# Patient Record
Sex: Male | Born: 1945 | Race: White | Hispanic: No | Marital: Married | Smoking: Former smoker
Health system: Southern US, Community
[De-identification: ages and names within clinical notes are randomized; demographics above are authoritative.]

## PROBLEM LIST (undated history)

## (undated) DIAGNOSIS — M5136 Other intervertebral disc degeneration, lumbar region: Secondary | ICD-10-CM

## (undated) DIAGNOSIS — G8929 Other chronic pain: Secondary | ICD-10-CM

## (undated) DIAGNOSIS — M47812 Spondylosis without myelopathy or radiculopathy, cervical region: Secondary | ICD-10-CM

## (undated) DIAGNOSIS — J984 Other disorders of lung: Secondary | ICD-10-CM

## (undated) DIAGNOSIS — M549 Dorsalgia, unspecified: Principal | ICD-10-CM

## (undated) DIAGNOSIS — I6529 Occlusion and stenosis of unspecified carotid artery: Secondary | ICD-10-CM

## (undated) DIAGNOSIS — F17201 Nicotine dependence, unspecified, in remission: Secondary | ICD-10-CM

## (undated) DIAGNOSIS — I4891 Unspecified atrial fibrillation: Secondary | ICD-10-CM

## (undated) DIAGNOSIS — M51369 Other intervertebral disc degeneration, lumbar region without mention of lumbar back pain or lower extremity pain: Secondary | ICD-10-CM

## (undated) DIAGNOSIS — M069 Rheumatoid arthritis, unspecified: Secondary | ICD-10-CM

## (undated) DIAGNOSIS — K449 Diaphragmatic hernia without obstruction or gangrene: Secondary | ICD-10-CM

## (undated) DIAGNOSIS — I2510 Atherosclerotic heart disease of native coronary artery without angina pectoris: Secondary | ICD-10-CM

## (undated) DIAGNOSIS — K220 Achalasia of cardia: Secondary | ICD-10-CM

## (undated) DIAGNOSIS — E039 Hypothyroidism, unspecified: Secondary | ICD-10-CM

## (undated) DIAGNOSIS — I679 Cerebrovascular disease, unspecified: Secondary | ICD-10-CM

## (undated) DIAGNOSIS — N200 Calculus of kidney: Secondary | ICD-10-CM

## (undated) DIAGNOSIS — Z8619 Personal history of other infectious and parasitic diseases: Secondary | ICD-10-CM

## (undated) DIAGNOSIS — J189 Pneumonia, unspecified organism: Secondary | ICD-10-CM

## (undated) DIAGNOSIS — I739 Peripheral vascular disease, unspecified: Secondary | ICD-10-CM

## (undated) DIAGNOSIS — I10 Essential (primary) hypertension: Secondary | ICD-10-CM

## (undated) DIAGNOSIS — E785 Hyperlipidemia, unspecified: Secondary | ICD-10-CM

## (undated) DIAGNOSIS — K219 Gastro-esophageal reflux disease without esophagitis: Secondary | ICD-10-CM

## (undated) DIAGNOSIS — K222 Esophageal obstruction: Secondary | ICD-10-CM

## (undated) HISTORY — DX: Nicotine dependence, unspecified, in remission: F17.201

## (undated) HISTORY — DX: Calculus of kidney: N20.0

## (undated) HISTORY — DX: Peripheral vascular disease, unspecified: I73.9

## (undated) HISTORY — PX: WRIST FUSION: SHX839

## (undated) HISTORY — DX: Personal history of other infectious and parasitic diseases: Z86.19

## (undated) HISTORY — PX: ANKLE FUSION: SHX881

## (undated) HISTORY — PX: SHOULDER SURGERY: SHX246

## (undated) HISTORY — DX: Unspecified atrial fibrillation: I48.91

## (undated) HISTORY — DX: Diaphragmatic hernia without obstruction or gangrene: K44.9

## (undated) HISTORY — DX: Gastro-esophageal reflux disease without esophagitis: K21.9

## (undated) HISTORY — DX: Achalasia of cardia: K22.0

## (undated) HISTORY — DX: Spondylosis without myelopathy or radiculopathy, cervical region: M47.812

## (undated) HISTORY — PX: CARDIAC SURGERY: SHX584

## (undated) HISTORY — DX: Occlusion and stenosis of unspecified carotid artery: I65.29

## (undated) HISTORY — DX: Hypothyroidism, unspecified: E03.9

## (undated) HISTORY — DX: Cerebrovascular disease, unspecified: I67.9

## (undated) HISTORY — PX: CORONARY ARTERY BYPASS GRAFT: SHX141

## (undated) HISTORY — DX: Hyperlipidemia, unspecified: E78.5

## (undated) HISTORY — DX: Other disorders of lung: J98.4

## (undated) HISTORY — DX: Essential (primary) hypertension: I10

## (undated) HISTORY — DX: Atherosclerotic heart disease of native coronary artery without angina pectoris: I25.10

## (undated) HISTORY — DX: Rheumatoid arthritis, unspecified: M06.9

## (undated) HISTORY — PX: TOTAL KNEE ARTHROPLASTY: SHX125

## (undated) HISTORY — DX: Esophageal obstruction: K22.2

## (undated) SURGERY — EGD (ESOPHAGOGASTRODUODENOSCOPY)
Anesthesia: Moderate Sedation

---

## 1997-09-23 HISTORY — PX: JOINT REPLACEMENT: SHX530

## 1998-02-09 ENCOUNTER — Inpatient Hospital Stay (HOSPITAL_COMMUNITY): Admission: RE | Admit: 1998-02-09 | Discharge: 1998-02-14 | Payer: Self-pay | Admitting: Orthopedic Surgery

## 1998-02-17 ENCOUNTER — Other Ambulatory Visit: Admission: RE | Admit: 1998-02-17 | Discharge: 1998-02-17 | Payer: Self-pay | Admitting: Orthopedic Surgery

## 1998-02-23 ENCOUNTER — Other Ambulatory Visit: Admission: RE | Admit: 1998-02-23 | Discharge: 1998-02-23 | Payer: Self-pay | Admitting: Orthopedic Surgery

## 1998-03-02 ENCOUNTER — Other Ambulatory Visit: Admission: RE | Admit: 1998-03-02 | Discharge: 1998-03-02 | Payer: Self-pay | Admitting: Orthopedic Surgery

## 1998-04-17 ENCOUNTER — Ambulatory Visit (HOSPITAL_COMMUNITY): Admission: RE | Admit: 1998-04-17 | Discharge: 1998-04-17 | Payer: Self-pay | Admitting: Interventional Cardiology

## 2001-12-22 ENCOUNTER — Ambulatory Visit (HOSPITAL_COMMUNITY): Admission: RE | Admit: 2001-12-22 | Discharge: 2001-12-22 | Payer: Self-pay | Admitting: Internal Medicine

## 2003-06-30 ENCOUNTER — Encounter: Payer: Self-pay | Admitting: Family Medicine

## 2003-06-30 ENCOUNTER — Ambulatory Visit (HOSPITAL_COMMUNITY): Admission: RE | Admit: 2003-06-30 | Discharge: 2003-06-30 | Payer: Self-pay | Admitting: Family Medicine

## 2005-10-07 ENCOUNTER — Encounter: Admission: RE | Admit: 2005-10-07 | Discharge: 2005-10-07 | Payer: Self-pay | Admitting: Rheumatology

## 2005-12-23 ENCOUNTER — Ambulatory Visit: Payer: Self-pay | Admitting: Cardiology

## 2005-12-23 ENCOUNTER — Inpatient Hospital Stay (HOSPITAL_COMMUNITY): Admission: EM | Admit: 2005-12-23 | Discharge: 2005-12-25 | Payer: Self-pay | Admitting: Emergency Medicine

## 2007-01-20 ENCOUNTER — Ambulatory Visit (HOSPITAL_COMMUNITY): Admission: RE | Admit: 2007-01-20 | Discharge: 2007-01-20 | Payer: Self-pay | Admitting: Family Medicine

## 2007-08-31 ENCOUNTER — Ambulatory Visit: Payer: Self-pay | Admitting: Cardiology

## 2007-09-10 ENCOUNTER — Emergency Department (HOSPITAL_COMMUNITY): Admission: EM | Admit: 2007-09-10 | Discharge: 2007-09-10 | Payer: Self-pay | Admitting: Emergency Medicine

## 2007-10-08 ENCOUNTER — Ambulatory Visit (HOSPITAL_COMMUNITY): Admission: RE | Admit: 2007-10-08 | Discharge: 2007-10-08 | Payer: Self-pay | Admitting: Family Medicine

## 2007-10-29 ENCOUNTER — Ambulatory Visit (HOSPITAL_COMMUNITY): Admission: RE | Admit: 2007-10-29 | Discharge: 2007-10-29 | Payer: Self-pay | Admitting: Cardiology

## 2007-10-29 ENCOUNTER — Ambulatory Visit: Payer: Self-pay | Admitting: Cardiology

## 2007-11-02 ENCOUNTER — Inpatient Hospital Stay (HOSPITAL_BASED_OUTPATIENT_CLINIC_OR_DEPARTMENT_OTHER): Admission: RE | Admit: 2007-11-02 | Discharge: 2007-11-02 | Payer: Self-pay | Admitting: Cardiology

## 2007-11-02 ENCOUNTER — Ambulatory Visit: Payer: Self-pay | Admitting: Cardiology

## 2007-11-09 ENCOUNTER — Ambulatory Visit: Payer: Self-pay | Admitting: Surgery

## 2007-11-17 ENCOUNTER — Ambulatory Visit: Payer: Self-pay | Admitting: Surgery

## 2007-11-17 ENCOUNTER — Ambulatory Visit (HOSPITAL_COMMUNITY): Admission: RE | Admit: 2007-11-17 | Discharge: 2007-11-17 | Payer: Self-pay | Admitting: Surgery

## 2007-11-26 ENCOUNTER — Ambulatory Visit: Payer: Self-pay | Admitting: Cardiology

## 2007-11-30 ENCOUNTER — Ambulatory Visit: Payer: Self-pay | Admitting: Surgery

## 2007-12-14 ENCOUNTER — Ambulatory Visit: Payer: Self-pay | Admitting: Surgery

## 2008-01-26 ENCOUNTER — Ambulatory Visit: Payer: Self-pay | Admitting: Cardiology

## 2008-02-03 ENCOUNTER — Ambulatory Visit (HOSPITAL_COMMUNITY): Admission: RE | Admit: 2008-02-03 | Discharge: 2008-02-03 | Payer: Self-pay | Admitting: Cardiology

## 2008-02-03 ENCOUNTER — Encounter: Payer: Self-pay | Admitting: Cardiology

## 2008-02-23 ENCOUNTER — Ambulatory Visit: Payer: Self-pay | Admitting: Cardiology

## 2008-03-04 ENCOUNTER — Ambulatory Visit: Payer: Self-pay | Admitting: Cardiology

## 2008-03-05 ENCOUNTER — Emergency Department (HOSPITAL_COMMUNITY): Admission: EM | Admit: 2008-03-05 | Discharge: 2008-03-05 | Payer: Self-pay | Admitting: Emergency Medicine

## 2008-03-05 ENCOUNTER — Encounter: Payer: Self-pay | Admitting: Critical Care Medicine

## 2008-03-07 ENCOUNTER — Ambulatory Visit: Payer: Self-pay | Admitting: Critical Care Medicine

## 2008-03-07 DIAGNOSIS — E785 Hyperlipidemia, unspecified: Secondary | ICD-10-CM

## 2008-03-07 DIAGNOSIS — J309 Allergic rhinitis, unspecified: Secondary | ICD-10-CM | POA: Insufficient documentation

## 2008-03-14 ENCOUNTER — Telehealth: Payer: Self-pay | Admitting: Critical Care Medicine

## 2008-03-15 ENCOUNTER — Telehealth: Payer: Self-pay | Admitting: Critical Care Medicine

## 2008-03-23 ENCOUNTER — Encounter: Payer: Self-pay | Admitting: Critical Care Medicine

## 2008-03-23 ENCOUNTER — Ambulatory Visit: Payer: Self-pay | Admitting: Critical Care Medicine

## 2008-04-04 ENCOUNTER — Encounter: Payer: Self-pay | Admitting: Critical Care Medicine

## 2008-04-11 ENCOUNTER — Ambulatory Visit: Payer: Self-pay | Admitting: Critical Care Medicine

## 2008-05-05 ENCOUNTER — Encounter: Payer: Self-pay | Admitting: Critical Care Medicine

## 2008-06-27 ENCOUNTER — Ambulatory Visit: Payer: Self-pay | Admitting: Surgery

## 2008-07-20 ENCOUNTER — Ambulatory Visit: Payer: Self-pay | Admitting: Critical Care Medicine

## 2008-07-27 ENCOUNTER — Encounter: Payer: Self-pay | Admitting: Critical Care Medicine

## 2008-09-23 DIAGNOSIS — N200 Calculus of kidney: Secondary | ICD-10-CM

## 2008-09-23 HISTORY — DX: Calculus of kidney: N20.0

## 2008-09-26 ENCOUNTER — Encounter: Payer: Self-pay | Admitting: Internal Medicine

## 2008-12-15 ENCOUNTER — Ambulatory Visit: Payer: Self-pay | Admitting: Cardiology

## 2009-05-11 ENCOUNTER — Ambulatory Visit: Payer: Self-pay | Admitting: Urology

## 2009-07-10 ENCOUNTER — Ambulatory Visit: Payer: Self-pay | Admitting: Surgery

## 2009-12-01 ENCOUNTER — Encounter (INDEPENDENT_AMBULATORY_CARE_PROVIDER_SITE_OTHER): Payer: Self-pay | Admitting: *Deleted

## 2009-12-01 ENCOUNTER — Encounter: Payer: Self-pay | Admitting: Cardiology

## 2009-12-01 LAB — CONVERTED CEMR LAB
ALT: 39 units/L
AST: 38 units/L
Alkaline Phosphatase: 53 units/L
BUN: 16 mg/dL
CO2: 23 meq/L
Cholesterol: 185 mg/dL
Glucose, Bld: 96 mg/dL
HDL: 52 mg/dL
Potassium: 3.4 meq/L
Sodium: 142 meq/L

## 2009-12-04 ENCOUNTER — Encounter (INDEPENDENT_AMBULATORY_CARE_PROVIDER_SITE_OTHER): Payer: Self-pay | Admitting: *Deleted

## 2009-12-04 ENCOUNTER — Encounter: Payer: Self-pay | Admitting: Cardiology

## 2009-12-04 LAB — CONVERTED CEMR LAB
ALT: 39 units/L (ref 0–53)
AST: 38 units/L — ABNORMAL HIGH (ref 0–37)
Albumin: 4.3 g/dL (ref 3.5–5.2)
Alkaline Phosphatase: 53 units/L (ref 39–117)
BUN: 16 mg/dL (ref 6–23)
CO2: 23 meq/L (ref 19–32)
Calcium: 9.5 mg/dL (ref 8.4–10.5)
Chloride: 107 meq/L (ref 96–112)
Glucose, Bld: 96 mg/dL (ref 70–99)
Potassium: 3.4 meq/L — ABNORMAL LOW (ref 3.5–5.3)
VLDL: 66 mg/dL — ABNORMAL HIGH (ref 0–40)

## 2010-01-24 ENCOUNTER — Encounter (INDEPENDENT_AMBULATORY_CARE_PROVIDER_SITE_OTHER): Payer: Self-pay | Admitting: *Deleted

## 2010-01-24 LAB — CONVERTED CEMR LAB
GFR calc non Af Amer: 9.5 mL/min
Glucose, Bld: 176 mg/dL — ABNORMAL HIGH (ref 70–99)
Potassium: 4.9 meq/L
Potassium: 4.9 meq/L (ref 3.5–5.3)
Sodium: 142 meq/L
Sodium: 142 meq/L (ref 135–145)

## 2010-01-26 ENCOUNTER — Inpatient Hospital Stay (HOSPITAL_COMMUNITY): Admission: EM | Admit: 2010-01-26 | Discharge: 2010-01-27 | Payer: Self-pay | Admitting: Emergency Medicine

## 2010-01-29 ENCOUNTER — Encounter (INDEPENDENT_AMBULATORY_CARE_PROVIDER_SITE_OTHER): Payer: Self-pay | Admitting: *Deleted

## 2010-02-06 ENCOUNTER — Encounter (INDEPENDENT_AMBULATORY_CARE_PROVIDER_SITE_OTHER): Payer: Self-pay | Admitting: *Deleted

## 2010-03-12 ENCOUNTER — Encounter (INDEPENDENT_AMBULATORY_CARE_PROVIDER_SITE_OTHER): Payer: Self-pay | Admitting: *Deleted

## 2010-03-18 ENCOUNTER — Encounter: Payer: Self-pay | Admitting: Cardiology

## 2010-03-18 DIAGNOSIS — I739 Peripheral vascular disease, unspecified: Secondary | ICD-10-CM

## 2010-03-18 DIAGNOSIS — K219 Gastro-esophageal reflux disease without esophagitis: Secondary | ICD-10-CM

## 2010-03-18 DIAGNOSIS — E039 Hypothyroidism, unspecified: Secondary | ICD-10-CM

## 2010-03-18 DIAGNOSIS — I2510 Atherosclerotic heart disease of native coronary artery without angina pectoris: Secondary | ICD-10-CM

## 2010-03-19 ENCOUNTER — Ambulatory Visit: Payer: Self-pay | Admitting: Cardiology

## 2010-04-19 ENCOUNTER — Ambulatory Visit: Payer: Self-pay | Admitting: Cardiology

## 2010-04-23 ENCOUNTER — Encounter: Payer: Self-pay | Admitting: Cardiology

## 2010-04-30 ENCOUNTER — Encounter (INDEPENDENT_AMBULATORY_CARE_PROVIDER_SITE_OTHER): Payer: Self-pay | Admitting: *Deleted

## 2010-05-03 ENCOUNTER — Encounter (HOSPITAL_COMMUNITY): Admission: RE | Admit: 2010-05-03 | Discharge: 2010-06-02 | Payer: Self-pay | Admitting: Family Medicine

## 2010-05-03 ENCOUNTER — Ambulatory Visit (HOSPITAL_COMMUNITY): Payer: Self-pay | Admitting: Family Medicine

## 2010-06-14 ENCOUNTER — Encounter (HOSPITAL_COMMUNITY)
Admission: RE | Admit: 2010-06-14 | Discharge: 2010-06-22 | Payer: Self-pay | Source: Home / Self Care | Admitting: Family Medicine

## 2010-06-14 ENCOUNTER — Ambulatory Visit (HOSPITAL_COMMUNITY): Payer: Self-pay | Admitting: Family Medicine

## 2010-08-01 LAB — CONVERTED CEMR LAB
BUN: 21 mg/dL (ref 6–23)
Calcium: 9.9 mg/dL (ref 8.4–10.5)
Creatinine, Ser: 0.84 mg/dL (ref 0.40–1.50)
Glucose, Bld: 155 mg/dL — ABNORMAL HIGH (ref 70–99)
Potassium: 4.4 meq/L (ref 3.5–5.3)

## 2010-08-09 ENCOUNTER — Ambulatory Visit (HOSPITAL_COMMUNITY): Payer: Self-pay | Admitting: Family Medicine

## 2010-08-09 ENCOUNTER — Encounter (HOSPITAL_COMMUNITY)
Admission: RE | Admit: 2010-08-09 | Discharge: 2010-09-08 | Payer: Self-pay | Source: Home / Self Care | Attending: Family Medicine | Admitting: Family Medicine

## 2010-08-23 ENCOUNTER — Ambulatory Visit: Payer: Self-pay | Admitting: Surgery

## 2010-09-19 ENCOUNTER — Ambulatory Visit (HOSPITAL_COMMUNITY)
Admission: RE | Admit: 2010-09-19 | Discharge: 2010-09-19 | Payer: Self-pay | Source: Home / Self Care | Attending: Family Medicine | Admitting: Family Medicine

## 2010-09-20 ENCOUNTER — Ambulatory Visit (HOSPITAL_COMMUNITY)
Admission: RE | Admit: 2010-09-20 | Discharge: 2010-09-20 | Payer: Self-pay | Source: Home / Self Care | Attending: Family Medicine | Admitting: Family Medicine

## 2010-09-25 ENCOUNTER — Ambulatory Visit (HOSPITAL_COMMUNITY)
Admission: RE | Admit: 2010-09-25 | Discharge: 2010-09-25 | Payer: Self-pay | Source: Home / Self Care | Attending: Family Medicine | Admitting: Family Medicine

## 2010-09-26 ENCOUNTER — Observation Stay (HOSPITAL_COMMUNITY)
Admission: EM | Admit: 2010-09-26 | Discharge: 2010-09-27 | Payer: Self-pay | Source: Home / Self Care | Attending: Internal Medicine | Admitting: Internal Medicine

## 2010-09-26 LAB — COMPREHENSIVE METABOLIC PANEL
ALT: 35 U/L (ref 0–53)
AST: 38 U/L — ABNORMAL HIGH (ref 0–37)
Albumin: 3.9 g/dL (ref 3.5–5.2)
Alkaline Phosphatase: 57 U/L (ref 39–117)
BUN: 15 mg/dL (ref 6–23)
CO2: 22 mEq/L (ref 19–32)
Calcium: 8.9 mg/dL (ref 8.4–10.5)
Chloride: 109 mEq/L (ref 96–112)
Creatinine, Ser: 0.89 mg/dL (ref 0.4–1.5)
GFR calc Af Amer: >60 mL/min (ref >60)
GFR calc non Af Amer: >60 mL/min (ref >60)
Glucose, Bld: 93 mg/dL (ref 70–99)
Potassium: 3.9 mEq/L (ref 3.5–5.1)
Sodium: 141 mEq/L (ref 135–145)
Total Bilirubin: 0.6 mg/dL (ref 0.3–1.2)
Total Protein: 6.7 g/dL (ref 6.0–8.3)

## 2010-09-26 LAB — DIFFERENTIAL
Basophils Absolute: 0 10*3/uL (ref 0.0–0.1)
Basophils Relative: 0 % (ref 0–1)
Eosinophils Absolute: 0.1 10*3/uL (ref 0.0–0.7)
Eosinophils Relative: 1 % (ref 0–5)
Lymphocytes Relative: 28 % (ref 12–46)
Lymphs Abs: 3.1 10*3/uL (ref 0.7–4.0)
Monocytes Absolute: 1.6 10*3/uL — ABNORMAL HIGH (ref 0.1–1.0)
Monocytes Relative: 14 % — ABNORMAL HIGH (ref 3–12)
Neutro Abs: 6.3 10*3/uL (ref 1.7–7.7)
Neutrophils Relative %: 56 % (ref 43–77)

## 2010-09-26 LAB — CBC
HCT: 44.7 % (ref 39.0–52.0)
Hemoglobin: 15.2 g/dL (ref 13.0–17.0)
MCH: 31.9 pg (ref 26.0–34.0)
MCHC: 34 g/dL (ref 30.0–36.0)
MCV: 93.9 fL (ref 78.0–100.0)
Platelets: 185 10*3/uL (ref 150–400)
RBC: 4.76 MIL/uL (ref 4.22–5.81)
RDW: 13.3 % (ref 11.5–15.5)
WBC: 11.2 10*3/uL — ABNORMAL HIGH (ref 4.0–10.5)

## 2010-09-26 LAB — POCT CARDIAC MARKERS
CKMB, poc: 1.6 ng/mL (ref 1.0–8.0)
Myoglobin, poc: 64.5 ng/mL (ref 12–200)
Troponin i, poc: <0.05 ng/mL (ref 0.00–0.09)

## 2010-09-26 LAB — BRAIN NATRIURETIC PEPTIDE: Pro B Natriuretic peptide (BNP): 46 pg/mL (ref 0.0–100.0)

## 2010-09-27 ENCOUNTER — Other Ambulatory Visit: Payer: Self-pay | Admitting: Internal Medicine

## 2010-09-27 LAB — COMPREHENSIVE METABOLIC PANEL
ALT: 33 U/L (ref 0–53)
AST: 37 U/L (ref 0–37)
Albumin: 3.4 g/dL — ABNORMAL LOW (ref 3.5–5.2)
Alkaline Phosphatase: 52 U/L (ref 39–117)
BUN: 13 mg/dL (ref 6–23)
CO2: 27 mEq/L (ref 19–32)
Calcium: 9 mg/dL (ref 8.4–10.5)
Chloride: 107 mEq/L (ref 96–112)
Creatinine, Ser: 0.91 mg/dL (ref 0.4–1.5)
GFR calc Af Amer: >60 mL/min (ref >60)
GFR calc non Af Amer: >60 mL/min (ref >60)
Glucose, Bld: 114 mg/dL — ABNORMAL HIGH (ref 70–99)
Potassium: 3.8 mEq/L (ref 3.5–5.1)
Sodium: 141 mEq/L (ref 135–145)
Total Bilirubin: 0.5 mg/dL (ref 0.3–1.2)
Total Protein: 6.3 g/dL (ref 6.0–8.3)

## 2010-09-27 LAB — URINALYSIS, ROUTINE W REFLEX MICROSCOPIC
Bilirubin Urine: NEGATIVE
Hemoglobin, Urine: NEGATIVE
Ketones, ur: NEGATIVE mg/dL
Nitrite: NEGATIVE
Protein, ur: NEGATIVE mg/dL
Specific Gravity, Urine: >1.046 — ABNORMAL HIGH (ref 1.005–1.030)
Urine Glucose, Fasting: NEGATIVE mg/dL
Urobilinogen, UA: 0.2 mg/dL (ref 0.0–1.0)
pH: 6.5 (ref 5.0–8.0)

## 2010-09-27 LAB — CARDIAC PANEL(CRET KIN+CKTOT+MB+TROPI)
CK, MB: 2 ng/mL (ref 0.3–4.0)
CK, MB: 2.2 ng/mL (ref 0.3–4.0)
Relative Index: INVALID (ref 0.0–2.5)
Relative Index: INVALID (ref 0.0–2.5)
Total CK: 75 U/L (ref 7–232)
Total CK: 77 U/L (ref 7–232)
Troponin I: 0.02 ng/mL (ref 0.00–0.06)
Troponin I: 0.02 ng/mL (ref 0.00–0.06)

## 2010-09-27 LAB — HEMOGLOBIN A1C
Hgb A1c MFr Bld: 6.5 % — ABNORMAL HIGH (ref <5.7)
Mean Plasma Glucose: 140 mg/dL — ABNORMAL HIGH (ref <117)

## 2010-09-27 LAB — TSH: TSH: 0.413 u[IU]/mL (ref 0.350–4.500)

## 2010-09-27 LAB — LIPID PANEL
Cholesterol: 189 mg/dL (ref 0–200)
HDL: 47 mg/dL (ref >39)
LDL Cholesterol: 80 mg/dL (ref 0–99)
Total CHOL/HDL Ratio: 4 RATIO
Triglycerides: 309 mg/dL — ABNORMAL HIGH (ref <150)
VLDL: 62 mg/dL — ABNORMAL HIGH (ref 0–40)

## 2010-09-27 LAB — CK TOTAL AND CKMB (NOT AT ARMC)
CK, MB: 2.4 ng/mL (ref 0.3–4.0)
Relative Index: INVALID (ref 0.0–2.5)
Total CK: 71 U/L (ref 7–232)

## 2010-09-27 LAB — MAGNESIUM: Magnesium: 2.3 mg/dL (ref 1.5–2.5)

## 2010-09-27 LAB — PHOSPHORUS: Phosphorus: 4.4 mg/dL (ref 2.3–4.6)

## 2010-09-27 LAB — TROPONIN I: Troponin I: 0.03 ng/mL (ref 0.00–0.06)

## 2010-10-04 ENCOUNTER — Ambulatory Visit (HOSPITAL_COMMUNITY)
Admission: RE | Admit: 2010-10-04 | Discharge: 2010-10-23 | Payer: Self-pay | Source: Home / Self Care | Attending: Rheumatology | Admitting: Rheumatology

## 2010-10-04 ENCOUNTER — Encounter (HOSPITAL_COMMUNITY)
Admission: RE | Admit: 2010-10-04 | Discharge: 2010-10-23 | Payer: Self-pay | Source: Home / Self Care | Attending: Family Medicine | Admitting: Family Medicine

## 2010-10-20 NOTE — H&P (Signed)
[name], [name]                  ACCOUNT NO.:  [id number]  MEDICAL RECORD NO.:  [id number]          PATIENT TYPE:  OBV  LOCATION:  1827                         FACILITY:  [facility]  PHYSICIAN:  Michiel Cowboy, MDDATE OF BIRTH:  Nov 05, 1945  DATE OF ADMISSION:  09/26/2010 DATE OF DISCHARGE:                             HISTORY & PHYSICAL   PRIMARY CARE PROVIDER:  None locally.  He does go to Dr. Simone Curia at [place].  The patient belongs to [facility].  CHIEF COMPLAINT:  Chest pain.  The patient is a 65 year old gentleman with past medical history of coronary artery disease, status post CABG with a history of rheumatoid arthritis and interstitial lung disease.  The patient was doing today okay up until about 9 p.m. when he developed sudden onset of chest pressure after he had burped and since then have had a lot of heartburn and burning sensation in the matter of his chest. The chest pressure has completely resolved.  Currently, it was nonexertional.  He has been having some cough for the past day or so and ever since his episode.  His wife also reports that he had often fall asleep in his recliner with cans in his mouth and then wakes up choking. He has had a lot of acid reflux.  Otherwise review of systems are negative.  He is currently chest pain free.  He had some shortness of breath associated with this episode, but currently this resolved.  He also had mild nausea associated with this, but also completely resolved.  Review of systems is otherwise negative.  PAST MEDICAL HISTORY:  Significant for: 1. Coronary artery disease. 2. Rheumatoid arthritis. 3. Hypercholesteremia. 4. Hypertension. 5. Kidney stones. 6. History of knee replacement and joint surgery secondary to     rheumatoid arthritis. 7. The patient has been told in the past that he has some lung     disease.  SOCIAL HISTORY:  The patient is a former smoker, but quit many years ago.   Does not drink or abuse drugs.  He is married.  Lives with his wife.  FAMILY HISTORY:  Noncontributory.  ALLERGIES: 1. DOXYCYCLINE causes him to go crazy. 2. LEVAQUIN  causes a lot of GI cramps.  MEDICATIONS: 1. Prednisone 10 mg daily. 2. Ibuprofen 800 mg p.o. b.i.d. 3. Synthroid 112 mcg daily. 4. Baby aspirin daily. 5. Fish oil 1200 mg daily. 6. Niacin 1500 mg daily. 7. Atenolol 25 mg daily. 8. Simvastatin 80 mg daily. 9. Lisinopril 20 mg daily. 10.Folic acid 1 mg daily. 11.Prilosec daily. 12.Vitamin D 400 mg daily. 13.Remicade shots one per month.  PHYSICAL EXAMINATION:  VITAL SIGNS:  Temperature 98.6, blood pressure 132/72, pulse 69, respiration 18, satting 100% on room air. GENERAL:  The patient appears to be in no acute distress. HEENT:  Head nontraumatic.  Moist mucous membranes. LUNGS:  With occasional fine crackles at the bases, left somewhat worse than right.  No wheezes appreciated. HEART:  Regular rate and rhythm.  No murmurs appreciated. ABDOMEN:  Soft, nontender and nondistended. EXTREMITIES:  Without clubbing, cyanosis or edema.  His wife is concerned that  the edema into his left leg, but I cannot appreciated. There is deformity of his right wrist as well as left ankle. SKIN:  Significant for nodule which may be rheumatoid nodule on his left upper back.  LABORATORY DATA:  White blood cell count 11.2, hemoglobin 15.2.  Sodium 141, potassium 3.9, creatinine 0.89, AST 38, albumin 3.9.  Cardiac enzymes negative.  BNP 46.  CT angiogram of the chest showing no PE with mild interstitial lung disease with honeycombing, no evidence of pneumonia.  Chest x-ray done previously was concerning for pneumonia versus atelectasis which was not confirmed on a chest CT.  EKG; sinus rhythm, heart rate 72, no ischemic changes noted.  Overall unchanged from EKG done in May 2011.  ASSESSMENT AND PLAN:  This is a 65 year old gentleman with history of coronary artery disease,  chest pain that was somewhat typical and maybe more of a gastrointestinal related, but though it is may be given his history, no cycle cardiac enzymes.  We will check an echo to evaluate for any wall motion abnormalities.  He does not have a pulmonary embolism.  We will put him on Protonix, continue his aspirin, beta- blocker and lisinopril.  We will further risk stratify with fasting lipid panel and hemoglobin A1c.  Significant gastroesophageal reflux disease with possible reflux and possibility of resultant esophagitis with may have explained his symptoms.  We will start him on Protonix.  He probably would need to have evaluation of this and follow up with Gastroenterology.  We will also give gastrointestinal cocktail and see if it helps his current symptoms.  Interstitial lung disease.  This is fairly mild.  He was told at some point it was the result of the methotrexate, also chronic gastroesophageal reflux disease and acid reflux with possibility of chronic mild aspiration could resultant similar findings.  We will make sure he has albuterol p.r.n. and Atrovent.  He is already on steroids for his rheumatoid arthritis.  Consider adding Advair.  Treat with Protonix and probably need to have PFTs done at some point once he recovers.  Rheumatoid nephritis, stable.  Continue prednisone.  Prophylaxis.  Protonix and SCDs.     Michiel Cowboy, MD     AVD/MEDQ  D:  09/27/2010  T:  09/27/2010  Job:  [id number]  cc:   Donna Bernard, M.D.  Electronically Signed by Therisa Doyne MD on 10/20/2010 06:15:38 AM

## 2010-10-23 NOTE — Miscellaneous (Signed)
Summary: LABS BMP,01/24/2010,CMP,LIPIDS,12/01/2009  Clinical Lists Changes  Observations: Added new observation of GFR: 9.5 mL/min (01/24/2010 16:21) Added new observation of CREATININE: 0.93 mg/dL (96/29/5284 13:24) Added new observation of BUN: 23 mg/dL (40/06/2724 36:64) Added new observation of BG RANDOM: 176 mg/dL (40/34/7425 95:63) Added new observation of CO2 PLSM/SER: 20 meq/L (01/24/2010 16:21) Added new observation of CL SERUM: 107 meq/L (01/24/2010 16:21) Added new observation of K SERUM: 4.9 meq/L (01/24/2010 16:21) Added new observation of NA: 142 meq/L (01/24/2010 16:21) Added new observation of CALCIUM: 9.5 mg/dL (87/56/4332 95:18) Added new observation of ALBUMIN: 4.3 g/dL (84/16/6063 01:60) Added new observation of PROTEIN, TOT: 6.7 g/dL (10/93/2355 73:22) Added new observation of SGPT (ALT): 39 units/L (12/01/2009 16:21) Added new observation of SGOT (AST): 38 units/L (12/01/2009 16:21) Added new observation of ALK PHOS: 53 units/L (12/01/2009 16:21) Added new observation of CREATININE: 0.87 mg/dL (02/54/2706 23:76) Added new observation of BUN: 16 mg/dL (28/31/5176 16:07) Added new observation of BG RANDOM: 96 mg/dL (37/06/6268 48:54) Added new observation of CO2 PLSM/SER: 23 meq/L (12/01/2009 16:21) Added new observation of CL SERUM: 107 meq/L (12/01/2009 16:21) Added new observation of K SERUM: 3.4 meq/L (12/01/2009 16:21) Added new observation of NA: 142 meq/L (12/01/2009 16:21) Added new observation of LDL: 67 mg/dL (62/70/3500 93:81) Added new observation of HDL: 52 mg/dL (82/99/3716 96:78) Added new observation of TRIGLYC TOT: 331 mg/dL (93/81/0175 10:25) Added new observation of CHOLESTEROL: 185 mg/dL (85/27/7824 23:53)

## 2010-10-23 NOTE — Letter (Signed)
Summary: Appointment - Reminder 2  [facility] [place]. [address] [place] [address]   Phone: [phone]  Fax: [phone]     Feb 06, 2010 MRN: [id number]   Jeydan Berrie [address] [place], [place]  [address]   Dear Mr. JOPLIN,  Our records indicate that it is time to schedule a follow-up appointment.  Dr.     [name]     recommended that you follow up with [place] in        11/2009    . It is very important that we reach you to schedule this appointment. We look forward to participating in your health care needs. Please contact us at the number listed above at your earliest convenience to schedule your appointment.  If you are unable to make an appointment at this time, give [place] a call so we can update our records.     Sincerely,   Glass blower/designer

## 2010-10-23 NOTE — Letter (Signed)
Summary: [place] Results Engineer, agricultural at [facility]  [address] [place] [address]   Phone: [phone]  Fax: [phone]      Jan 29, 2010 MRN: [id number]   [name] [address] [place], [place]  [address]   Dear Mr. KRACH,  Your test ordered by Selena Batten has been reviewed by your physician (or physician assistant) and was found to be normal or stable. Your physician (or physician assistant) felt no changes were needed at this time.  ____ Echocardiogram  ____ Cardiac Stress Test  __x__ Lab Work  ____ Peripheral vascular study of arms, legs or neck  ____ CT scan or X-ray  ____ Lung or Breathing test  ____ Other: No change in medical treatment at this time, per Dr. [name].  Enclosed is a copy of your labwork for your records.   Thank you, Byrant Valent Allyne Gee RN    Ogilvie Bing, MD, Lenise Arena.C.Gaylord Shih, MD, F.A.C.C Lewayne Bunting, MD, F.A.C.C Nona Dell, MD, F.A.C.C Charlton Haws, MD, Lenise Arena.C.C

## 2010-10-23 NOTE — Letter (Signed)
Summary: [name] Lab Work Engineer, agricultural at [facility]  [address] [place] [address]   Phone: [phone]  Fax: [phone]     April 30, 2010 MRN: [id number]   [name] [address] [place], [place]  [address]      YOUR LAB WORK IS DUE   July 31, 2010  Please go to [facility], located across the street from [facility] on the second floor.  Hours are Monday - Friday 7am until 7:30pm         Saturday 8am until 12noon    __  DO NOT EAT OR DRINK AFTER MIDNIGHT EVENING PRIOR TO LABWORK  _X_ YOUR LABWORK IS NOT FASTING --YOU MAY EAT PRIOR TO LABWORK

## 2010-10-23 NOTE — Assessment & Plan Note (Signed)
Summary: past due for f/u per reminder list/tg  Medications Added PREDNISONE 10 MG TABS (PREDNISONE) take 1 tablet poqd SIMVASTATIN 80 MG TABS (SIMVASTATIN) take 1 tablet by mouth at bedtime CO-GESIC 5-500 MG TABS (HYDROCODONE-ACETAMINOPHEN) 1 tab q 4 to 6 hours as needed FOLIC ACID 1 MG TABS (FOLIC ACID) 1 tab daily NIACIN CR 1000 MG CR-TABS (NIACIN) 3 tabs by mouth once daily FISH OIL 1000 MG CAPS (OMEGA-3 FATTY ACIDS) 1 tab daily PLAVIX 75 MG TABS (CLOPIDOGREL BISULFATE) 1 tablet by mouth daily TYLENOL 325 MG TABS (ACETAMINOPHEN) 2 tablets by mouth three times a day LISINOPRIL 20 MG TABS (LISINOPRIL) Take one tablet by mouth daily        Referring Provider:  . Primary Provider:  Dr. Lubertha South   History of Present Illness: [name] returns to the office beyond his scheduled one year visit for continuing assessment and treatment of coronary disease and cardiovascular risk factors.  Since he was last seen, he has done generally well.  He was hospitalized briefly on one occasion for an acute illness diagnosed as cellulitis.  A question of neurologic problems was also raised during that admission, and he is scheduled for evaluation by a neurologist.  Clopidogrel has been added to his medical regime.  He remains active with essentially no cardiopulmonary symptoms.  He experiences a sensation in his chest, which could represent mild dyspnea, at peak exertion.  He has had no chest pain.  He notes some dependent edema.  Rheumatoid arthritis is fairly well controlled, but it has been impossible for him to taper and discontinue steroids.  Treatment with a disease modifying agent is now being considered.      Current Medications (verified): 1)  Synthroid 125 Mcg  Tabs (Levothyroxine Sodium) .... Take One Tablet Daily. 2)  Prednisone 10 Mg Tabs (Prednisone) .... Take 1 Tablet Poqd 3)  Ibuprofen 800 Mg  Tabs (Ibuprofen) .Marland Kitchen.. 1 By Mouth Two Times A Day 4)  Atenolol 50 Mg  Tabs (Atenolol) ....  Take 1/2 Tablet Daily. 5)  Simvastatin 80 Mg Tabs (Simvastatin) .... Take 1 Tablet By Mouth At Bedtime 6)  Prilosec Otc 20 Mg Tbec (Omeprazole Magnesium) .... Once Daily 7)  D 400   Caps (Vitamins A & D) .... Take One Tablet Daily. 8)  Apap 500 Mg  Tabs (Acetaminophen) .... One By Mouth Four Times Daily 9)  Co-Gesic 5-500 Mg Tabs (Hydrocodone-Acetaminophen) .Marland Kitchen.. 1 Tab Q 4 To 6 Hours As Needed 10)  Folic Acid 1 Mg Tabs (Folic Acid) .Marland Kitchen.. 1 Tab Daily 11)  Niacin Cr 1000 Mg Cr-Tabs (Niacin) .... 3 Tabs By Mouth Once Daily 12)  Fish Oil 1000 Mg Caps (Omega-3 Fatty Acids) .Marland Kitchen.. 1 Tab Daily 13)  Plavix 75 Mg Tabs (Clopidogrel Bisulfate) .Marland Kitchen.. 1 Tablet By Mouth Daily 14)  Tylenol 325 Mg Tabs (Acetaminophen) .... 2 Tablets By Mouth Three Times A Day 15)  Lisinopril 20 Mg Tabs (Lisinopril) .... Take One Tablet By Mouth Daily  Allergies: 1)  ! Levaquin 2)  ! * Doxycycline Hyc  Past History:  PMH, FH, and Social History reviewed and updated.  Review of Systems       Please see history of present illness.  Vital Signs:  Patient profile:   65 year old male Height:      68 inches Weight:      185 pounds BMI:     28.23 O2 Sat:      96 % Pulse rate:   70 / minute  BP sitting:   131 / 72  (left arm)  Vitals Entered By: Larita Fife Via LPN (March 19, 2010 11:28 AM)  Physical Exam  General:  Somewhat overweight; well developed; no acute distress:   Neck-No JVD; bilateral carotid bruits: Lungs-No tachypnea, no rales; no rhonchi; no wheezes: Cardiovascular-normal PMI; normal S1 and S2: prominent fourth heart sound Abdomen-BS normal; soft and non-tender without masses or organomegaly:  Musculoskeletal-multiple joint deformities including left ankle and right wrist, no cyanosis or clubbing: Neurologic-Normal cranial nerves; symmetric strength and tone:  Skin-Warm, no significant lesions: Extremities: 1-2+ distal pulses; trace edema:     Impression & Recommendations:  Problem # 1:  ATHEROSCLEROTIC  CARDIOVASCULAR DISEASE (ICD-429.2) No symptoms at present to suggest myocardial ischemia.  Optimization of risk factors will be pursued.  Patient has carried  nitroglycerin for more than 10 years without ever using it and will cease to do so.  Problem # 2:  HYPERLIPIDEMIA (ICD-272.4)  Recent lipid profile is quite good except for elevated triglycerides.  A modification in medical regime is required due to concomitant use of high-dose simvastatin and amlodipine.  We will attempt to dispense with the latter agent.  Problem # 3:  HYPERTENSION (ICD-401.1) Blood pressure control is good, but amlodipine must be substituted.  Lisinopril 20 mg q.d. will be started.  He may well need low dose diuretic as well.  He will monitor blood pressure at home or in his local pharmacy and return to see the cardiology nurses in one month.  I will see him again in one year.  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  Aggrenox is the most aggressive medication I would consider for him.  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  Patient Instructions: 1)  Your physician recommends that you schedule a follow-up appointment in:  1 year 2)  Your physician has recommended you make the following change in your medication: stop amlodipine and start lisinopril 20mg  daily 3)  You have been referred to nurse visit in 1 month 4)  Your physician has requested that you regularly monitor and record your blood pressure readings at home.  Please use the same machine at the same time of day to check your readings and record them to bring to your follow-up visit. 5)  check bp daily and record bring bp diary to nurse visit call for bp >  140/90 Prescriptions: LISINOPRIL 20 MG TABS (LISINOPRIL) Take one tablet by mouth daily  #30 x 6   Entered by:   Teressa Lower RN   Authorized by:   Kathlen Brunswick, MD, [facility]   Signed by:   Teressa Lower RN on 03/19/2010   Method used:   Electronically to        [facility] [phone]* (retail)       [address]       [place], [place]  [address]       Ph: [phone] or [phone]       Fax: [phone]   RxID:   [id number]   Prevention & Chronic Care Immunizations   Influenza vaccine: Not documented    Tetanus booster: Not documented    Pneumococcal vaccine: Not documented    H. zoster vaccine:  Not documented  Colorectal Screening   Hemoccult: Not documented    Colonoscopy: Not documented  Other Screening   PSA: Not documented   Smoking status: quit  (03/07/2008)  Lipids   Total Cholesterol: 185  (12/01/2009)   LDL: 67  (12/01/2009)   LDL Direct: Not documented   HDL: 52  (12/01/2009)   Triglycerides: 331  (12/01/2009)    SGOT (AST): 38  (12/01/2009)   SGPT (ALT): 39  (12/01/2009)   Alkaline phosphatase: 53  (12/01/2009)   Total bilirubin: 0.2  (12/01/2009)  Hypertension   Last Blood Pressure: 131 / 72  (03/19/2010)   Serum creatinine: 0.93  (01/24/2010)   Serum potassium 4.9  (01/24/2010)  Self-Management Support :    Hypertension self-management support: Not documented    Lipid self-management support: Not documented

## 2010-10-23 NOTE — Miscellaneous (Signed)
Summary:  bmp orders  Clinical Lists Changes  Orders: Added new Test order of T-Basic Metabolic Panel [phone]) - Signed

## 2010-10-23 NOTE — Letter (Signed)
Summary: BP READINGS  BP READINGS   Imported By: Faythe Ghee 04/23/2010 10:30:38  _____________________________________________________________________  External Attachment:    Type:   Image     Comment:   External Document

## 2010-10-23 NOTE — Assessment & Plan Note (Signed)
Summary: nurse  Nurse Visit   Vital Signs:  Patient profile:   65 year old male Height:      68 inches Weight:      187 pounds O2 Sat:      97 % on Room air Pulse rate:   60 / minute BP sitting:   118 / 61  (left arm)  Vitals Entered By: Teressa Lower RN (April 19, 2010 8:48 AM)  O2 Flow:  Room air  Visit Type:  1 month nurse visit Referring Provider:  . Primary Provider:  Dr. Lubertha South   History of Present Illness: S: 1 month nurse visit B: ov 03/19/2010, stopped amlodipine and started lisinopril 20mg  daily, bp diary A: pt denies c/o, feeling good      57 bp readings, diary   scanned  average 76    average sbp   144         average dbp   76 R:I had placed them in your records files, but will be scanned in today 04/23/10   04/29/2010  BP improved over the course of the month and were excellent for the final week.  Continue current RX. BMET now and in 4 months.  Cayuse Bing, M.D.  I discussed plan with pt, [facility].  Pt will have labwork drawn at this clinic on 05/03/10    Teressa Lower RN  April 30, 2010 2:02 PM    Preventive Screening-Counseling & Management  Alcohol-Tobacco     Smoking Status: quit > 6 months  Current Medications (verified): 1)  Synthroid 125 Mcg  Tabs (Levothyroxine Sodium) .... Take One Tablet Daily. 2)  Prednisone 10 Mg Tabs (Prednisone) .... Take 1 Tablet Poqd 3)  Ibuprofen 800 Mg  Tabs (Ibuprofen) .Marland Kitchen.. 1 By Mouth Two Times A Day 4)  Atenolol 50 Mg  Tabs (Atenolol) .... Take 1/2 Tablet Daily. 5)  Simvastatin 80 Mg Tabs (Simvastatin) .... Take 1 Tablet By Mouth At Bedtime 6)  Prilosec Otc 20 Mg Tbec (Omeprazole Magnesium) .... Once Daily 7)  D 400   Caps (Vitamins A & D) .... Take One Tablet Daily. 8)  Apap 500 Mg  Tabs (Acetaminophen) .... One By Mouth Four Times Daily 9)  Co-Gesic 5-500 Mg Tabs (Hydrocodone-Acetaminophen) .Marland Kitchen.. 1 Tab Q 4 To 6 Hours As Needed 10)  Folic Acid 1 Mg Tabs (Folic  Acid) .Marland Kitchen.. 1 Tab Daily 11)  Niacin Cr 1000 Mg Cr-Tabs (Niacin) .... 3 Tabs By Mouth Once Daily 12)  Fish Oil 1000 Mg Caps (Omega-3 Fatty Acids) .Marland Kitchen.. 1 Tab Daily 13)  Plavix 75 Mg Tabs (Clopidogrel Bisulfate) .Marland Kitchen.. 1 Tablet By Mouth Daily 14)  Tylenol 325 Mg Tabs (Acetaminophen) .... 2 Tablets By Mouth Three Times A Day 15)  Lisinopril 20 Mg Tabs (Lisinopril) .... Take One Tablet By Mouth Daily  Allergies (verified): 1)  ! Levaquin 2)  ! * Doxycycline Hyc  Orders Added: 1)  T-Basic Metabolic Panel [[id number]] 2)  T-Basic Metabolic Panel [phone]

## 2010-10-23 NOTE — Letter (Signed)
Summary: [place] [facility] Work Engineer, agricultural at [facility]  [address] [place] [address]   Phone: [phone]  Fax: [phone]     December 04, 2009 MRN: [id number]   [name] [address] [place], [place]  [address]      YOUR LAB WORK IS DUE   _______________MAY 16, 2011__________________________  Please go to [facility], located across the street from [facility] on the second floor.  Hours are Monday - Friday 7am until 7:30pm         Saturday 8am until 12noon    __  DO NOT EAT OR DRINK AFTER MIDNIGHT EVENING PRIOR TO LABWORK  _X_ YOUR LABWORK IS NOT FASTING --YOU MAY EAT PRIOR TO LABWORK

## 2010-10-23 NOTE — Letter (Signed)
Summary: [place] Results Engineer, agricultural at [facility]  [address] [place] [address]   Phone: [phone]  Fax: [phone]      December 04, 2009 MRN: [id number]   Cobain Rotondo [address] [place], [place]  [address]   Dear Mr. BUSKEY,  Your test ordered by Selena Batten has been reviewed by your physician (or physician assistant) and was found to be normal or stable. Your physician (or physician assistant) felt no changes were needed at this time.  ____ Echocardiogram  ____ Cardiac Stress Test  __X__ Lab Work  ____ Peripheral vascular study of arms, legs or neck  ____ CT scan or X-ray  ____ Lung or Breathing test  ____ Other:  Please increase the amount of potassium in your diet, per Dr. [name].  Enclosed is a copy of your labwork for your records. We will recheck your electrolytes in 2 months, we'll send the lab slip in the mail in May.  Thank you, Imari Reen Allyne Gee RN    [place] Bing, MD, Lenise Arena.C.Gaylord Shih, MD, F.A.C.C Lewayne Bunting, MD, F.A.C.C Nona Dell, MD, F.A.C.C Charlton Haws, MD, Lenise Arena.C.C

## 2010-10-27 LAB — URINE CULTURE
Colony Count: NO GROWTH
Culture  Setup Time: 201201050211
Culture: NO GROWTH

## 2010-11-29 ENCOUNTER — Ambulatory Visit (HOSPITAL_COMMUNITY): Payer: Self-pay

## 2010-12-07 LAB — BASIC METABOLIC PANEL
CO2: 23 mEq/L (ref 19–32)
Potassium: 3.6 mEq/L (ref 3.5–5.1)
Sodium: 138 mEq/L (ref 135–145)

## 2010-12-11 LAB — COMPREHENSIVE METABOLIC PANEL
ALT: 29 U/L (ref 0–53)
AST: 25 U/L (ref 0–37)
Albumin: 3.4 g/dL — ABNORMAL LOW (ref 3.5–5.2)
Calcium: 8.5 mg/dL (ref 8.4–10.5)
GFR calc Af Amer: >60 mL/min (ref >60)
Sodium: 140 mEq/L (ref 135–145)
Total Protein: 6.1 g/dL (ref 6.0–8.3)

## 2010-12-11 LAB — CARDIAC PANEL(CRET KIN+CKTOT+MB+TROPI)
CK, MB: 3 ng/mL (ref 0.3–4.0)
Relative Index: 3.4 — ABNORMAL HIGH (ref 0.0–2.5)
Relative Index: INVALID (ref 0.0–2.5)
Relative Index: INVALID (ref 0.0–2.5)
Total CK: 78 U/L (ref 7–232)
Troponin I: 0.03 ng/mL (ref 0.00–0.06)

## 2010-12-11 LAB — URINALYSIS, ROUTINE W REFLEX MICROSCOPIC
Glucose, UA: NEGATIVE mg/dL
Urobilinogen, UA: 0.2 mg/dL (ref 0.0–1.0)

## 2010-12-11 LAB — DIFFERENTIAL
Eosinophils Absolute: 0.3 10*3/uL (ref 0.0–0.7)
Eosinophils Relative: 3 % (ref 0–5)
Lymphs Abs: 2.5 10*3/uL (ref 0.7–4.0)
Monocytes Absolute: 0.9 10*3/uL (ref 0.1–1.0)
Monocytes Relative: 10 % (ref 3–12)
Neutro Abs: 9.3 10*3/uL — ABNORMAL HIGH (ref 1.7–7.7)
Neutrophils Relative %: 77 % (ref 43–77)

## 2010-12-11 LAB — BRAIN NATRIURETIC PEPTIDE: Pro B Natriuretic peptide (BNP): 48.3 pg/mL (ref 0.0–100.0)

## 2010-12-11 LAB — URINE CULTURE
Colony Count: NO GROWTH
Culture: NO GROWTH

## 2010-12-11 LAB — CBC
Hemoglobin: 14.4 g/dL (ref 13.0–17.0)
MCHC: 35.8 g/dL (ref 30.0–36.0)
MCHC: 35.8 g/dL (ref 30.0–36.0)
Platelets: 207 10*3/uL (ref 150–400)
RDW: 13.5 % (ref 11.5–15.5)
RDW: 13.6 % (ref 11.5–15.5)

## 2010-12-11 LAB — BASIC METABOLIC PANEL
CO2: 21 mEq/L (ref 19–32)
Calcium: 9.2 mg/dL (ref 8.4–10.5)
Chloride: 109 mEq/L (ref 96–112)
Creatinine, Ser: 0.97 mg/dL (ref 0.4–1.5)
GFR calc non Af Amer: >60 mL/min (ref >60)
Sodium: 139 mEq/L (ref 135–145)

## 2010-12-11 LAB — CK TOTAL AND CKMB (NOT AT ARMC)
CK, MB: 5.3 ng/mL — ABNORMAL HIGH (ref 0.3–4.0)
Relative Index: 4.9 — ABNORMAL HIGH (ref 0.0–2.5)
Total CK: 109 U/L (ref 7–232)

## 2010-12-11 LAB — POCT CARDIAC MARKERS
CKMB, poc: 3.1 ng/mL (ref 1.0–8.0)
Troponin i, poc: <0.05 ng/mL (ref 0.00–0.09)

## 2010-12-12 LAB — LIPID PANEL
ALT: 39 U/L (ref 10–40)
Alkaline Phosphatase: 54 U/L
Bilirubin, Direct: 0.1 mg/dL (ref 0.01–0.4)
Bilirubin, Indirect: 0.5
Total Protein ELP: 7.1

## 2011-02-05 NOTE — Letter (Signed)
August 31, 2007    Scott A. Gerda Diss, MD  [address], [address]  [place], [place] [address]   RE:  ANEES, VANECEK  MRN:  [id number]  /  DOB:  June 15, 1946   PRIMARY CARDIOLOGIST:  Dr. [name], previously Dr. Garnette Scheuermann.   RHEUMATOLIST:  Dr. Coral Spikes.   Dear [name]:   It is my pleasure reassessing Mr. Klingler in the office today at your  request.  As you know, I first saw this nice gentleman when he was  hospitalized at [facility] in April of 2007.  At that time, his symptoms  were clearly of gastrointestinal origin.  A stress test was planned  following the hospital discharge, but was never obtained.  He did well  since, until recent weeks when he has noted exertional dyspnea, chest  tightness.  This is classic angina, occurred when he walks up the hill  or exerts himself excessively.  It is promptly relieved with rest.  He  has old nitroglycerin, but has not used it.   His cardiac history dates to the 1980's, when CABG surgery was carried  out at [facility] by Dr. Andrey Campanile.  The patient and his wife  thinks the year was probably 65.  We sought those records at the time  of his first evaluation and will once again request this information  from [facility].  Cardiovascular risk factors have included hypertension  and dyslipidemia.  He also has rheumatoid arthritis, which is associated  with an increased risk for coronary disease.  His only other medical  problem is GERD.  He smoked cigarettes in the remote past, but not for  approximately 40 years.  Vaccinations are up to date.   Past surgeries have included a right total knee arthroplasty, and left  ankle fusion and a right wrist fusion.   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  He did hunting.  Married with one adult child.   FAMILY HISTORY:  Father died at age 27 from myocardial infarction.  Mother died due to neoplastic disease.  He has 7 siblings, one who died  with leukemia,  two who suffered traumatic death and one living with  Alzheimer's.   REVIEW OF SYSTEMS:  Notable for the need for corrective lenses for near  vision, developing cataracts, previously being told of a heart murmur,  diffuse arthritic discomfort and intermittent pedal edema.  All other  systems reviewed and are negative.   EXAMINATION:  GENERAL:  Pleasant, well-appearing gentleman.  The weight  is 181, blood pressure 130/75, heart rate 60 and regular, respirations  18.  HEENT:  Benign funduscopic exam; EOMs full; normal oral mucosa.  NECK:  No jugular venous distention; normal carotid upstrokes without  bruits.  LUNGS:  Clear.  CARDIAC:  Split first heart sounds; normal second heart sound.  ABDOMEN:  Soft and nontender; no organomegaly.  EXTREMITIES:  No edema; normal distal pulses.  NEUROMUSCULAR:  Symmetric strength and tone; normal cranial nerves.   IMPRESSION:  EKG:  Sinus bradycardia; left atrial enlargement; right  ventricular conduction delay; LVH with repolarization abnormality; Prior  inferior myocardial infarction; probable prior anteroseptal myocardial  infarction.  Would compare to previous study of April 2007.  No  significant change.   No recent laboratory available.   IMPRESSION:  Mr. Pineo has classic angina with known coronary disease.  It is likely that he has progression of disease and will require cardiac  catheterization.  He reports  two coronary angiograms since his surgery.  We will seek those records.  I will add amlodipine 5 mg q.d. and  sublingual nitroglycerin to his medical regime.  He will call or contact  EMS, if he has a prolonged or severe episode of chest discomfort or an  episode occurring at rest.  Otherwise, I will plan to reassess this nice  gentleman in three weeks after a lipid profile has been obtained.  Thanks so much for sending him back to see me.    Sincerely,      Gerrit Friends. [name], MD, [facility]  Electronically Signed    RMR/MedQ   DD: 08/31/2007  DT: 09/01/2007  Job #: [id number]

## 2011-02-05 NOTE — Assessment & Plan Note (Signed)
OFFICE VISIT   MANSEAU, Pink D  DOB:  Feb 10, 1946                                       06/27/2008  YNWGN#:[id number]   REASON FOR VISIT:  Followup.   HISTORY:  A 65 year old gentleman whom I began seeing for bilateral  claudication right leg greater than left and occurring at approximately  1/4 of a mile.  He is status post arteriogram in February 2009.  At that  time he was found to have a mobile flap in his left common iliac artery  which was treated with a self-expanding stent.  His right leg disease is  limited to his infrapopliteal artery and therefore no intervention was  performed.  Comes back in for followup.  Denies having symptoms.  Says  his legs feel great and has not had any more pain.   PHYSICAL EXAMINATION:  Blood pressure 158/86, pulse 62.  General:  Well  appearing, no acute distress.  Legs are warm and well perfused.  There  is mild edema bilaterally, left greater than right.  No ulceration.Marland Kitchen   DIAGNOSTIC STUDIES:  Ultrasound today reveals widely patent iliac  stents.  Ankle brachial index on the right is 0.87, on the left 0.94.   ASSESSMENT/PLAN:  Bilateral claudication.   PLAN:  The patient is currently asymptomatic.  Therefore, I do not  recommend any intervention.  He is going to follow up with me in 1 year.  He will continue to have regular ultrasound surveillance of his stents.   [name], MD  Electronically Signed   VWB/MEDQ  D:  06/27/2008  T:  06/28/2008  Job:  [id number]   cc:   Donna Bernard, M.D.

## 2011-02-05 NOTE — Procedures (Signed)
AORTA-ILIAC DUPLEX EVALUATION   INDICATION:  Follow up left common iliac and external iliac artery  stents.   HISTORY:  Diabetes:  No.  Cardiac:  CAD, CABG.  Hypertension:  No.  Smoking:  No.  Previous Surgery:  Left common iliac and external iliac artery stents on  11/17/07.               SINGLE LEVEL ARTERIAL EXAM                              RIGHT                  LEFT  Brachial:                  136                    132  Anterior tibial:           108                    128  Posterior tibial:          118                    126  Peroneal:  Ankle/brachial index:      0.87                   0.94  Previous ABI/date:         12/14/07, 0.66         12/14/07, 0.86   AORTA-ILIAC DUPLEX EXAM  Aorta - Proximal     Not visualized  Aorta - Mid          79 cm/s  Aorta - Distal       86 cm/s   RIGHT                                   LEFT  186 cm/s          CIA-PROXIMAL          89 cm/s  150 cm/s          CIA-DISTAL            115 cm/s  Not visualized    HYPOGASTRIC           119 cm/s  155 cm/s          EIA-PROXIMAL          102 cm/s  106 cm/s          EIA-MID               162 cm/s  103 cm/s          EIA-DISTAL            115 cm/s   IMPRESSION:  1. Patent bilateral common iliac, external iliac, and left hypogastric      arteries with a mildly elevated velocity noted in the right      proximal common iliac artery.  2. The proximal aorta and right hypogastric artery was not adequately      visualized.  3. Stable bilateral ankle brachial indices noted.   ___________________________________________  V. Charlena Cross, MD   CH/MEDQ  D:  06/27/2008  T:  06/27/2008  Job:  [id number]

## 2011-02-05 NOTE — Procedures (Signed)
CAROTID DUPLEX EXAM   INDICATION:  Per MD.  Patient complains of episodic dizziness upon  standing for the past 3 weeks.   HISTORY:  Diabetes:  No.  Cardiac:  CAD.  CABG x6 in 1998.  Hypertension:  Yes.  Smoking:  No.  Previous Surgery:  Left CIA, EIA stents on 11/17/2007 by Dr. Myra Gianotti.  CV History:  Amaurosis Fugax No, Paresthesias No, Hemiparesis No                                       RIGHT             LEFT  Brachial systolic pressure:         122               128  Brachial Doppler waveforms:         WNL               WNL  Vertebral direction of flow:        Antegrade         Antegrade  DUPLEX VELOCITIES (cm/sec)  CCA peak systolic                   74                85  ECA peak systolic                   114               102  ICA peak systolic                   100               81  ICA end diastolic                   35                30  PLAQUE MORPHOLOGY:                  Mixed             Mixed  PLAQUE AMOUNT:                      Mild              Mild  PLAQUE LOCATION:                    Bifurcation, ICA  Bifurcation, ICA   IMPRESSION:  1. Bilateral 1 to 39% ICA stenosis.  2. Patent external carotid arteries bilaterally.  3. Bilateral vertebral arteries with antegrade flow.   ___________________________________________  V. Charlena Cross, MD   PB/MEDQ  D:  11/30/2007  T:  11/30/2007  Job:  [id number]

## 2011-02-05 NOTE — Procedures (Signed)
AORTA-ILIAC DUPLEX EVALUATION   INDICATION:  Followup left iliac stents   HISTORY:  Diabetes:  no  Cardiac:  CAD, CABG  Hypertension:  no  Smoking:  Previous  Previous Surgery:  Left common iliac artery and external iliac artery  stents 11/17/2007               SINGLE LEVEL ARTERIAL EXAM                              RIGHT                  LEFT  Brachial:                  132                    139  Anterior tibial:           115                    133  Posterior tibial:          117                    131  Peroneal:  Ankle/brachial index:      0.84                   0.96  Previous ABI/date:         On 06/27/2008 was 0.87 0.94   AORTA-ILIAC DUPLEX EXAM  Aorta - Proximal     111 Cm/s  Aorta - Mid          80 cm/s  Aorta - Distal       102 cm/s   RIGHT                                   LEFT  118 cm/s          CIA-PROXIMAL          115 cm/s  197 cm/s          CIA-DISTAL            126 cm/s  119 cm/s          HYPOGASTRIC           138 cm/s  138 cm/s          EIA-PROXIMAL          117 cm/s  135 cm/s          EIA-MID               118 cm/s  133 cm/s          EIA-DISTAL            123 cm/s   IMPRESSION:  1. Patent bilateral common iliac artery, external iliac artery and      hypogastric arteries with stable mildly elevated velocities in      right common iliac artery  2. Stable ankle brachial indices bilaterally.  3. No significant changes from previous study.   ___________________________________________  V. Charlena Cross, MD   AS/MEDQ  D:  07/10/2009  T:  07/10/2009  Job:  [id number]

## 2011-02-05 NOTE — Letter (Signed)
February 23, 2008    Fernando A. Gerda Diss, MD  [address], [address]  [place], [place] [address]   RE:  [name], [name]  MRN:  [id number]  /  DOB:  May 24, 1946   Dear [name]:   [name] returns to the office for continued assessment and treatment  of exertional dyspnea.  His workup to date has not been particularly  revealing.  Arterial blood gas at rest on room air was normal.  His PFTs  show good mechanics with a mild to moderate impairment in the diffusing  capacity of carbon monoxide.  His chest x-ray showed some density of a  cardiac base.  An informal stress test in the office did not suggest a  cardiac etiology.  He has had a superficial clot in his legs, but never  DVT or any entity requiring treatment with warfarin.  His most recent  lipid profile was fairly good, but triglycerides were elevated, and HDL  was low.   Medications are unchanged from his last visit.   EXAM:  Pleasant gentleman in no acute distress.  The weight is 170, 7 pounds less than 1 month ago.  Blood pressure  100/60, heart rate 70 and regular, respirations 18.  NECK:  No jugular venous distention; no carotid bruits.  LUNGS:  Few bibasilar rales.  CARDIAC:  Normal first and second heart sounds; modest basilar systolic  ejection murmur.  ABDOMEN:  Soft and nontender; no organomegaly; aortic pulsation not  palpable.  EXTREMITIES:  No edema.   IMPRESSION:  [name] does not have any obstructive lung disease.  There remains the possibility that he has some rheumatic involvement of  his lungs or other primary pulmonary  condition.  I doubt that he is experiencing chronic pulmonary emboli.  Nonetheless, we will proceed with a CT scan of his chest.  I have also  referred him to Dr. Danise Mina for further evaluation of his symptoms.  His dose of niacin will be further increased to 1000 mg t.i.d. with a  repeat lipid profile to be obtained in 1 month.  I will see this nice  gentleman again in 6 months.     Sincerely,      Fernando Friends. [name], MD, [facility]  Electronically Signed    RMR/MedQ  DD: 02/23/2008  DT: 02/23/2008  Job #: [id number]

## 2011-02-05 NOTE — Assessment & Plan Note (Signed)
OFFICE VISIT   [name], [name]  DOB:  Jul 23, 1946                                       11/09/2007  ZOXWR#:[id number]   REFERRING PHYSICIAN:  Dr. Lubertha [name].   REASON FOR VISIT:  Claudication, right greater than left.   HISTORY:  This is a 65 year old gentleman whom I am seeing at the  request of Dr. Gerda [name] for evaluation of claudication.  The patient  states that he can walk approximately 1/4 of a mild, at which time he  experiences cramping in his right leg.  He denies having any rest pain.  He denies having any non-healing wounds.  He states that this is very  lifestyle debilitating for him.   The patient denies any cardiac history.  He most recently had a cardiac  catheterization approximately 1 week ago, at which time, no intervention  was performed.  He does have cardiac bypass surgery performed many years  ago.   REVIEW OF SYSTEMS:  GENERAL:  Positive for a small weight gain.  He  weighs 174 pounds.  CARDIAC:  Occasional chest pain, shortness of breath with exertion,  heart murmur.  PULMONARY:  Negative.  GASTROINTESTINAL:  Negative.  GENITOURINARY:  Negative.  VASCULAR:  Pain in the legs with walking.  NEURO:  Negative.  ORTHO:  Positive for arthritis in the right knee.  PSYCH:  Negative.  ENT:  Negative.  HEMATOLOGICAL:  Negative.   PAST MEDICAL HISTORY:  Hypertension, coronary artery disease,  hypercholesterolemia, hypothyroidism.   PAST SURGICAL HISTORY:  Right knee replacement.  Coronary artery bypass  grafting.   FAMILY HISTORY:  Positive for cardiovascular disease in his father who  died with a heart attack.   SOCIAL HISTORY:  Married with 1 child.  He is retired.  Does not smoke.  Has a history of smoking, but quit in 1967.  Does not drink alcohol.   MEDICATIONS:  Synthroid 125 mcg once daily.  Darvocet 100 mg 3 per day.  Prednisone 5 mg once a day.  TriCor 145 mg once a day.  Ibuprofen 800 mg  3 per day.  Atenolol 50 mg 1/2  tablet per day.  Fosamax 70 mg once per  week.  Methotrexate 2.5 mg 4 tablets per week.  Folic acid 1 mg per day.  Aspirin 81 mg once daily.  Tums 4 tablets per day.  Tylenol 6 per day.  Vitamin [name] 400 units per day.  Crestor 40 mg daily.  Niacin 1 daily.   ALLERGIES:  None.   PHYSICAL EXAMINATION:  In general, well-appearing, in no acute distress.  HEENT, normocephalic, atraumatic.  Pupils are equal.  Sclerae anicteric.  Neck, there is no JVD.  There are no carotid bruits.  Cardiovascular is  regular rate and rhythm.  No murmurs, rubs, or gallops.  Pulmonary,  lungs clear to auscultation bilaterally.  Abdomen is soft and nontender.  He has bilateral palpable femoral pulses.  Extremities are warm and well-  perfused.  Pedal pulses are not palpable.  There are no ulcerations.  Psyche, he is alert and oriented x3.  Neuro, cranial nerves 2-12 grossly  intact.  Skin is without rash.   DIAGNOSTIC STUDIES:  The patient had an outside duplex study, which  reveals an ankle brachial index of 0.78 on the right and 0.84 on the  left.  Segmental pressure suggests primarily  popliteal and  infrapopliteal occlusive disease.   ASSESSMENT AND PLAN:  A 65 year old with claudication right greater than  left.   PLAN:  I have an extensive discussion with the patient regarding our  treatment options.  At this time, his symptoms are lifestyle limiting to  him and he would like to proceed with further intervention/imaging.  I  have scheduled him for arteriogram to be performed Tuesday February 24.  An aortogram with bilateral lower extremity runoff access from the left  groin.  If intervention is possible in the right leg, we will do so at  this time.  Risks and benefits were discussed.  Informed consent will be  signed.   [name], MD  Electronically Signed   VWB/MEDQ  [name]:  11/09/2007  T:  11/10/2007  Job:  [id number]   cc:   Lacretia Nicks. Simone Curia, M.[name].

## 2011-02-05 NOTE — Procedures (Signed)
AORTA-ILIAC DUPLEX EVALUATION   INDICATION:  Follow up left iliac stents.   HISTORY:  Diabetes:  No  Cardiac:  CABG x6  Hypertension:  Yes  Smoking:  Previous  Previous Surgery:  Stenting               SINGLE LEVEL ARTERIAL EXAM                              RIGHT                  LEFT  Brachial:                  132                    146  Anterior tibial:           124                    121  Posterior tibial:          132                    118  Peroneal:  Ankle/brachial index:      0.90                   0.83  Previous ABI/date:         07/10/2009, 0.84       07/10/2009, 0.94   AORTA-ILIAC DUPLEX EXAM  Aorta - Proximal     69 cm/s  Aorta - Mid          80 cm/s  Aorta - Distal       93 cm/s   RIGHT                                   LEFT  Not evaluated     CIA-PROXIMAL          134 cm/s  Not evaluated     CIA-DISTAL            129 cm/s  Not evaluated     HYPOGASTRIC           126 cm/s  Not evaluated     EIA-PROXIMAL          111 cm/s  Not evaluated     EIA-MID               146 cm/s  Not evaluated     EIA-DISTAL            130 cm/s   IMPRESSION:  1. Patent left iliac stents without evidence of stenosis.  2. Biphasic waveforms at the bilateral ankles.  3. Stable findings from previous study of 07/10/2009.   ___________________________________________  V. Charlena Cross, MD   LT/MEDQ  D:  08/23/2010  T:  08/23/2010  Job:  [id number]

## 2011-02-05 NOTE — Op Note (Signed)
[name], [name]                  ACCOUNT NO.:  [id number]   MEDICAL RECORD NO.:  [id number]          PATIENT TYPE:  AMB   LOCATION:  SDS                          FACILITY:  [facility]   PHYSICIAN:  VDurene Cal IV, MDDATE OF BIRTH:  07-Jan-1946   DATE OF PROCEDURE:  11/17/2007  DATE OF DISCHARGE:  11/17/2007                               OPERATIVE REPORT   PREOPERATIVE DIAGNOSIS:  Right leg claudication.   POSTOPERATIVE DIAGNOSIS:  Right leg claudication.   PROCEDURE PERFORMED:  1. Ultrasound-guided left common femoral artery access.  2. Abdominal aortogram.  3. Right lower extremity runoff.  4. Second-order cannulation.  5. Stent in left common iliac artery.  6. Stent in left external iliac artery.  7. Retrograde left femoral angiogram.  8. Closure device (StarClose).  9. Conscious sedation from 8:23 to 9:33.   INDICATIONS:  This is a 65 year old gentleman with right greater than  left lower extremity claudication.  He had mild disease on his duplex  but it is lifestyle limiting to him and therefore he came in for an  arteriogram with the possibility of intervention.  Risks and benefits  were discussed and informed consent was signed.   PROCEDURE:  The patient was identified in the holding area and taken to  room 8, hewas placed supine on table.  Bilateral groins were prepped and  draped in a standard sterile fashion.  A timeout was called.  The left  common femoral artery was evaluated with ultrasound and found to be  widely patent.  Lidocaine 1% was used for local anesthesia.  Using  ultrasound, the left common femoral artery was accessed with an 18-gauge  needle.  An 0.035 Bentson wire was advanced in retrograde fashion into  the abdominal aorta under fluoroscopic visualization.  Next, 5-French  sheath was placed.  Over the wire, an Omniflush catheter was advanced to  the level of L1 and an abdominal aortogram was obtained.   Catheter was then brought down to the aortic  bifurcation and pelvic  angiogram was obtained.   FINDINGS:  Aortogram:  The visualized portions of the suprarenal  abdominal aorta showed minimal disease.  There are  single renal  arteries bilaterally which are widely patent.  The infrarenal abdominal  aorta has mild disease.  There was what appears to be an ulcerated  plaque at the distal aorta at the level of the bifurcation.  The right  common iliac, right external iliac, and right internal iliac artery are  widely patent.  The left external iliac and internal iliac artery are  widely patent.  Within the left common iliac artery, there is a mobile  intimal flap.  This either represents a mobile intimal flap versus  thrombus.   INTERVENTION:  With the findings in the left common iliac artery, the  decision was made to treat this prior to proceeding with any other  portion of the procedure.  A Rosen wire was placed and a 7-French Terumo  sheath was then advanced into the proximal left external iliac artery.  The artery appeared to need a 12-mm stent,  the smallest length stent  that we had was a 12 x 40.  I was concerned of placing a 10-mm balloon  expandable stent for fear that it would not adhere to the wall, for that  reason I selected a 12 x 40 Cordis self-expanding stent.  The stent was  successfully deployed  beginning in the proximal common iliac landing in  the proximal left external iliac covering the hypogastric.  Flow,  however, was preserved in the hypogastric artery.  On followup imaging,  the mobile flap no longer existed.   After  taking care of the mobile flap, I turned my attention towards the  right lower extremity runoff.  Using the Omniflush catheter and a Rosen  wire, the aortic bifurcation was crossed.  The sheath was then advanced  over the bifurcation and placed into the right external iliac artery  followed by the Omniflush catheter.  Images were then obtained of the  right leg through the Omniflush  catheter, which was in the external  iliac artery.   FINDINGS:  Right lower extremity:  The right profunda femoral and  superficial femoral artery were widely patent without areas of stenosis  or occlusion.  The popliteal artery below the knee is diseased.  Anterior tibial artery is occluded.  Abdominal runoff vessel to the foot  is via the  posterior tibial artery, which does cross the ankle.  The  peroneal artery appears patent down to the level of the ankle.   After the above images were obtained, decision was made not to intervene  at this time given the patient's symptoms.  Then catheters and sheaths  were pulled back into the left external iliac artery and then a  retrograde left femoral angiogram was obtained to evaluate for closure  device.  It was found to be adequate and a StarClose was then  successfully deployed.  The patient tolerated the procedure well and was  taken to recovery room in stable condition.  There are no complications.   IMPRESSION:  1. Intimal flap within the left common iliac artery, which was treated      using a 12 x 40 self-expanding Cordis Smart  stent.  2. 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  3. Successful closure.           ______________________________  V. Charlena Cross, MD  Electronically Signed     VWB/MEDQ  D:  11/17/2007  T:  11/17/2007  Job:  [id number]

## 2011-02-05 NOTE — Letter (Signed)
October 29, 2007    Fernando A. Gerda Diss, MD  [address], [address]  [place], [place] [address]   RE:  Fernando, [name]  MRN:  [id number]  /  DOB:  05/04/1946   Dear [name]:   [name] return to the office for further assessment of angina.  He  has limited activity and essentially prevented any recurrent symptoms,  which he finds quite oppressive.  He also reported claudication of the  right leg to you and underwent ABI studies.  These are moderately  depressed bilaterally, more so on the right.  He is scheduled to see a  vascular surgeon in the near future.   His medications are unchanged from his last visit, except that we have  increased niacin to 750 mg t.i.d. based upon a slightly suboptimal lipid  profile.  Total cholesterol was acceptable at 198, and LDL was fairly  good at 86.  HDL was 46, but triglycerides were 329.   PHYSICAL EXAMINATION:  A pleasant gentleman in no acute distress.  Weight is 177, 4 pounds less than at his last visit.  Blood pressure  130/75, heart rate 60 and regular, respirations 18.  NECK:  No jugular venous distention; left carotid bruit present.  LUNGS:  Clear.  CARDIAC:  Normal first and second heart sounds; fourth heart sound  present.  ABDOMEN:  Soft and nontender; no organomegaly.  EXTREMITIES:  No edema; 1+ distal pulses.   IMPRESSION:  [name] is stable with respect to coronary disease.  Coronary angiography is warranted to define his prognosis and to  determine if intervention is possible and warranted.  The risks and  benefits were described to him.  He agrees to proceed.  We will schedule  this test when feasible and provide you with the results as soon as they  are available.  His ABIs are not severely depressed.  A trial of  exercise and medical therapy may be warranted.  I will certainly leave  that to vascular surgery's discretion as well as suggest they obtain  carotid ultrasound when he is seen in their office.   Thanks so much for  allowing me to continue to follow this nice gentleman  with you.    Sincerely,      Fernando Friends. [name], MD, [facility]  Electronically Signed    RMR/MedQ  DD: 10/29/2007  DT: 10/29/2007  Job #: [id number]   CC:   Demetria Pore. Coral Spikes, M.D.  [facility]

## 2011-02-05 NOTE — Assessment & Plan Note (Signed)
OFFICE VISIT   [name], Fernando D  DOB:  12/03/1945                                       07/10/2009  ZOXWR#:[id number]   CHIEF COMPLAINT:  Is leg pain.   REASON FOR VISIT:  Follow up stent.   HISTORY:  This is a 65 year old gentleman that I met in February 2009  where he had right greater than left claudication.  He underwent  stenting of his left common and external iliac arteries in February  2009, he is back today for follow-up.  He has had no complaints since  the last time I saw him.  He has had to have lithotripsy of kidney  stones, but otherwise he has been without complaints.  He says his  cholesterol has been an issue as he continues taking a statin, but it is  not well-controlled.  Blood pressure has been stable on medical therapy.   The patient continues to be a nonsmoker and nondrinker.   REVIEW OF SYSTEMS:  Is positive for weight gain, history of recent  kidney stones, pain in his legs with walking and arthritis, joint pain,  muscle pain.  All other review of systems is negative.   PHYSICAL EXAMINATION:  Vital signs:  Blood pressure 136/76, pulse 78,  respirations are 16.  He is well-appearing in no distress.  Normocephalic, atraumatic.  Neck:  He has bilateral carotid bruits.  Cardiovascular:  Regular rate and rhythm, respirations nonlabored.  Extremities are warm and well perfused.  Skin:  Without rash.  Psych:  He is alert and oriented x3.   DIAGNOSTICS:  I have independently reviewed his ultrasound and this  reveals ankle brachial index of 0.84 on the right which is slightly down  from 0.87 previously, on the left it is 0.96 which is slightly improved  from 0.94 from previous.  He is mildly elevated velocities within his  iliac stent.   ASSESSMENT/PLAN:  Status post left common external iliac stent.   PLAN:  The patient will continue with his ultrasound surveillance.  He  is very stable at this time, he does have carotid bruits but  ultrasound  of his carotids reveal minimal disease.  Plan on seeing him back in 1  year.   [name], MD  Electronically Signed   VWB/MEDQ  D:  07/10/2009  T:  07/11/2009  Job:  [id number]   cc:   Donna Bernard, M.D.

## 2011-02-05 NOTE — Cardiovascular Report (Signed)
NAMECRISTON, [name]                  ACCOUNT NO.:  [id number]   MEDICAL RECORD NO.:  [id number]          PATIENT TYPE:  OIB   LOCATION:  1962                         FACILITY:  [facility]   PHYSICIAN:  Jonelle Sidle, MD DATE OF BIRTH:  [birth year]   DATE OF PROCEDURE:  11/02/2007  DATE OF DISCHARGE:  11/02/2007                            CARDIAC CATHETERIZATION   REQUESTING CARDIOLOGIST:  Dr.  Bing.   INDICATIONS:  [name] is a pleasant 65 year old gentleman with a  history of multivessel coronary artery disease status post coronary  artery bypass grafting in 1998 including a LIMA to left anterior  descending and diagonal, saphenous vein graft to the first and second  obtuse marginal, and saphenous vein graft to the right coronary artery  and posterior descending system.  He had a coronary angiogram  approximately 10 years ago following his surgery that demonstrated  occlusion of the saphenous vein graft to the right coronary system and  otherwise graft patency.  He is referred now for repeat coronary  angiography given progressive symptoms of exertional angina.  The  potential risks and benefits were explained to him in advance, and  informed consent was obtained.   PROCEDURE PERFORMED:  1. Left heart catheterization.  2. Selective coronary angiography.  3. Bypass graft angiography.  4. Left ventriculography.   ACCESS AND EQUIPMENT:  The area about the right femoral artery was  anesthetized with 1% percent lidocaine.  A 4-French sheath was placed in  the right femoral artery via the modified Seldinger technique.  Standard  preformed 4-French JL-4 and JR-4 catheters were used for selective  coronary angiography and selective bypass graft angiography.  An angled  pigtail catheter was used for left heart catheterization and left  ventriculography.  All exchanges were made over a wire.  Total of 95 mL  Omnipaque were used.  The patient tolerated the procedure well  without  immediate complications.   HEMODYNAMIC RESULTS:  Left ventricle 139/16 mmHg.  Aorta 136/67 mmHg.   ANGIOGRAPHIC FINDINGS:  1. Left main coronary artery is severely diseased to approximately 95%      in diffuse fashion.  This has progressed compared to the previous      angiogram.  This vessel gives rise to the left anterior descending,      a very small ramus intermedius, and circumflex vessels.  2. The left anterior descending is occluded proximally.  3. The circumflex coronary artery is occluded proximally.  4. The right coronary artery is occluded proximally just after the      takeoff of a right ventricular marginal branch.  There are some      right to right bridging collaterals noted filling the right      coronary artery nearly down to the distal portion.  5. The saphenous vein graft to the first and second obtuse marginals      is widely patent.  There is an area of 30-40% stenosis within the      proximal vessel although not clearly flow-limiting.  The      anastomotic sites look to be intact.  6. There is known occlusion of the saphenous vein graft of the right      coronary system.  7. The LIMA to the left anterior descending and diagonal is widely      patent.  Anastomotic sites are intact.  Of note, the distal left      anterior descending is diffusely diseased, and there is an apical      90% stenosis noted.  The diagonal branch is also diffusely diseased      and relatively small.  8. There is some left-to-right collateralization of the distal right      coronary artery system, although not well-developed.   Ventriculography is performed in the RAO projection and reveals an  ejection fraction of approximately 60% with no significant wall motion  abnormality and no mitral regurgitation.   DIAGNOSES:  1. Severe native coronary artery disease as outlined.  There has been      progression in left main disease, although continued occlusion      proximally of  the left anterior descending and circumflex vessels.      The distal left anterior descending is diffusely diseased, and      there is some left-to-right collateralization of the occluded right      coronary artery although not to a large degree.  2. Patent left internal mammary artery to left anterior descending and      diagonal, patent saphenous vein graft to the first and second      obtuse marginal with 30-40% proximal vein graft stenosis (not      clearly flow-limiting), and known occlusion of the saphenous vein      graft to posterior descending and right coronary artery.  3. Left ventricular ejection fraction of approximately 60% with no      large focal wall motion abnormality and a left ventricular end-      diastolic pressure of 16 mmHg.  No significant mitral regurgitation      is noted.   DISCUSSION:  No obvious revascularization options noted at this time.  Suspect that angina may well be related to progression in the patient's  native disease rather than new hemodynamically significant occlusions  within his remaining 2 bypass grafts which are widely patent.  There is  30-40% stenosis within the saphenous vein graft of the obtuse marginal  system, although this does not appear to be flow-limiting.  At this  point, medical therapy seems to be the best option.  I discussed this  with the patient, his family, and with Dr. [name].      Jonelle Sidle, MD  Electronically Signed     SGM/MEDQ  D:  11/02/2007  T:  11/03/2007  Job:  [id number]   cc:   Gerrit Friends. [name], MD, [facility]  Scott A. Gerda Diss, MD

## 2011-02-05 NOTE — Assessment & Plan Note (Signed)
OFFICE VISIT   FEDORA, Hoyt D  DOB:  [birth year]                                       12/14/2007  MWUXL#:[id number]   REASON FOR VISIT:  Followup.   REFERRING PHYSICIAN:  Dr. Lubertha South.   HISTORY:  This is a 65 year old gentleman whom I have been seeing for  right leg claudication.  This occurs at approximately 1/4 mile.  He  underwent arteriogram on 11/17/2007.  At that time, I found a mobile  flap in his left common iliac artery, which was treated with a self-  expanding stent.  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  For that reason, no intervention was performed.   PHYSICAL EXAMINATION:  Vital signs:  Blood pressure is 120/65, pulse 66,  respirations 19.  Generally he is well-appearing, in no acute distress.  His legs are warm and well-perfused.  There is no ulceration.   DIAGNOSTIC STUDIES:  The patient had a duplex today, which reveals ankle  brachial index essentially unchanged since previous studies.   PLAN:  Right leg claudication.  I had an extensive conversation with the  patient.  At this time, since he has infrapopliteal disease, I feel that  medical management is our best option.  Should his disease progress to  non-healing wounds or rest pain, we would contemplate tibial artery  bypass.  However, at this point in time, I do not feel it is necessary.  I will have him follow up and see me in 6 months.  At that time, he will  have repeat ultrasound.   [name], MD  Electronically Signed   VWB/MEDQ  D:  12/14/2007  T:  12/15/2007  Job:  [id number]   cc:   Donna Bernard, M.D.

## 2011-02-05 NOTE — Letter (Signed)
December 15, 2008    Scott A. Gerda Diss, MD  [address], [address]  [place], [place] [address]   RE:  GIANNI, MIHALIK  MRN:  [id number]  /  DOB:  1946-03-17   Dear [name]:   Mr. Sada returns to the office for continued management of  cardiovascular risk factors.  Since I last saw him, he was evaluated by  Dr. Danise Mina, who feels that the etiology of his chronic lung disease  is either directly related to his rheumatoid arthritis or secondary to  his treatment with methotrexate.  In any case, Mr. Moodie condition is  stabilized.  He is able to do most activities that he would like to  accomplish without dyspnea, currently reporting class II symptoms.  He  cares for his yard and works around the house without difficulty.  He  has had no chest discomfort.  He has had no claudication.   CURRENT MEDICATIONS:  1. Levothyroxine 0.125 mg daily.  2. Darvocet b.i.d.  3. Prednisone 5 mg daily.  4. Atenolol 25 mg daily.  5. Folate 1 mg daily.  6. Aspirin 81 mg daily.  7. Simvastatin 80 mg daily.  8. Niacin 1000 mg t.i.d.  9. Ibuprofen 800 mg b.i.d.  10.Tylenol Extra Strength 2 t.i.d.  11.Amlodipine 5 mg daily.  12.Fish oil 1200 mg daily.  13.NTG p.r.n.  14.Prilosec 20 mg daily.   PHYSICAL EXAMINATION:  GENERAL:  A very pleasant gentleman in no acute  distress.  VITAL SIGNS:  The weight is 180 pounds, 10 pounds more than in June  2009, of last year; blood pressure 120/60; heart rate 66 and regular;  and respirations 12 and unlabored.  NECK:  No jugular venous distention; normal carotid upstrokes without  bruits.  LUNGS:  Clear.  CARDIAC:  Normal first and second heart sounds; fourth heart sound  present; modest early systolic ejection murmur.  ABDOMEN:  Soft and nontender; no organomegaly.  SKIN:  No significant lesions.  EXTREMITIES:  No edema; rheumatic deformity of the hands.  PSYCHIATRIC:  Alert and oriented; normal affect.   Most recent lipid profile was obtained in July 2009.   Total cholesterol  was 200 with triglycerides of 294, HDL of 57, and LDL of 84.  Chemistry  profile was entirely normal at that time.   Dr. Lynelle Doctor previous evaluation was obtained and reviewed.   IMPRESSION:  Mr. Rosenow is doing well from a cardiovascular standpoint.  Hypertension is under excellent control.  Hyperlipidemia is under  generally good control.  Due to financial considerations, he cannot  afford a different and more potent statin.  He has no symptoms to  suggest recurrent myocardial ischemia.  His pulmonary condition appears  stable and tolerable.  He will continue his current medical regime.  I  have renewed an old prescription that he had for nitroglycerin.  I will  plan to see this nice gentleman again in 1 year; at which time, lipid  profile and chemistry profile will be repeated.    Sincerely,      Gerrit Friends. [name], MD, [facility]  Electronically Signed    RMR/MedQ  DD: 12/15/2008  DT: 12/16/2008  Job #: [id number]

## 2011-02-05 NOTE — Letter (Signed)
Jan 26, 2008    Scott A. Gerda Diss, MD  [address], [address]  [place], [place] [address]   RE:  [name]  MRN:  [id number]  /  DOB:  [birth year]   Dr. [name],   [name] returns to the office for continued assessment and treatment  of coronary disease and angina.  Since his last visit, chest tightness  has resolved.  He attributes this to treatment with amlodipine.  Unfortunately, he is now is reporting severe dyspnea with mild to  moderate exertion.  He is able to walk down a short incline, but unable  to walk up it without multiple periods of rest.  He describes both  difficulty in taking a deep breath and perhaps some air hunger.  He also  has pleuritic chest discomfort.  He has had no cough, sputum production,  fever nor rigors.  A chest x-ray performed in February prior to his  catheterization showed no acute abnormalities.  There were chronic  interstitial changes and some opacity at the left costophrenic angle, as  well as old right rib fractures.   Medications are unchanged from his last visit, except for the addition  of amlodipine and fish oil.  He has nitroglycerin, but generally does  not use it.   PHYSICAL EXAMINATION:  GENERAL:  A pleasant gentleman in no acute  distress.  VITAL SIGNS:  The weight is 177, unchanged.  Blood pressure 125/80,  heart rate 60 and regular, respirations 14.  NECK:  No jugulovenous distention; no carotid bruits appreciated.  LUNGS:  Clear.  CARDIAC:  Normal first and second heart sounds; grade 2/6 basilar  systolic ejection murmur.  ABDOMEN:  Soft and nontender; no  organomegaly.  EXTREMITIES:  No edema.   EKG:  Sinus bradycardia at a rate of 57; left atrial abnormality;  delayed R-wave progression, consistent with prior anteroseptal  infarction; prior inferior infarction; LVH; right ventricular conduction  delay.  Compared with October 29, 2007, inferior Q-waves more  prominent; R-wave progression more delayed.   A 6-minute walk test  was performed.  The patient covered 750 feet, which  is not bad.  O2 saturation remained 90-95% throughout.  EKG after  walking showed a heart rate increase only to 64.  There was no  significant increase in ST-segment abnormalities.   IMPRESSION:  [name] symptoms have changed somewhat, but are still  consistent with myocardial ischemia.  We do not document that on serial  EKGs before and after exercise.  He cannot perform a standard stress  test because of joint problems preventing walking on a treadmill.  He  has normal left ventricular systolic function.  I doubt that he has  congestive heart failure, but a BNP level will be obtained.  He may have  significant lung problems related to his rheumatoid arthritis.  PFTs and  an arterial blood gas will be obtained.  I will suggest that [name]  continue to try to exercise within his capability and will reassess this  nice gentleman again in 1 month.    Sincerely,      Gerrit Friends. [name], MD, [facility]  Electronically Signed    RMR/MedQ  DD: 01/26/2008  DT: 01/26/2008  Job #: [id number]

## 2011-02-05 NOTE — Letter (Signed)
November 26, 2007    Scott A. Gerda Diss, MD  [address]  [place]  [place] [address]   RE:  ARI, BERNABEI  MRN:  [id number]  /  DOB:  November 24, 1945   Dear [name],   Mr. Winthrop returns to the office for continued assessment and treatment  of coronary and vascular disease.  Since his last visit, he has  undergone both cardiac catheterization, and peripheral angiography.  The  former test found stable coronary disease with patent grafts except for  a total occlusion of a vein graft to the right coronary artery.  The  right vascular territory fills partially by collaterals.  Left  ventricular systolic function remains normal.  There are no valvular  abnormalities.  The angiograms of the iliacs and femorals show no  important focal disease.  There was a dissection on the left treated  with a stent; however, the patient's claudication is on the right.   Medication is unchanged from his last visit except for an increase in  niacin to a total of 3 g per day.   On exam, pleasant trim gentleman in no acute distress.  The weight is  177, unchanged.  Blood pressure 125/85, heart rate 64 and regular,  respirations 18.  Neck:  No jugular venous distention; faint left  carotid bruit.  Lungs:  Clear.  Cardiac:  Normal first and second heart  sounds; fourth heart sound present.  Abdomen:  Soft and nontender; no  bruits.  Extremities:  No edema.   Most recent lipid profile was improved with triglycerides falling from  499 to 327.  Total cholesterol remains acceptable at 198, HDL at 46 and  LDL at 83.   IMPRESSION:  Mr. Barrell is doing generally well.  He will start  amlodipine 5 mg daily to determine if this helps with his angina.  Fish  oil, a total of 4 g per day, will be added to his regime.  A lipid  profile will be obtained in 1 month.  I will see him, again, in 2  months.  In the meantime, we will determine whether carotid ultrasound  has been performed.  Ultimately, he will require a trial of  Pletal for  his claudication symptoms.    Sincerely,      Gerrit Friends. [name], MD, [facility]  Electronically Signed    RMR/MedQ  DD: 11/26/2007  DT: 11/26/2007  Job #: [id number]

## 2011-02-08 NOTE — Consult Note (Signed)
[name], [name]                  ACCOUNT NO.:  [id number]   MEDICAL RECORD NO.:  [id number]          PATIENT TYPE:  INP   LOCATION:  A222                          FACILITY:  [facility]   PHYSICIAN:  [place] Bing, M.D. [facility] OF BIRTH:  July 16, 1946   DATE OF CONSULTATION:  12/23/2005  DATE OF DISCHARGE:                                   CONSULTATION   PRIMARY CARDIOLOGIST:  Lyn Records, M.D.   HISTORY OF PRESENT ILLNESS:  A 65 year old gentleman with known coronary  disease presenting with nausea, emesis, diarrhea and chest tightness.  Mr.  Weightman cardiac history dates to the 1990s when he underwent CABG surgery.  He has not had any significant cardiovascular issues since that time.  He  was last evaluated by Dr. Katrinka Blazing some years ago.  We are seeking records to  document that information.  The patient has hyperlipidemia, but no known  hypertension.  He is receiving medical therapy for hyperlipidemia as well.  There is a remote history of cigarette smoking, but none for nearly 40  years.   PAST MEDICAL HISTORY:  Notable for rheumatoid arthritis from which he is  disabled.   ALLERGIES:  The patient has no known allergies.   MEDICATIONS PRIOR TO ADMISSION:  1.  Aspirin.  2.  Ibuprofen 800 mg t.i.d.  3.  Atorvastatin 80 mg daily.  4.  Fenofibrate 145 mg daily.  5.  Fluoxetine 20 mg daily.  6.  Xanax 0.5 mg q.h.s.  7.  Methotrexate 11.5 mg every week.  8.  Atenolol 25 mg daily.  9.  Levothyroxine 0.125 mg daily.  10. Nexium 40 mg daily.  11. Darvocet p.r.n.   SOCIAL HISTORY:  Married and lives in [place]; disabled; no excessive use  of alcohol.   FAMILY HISTORY:  Mother died at a young age due to neoplastic disease.  Father suffered a fatal myocardial infarction at age 56.  Of six siblings,  one has coronary disease, and one carcinoma of the bladder.   REVIEW OF SYSTEMS:  Notable for fever and chills, intermittent arthralgias  of the right hip.  All other systems  reviewed and are negative.   PHYSICAL EXAMINATION:  GENERAL:  On exam, a pleasant gentleman lying in bed  in no acute distress.  VITAL SIGNS:  The temperature is 97.1, heart rate 95 and regular,  respirations 18, blood pressure 120/65, weight 170, O2 saturation 99% on 2  liters.  HEENT:  Bilateral ptosis.  NECK:  No jugular venous distension; normal carotid upstrokes without to be  bruits.  LUNGS:  Clear.  CARDIAC:  Normal first and second heart sounds; fourth heart sound present.  SKIN:  No significant lesions.  HEMATOPOIETIC:  No adenopathy.  ABDOMEN:  Minor diffuse tenderness; soft and nontender; no organomegaly;  normal bowel sounds.  NEUROMUSCULAR:  Symmetric strength and tone; normal cranial nerves.  EXTREMITIES:  Rheumatic deformities of the joints.   EKG:  Normal sinus rhythm; left atrial abnormality; abnormal R wave  progression consistent with prior anterior myocardial infarction; prior  inferior myocardial infarction.   OTHER LABORATORY:  Notable for a creatinine of 1.6, normal serum glucose,  normal CBC, and normal cardiac markers.   IMPRESSION:  Mr. Boardley presents with symptoms that are highly suggestive of  a gastrointestinal origin.  The nausea and emesis could be due to myocardial  ischemia, but not likely the diarrhea.  He did develop lower left chest  tightness in association with the other symptoms.  This could reflect  ischemia mediated by his noncardiac primary illness.  Initial cardiac  markers and EKGs are negative for an acute process.  We will complete 24  hours of monitoring of serum markers.  Due to the presence of multiple  myocardial infarctions on his EKG, albeit not to acute, an echocardiogram  will be obtained.  We will check a fasting lipid profile as well.  Assuming  nothing remarkable comes from these basic tests, he can probably be  discharged once his primary gastrointestinal issues are addressed.  We will  likely perform an outpatient stress  test due to his known significant  ischemic heart disease, and long duration since last evaluation.  We will be  happy to follow this nice gentleman with you in hospital as well as  subsequent to discharge.      [place] Bing, M.D. [facility]  Electronically Signed     RR/MEDQ  D:  12/23/2005  T:  12/23/2005  Job:  [id number]

## 2011-02-08 NOTE — Procedures (Signed)
[name], [name]                  ACCOUNT NO.:  [id number]   MEDICAL RECORD NO.:  [id number]          PATIENT TYPE:  INP   LOCATION:  A222                          FACILITY:  [facility]   PHYSICIAN:  Edward L. Juanetta Gosling, M.D.DATE OF BIRTH:  [birth year]   DATE OF PROCEDURE:  DATE OF DISCHARGE:  12/25/2005                                EKG INTERPRETATION   Rhythm is sinus rhythm with a rate of 70.  There is probable left atrial  enlargement.  QT interval appears to be slightly long. This could be due to  electrolyte imbalance, drug effect or primary myocardial disease.  Abnormal  electrocardiogram.      Oneal Deputy. Juanetta Gosling, M.D.  Electronically Signed     ELH/MEDQ  D:  12/28/2005  T:  12/28/2005  Job:  [id number]

## 2011-02-08 NOTE — H&P (Signed)
[name], [name]                  ACCOUNT NO.:  [id number]   MEDICAL RECORD NO.:  [id number]          PATIENT TYPE:  INP   LOCATION:  A222                          FACILITY:  [facility]   PHYSICIAN:  Donna Bernard, M.D.DATE OF BIRTH:  1945-11-24   DATE OF ADMISSION:  12/23/2005  DATE OF DISCHARGE:  LH                                HISTORY & PHYSICAL   CHIEF COMPLAINT:  Vomiting and diarrhea and chest pain.   SUBJECTIVE:  This patient is a 65 year old white male with a history of  coronary artery disease, rheumatoid arthritis, hyperlipidemia, hypertension,  hypothyroidism, chronic reflux, anxiety, who presented to the hospital on  the night of admission with acute complaints.  The patient had been doing  relatively well until the afternoon prior to admission when he started to  develop nausea and vomiting.  This was followed by multiple episodes of  vomiting.  The patient also had multiple episodes of diarrhea.  He noted a  bit of abdominal cramping at times.  He also felt that he was experiencing a  bit of fever at times, noticing some mild chills.  No urinary symptoms.  The  patient has also noted chest pain.  He describes it as substernal at times,  aching at time, burning.  It seemed to worsen during periods of active  vomiting.   MEDICATIONS:  The patient claims compliance with his current medications,  which include:  1.  Lipitor 80 mg daily.  2.  Tricor 145 mg daily.  3.  Fluoxetine 20 mg daily.  4.  Xanax 0.5 mg q.h.s.  5.  Methotrexate 11.5 mg once a week.  6.  Atenolol 25 mg daily.  7.  Synthroid 0.125 mg daily.  8.  Nexium 40 mg daily.  9.  Darvocet one q.4-6h. p.r.n. for pain.   ALLERGIES:  None known.   PRIOR SURGERIES:  1.  Coronary artery bypass surgery x6 remotely.  2.  Colonoscopy in 2003.  3.  Multiple orthopedic operations over the last 20 years.   FAMILY HISTORY:  Significant for coronary artery disease, hyperlipidemia,  colon cancer.   SOCIAL  HISTORY:  The patient is married.  One child.  No tobacco use.  No  alcohol use.   REVIEW OF SYSTEMS:  Otherwise negative.   PHYSICAL EXAMINATION:  VITAL SIGNS:  Temperature 100.1, blood pressure  110/56, respiratory rate 26, pulse rate 80.  GENERAL:  The patient is somewhat drowsy post Phenergan injection with  moderate malaise.  HEENT:  Mucous membranes are somewhat dry.  NECK:  Supple.  LUNGS:  Clear.  HEART:  Regular rate and rhythm.  CHEST:  Nontender.  ABDOMEN:  Soft, diffuse tenderness to palpation in all quadrants.  Bowel  sounds present.  EXTREMITIES:  Thin.  No edema.  Positive changes of rheumatoid arthritis  noted.   SIGNIFICANT LABORATORIES:  CBC revealed a white blood count of 15.7,  hemoglobin 14.5, platelets 266.  INR normal at 1.1.  MET-7 of 4.4 potassium,  glucose 103, creatinine 1.6.  Cardiac enzymes x2 two hours apart.  Negative.  EKG -  some slight ST elevation noted in II, III, aVF.  Chronicity is  uncertain.  No old EKG's available for comparison.   IMPRESSION:  1.  Acute vomiting, diarrhea, abdominal pain, likely gastroenteritis versus      food poisoning, leaning towards gastroenteritis.  With diffuse      tenderness in the abdomen and elevated white blood count, will press on      and so a scan to further elucidate etiology and make sure nothing else      is going on.  2.  Chest pain, likely gastrointestinal in nature; however, feel that      cardiology consultation warranted due to patient's very significant      coronary history.  3.  Coronary artery disease.  4.  Hypothyroidism.  5.  Hypertension.   PLAN:  Hydration, nausea control.  Further tests as noted.  Further orders  as noted on the chart.      Donna Bernard, M.D.  Electronically Signed     WSL/MEDQ  D:  12/23/2005  T:  12/24/2005  Job:  [id number]

## 2011-02-08 NOTE — Procedures (Signed)
[name], [name]                  ACCOUNT NO.:  [id number]   MEDICAL RECORD NO.:  [id number]          PATIENT TYPE:  OUT   LOCATION:  RESP                          FACILITY:  [facility]   PHYSICIAN:  Edward L. Juanetta Gosling, M.D.DATE OF BIRTH:  1946-03-01   DATE OF PROCEDURE:  DATE OF DISCHARGE:  02/03/2008                            PULMONARY FUNCTION TEST   1. Spirometry shows no definite ventilatory defect or airflow      obstruction.  2. Lung volumes show minimal reduction in total lung capacity with 80%      being normal, and [name] has been 78%.  3. DLCO is moderately reduced.  4. Arterial blood gases are normal.      Edward L. Juanetta Gosling, M.D.  Electronically Signed     ELH/MEDQ  D:  02/06/2008  T:  02/06/2008  Job:  [id number]   cc:   Gerrit Friends. [name], MD, [facility]  [address]  [place], [place] [address]

## 2011-02-08 NOTE — Discharge Summary (Signed)
NAMELASHAN, [name]                  ACCOUNT NO.:  [id number]   MEDICAL RECORD NO.:  [id number]          PATIENT TYPE:  INP   LOCATION:  A222                          FACILITY:  [facility]   PHYSICIAN:  Donna Bernard, M.D.DATE OF BIRTH:  10/02/1945   DATE OF ADMISSION:  12/23/2005  DATE OF DISCHARGE:  04/04/2007LH                                 DISCHARGE SUMMARY   DIAGNOSES:  1.  Chest pain, myocardial infarction ruled out.  2.  Acute gastroenteritis.  3.  Coronary artery disease.  4.  Hyperlipidemia.  5.  Rheumatoid arthritis.  6.  Chronic reflux.  7.  Hypertension.   FINAL DISPOSITION:  1.  The patient discharged to home.  2.  DISCHARGE MEDICATIONS:  Maintain all usual medicines.  3.  Follow up in the office in 1 week.   INITIAL HISTORY AND PHYSICAL:  Please see H&P as dictated.   HOSPITAL COURSE:  This patient is a 65 year old white male with history of  known coronary artery disease, along with hyperlipidemia, hypertension and  rheumatoid arthritis who presented to the hospital the day of admission with  acute chest pain.  Over the prior 24 hours, the patient developed some  significant nausea and vomiting.  He had multiple episodes of vomiting,  along with multiple episodes of diarrhea.  Soon after this, he started  getting chest discomfort, which he described as a deep ache and burning, and  pressure.  No significant shortness of breath or diaphoresis.  The patient  did notice a low-grade fever.  He was admitted to the hospital and given IV  fluids.  We did serial cardiac enzymes and these returned negative.  With  his cardiac history, we consulted the cardiologist who felt the discomfort  was likely gastroesophageal in origin.  The patient's white blood count was  initially elevated.  On repeat, 24 hours after admission, it was coming down  to within normal limits.  The patient gradually improved.  On the day of  discharge, he was discharged stable and sent home with  diagnosis and  disposition as noted above.      Donna Bernard, M.D.  Electronically Signed     WSL/MEDQ  D:  01/14/2006  T:  01/15/2006  Job:  [id number]

## 2011-02-08 NOTE — Procedures (Signed)
[name], [name]                  ACCOUNT NO.:  [id number]   MEDICAL RECORD NO.:  [id number]          PATIENT TYPE:  INP   LOCATION:  A222                          [facility]:  [facility]   PHYSICIAN:  Edward L. Juanetta Gosling, M.D.DATE OF BIRTH:  01-02-1946   DATE OF PROCEDURE:  12/23/2005  DATE OF DISCHARGE:                                EKG INTERPRETATION   TIME:  5:56 on December 23, 2005.   The rhythm is sinus rhythm with a rate of 90.  There is probable left atrial  enlargement.  There is an incomplete right bundle branch block.  There is  left ventricular hypertrophy with strain pattern.  Q-waves seen inferiorly  which could indicate a previous inferior infarction, and clinical  correlation is suggested.   Abnormal electrocardiogram      Edward L. Juanetta Gosling, M.D.  Electronically Signed     ELH/MEDQ  D:  12/23/2005  T:  12/23/2005  Job:  [id number]

## 2011-02-08 NOTE — Procedures (Signed)
NAMEMARCELLIUS, MONTAGNA                  ACCOUNT NO.:  [id number]   MEDICAL RECORD NO.:  [id number]          PATIENT TYPE:  INP   LOCATION:  A222                          [facility]:  [facility]   PHYSICIAN:  [place] Bing, M.D. [facility] OF BIRTH:  09/12/1946   DATE OF PROCEDURE:  12/23/2005  DATE OF DISCHARGE:                                  ECHOCARDIOGRAM   REFERRING PHYSICIAN:  Donna Bernard, M.D.   CLINICAL DATA:  A 65 year old gentleman with chest pain and coronary  disease; prior CABG surgery.   M-MODE:  Aorta 3.2, left atrium 4.4, septum 1.4, posterior wall 1.3, LV  diastole 3.8, LV systole 2.4.   1.  Technically adequate echocardiographic study.  2.  Right atrium not well seen, probably normal in size.  Mild left atrial      enlargement.  3.  Normal right ventricular size and function; normal RV wall thickness.  4.  Minimal sclerosis of a trileaflet aortic valve with very mild      calcification of the proximal ascending aorta.  5.  Normal tricuspid valve; trivial regurgitation.  6.  Very mild mitral valve thickening with mild annular calcification and      minimal regurgitation.  7.  Normal pulmonic valve and proximal pulmonary artery.  8.  Normal IVC.  9.  Normal left ventricular size; mild to moderate hypertrophy; mildly      dyskinetic motion of a small segment at the base of the inferior wall.      Overall LV systolic function remains normal.      Butts Bing, M.D. [facility]  Electronically Signed     RR/MEDQ  D:  12/23/2005  T:  12/23/2005  Job:  [id number]

## 2011-05-02 ENCOUNTER — Other Ambulatory Visit: Payer: Self-pay | Admitting: Rheumatology

## 2011-05-02 DIAGNOSIS — M25512 Pain in left shoulder: Secondary | ICD-10-CM

## 2011-05-08 ENCOUNTER — Ambulatory Visit
Admission: RE | Admit: 2011-05-08 | Discharge: 2011-05-08 | Disposition: A | Discharge status: Home / Self Care | Payer: Medicare Other | Source: Ambulatory Visit | Attending: Rheumatology | Admitting: Rheumatology

## 2011-05-08 ENCOUNTER — Other Ambulatory Visit: Payer: Self-pay

## 2011-05-08 DIAGNOSIS — M25512 Pain in left shoulder: Secondary | ICD-10-CM

## 2011-06-05 ENCOUNTER — Ambulatory Visit (INDEPENDENT_AMBULATORY_CARE_PROVIDER_SITE_OTHER): Payer: Medicare Other | Admitting: Physician Assistant

## 2011-06-05 ENCOUNTER — Encounter: Payer: Self-pay | Admitting: Physician Assistant

## 2011-06-05 ENCOUNTER — Other Ambulatory Visit: Payer: Self-pay | Admitting: Cardiology

## 2011-06-05 DIAGNOSIS — Z01818 Encounter for other preprocedural examination: Secondary | ICD-10-CM

## 2011-06-05 DIAGNOSIS — R0989 Other specified symptoms and signs involving the circulatory and respiratory systems: Secondary | ICD-10-CM

## 2011-06-05 DIAGNOSIS — I2510 Atherosclerotic heart disease of native coronary artery without angina pectoris: Secondary | ICD-10-CM

## 2011-06-05 DIAGNOSIS — I10 Essential (primary) hypertension: Secondary | ICD-10-CM | POA: Insufficient documentation

## 2011-06-05 DIAGNOSIS — I679 Cerebrovascular disease, unspecified: Secondary | ICD-10-CM | POA: Insufficient documentation

## 2011-06-05 DIAGNOSIS — I779 Disorder of arteries and arterioles, unspecified: Secondary | ICD-10-CM

## 2011-06-05 MED ORDER — LISINOPRIL 40 MG PO TABS: 40.0000 mg | ORAL_TABLET | Freq: Every day | ORAL | Status: DC

## 2011-06-05 NOTE — Patient Instructions (Addendum)
Your physician has requested that you have a carotid duplex. This test is an ultrasound of the carotid arteries in your neck. It looks at blood flow through these arteries that supply the brain with blood. Allow one hour for this exam. There are no restrictions or special instructions.  Your physician has requested that you have a lexiscan myoview. For further information please visit [email or web address] Please follow instruction sheet, as given.  Your physician has recommended you make the following change in your medication: increase Lisinopril to 40 mg daily  Your physician recommends that you schedule a follow-up appointment in: 2 to 3 months

## 2011-06-05 NOTE — Assessment & Plan Note (Signed)
Will repeat carotid Dopplers

## 2011-06-05 NOTE — Progress Notes (Signed)
HPI: This is a 64 year old white male patient with history of coronary artery disease status post CABG in 1998 with LIMA to the LAD and diagonal, SVG to the OM1 and OM-2, and SVG to the RCA and PDA. His last catheter was in 2009 showed progression of his native disease as well as a known occlusion of his SVG to PDA and RCA. Ejection fraction was 60% with no large focal wall motion abnormality and he was treated medically.  The patient had an admission in January of this year with chest pain that was felt to be GI related. He had a 2-D echo at that time that showed normal LV function ejection fraction 60-65% with mild LVH and grade 2 diastolic dysfunction.  He is here today for pre-op surgical clearance before undergoing left rotator cuff surgery.  The patient denies any chest pain. He does admit to dyspnea on exertion when walking fast or going up a hill, or if he walks after he eats.  Allergies  Allergen Reactions  . Doxycycline   . Levofloxacin     Current Outpatient Prescriptions on File Prior to Visit  Medication Sig Dispense Refill  . lisinopril (PRINIVIL,ZESTRIL) 40 MG tablet Take 1 tablet (40 mg total) by mouth daily. Increase in dose  90 tablet  1    Past Medical History  Diagnosis Date  . Coronary artery disease   . Rheumatoid arthritis   . Hyperlipidemia     Past Surgical History  Procedure Date  . Coronary artery bypass graft   . Cardiac catheterization     No family history on file.  History   Social History  . Marital Status: Married    Spouse Name: N/A    Number of Children: N/A  . Years of Education: N/A   Occupational History  . Not on file.   Social History Main Topics  . Smoking status: Former Smoker    Quit date: 09/23/1965  . Smokeless tobacco: Not on file  . Alcohol Use: Not on file  . Drug Use: Not on file  . Sexually Active: Not on file   Other Topics Concern  . Not on file   Social History Narrative  . No narrative on file     ROS: See HPI Eyes: Negative Ears:Negative for hearing loss, tinnitus Cardiovascular: Negative for chest pain, palpitations,irregular heartbeat, dyspnea, dyspnea on exertion, near-syncope, orthopnea, paroxysmal nocturnal dyspnia and syncope,edema, claudication, cyanosis,.  Respiratory:   Negative for cough, hemoptysis, shortness of breath, sleep disturbances due to breathing, sputum production and wheezing.   Endocrine: Negative for cold intolerance and heat intolerance.  Hematologic/Lymphatic: Negative for adenopathy and bleeding problem. Does not bruise/bleed easily.  Musculoskeletal: Negative.   Gastrointestinal: Negative for nausea, vomiting, reflux, abdominal pain, diarrhea, constipation.   Genitourinary: Negative for bladder incontinence, dysuria, flank pain, frequency, hematuria, hesitancy, nocturia and urgency.  Neurological: Negative.  Allergic/Immunologic: Negative for environmental allergies.   PHYSICAL EXAM: Well-nournished, in no acute distress. Neck: No JVD, HJR, Bruit, or thyroid enlargement Lungs: No tachypnea, clear without wheezing, rales, or rhonchi Cardiovascular: RRR, PMI not displaced, heart sounds normal, no murmurs, gallops, bruit, thrill, or heave. Abdomen: BS normal. Soft without organomegaly, masses, lesions or tenderness. Extremities: without cyanosis, clubbing or edema. Good distal pulses bilateral SKin: Warm, no lesions or rashes  Musculoskeletal: No deformities Neuro: no focal signs  BP 167/83  Pulse 78  Ht 5\' 8"  (1.727 m)  Wt 177 lb (80.287 kg)  BMI 26.91 kg/m2  SpO2 96%  EKG: ECHO:09/27/10  Study Conclusions   Left ventricle: The cavity size was normal. Wall thickness was   increased in a pattern of mild LVH. Systolic function was normal.   The estimated ejection fraction was in the range of 60% to 65%.   Features are consistent with a pseudonormal left ventricular filling   pattern, with concomitant abnormal relaxation and increased filling    pressure (grade 2 diastolic dysfunction).

## 2011-06-05 NOTE — Assessment & Plan Note (Signed)
Patient is scheduled for left rotator cuff surgery. He does have long history of coronary artery disease as stated above. He does have symptoms of dyspnea on exertion. We will order Lexiscan prior to clearing him for surgery.

## 2011-06-05 NOTE — Assessment & Plan Note (Signed)
Patient's blood pressure is elevated today. We will increase his lisinopril to 40 mg daily. He will follow-up with Dr. Guy Sandifer on Wednesday.

## 2011-06-05 NOTE — Assessment & Plan Note (Addendum)
Patient has the above stated coronary artery disease. He does not have any chest pain.He does have dyspnea on exertion. He is here for pre-op surgical clearance before undergoing left rotator cuff surgery. We will order a LEXi scan before clearing him for surgery.

## 2011-06-06 ENCOUNTER — Ambulatory Visit: Payer: Medicare Other | Admitting: Cardiology

## 2011-06-12 ENCOUNTER — Other Ambulatory Visit: Payer: Self-pay | Admitting: Cardiology

## 2011-06-12 DIAGNOSIS — I2510 Atherosclerotic heart disease of native coronary artery without angina pectoris: Secondary | ICD-10-CM

## 2011-06-14 ENCOUNTER — Ambulatory Visit (INDEPENDENT_AMBULATORY_CARE_PROVIDER_SITE_OTHER): Payer: Medicare Other | Admitting: *Deleted

## 2011-06-14 ENCOUNTER — Encounter (HOSPITAL_COMMUNITY)
Admission: RE | Admit: 2011-06-14 | Discharge: 2011-06-14 | Disposition: A | Discharge status: Home / Self Care | Payer: Medicare Other | Source: Ambulatory Visit | Attending: Cardiology | Admitting: Cardiology

## 2011-06-14 ENCOUNTER — Ambulatory Visit (HOSPITAL_COMMUNITY)
Admission: RE | Admit: 2011-06-14 | Discharge: 2011-06-14 | Disposition: A | Discharge status: Home / Self Care | Payer: Medicare Other | Source: Ambulatory Visit | Attending: Cardiology | Admitting: Cardiology

## 2011-06-14 ENCOUNTER — Encounter (HOSPITAL_COMMUNITY): Payer: Self-pay | Admitting: Cardiology

## 2011-06-14 DIAGNOSIS — E119 Type 2 diabetes mellitus without complications: Secondary | ICD-10-CM | POA: Insufficient documentation

## 2011-06-14 DIAGNOSIS — I2510 Atherosclerotic heart disease of native coronary artery without angina pectoris: Secondary | ICD-10-CM

## 2011-06-14 DIAGNOSIS — R0989 Other specified symptoms and signs involving the circulatory and respiratory systems: Secondary | ICD-10-CM

## 2011-06-14 DIAGNOSIS — Z01818 Encounter for other preprocedural examination: Secondary | ICD-10-CM

## 2011-06-14 DIAGNOSIS — R0789 Other chest pain: Secondary | ICD-10-CM | POA: Insufficient documentation

## 2011-06-14 DIAGNOSIS — Z01810 Encounter for preprocedural cardiovascular examination: Secondary | ICD-10-CM | POA: Insufficient documentation

## 2011-06-14 DIAGNOSIS — I6529 Occlusion and stenosis of unspecified carotid artery: Secondary | ICD-10-CM | POA: Insufficient documentation

## 2011-06-14 DIAGNOSIS — I10 Essential (primary) hypertension: Secondary | ICD-10-CM | POA: Insufficient documentation

## 2011-06-14 DIAGNOSIS — Z951 Presence of aortocoronary bypass graft: Secondary | ICD-10-CM | POA: Insufficient documentation

## 2011-06-14 LAB — COMPREHENSIVE METABOLIC PANEL
ALT: 24
AST: 28
CO2: 25
Chloride: 107
Creatinine, Ser: 1.11
GFR calc Af Amer: >60
GFR calc non Af Amer: >60
Total Bilirubin: 0.6

## 2011-06-14 LAB — CBC
HCT: 38.6 — ABNORMAL LOW
Hemoglobin: 13.4
MCV: 100.2 — ABNORMAL HIGH
RBC: 3.85 — ABNORMAL LOW
WBC: 8.6

## 2011-06-14 LAB — APTT: aPTT: 24

## 2011-06-14 MED ORDER — TECHNETIUM TC 99M TETROFOSMIN IV KIT
30.0000 | PACK | Freq: Once | INTRAVENOUS | Status: AC | PRN
  Administered 2011-06-14: 31 via INTRAVENOUS

## 2011-06-14 MED ORDER — TECHNETIUM TC 99M TETROFOSMIN IV KIT
10.0000 | PACK | Freq: Once | INTRAVENOUS | Status: AC | PRN
  Administered 2011-06-14: 10.4 via INTRAVENOUS

## 2011-06-14 NOTE — Progress Notes (Addendum)
Stress Lab Nurses Notes - [name]  [name] 06/14/2011  Reason for doing test: CAD and Surgical Clearance  Type of test: Steffanie Dunn  Nurse performing test: Parke Poisson, RN  Nuclear Medicine Tech: Dillard Essex  Echo Tech: Not Applicable  MD performing test: R. [name]  Family MD: Lubertha South  Test explained and consent signed: yes  IV started: 22g jelco, Saline lock flushed, No redness or edema and Saline lock started in radiology  Symptoms: Flushed in face & headache   Treatment/Intervention: None  Reason test stopped: protocol completed  After recovery IV was: Discontinued via X-ray tech and No redness or edema  Patient to return to Nuc. Med at :11:45 am  Patient discharged: Home  Patient's Condition upon discharge was: stable  Comments: During BP 168/88 & HR 111.  Recovery BP 160/82 & HR 89. Symptoms resolved in recovery.  Erskine Speed T  Please see dictated report for Stress Nuclear Study.

## 2011-06-19 LAB — BLOOD GAS, ARTERIAL
Acid-base deficit: 5.5 — ABNORMAL HIGH
TCO2: 16.3
pCO2 arterial: 30.4 — ABNORMAL LOW
pO2, Arterial: 87.9

## 2011-06-20 LAB — COMPREHENSIVE METABOLIC PANEL
ALT: 19
AST: 22
CO2: 24
Calcium: 9.2
Chloride: 106
GFR calc Af Amer: >60
GFR calc non Af Amer: >60
Potassium: 4.4
Sodium: 137
Total Bilirubin: 1

## 2011-06-20 LAB — URINALYSIS, ROUTINE W REFLEX MICROSCOPIC
Glucose, UA: NEGATIVE
Ketones, ur: NEGATIVE
Leukocytes, UA: NEGATIVE
Protein, ur: NEGATIVE

## 2011-06-20 LAB — INFLUENZA A+B VIRUS AG-DIRECT(RAPID)
Inflenza A Ag: POSITIVE — AB
Influenza B Ag: NEGATIVE

## 2011-06-20 LAB — DIFFERENTIAL
Eosinophils Absolute: 0.1
Eosinophils Relative: 1
Lymphs Abs: 1.2

## 2011-06-20 LAB — CBC
RBC: 4 — ABNORMAL LOW
WBC: 17.1 — ABNORMAL HIGH

## 2011-06-25 ENCOUNTER — Telehealth: Payer: Self-pay | Admitting: Cardiology

## 2011-06-25 NOTE — Telephone Encounter (Signed)
**Note De-Identified  Obfuscation** S: Pt. was seen by Leda Gauze, PAC on 9-12 for surgical clearance B: On last OV with Jacolyn Reedy, PAC pt. was advised to have a Lexiscan and carotid duplex done before surgical clearance could be given.  A: Pt. had both the Lexiscan and Carotid duplex performed and both tests were normal. Pt. is to have a left rotator cuff surgery with Dr. Chaney Malling of [facility] in [place], [place] under general anesthesia and will be out patient. R: Pt. advised that we will contact him when clearance is given. Please advise.

## 2011-06-25 NOTE — Telephone Encounter (Signed)
PT IS STILL WAITING ON CLEARANCE LETTER TO HAVE SHOULDER SURGERY/TMJ

## 2011-06-25 NOTE — Telephone Encounter (Signed)
S: Pt. saw Leda Gauze, PAC on 9-12 for surgical clearance

## 2011-06-27 NOTE — Telephone Encounter (Signed)
Stress nuclear study shows a single area of scarring in the basilar in for her wall, known to be supplied by totally occluded RCA and graft.  There is minimal superimposed ischemia.  As noted in the report, this represents a low risk study with respect to the possibility of a perioperative coronary event.  Carotid ultrasound showed no significant atherosclerotic disease.  Proposed orthopaedic procedure is relatively low risk and may proceed with somewhat increased but acceptable likelihood for perioperative cardiac complications.

## 2011-06-28 LAB — DIFFERENTIAL
Eosinophils Relative: 2
Lymphocytes Relative: 2 — ABNORMAL LOW
Lymphs Abs: 0.3 — ABNORMAL LOW
Monocytes Absolute: 1
Monocytes Relative: 6

## 2011-06-28 LAB — CBC
HCT: 44.2
Hemoglobin: 15
RBC: 4.45
WBC: 17 — ABNORMAL HIGH

## 2011-06-28 LAB — BASIC METABOLIC PANEL
GFR calc Af Amer: 51 — ABNORMAL LOW
GFR calc non Af Amer: 42 — ABNORMAL LOW
Glucose, Bld: 121 — ABNORMAL HIGH
Potassium: 4
Sodium: 142

## 2011-07-17 ENCOUNTER — Other Ambulatory Visit (HOSPITAL_COMMUNITY): Payer: Self-pay | Admitting: Orthopaedic Surgery

## 2011-07-17 ENCOUNTER — Other Ambulatory Visit: Payer: Self-pay | Admitting: Orthopaedic Surgery

## 2011-07-17 DIAGNOSIS — M25511 Pain in right shoulder: Secondary | ICD-10-CM

## 2011-07-22 ENCOUNTER — Other Ambulatory Visit (HOSPITAL_COMMUNITY): Payer: Medicare Other

## 2011-07-23 ENCOUNTER — Ambulatory Visit
Admission: RE | Admit: 2011-07-23 | Discharge: 2011-07-23 | Disposition: A | Discharge status: Home / Self Care | Payer: Medicare Other | Source: Ambulatory Visit | Attending: Orthopaedic Surgery | Admitting: Orthopaedic Surgery

## 2011-07-23 DIAGNOSIS — M25511 Pain in right shoulder: Secondary | ICD-10-CM

## 2011-07-29 ENCOUNTER — Telehealth: Payer: Self-pay | Admitting: Cardiology

## 2011-07-29 NOTE — Telephone Encounter (Signed)
All Cardiac faxed to [facility] @ [phone]  07/29/11

## 2011-08-06 ENCOUNTER — Encounter: Payer: Self-pay | Admitting: Cardiology

## 2011-08-12 ENCOUNTER — Ambulatory Visit: Payer: Medicare Other | Admitting: Cardiology

## 2011-08-20 ENCOUNTER — Ambulatory Visit (INDEPENDENT_AMBULATORY_CARE_PROVIDER_SITE_OTHER): Payer: Medicare Other | Admitting: Urgent Care

## 2011-08-20 ENCOUNTER — Encounter: Payer: Self-pay | Admitting: Urgent Care

## 2011-08-20 DIAGNOSIS — R197 Diarrhea, unspecified: Secondary | ICD-10-CM

## 2011-08-20 DIAGNOSIS — K219 Gastro-esophageal reflux disease without esophagitis: Secondary | ICD-10-CM

## 2011-08-20 DIAGNOSIS — K529 Noninfective gastroenteritis and colitis, unspecified: Secondary | ICD-10-CM

## 2011-08-20 DIAGNOSIS — R1310 Dysphagia, unspecified: Secondary | ICD-10-CM | POA: Insufficient documentation

## 2011-08-20 NOTE — Patient Instructions (Signed)
Colonoscopy and EGD with possible dilation of your esophagus in the near future Stop Prilosec and begin Nexium 40 mg before breakfast and dinner. I will review lab work from Dr. Fletcher Anon office and call if any further blood work is needed. Drink plenty of fluids. Limit your use of ibuprofen.  Dysphagia Swallowing problems (dysphagia) occur when solids and liquids seem to stick in your throat on the way down to your stomach, or the food takes longer to get to the stomach. Other symptoms (problems) include regurgitating (burping) up food, noises coming from the throat, chest discomfort with swallowing, and a feeling of fullness in the throat when swallowing. When blockage in the throat is complete it may be associated with drooling. CAUSES There are many causes of swallowing difficulties and the following is generalized information regarding a number of reasons for this problem. Problems with swallowing may occur because of problems with the muscles. The food cannot be propelled in the usual manner into the stomach. There may be ulcers, scar tissueor inflammation (soreness) in the esophagus (the food tube from the mouth to the stomach) which blocks food from passing normally into the stomach. Causes of inflammation include acid reflux from the stomach into the esophagus. Inflammation can also be caused by the herpes simplex virus, Candida (yeast), radiation (as with treatment of cancer), or inflammation from medications not taken with adequate fluids to wash them down into the stomach. There may be nerve problems so signals cannot be sent adequately telling the muscles of the esophagus to contract and move the food along. Achalasia is a rare disorder of the esophagus in which muscular contractions of the esophagus are uncoordinated. Globus hystericus is a relatively common problem in young females in which there is a sense of an obstruction or difficulty in swallowing, but in which no abnormalities can be  found. This problem usually improves over time with reassurance and testing to rule out other causes. DIAGNOSIS A number of tests will help your caregiver know what is the cause of your swallowing problems. These tests may include a barium swallow in which x-rays are taken while you are drinking a liquid that outlines the lining of the esophagus on x-ray. If the stomach and small bowel are also studied in this manner it is called an upper gastrointestinal exam (UGI). Endoscopy may be done in which your caregiver examines your throat, esophagus, stomach and small bowel with an instrument like a small flexible telescope. Motility studies which measure the effectiveness and coordination of the muscular contractions of the esophagus may also be done. TREATMENT The treatment of swallowing problems are many, varying from medications to surgical treatment. The treatment varies with the type of problem found. Your caregiver will discuss your results and treatment with you. If swallowing problems are severe the long term problems which may occur include: malnutrition, pneumonia (from food going into the breathing tubes called trachea and bronchi), and an increase in tumors (lumps) of the esophagus. SEEK IMMEDIATE MEDICAL CARE IF:  Food or other object becomes lodged in your throat or esophagus and won't move.  Document Released: 09/06/2000 Document Revised: 05/22/2011 Document Reviewed: 04/27/2008 [facility] Patient Information 2012 [place], [place].Chronic Diarrhea Diarrhea is loose, watery stools. Having diarrhea means passing loose stools 3 or more times a day. Diarrhea that lasts longer than 4 weeks is considered long-lasting (chronic). Symptoms of chronic diarrhea may be continual or may come and go. People of all ages can get diarrhea. Body fluid loss (dehydration) may occur as a  result of diarrhea. This means the body does not have as many fluids and salts (electrolytes) as it needs. CAUSES  There are many  causes of chronic diarrhea. Causes may be different for children and adults. The various causes can be grouped into 2 categories: diarrhea caused by an infection and diarrhea not caused by an infection. Sometimes, the cause is unknown. Diarrhea caused by an infection may result from:  Parasites.   Bacteria.   Viral infections.  Diarrhea not caused by an infection may result from:  Irritable bowel syndrome.   Reaction to medicines, such as antibiotics, cancer drugs, blood pressure medicines, and antacids.   Intestinal disease (Crohn's disease, ulcerative colitis, celiac disease).   Food allergies or sensitivity to additives (fructose, lactose, sugar substitutes).   Tumors.   Diabetes, thyroid disease, and other endocrine diseases.   Reduced blood flow to the intestine.   Previous surgery or radiation of the abdomen or gastrointestinal tract.  Risk factors for chronic diarrhea include:  Having a severely weakened immune system, such as from HIV/AIDS.   Taking certain types of cancer-fighting drugs (chemotherapy) or other medicines.   A recent organ transplant.   Having a portion of the stomach removed.   Traveling to countries where food and water supplies are often contaminated.  SYMPTOMS  In addition to frequent, loose stools, diarrhea may cause:  Cramping.   Abdominal pain.   Nausea.   Urgent need to use the bathroom, or loss of bowel control.  If dehydration occurs, problems include:  Thirst.   Less frequent urination.   Dark urine.   Dry skin.   Fatigue.   Dizziness.  Infections that cause diarrhea may also cause a fever, chills, or bloody stools. DIAGNOSIS  Diagnosis may be difficult. Your caregiver must take a careful history and perform a physical exam. Tests given are based on your symptoms and history. Tests may include:  Blood or stool tests, in which 3 or more stool samples may be examined. Stool cultures may be used to test for bacteria or  parasites.   X-rays.   A procedure in which a thin tube is inserted into the mouth or rectum (endoscopy). This allows the caregiver to look inside the intestine.  TREATMENT   Diarrhea caused by an infection can often be treated with antibiotics.   Diarrhea not caused by an infection is more difficult to diagnose and treat. Long-term medicine use or surgery may be required. Specific treatment should be discussed with your caregiver.   If the cause cannot determined, treatment to relieve symptoms includes:   Preventing dehydration. Serious health problems can occur if you do not maintain proper fluid levels. Many oral rehydration solutions (ORS) are available at drug stores. Ask your caregiver what product is best for you.   Not drinking beverages that contain caffeine (tea, coffee, soft drinks).   Not drinking alcohol. It causes dehydration.   Not relying on sports drinks and broths alone to maintain proper fluid levels. They should not be used to prevent severe dehydration.   Maintaining well-balanced nutrition. This may help you recover faster.  PREVENTION   Drink clean or purified water.   Use proper food handling techniques.   Maintain proper hand-washing habits.  HOME CARE INSTRUCTIONS   Avoid:   Caffeine.   Greasy foods.   High fiber.   If you have problems digesting lactose during or after an episode of diarrhea, you might want to try yogurt. Yogurt is often better tolerated, because it has  less lactose than milk. Yogurt with active, live bacterial cultures may even help you recover faster.  SEEK MEDICAL CARE IF:  The person with diarrhea is an otherwise healthy adult and has:  Signs of dehydration.   Diarrhea for more than 2 days.   Severe pain in the abdomen or rectum.   An oral temperature above 102 F (38.9 C).   Stools containing blood or pus.   Stools that are black and tarry.  SEEK IMMEDIATE MEDICAL CARE IF:  The person with diarrhea is a child,  elderly person, or has a weakened immune system and has:  Signs of dehydration.   Diarrhea for more than 1 day.   Severe pain in the abdomen or rectum.   An oral temperature above 102 F (38.9 C), not controlled by medicine.   Stools containing blood or pus.   Stools that are black and tarry.  Document Released: 11/30/2003 Document Revised: 05/22/2011 Document Reviewed: 01/26/2010 [facility] Patient Information 2012 [place], [place].

## 2011-08-20 NOTE — Assessment & Plan Note (Addendum)
[name] is a 65 y.o. male with history of chronic GERD and six-month history of dysphagia to both liquids and solids. He is going to need further evaluation with EGD with possible esophageal dilation to look for complicated gastroesophageal reflux disease including esophageal web, ring, stricture, or malignancy.  I have discussed risks & benefits which include, but are not limited to, bleeding, infection, perforation & drug reaction.  The patient agrees with this plan & written consent will be obtained.

## 2011-08-20 NOTE — Assessment & Plan Note (Signed)
Refractory symptoms despite twice a day omeprazole.  Resume Nexium 40 mg twice a day.

## 2011-08-20 NOTE — Assessment & Plan Note (Addendum)
He's had a six-month history of chronic diarrhea. This coincides with beginning Leflunomide.  Last colonoscopy believed to be over 65 yrs old.  Differentials include colorectal ca, microscopic colitis, drug side effect.  I doubt infectious etiology given chronicity of his symptoms.  Colonoscopy with possible random biopsies for further evaluation with Dr. Jena Gauss.  I have discussed risks & benefits which include, but are not limited to, bleeding, infection, perforation & drug reaction.  The patient agrees with this plan & written consent will be obtained.    I will review lab work from Dr. Fletcher Anon office and call if any further blood work is needed. Drink plenty of fluids. Limit your use of ibuprofen.

## 2011-08-20 NOTE — Progress Notes (Signed)
Referring Provider: Dr. Lilyan Punt Primary Care Physician:  [name], MD, MD Primary Gastroenterologist:  Dr. Jena Gauss  Chief Complaint  Patient presents with  . Dysphagia  . Diarrhea   HPI:  [name] is a 65 y.o. male here as a referral from Dr. Gerda Diss for dysphagia and diarrhea.  He gives a history of chronic GERD.  He states "I can't swalow food" for the past 6 mo. "It just won't go down."  He points to his mid esophagus. He is having problems w/ both liquids & solids getting stuck in upper-mid esophagus.  He c/o daily heartburn & indigestion.  He has been taking taking prilosec 20mg  BID for 3 yrs, but he had been on nexium but couldn't afford it.  He felt like the Nexium worked better. His symptoms are worse nocturnally.  He has also been having diarrhea & incontinence of stools x 6 months.  He believes his last colonoscopy was over 10 years ago. He has been having watery stools 3-4 per day.  No new meds except leflunomide for 6 months for his rheumatoid arthritis.  No foreign travel.  [place] water.  Wife has diarrhea but blames this on IBS.  Denies rectal bleeding or melena.  Unintentional Wt loss approximately 6# in past 6 mo.  His appetite has been decreased.  He has been taking IBU 800mg  BID for his RA.  Past Medical History  Diagnosis Date  . Coronary artery disease   . Rheumatoid arthritis   . Hyperlipidemia   . ASCVD (arteriosclerotic cardiovascular disease)   . Hypertension   . PVD (peripheral vascular disease)   . Cerebrovascular disease   . Tobacco abuse   . GERD (gastroesophageal reflux disease)   . Hypothyroid   . Allergic rhinitis   . Diabetes mellitus    Past Surgical History  Procedure Date  . Coronary artery bypass graft     x6  . Cardiac catheterization   . Revision total knee arthroplasty     Right  . Ankle fusion     Left  . Wrist fusion     Right  . Shoulder surgery    Current Outpatient Prescriptions  Medication Sig Dispense Refill  .  acetaminophen (TYLENOL) 500 MG tablet Take 1,000 mg by mouth daily.        Marland Kitchen amoxicillin (AMOXIL) 400 MG/5ML suspension Take 10 mLs by mouth Twice daily.      Marland Kitchen aspirin 81 MG tablet Take 81 mg by mouth daily.        Marland Kitchen atenolol (TENORMIN) 25 MG tablet Take 25 mg by mouth daily.        . cholecalciferol (VITAMIN D) 400 UNITS TABS Take 400 Units by mouth daily.        . folic acid (FOLVITE) 1 MG tablet Take 1 mg by mouth daily.        Marland Kitchen HYDROcodone-acetaminophen (NORCO) 5-325 MG per tablet Take 1 tablet by mouth every 4 (four) hours as needed.       Marland Kitchen ibuprofen (ADVIL,MOTRIN) 800 MG tablet Take 800 mg by mouth every 8 (eight) hours as needed.        . leflunomide (ARAVA) 20 MG tablet Take 20 mg by mouth daily.        Marland Kitchen levothyroxine (SYNTHROID) 112 MCG tablet Take 112 mcg by mouth daily.        Marland Kitchen lisinopril (PRINIVIL,ZESTRIL) 40 MG tablet Take 1 tablet (40 mg total) by mouth daily. Increase in dose  90 tablet  1  . Niacin 1000 MG TBCR Take 2,000 mg by mouth daily.        . Omega-3 Fatty Acids (FISH OIL) 1200 MG CAPS Take 1,200 mg by mouth daily.        . predniSONE (DELTASONE) 10 MG tablet Take 10 mg by mouth daily.        . simvastatin (ZOCOR) 80 MG tablet Take 80 mg by mouth at bedtime.         Allergies as of 08/20/2011 - Review Complete 08/20/2011  Allergen Reaction Noted  . Doxycycline Nausea And Vomiting 06/05/2011  . Levofloxacin Nausea And Vomiting 03/07/2008   Family History  Problem Relation Age of Onset  . Stomach cancer Mother 77  . Heart attack Father   . Heart disease Brother   . Leukemia Brother   . Lung disease Son     infant  . Lung disease Daughter     infant   History   Social History  . Marital Status: Married    Spouse Name: N/A    Number of Children: 1  . Years of Education: N/A   Occupational History  . retired [employer]    Social History Main Topics  . Smoking status: Former Smoker -- 0.5 packs/day for 5 years    Types: Cigarettes    Quit  date: 09/23/1965  . Smokeless tobacco: Not on file  . Alcohol Use: No  . Drug Use: No  . Sexually Active: Not on file  Review of Systems: Gen: Denies any fever, chills, sweats, anorexia, fatigue, weakness, malaise, weight loss, and sleep disorder CV: Denies chest pain, angina, palpitations, syncope, orthopnea, PND, peripheral edema, and claudication. Resp: Denies dyspnea at rest, dyspnea with exercise, cough, sputum, wheezing, coughing up blood, and pleurisy. GI: Denies vomiting blood & jaundice. GU : Denies urinary burning, blood in urine, urinary frequency, urinary hesitancy, nocturnal urination, and urinary incontinence. MS: Chronic joint pain, limitation of movement, and swelling, stiffness, low back pain, extremity pain. Denies muscle weakness, cramps, atrophy.  Derm: Denies rash, itching, dry skin, hives, moles, warts, or unhealing ulcers.  Psych: Denies depression, anxiety, memory loss, suicidal ideation, hallucinations, paranoia, and confusion. Heme: Denies bruising, bleeding, and enlarged lymph nodes.  Physical Exam: BP 138/81  Pulse 77  Temp(Src) 97.4 F (36.3 C) (Temporal)  Ht 5\' 8"  (1.727 m)  Wt 168 lb 12.8 oz (76.567 kg)  BMI 25.67 kg/m2 General:   Alert,  Well-developed, well-nourished, pleasant and cooperative in NAD.  Accompanied by his wife today. Head:  Normocephalic and atraumatic. Eyes:  Sclera clear, no icterus.   Conjunctiva pink. Ears:  Normal auditory acuity. Nose:  No deformity, discharge,  or lesions. Mouth:  No deformity or lesions, oropharynx pink & moist. Neck:  Supple; no masses or thyromegaly. Lungs:  Clear throughout to auscultation.   No wheezes, crackles, or rhonchi. No acute distress. Heart:  Regular rate and rhythm; no murmurs, clicks, rubs,  or gallops. Abdomen:  Soft, nontender and nondistended. No masses, hepatosplenomegaly or hernias noted. Normal bowel sounds, without guarding, and without rebound.   Rectal:  Deferred until time of  colonoscopy.   Msk:  Symmetrical without chronic arthritic changes. Pulses:  Normal pulses noted. Extremities:  Without clubbing or edema. Neurologic:  Alert and  oriented x4;  grossly normal neurologically. Skin:  Intact without significant lesions or rashes. Cervical Nodes:  No significant cervical adenopathy. Psych:  Alert and cooperative. Normal mood and affect.

## 2011-08-21 ENCOUNTER — Ambulatory Visit (HOSPITAL_COMMUNITY)
Admission: RE | Admit: 2011-08-21 | Discharge: 2011-08-21 | Disposition: A | Discharge status: Home / Self Care | Payer: Medicare Other | Source: Ambulatory Visit | Attending: Orthopaedic Surgery | Admitting: Orthopaedic Surgery

## 2011-08-21 DIAGNOSIS — M25619 Stiffness of unspecified shoulder, not elsewhere classified: Secondary | ICD-10-CM | POA: Insufficient documentation

## 2011-08-21 DIAGNOSIS — IMO0001 Reserved for inherently not codable concepts without codable children: Secondary | ICD-10-CM | POA: Insufficient documentation

## 2011-08-21 DIAGNOSIS — I10 Essential (primary) hypertension: Secondary | ICD-10-CM | POA: Insufficient documentation

## 2011-08-21 DIAGNOSIS — M6281 Muscle weakness (generalized): Secondary | ICD-10-CM | POA: Insufficient documentation

## 2011-08-21 DIAGNOSIS — M25519 Pain in unspecified shoulder: Secondary | ICD-10-CM | POA: Insufficient documentation

## 2011-08-21 DIAGNOSIS — Z9889 Other specified postprocedural states: Secondary | ICD-10-CM | POA: Insufficient documentation

## 2011-08-21 DIAGNOSIS — E119 Type 2 diabetes mellitus without complications: Secondary | ICD-10-CM | POA: Insufficient documentation

## 2011-08-21 NOTE — Progress Notes (Signed)
Occupational Therapy Evaluation  Patient Details  Name: [name] MRN: [id number] Date of Birth: April 06, 1946  Today's Date: 08/21/2011 Time: 1520-1550 Time Calculation (min): 30 min OT Eval 10' Manual Therapy 14' Visit#: 1  of 16   Re-eval: 09/18/11  Assessment Diagnosis: S/P Right Shoulder Scope Surgical Date: 08/01/11 Next MD Visit: 09/05/11 Prior Therapy: no  Past Medical History:  Past Medical History  Diagnosis Date  . Coronary artery disease   . Rheumatoid arthritis   . Hyperlipidemia   . ASCVD (arteriosclerotic cardiovascular disease)   . Hypertension   . PVD (peripheral vascular disease)   . Cerebrovascular disease   . Tobacco abuse   . GERD (gastroesophageal reflux disease)   . Hypothyroid   . Allergic rhinitis   . Diabetes mellitus    Past Surgical History:  Past Surgical History  Procedure Date  . Coronary artery bypass graft     x6  . Cardiac catheterization   . Revision total knee arthroplasty     Right  . Ankle fusion     Left  . Wrist fusion     Right  . Shoulder surgery     Subjective Symptoms/Limitations Symptoms: S:  "I want it to be better than it was." Limitations: [name] has had increased pain and decreased mobility in his right shoulder for more than 6 months.  He had a shoulder scope completed on 08/01/11, Dr.Whitfield attempted to repair his rotator coff, but was not able to.  He is referred to occupational therapy for evaluation and treatment with PROM only x 4 weeks postop. Pain Assessment Currently in Pain?: Yes Pain Score:   2 Pain Location: Shoulder Pain Orientation: Right Pain Type: Acute pain  Precautions/Restrictions  Precautions Precautions: Shoulder (PROM only for 4 weeks post-op)  Prior Functioning     Assessment RUE Assessment RUE Assessment:  (assessed in supine.  ER and IR in 0 abd) RUE AROM (degrees) Right Shoulder Flexion  0-170: 25 Degrees Right Shoulder ABduction 0-140: 50 Degrees Right Shoulder  Internal Rotation  0-70: 75 Degrees Right Shoulder External Rotation  0-90: 80 Degrees RUE PROM (degrees) Right Shoulder Flexion  0-170: 155 Degrees Right Shoulder ABduction 0-140: 90 Degrees Right Shoulder Internal Rotation  0-70: 75 Degrees Right Shoulder External Rotation  0-90: 80 Degrees  Exercise/Treatments Supine Protraction: PROM;10 reps Horizontal ABduction: PROM;10 reps External Rotation: PROM;10 reps Internal Rotation: PROM;10 reps Flexion: PROM;10 reps ABduction: PROM;10 reps Manual Therapy Manual Therapy: Myofascial release Myofascial Release: MFR and manual stretching to right upper arm, scapular, and shoulder region to decrease pain and restrictions and increase PROM in pain free range. [id number]   Occupational Therapy Assessment and Plan OT Assessment and Plan Clinical Impression Statement: A:  Patient presents with decreased AROM, strength and increased pain and restrictions S/P right shoulder scope.   Rehab Potential: Excellent OT Frequency: Min 2X/week OT Duration: 8 weeks OT Treatment/Interventions: Self-care/ADL training;Therapeutic exercise;Manual therapy;Therapeutic activities;Patient/family education;Other (comment) (Modalities, HEP:  towel slides) OT Plan: P:  Skilled OT intervention to decrease pain and restrictions and increase AROM and strength needed to return to full participation in ADLs.  Treatment plan:  MFR as outlined above.  Supine PROM, isometrics, bridges.  seated elev, ext, ret.  ball stretches.  progress as tolerated.   Goals   End of Session Patient Active Problem List  Diagnoses  . HYPOTHYROIDISM  . HYPERLIPIDEMIA  . ATHEROSCLEROTIC CARDIOVASCULAR DISEASE  . PERIPHERAL VASCULAR DISEASE  . ALLERGIC RHINITIS  . GASTROESOPHAGEAL REFLUX DISEASE  . RHEUMATOID  ARTHRITIS  . Coronary artery disease  . Carotid artery disease  . Preoperative examination, unspecified  . Hypertension  . Dysphagia  . Chronic diarrhea  . S/P arthroscopy of  shoulder  . Pain in joint, shoulder region  . Muscle weakness (generalized)   End of Session Activity Tolerance: Patient tolerated treatment well General Behavior During Session: [facility] for tasks performed Cognition: [facility] for tasks performed  Time Calculation Start Time: 1520 Stop Time: 1550 Time Calculation (min): 30 min  Shirlean Mylar, OTR/L  08/21/2011, 5:03 PM  Physician Documentation Your signature is required to indicate approval of the treatment plan as stated above.  Please sign and either send electronically or make a copy of this report for your files and return this physician signed original.  Please mark one 1.__approve of plan  2. ___approve of plan with the following conditions.   ______________________________                                                          _____________________ Physician Signature                                                                                                             Date

## 2011-08-21 NOTE — Progress Notes (Signed)
Cc to PCP 

## 2011-08-24 ENCOUNTER — Emergency Department (HOSPITAL_COMMUNITY)
Admission: EM | Admit: 2011-08-24 | Discharge: 2011-08-24 | Disposition: A | Discharge status: Home / Self Care | Payer: Medicare Other | Attending: Emergency Medicine | Admitting: Emergency Medicine

## 2011-08-24 ENCOUNTER — Encounter (HOSPITAL_COMMUNITY): Payer: Self-pay

## 2011-08-24 DIAGNOSIS — T7840XA Allergy, unspecified, initial encounter: Secondary | ICD-10-CM

## 2011-08-24 DIAGNOSIS — M069 Rheumatoid arthritis, unspecified: Secondary | ICD-10-CM | POA: Insufficient documentation

## 2011-08-24 DIAGNOSIS — R0602 Shortness of breath: Secondary | ICD-10-CM | POA: Insufficient documentation

## 2011-08-24 DIAGNOSIS — E119 Type 2 diabetes mellitus without complications: Secondary | ICD-10-CM | POA: Insufficient documentation

## 2011-08-24 DIAGNOSIS — Z8673 Personal history of transient ischemic attack (TIA), and cerebral infarction without residual deficits: Secondary | ICD-10-CM | POA: Insufficient documentation

## 2011-08-24 DIAGNOSIS — E785 Hyperlipidemia, unspecified: Secondary | ICD-10-CM | POA: Insufficient documentation

## 2011-08-24 DIAGNOSIS — I739 Peripheral vascular disease, unspecified: Secondary | ICD-10-CM | POA: Insufficient documentation

## 2011-08-24 DIAGNOSIS — R21 Rash and other nonspecific skin eruption: Secondary | ICD-10-CM | POA: Insufficient documentation

## 2011-08-24 DIAGNOSIS — Z951 Presence of aortocoronary bypass graft: Secondary | ICD-10-CM | POA: Insufficient documentation

## 2011-08-24 DIAGNOSIS — K219 Gastro-esophageal reflux disease without esophagitis: Secondary | ICD-10-CM | POA: Insufficient documentation

## 2011-08-24 DIAGNOSIS — T360X5A Adverse effect of penicillins, initial encounter: Secondary | ICD-10-CM | POA: Insufficient documentation

## 2011-08-24 DIAGNOSIS — E039 Hypothyroidism, unspecified: Secondary | ICD-10-CM | POA: Insufficient documentation

## 2011-08-24 DIAGNOSIS — I2510 Atherosclerotic heart disease of native coronary artery without angina pectoris: Secondary | ICD-10-CM | POA: Insufficient documentation

## 2011-08-24 DIAGNOSIS — Z87891 Personal history of nicotine dependence: Secondary | ICD-10-CM | POA: Insufficient documentation

## 2011-08-24 MED ORDER — PREDNISONE 20 MG PO TABS
60.0000 mg | ORAL_TABLET | Freq: Once | ORAL | Status: AC
  Administered 2011-08-24: 60 mg via ORAL
  Filled 2011-08-24: qty 3

## 2011-08-24 MED ORDER — PREDNISONE 10 MG PO TABS: 20.0000 mg | ORAL_TABLET | Freq: Every day | ORAL | Status: DC

## 2011-08-24 MED ORDER — DIPHENHYDRAMINE HCL 25 MG PO TABS: 25.0000 mg | ORAL_TABLET | Freq: Four times a day (QID) | ORAL | Status: DC

## 2011-08-24 MED ORDER — FAMOTIDINE 20 MG PO TABS: 20.0000 mg | ORAL_TABLET | Freq: Two times a day (BID) | ORAL | Status: DC

## 2011-08-24 MED ORDER — FAMOTIDINE 20 MG PO TABS
20.0000 mg | ORAL_TABLET | Freq: Once | ORAL | Status: AC
  Administered 2011-08-24: 20 mg via ORAL
  Filled 2011-08-24: qty 1

## 2011-08-24 NOTE — ED Notes (Signed)
Pt presents with generalized rash to arms, head, chest, and back. Pt has been taking amoxicillin.

## 2011-08-24 NOTE — ED Provider Notes (Signed)
History  This chart was scribed for Shelda Jakes, MD by [name] Scrape. This patient was seen in room APAH1/APAH1 and the patient's care was started at 8:38PM.  CSN: [id number] Arrival date & time: 08/24/2011  7:14 PM   First MD Initiated Contact with Patient 08/24/11 2008      Chief Complaint  Patient presents with  . Rash     Patient is a 65 y.o. male presenting with rash. The history is provided by the patient. No language interpreter was used.  Rash  This is a new problem. The current episode started yesterday. The problem has been gradually worsening. Associated with: Medication reaction. There has been no fever. The patient is experiencing no pain. Associated symptoms include itching. Pertinent negatives include no blisters and no pain. He has tried antihistamines for the symptoms. The treatment provided no relief.    [name] is a 65 y.o. male who presents to the Emergency Department complaining of one day of a sudden onset, gradually worsening non-raised red rash all over his body. Pt reports that he has been on amoxicillin for bronchitis for a week but stopped taking it under the advisement of his PCP yesterday due to the development of the rash. Pt denies having a pre-existing allergy to amoxicillin. Pt denies having any lip or tongue swelling. Pt reports having taken two benadryl today with no improvement. Pt reports taking prednisone daily for allergic rhinitis.  Pt's PCP is Dr. Ivan Anchors  Past Medical History  Diagnosis Date  . Coronary artery disease   . Rheumatoid arthritis   . Hyperlipidemia   . ASCVD (arteriosclerotic cardiovascular disease)   . Hypertension   . PVD (peripheral vascular disease)   . Cerebrovascular disease   . Tobacco abuse   . GERD (gastroesophageal reflux disease)   . Hypothyroid   . Allergic rhinitis   . Diabetes mellitus     Past Surgical History  Procedure Date  . Coronary artery bypass graft     x6  . Cardiac  catheterization   . Revision total knee arthroplasty     Right  . Ankle fusion     Left  . Wrist fusion     Right  . Shoulder surgery     Family History  Problem Relation Age of Onset  . Stomach cancer Mother 41  . Heart attack Father   . Heart disease Brother   . Leukemia Brother   . Lung disease Son     infant  . Lung disease Daughter     infant    History  Substance Use Topics  . Smoking status: Former Smoker -- 0.5 packs/day for 5 years    Types: Cigarettes    Quit date: 09/23/1965  . Smokeless tobacco: Not on file  . Alcohol Use: No      Review of Systems  Constitutional: Negative for fever and chills.  HENT: Negative for sore throat and rhinorrhea.   Eyes: Negative for redness and visual disturbance.  Respiratory: Positive for shortness of breath (Pt has chronic mild SOB). Negative for cough.   Cardiovascular: Negative for leg swelling.  Gastrointestinal: Negative for nausea, vomiting and diarrhea.  Genitourinary: Negative for dysuria and hematuria.  Musculoskeletal: Negative for back pain and joint swelling.  Skin: Positive for itching and rash (red areas all over body).  Neurological: Negative for light-headedness and headaches.  Hematological: Does not bruise/bleed easily.    Allergies  Amoxicillin; Doxycycline; and Levofloxacin  Home Medications   Current Outpatient  Rx  Name Route Sig Dispense Refill  . ACETAMINOPHEN 500 MG PO TABS Oral Take 1,000 mg by mouth daily.      . AMOXICILLIN 400 MG/5ML PO SUSR Oral Take 10 mLs by mouth Twice daily.    . ASPIRIN EC 81 MG PO TBEC Oral Take 81 mg by mouth daily.      . ATENOLOL 25 MG PO TABS Oral Take 25 mg by mouth daily.      . CHOLECALCIFEROL 400 UNITS PO TABS Oral Take 400 Units by mouth daily.      Marland Kitchen FOLIC ACID 1 MG PO TABS Oral Take 1 mg by mouth daily.      Marland Kitchen HYDROCODONE-ACETAMINOPHEN 5-325 MG PO TABS Oral Take 1 tablet by mouth 3 (three) times daily.     . IBUPROFEN 800 MG PO TABS Oral Take 800 mg  by mouth 2 (two) times daily.     Marland Kitchen LEFLUNOMIDE 20 MG PO TABS Oral Take 20 mg by mouth daily.      Marland Kitchen LEVOTHYROXINE SODIUM 100 MCG PO TABS Oral Take 100 mcg by mouth daily.      Marland Kitchen LISINOPRIL 20 MG PO TABS Oral Take 20 mg by mouth daily.      Marland Kitchen NIACIN 1000 MG PO TBCR Oral Take 2,000 mg by mouth daily.      Marland Kitchen FISH OIL 1200 MG PO CAPS Oral Take 1,200 mg by mouth daily.      Marland Kitchen OMEPRAZOLE MAGNESIUM 20 MG PO TBEC Oral Take 20 mg by mouth 2 (two) times daily.      Marland Kitchen PREDNISONE 10 MG PO TABS Oral Take 10 mg by mouth daily.      Marland Kitchen SIMVASTATIN 80 MG PO TABS Oral Take 80 mg by mouth at bedtime.        Triage Vitals: BP 152/78  Pulse 85  Temp(Src) 97.6 F (36.4 C) (Oral)  Resp 20  Ht 5\' 8"  (1.727 m)  Wt 165 lb (74.844 kg)  BMI 25.09 kg/m2  SpO2 98%  Physical Exam  Nursing note and vitals reviewed. Constitutional: He is oriented to person, place, and time. He appears well-developed and well-nourished.  HENT:  Head: Normocephalic and atraumatic.  Mouth/Throat: Oropharynx is clear and moist.       No tongue or lip swelling  Eyes: Conjunctivae and EOM are normal.  Neck: Neck supple.  Cardiovascular: Normal rate, regular rhythm and normal heart sounds.   Pulmonary/Chest: Effort normal and breath sounds normal.  Abdominal: Soft. Bowel sounds are normal. There is no tenderness.  Musculoskeletal: Normal range of motion. He exhibits no edema.  Lymphadenopathy:    He has no cervical adenopathy.  Neurological: He is alert and oriented to person, place, and time. No cranial nerve deficit.  Skin: Skin is warm and dry. Rash noted.       macropapular erythredemas rash everywhere     ED Course  Procedures (including critical care time)  DIAGNOSTIC STUDIES: Oxygen Saturation is 98% on room air, normal by my interpretation.    COORDINATION OF CARE: 8:40PM-Advised pt to continue on benadryl and to return to the ED if pt develops tongue or lip swelling.   Labs Reviewed - No data to display No  results found.   No diagnosis found.    MDM   Patient with drug skin reaction from amoxicillin patient has stopped the amoxicillin already taking 10 mg of prednisone a day. Tried 2 doses of Benadryl earlier. No significant allergic reaction no high is no  lip swelling no tongue swelling no wheezing. Will treat patient with prednisone 60 mg oral bolus in the emergency department continued at 40 mg a day for the next 5 days, will have patient take Benadryl for the next 3 days, MI patient take Pepcid for the next week      I personally performed the services described in this documentation, which was scribed in my presence. The recorded information has been reviewed and considered.      Shelda Jakes, MD 08/24/11 2114

## 2011-08-27 ENCOUNTER — Ambulatory Visit (HOSPITAL_COMMUNITY)
Admission: RE | Admit: 2011-08-27 | Discharge: 2011-08-27 | Disposition: A | Discharge status: Home / Self Care | Payer: Medicare Other | Source: Ambulatory Visit | Attending: Family Medicine | Admitting: Family Medicine

## 2011-08-27 DIAGNOSIS — M25519 Pain in unspecified shoulder: Secondary | ICD-10-CM | POA: Insufficient documentation

## 2011-08-27 DIAGNOSIS — I10 Essential (primary) hypertension: Secondary | ICD-10-CM | POA: Insufficient documentation

## 2011-08-27 DIAGNOSIS — M6281 Muscle weakness (generalized): Secondary | ICD-10-CM | POA: Insufficient documentation

## 2011-08-27 DIAGNOSIS — Z9889 Other specified postprocedural states: Secondary | ICD-10-CM

## 2011-08-27 DIAGNOSIS — M25619 Stiffness of unspecified shoulder, not elsewhere classified: Secondary | ICD-10-CM | POA: Insufficient documentation

## 2011-08-27 DIAGNOSIS — E119 Type 2 diabetes mellitus without complications: Secondary | ICD-10-CM | POA: Insufficient documentation

## 2011-08-27 DIAGNOSIS — IMO0001 Reserved for inherently not codable concepts without codable children: Secondary | ICD-10-CM | POA: Insufficient documentation

## 2011-08-27 NOTE — Progress Notes (Signed)
Occupational Therapy Treatment  Patient Details  Name: [name] MRN: [id number] Date of Birth: 24-Aug-1946  Today's Date: 08/27/2011 Time: 4098-1191 Time Calculation (min): 28 min Manual Therapy 4782-9562 14' Therapeutic Exercises [phone] 13' Visit#: 2  of 16   Re-eval: 09/18/11    Subjective Symptoms/Limitations Symptoms: S:  I had a rash over the weekend, it was a reaction to the medication. Pain Assessment Currently in Pain?: No/denies Pain Score: 0-No pain   Exercise/Treatments  08/27/11 0700  Shoulder Exercises: Supine  Protraction PROM;10 reps  Horizontal ABduction PROM;10 reps  External Rotation PROM;10 reps  Internal Rotation PROM;10 reps  Flexion PROM;10 reps  ABduction PROM;10 reps  Other Supine Exercises bridges x 25  Shoulder Exercises: Seated  Elevation AROM;10 reps  Extension AROM;10 reps  Retraction AROM;10 reps  Row AROM;10 reps  Shoulder Exercises: Therapy Ball  Flexion 20 reps  ABduction 20 reps  Shoulder Exercises: Isometric Strengthening  Flexion Supine;3X3"  Extension Supine;3X3"  External Rotation Supine;3X3"  Internal Rotation Supine;3X3"  ABduction Supine;3X3"  ADduction Supine;3X3"    Manual Therapy Manual Therapy: Myofascial release Myofascial Release: MFR and manual stretching to right upper arm, scapular, and shoulder region to decrease pain and restrictions and increase PROM in pain free range.  [phone]   Occupational Therapy Assessment and Plan OT Assessment and Plan Clinical Impression Statement: A:  Full PROM this date. OT Plan: P:  Add pulleys and dowel in supine as tolerated.   Goals Short Term Goals Time to Complete Short Term Goals: 4 weeks Short Term Goal 1: Patient will be I with HEP. Short Term Goal 1 Progress: Progressing toward goal Short Term Goal 2: Patient will increase AROM to 50% in right shoulder for increased ability to reach into overhead cabinets. Short Term Goal 2 Progress: Progressing toward  goal Short Term Goal 3: Patient will increase right shoulder strength to 3+/5 for increased independence with yardwork. Short Term Goal 3 Progress: Progressing toward goal Short Term Goal 4: Patient will decrease fascial restrictions from min-mod to min. Short Term Goal 4 Progress: Progressing toward goal Short Term Goal 5: Patient will decrease pain in right shoulder to 2/10. Long Term Goals Time to Complete Long Term Goals: 8 weeks Long Term Goal 1: Patient will return to prior level of I with all B/IADLs, work, and leisure activiites. Long Term Goal 1 Progress: Progressing toward goal Long Term Goal 2: Patient will increase right shoulder AROM to [facility] for increased ability to reach into overhead cabinets. Long Term Goal 2 Progress: Progressing toward goal Long Term Goal 3: Patient will increase right shoulder strength to 4+/5 for increased ability to complete yard work. Long Term Goal 3 Progress: Progressing toward goal Long Term Goal 4: Patient will decrease pain in right shoulder to 1/10 in his right shoulder. Long Term Goal 4 Progress: Progressing toward goal Long Term Goal 5: Patient will decrease fascial restrictions to trace in his right shoulder. Long Term Goal 5 Progress: Progressing toward goal End of Session Patient Active Problem List  Diagnoses  . HYPOTHYROIDISM  . HYPERLIPIDEMIA  . ATHEROSCLEROTIC CARDIOVASCULAR DISEASE  . PERIPHERAL VASCULAR DISEASE  . ALLERGIC RHINITIS  . GASTROESOPHAGEAL REFLUX DISEASE  . RHEUMATOID ARTHRITIS  . Coronary artery disease  . Carotid artery disease  . Preoperative examination, unspecified  . Hypertension  . Dysphagia  . Chronic diarrhea  . S/P arthroscopy of shoulder  . Pain in joint, shoulder region  . Muscle weakness (generalized)   End of Session Activity Tolerance: Patient tolerated  treatment well General Behavior During Session: [facility] for tasks performed Cognition: [facility] for tasks performed   Shirlean Mylar,  OTR/L  08/27/2011, 4:46 PM

## 2011-08-29 ENCOUNTER — Ambulatory Visit (HOSPITAL_COMMUNITY)
Admission: RE | Admit: 2011-08-29 | Discharge: 2011-08-29 | Disposition: A | Discharge status: Home / Self Care | Payer: Medicare Other | Source: Ambulatory Visit | Attending: Orthopaedic Surgery | Admitting: Orthopaedic Surgery

## 2011-08-29 DIAGNOSIS — M6281 Muscle weakness (generalized): Secondary | ICD-10-CM

## 2011-08-29 DIAGNOSIS — M25519 Pain in unspecified shoulder: Secondary | ICD-10-CM

## 2011-08-29 DIAGNOSIS — Z9889 Other specified postprocedural states: Secondary | ICD-10-CM

## 2011-08-29 NOTE — Progress Notes (Signed)
Occupational Therapy Treatment  Patient Details  Name: TEMILOLUWA LAREDO MRN: [id number] Date of Birth: 1946-09-16  Today's Date: 08/29/2011 Time: 7829-5621 Time Calculation (min): 47 min Manual Therapy [phone] 10' Therapeutic Exercises [id number] no charge secondary to supervised by rehab tech Therapeutic Exercises [phone] 11' Visit#: 3  of 16   Re-eval: 09/18/11    Subjective Symptoms/Limitations Symptoms: S:  ITs been feeling ok. Pain Assessment Currently in Pain?: No/denies  O:  Exercise/Treatments  08/29/11 0700 Shoulder Exercises: Supine Protraction PROM;10 reps;AAROM;5 reps Horizontal ABduction PROM;10 reps;AAROM;5 reps External Rotation PROM;10 reps;AAROM;5 reps Internal Rotation PROM;10 reps;AAROM;5 reps Flexion PROM;10 reps;AAROM;5 reps ABduction PROM;10 reps;AAROM;5 reps Other Supine Exercises bridges x 25 Shoulder Exercises: Seated Elevation AROM;10 reps Extension AROM;10 reps Retraction AROM;10 reps Row AROM;10 reps Shoulder Exercises: Therapy Ball Flexion 20 reps ABduction 20 reps Shoulder Exercises: Isometric Strengthening Flexion Supine;5X5" Extension Supine;5X5" External Rotation Supine;5X5" Internal Rotation Supine;5X5" ABduction Supine;5X5" ADduction Supine;5X5"  Manual Therapy Manual Therapy: Myofascial release Myofascial Release: MFR and manual stretching to right upper arm, scapular, and shoulder region to decrease pain and restrictions and increase PROM in pain free range.  [phone]  Occupational Therapy Assessment and Plan OT Assessment and Plan Clinical Impression Statement: A:  Added dowel and pulleys today.  Had some difficulty with dowel. OT Plan: P:  Increase to 10 reps with dowel and issue as HEP as tolerated.   Goals Short Term Goals Time to Complete Short Term Goals: 4 weeks Short Term Goal 1: Patient will be I with HEP. Short Term Goal 2: Patient will increase AROM to 50% in right shoulder for increased ability to reach into  overhead cabinets. Short Term Goal 3: Patient will increase right shoulder strength to 3+/5 for increased independence with yardwork. Short Term Goal 4: Patient will decrease fascial restrictions from min-mod to min. Short Term Goal 5: Patient will decrease pain in right shoulder to 2/10. Long Term Goals Time to Complete Long Term Goals: 8 weeks Long Term Goal 1: Patient will return to prior level of I with all B/IADLs, work, and leisure activiites. Long Term Goal 2: Patient will increase right shoulder AROM to [facility] for increased ability to reach into overhead cabinets. Long Term Goal 3: Patient will increase right shoulder strength to 4+/5 for increased ability to complete yard work. Long Term Goal 4: Patient will decrease pain in right shoulder to 1/10 in his right shoulder. Long Term Goal 5: Patient will decrease fascial restrictions to trace in his right shoulder. End of Session Patient Active Problem List  Diagnoses  . HYPOTHYROIDISM  . HYPERLIPIDEMIA  . ATHEROSCLEROTIC CARDIOVASCULAR DISEASE  . PERIPHERAL VASCULAR DISEASE  . ALLERGIC RHINITIS  . GASTROESOPHAGEAL REFLUX DISEASE  . RHEUMATOID ARTHRITIS  . Coronary artery disease  . Carotid artery disease  . Preoperative examination, unspecified  . Hypertension  . Dysphagia  . Chronic diarrhea  . S/P arthroscopy of shoulder  . Pain in joint, shoulder region  . Muscle weakness (generalized)   End of Session Activity Tolerance: Patient tolerated treatment well General Behavior During Session: [facility] for tasks performed Cognition: [facility] for tasks performed   Shirlean Mylar, OTR/L  08/29/2011, 4:55 PM

## 2011-09-02 ENCOUNTER — Encounter (HOSPITAL_COMMUNITY): Payer: Self-pay | Admitting: Pharmacy Technician

## 2011-09-03 ENCOUNTER — Ambulatory Visit (HOSPITAL_COMMUNITY)
Admission: RE | Admit: 2011-09-03 | Discharge: 2011-09-03 | Disposition: A | Discharge status: Home / Self Care | Payer: Medicare Other | Source: Ambulatory Visit | Attending: Orthopaedic Surgery | Admitting: Orthopaedic Surgery

## 2011-09-03 DIAGNOSIS — M6281 Muscle weakness (generalized): Secondary | ICD-10-CM

## 2011-09-03 DIAGNOSIS — Z9889 Other specified postprocedural states: Secondary | ICD-10-CM

## 2011-09-03 DIAGNOSIS — M25519 Pain in unspecified shoulder: Secondary | ICD-10-CM

## 2011-09-03 NOTE — Progress Notes (Signed)
Occupational Therapy Treatment  Patient Details  Name: [name] MRN: [id number] Date of Birth: November 01, 1945  Today's Date: 09/03/2011 Time: 2536-6440 Time Calculation (min): 37 min Visit#: 4  of 16   Re-eval: 09/18/11 Manual Therapy  [phone] 19' Therapeutic Exercise  [phone] 17'    Subjective Symptoms/Limitations Symptoms: S:  I am doing very good. Pain Assessment Pain Score:   2 Pain Location: Shoulder Pain Orientation: Right Pain Type: Acute pain   Exercise/Treatments Supine Protraction: PROM;AAROM;10 reps Horizontal ABduction: PROM;AAROM;10 reps External Rotation: PROM;AAROM;10 reps Internal Rotation: PROM;AAROM;10 reps Flexion: PROM;AAROM;10 reps ABduction: PROM;AAROM;10 reps Other Supine Exercises: bridges x 25 Seated Elevation: AROM;10 reps Extension: AROM;10 reps Retraction: AROM;10 reps Row: AROM;10 reps Therapy Ball Flexion: 20 reps ABduction: 20 reps  Manual Therapy Manual Therapy: Myofascial release Myofascial Release: MFR and manual stretching to right upper arm, scapular, and shoulder region to decrease pain and restrictions and increase PROM in pain free range  Occupational Therapy Assessment and Plan OT Assessment and Plan Clinical Impression Statement: A:  Increased ease with dowel. Rehab Potential: Excellent OT Plan: P:  Issue dowel for HEP   Goals Short Term Goals Time to Complete Short Term Goals: 4 weeks Short Term Goal 1: Patient will be I with HEP. Short Term Goal 2: Patient will increase AROM to 50% in right shoulder for increased ability to reach into overhead cabinets. Short Term Goal 3: Patient will increase right shoulder strength to 3+/5 for increased independence with yardwork. Short Term Goal 4: Patient will decrease fascial restrictions from min-mod to min. Short Term Goal 5: Patient will decrease pain in right shoulder to 2/10. Long Term Goals Time to Complete Long Term Goals: 8 weeks Long Term Goal 1: Patient will return  to prior level of I with all B/IADLs, work, and leisure activiites. Long Term Goal 2: Patient will increase right shoulder AROM to [facility] for increased ability to reach into overhead cabinets. Long Term Goal 3: Patient will increase right shoulder strength to 4+/5 for increased ability to complete yard work. Long Term Goal 4: Patient will decrease pain in right shoulder to 1/10 in his right shoulder. Long Term Goal 5: Patient will decrease fascial restrictions to trace in his right shoulder. End of Session Patient Active Problem List  Diagnoses  . HYPOTHYROIDISM  . HYPERLIPIDEMIA  . ATHEROSCLEROTIC CARDIOVASCULAR DISEASE  . PERIPHERAL VASCULAR DISEASE  . ALLERGIC RHINITIS  . GASTROESOPHAGEAL REFLUX DISEASE  . RHEUMATOID ARTHRITIS  . Coronary artery disease  . Carotid artery disease  . Preoperative examination, unspecified  . Hypertension  . Dysphagia  . Chronic diarrhea  . S/P arthroscopy of shoulder  . Pain in joint, shoulder region  . Muscle weakness (generalized)   End of Session Activity Tolerance: Patient tolerated treatment well General Behavior During Session: [facility] for tasks performed Cognition: [facility] for tasks performed   Attikus Bartoszek L. Dvontae Ruan, COTA/L  09/03/2011, 2:35 PM

## 2011-09-04 NOTE — Progress Notes (Signed)
No recent labs from Dr. Fletcher Anon office. LFTs normal 12/12/2010.

## 2011-09-05 ENCOUNTER — Ambulatory Visit (HOSPITAL_COMMUNITY)
Admission: RE | Admit: 2011-09-05 | Discharge: 2011-09-05 | Disposition: A | Discharge status: Home / Self Care | Payer: Medicare Other | Source: Ambulatory Visit | Attending: Orthopaedic Surgery | Admitting: Orthopaedic Surgery

## 2011-09-05 DIAGNOSIS — Z9889 Other specified postprocedural states: Secondary | ICD-10-CM

## 2011-09-05 DIAGNOSIS — M25519 Pain in unspecified shoulder: Secondary | ICD-10-CM

## 2011-09-05 DIAGNOSIS — M6281 Muscle weakness (generalized): Secondary | ICD-10-CM

## 2011-09-05 NOTE — Progress Notes (Signed)
Occupational Therapy Treatment  Patient Details  Name: [name] MRN: [id number] Date of Birth: 1946-06-12  Today's Date: 09/05/2011 Time: 4098-1191 Time Calculation (min): 38 min Manual Therapy 1521-1540 19' Therapeutic Exercises [phone] 75'  Visit#: 5  of 16   Re-eval: 09/18/11    Subjective Symptoms/Limitations Symptoms: S:  Its hard for me to do some exercises because of my wrist. Pain Assessment Currently in Pain?: No/denies Pain Score: 0-No pain   Exercise/Treatments Supine Protraction: PROM;10 reps;AAROM;15 reps Horizontal ABduction: PROM;10 reps;AAROM;15 reps External Rotation: PROM;10 reps;AAROM;15 reps Internal Rotation: PROM;10 reps;AAROM;15 reps Flexion: PROM;10 reps;AAROM;15 reps ABduction: PROM;10 reps;AAROM;15 reps Seated Elevation: AROM;15 reps Extension: AROM;15 reps Retraction: AROM;15 reps Row: AROM;15 reps Pulleys Flexion: 2 minutes ABduction: 2 minutes Therapy Ball Flexion: 20 reps ABduction: 20 reps ROM / Strengthening / Isometric Strengthening Wall Wash: 1' Thumb Tacks: begin next visit Prot/Ret//Elev/Dep: begin next visit   Manual Therapy Manual Therapy: Myofascial release Myofascial Release: MFR and manual stretching to right upper arm, scapular, and shoulder region to decrease pain and restrictions and increase PROM in pain free range.  [id number]  Occupational Therapy Assessment and Plan OT Assessment and Plan Clinical Impression Statement: A:  Increased reps with dowel. OT Plan: P:  give dowel as HEP, add AROM in supine.   Goals Short Term Goals Time to Complete Short Term Goals: 4 weeks Short Term Goal 1: Patient will be I with HEP. Short Term Goal 2: Patient will increase AROM to 50% in right shoulder for increased ability to reach into overhead cabinets. Short Term Goal 3: Patient will increase right shoulder strength to 3+/5 for increased independence with yardwork. Short Term Goal 4: Patient will decrease fascial  restrictions from min-mod to min. Short Term Goal 5: Patient will decrease pain in right shoulder to 2/10. Long Term Goals Time to Complete Long Term Goals: 8 weeks Long Term Goal 1: Patient will return to prior level of I with all B/IADLs, work, and leisure activiites. Long Term Goal 2: Patient will increase right shoulder AROM to [facility] for increased ability to reach into overhead cabinets. Long Term Goal 3: Patient will increase right shoulder strength to 4+/5 for increased ability to complete yard work. Long Term Goal 4: Patient will decrease pain in right shoulder to 1/10 in his right shoulder. Long Term Goal 5: Patient will decrease fascial restrictions to trace in his right shoulder. End of Session Patient Active Problem List  Diagnoses  . HYPOTHYROIDISM  . HYPERLIPIDEMIA  . ATHEROSCLEROTIC CARDIOVASCULAR DISEASE  . PERIPHERAL VASCULAR DISEASE  . ALLERGIC RHINITIS  . GASTROESOPHAGEAL REFLUX DISEASE  . RHEUMATOID ARTHRITIS  . Coronary artery disease  . Carotid artery disease  . Preoperative examination, unspecified  . Hypertension  . Dysphagia  . Chronic diarrhea  . S/P arthroscopy of shoulder  . Pain in joint, shoulder region  . Muscle weakness (generalized)   End of Session Activity Tolerance: Patient tolerated treatment well General Behavior During Session: [facility] for tasks performed Cognition: [facility] for tasks performed   Shirlean Mylar, OTR/L  09/05/2011, 4:02 PM

## 2011-09-09 ENCOUNTER — Ambulatory Visit (HOSPITAL_COMMUNITY): Payer: Medicare Other | Admitting: Occupational Therapy

## 2011-09-10 ENCOUNTER — Ambulatory Visit (HOSPITAL_COMMUNITY): Payer: Medicare Other | Admitting: Occupational Therapy

## 2011-09-11 MED ORDER — SODIUM CHLORIDE 0.45 % IV SOLN
Freq: Once | INTRAVENOUS | Status: AC
  Administered 2011-09-12: 11:00:00 via INTRAVENOUS

## 2011-09-12 ENCOUNTER — Other Ambulatory Visit: Payer: Self-pay | Admitting: Internal Medicine

## 2011-09-12 ENCOUNTER — Encounter (HOSPITAL_COMMUNITY): Payer: Self-pay | Admitting: *Deleted

## 2011-09-12 ENCOUNTER — Encounter (HOSPITAL_COMMUNITY)
Admission: RE | Disposition: A | Discharge status: Home / Self Care | Payer: Self-pay | Source: Ambulatory Visit | Attending: Internal Medicine

## 2011-09-12 ENCOUNTER — Ambulatory Visit (HOSPITAL_COMMUNITY)
Admission: RE | Admit: 2011-09-12 | Discharge: 2011-09-12 | Disposition: A | Discharge status: Home / Self Care | Payer: Medicare Other | Source: Ambulatory Visit | Attending: Internal Medicine | Admitting: Internal Medicine

## 2011-09-12 DIAGNOSIS — Z01812 Encounter for preprocedural laboratory examination: Secondary | ICD-10-CM | POA: Insufficient documentation

## 2011-09-12 DIAGNOSIS — E785 Hyperlipidemia, unspecified: Secondary | ICD-10-CM | POA: Insufficient documentation

## 2011-09-12 DIAGNOSIS — K222 Esophageal obstruction: Secondary | ICD-10-CM | POA: Insufficient documentation

## 2011-09-12 DIAGNOSIS — Z79899 Other long term (current) drug therapy: Secondary | ICD-10-CM | POA: Insufficient documentation

## 2011-09-12 DIAGNOSIS — K219 Gastro-esophageal reflux disease without esophagitis: Secondary | ICD-10-CM

## 2011-09-12 DIAGNOSIS — K2960 Other gastritis without bleeding: Secondary | ICD-10-CM

## 2011-09-12 DIAGNOSIS — K648 Other hemorrhoids: Secondary | ICD-10-CM | POA: Insufficient documentation

## 2011-09-12 DIAGNOSIS — Z951 Presence of aortocoronary bypass graft: Secondary | ICD-10-CM | POA: Insufficient documentation

## 2011-09-12 DIAGNOSIS — Z7982 Long term (current) use of aspirin: Secondary | ICD-10-CM | POA: Insufficient documentation

## 2011-09-12 DIAGNOSIS — R1310 Dysphagia, unspecified: Secondary | ICD-10-CM

## 2011-09-12 DIAGNOSIS — E119 Type 2 diabetes mellitus without complications: Secondary | ICD-10-CM | POA: Insufficient documentation

## 2011-09-12 DIAGNOSIS — K449 Diaphragmatic hernia without obstruction or gangrene: Secondary | ICD-10-CM

## 2011-09-12 DIAGNOSIS — I10 Essential (primary) hypertension: Secondary | ICD-10-CM | POA: Insufficient documentation

## 2011-09-12 DIAGNOSIS — R197 Diarrhea, unspecified: Secondary | ICD-10-CM

## 2011-09-12 DIAGNOSIS — K529 Noninfective gastroenteritis and colitis, unspecified: Secondary | ICD-10-CM

## 2011-09-12 HISTORY — PX: ESOPHAGOGASTRODUODENOSCOPY: SHX1529

## 2011-09-12 HISTORY — PX: COLONOSCOPY: SHX5424

## 2011-09-12 LAB — CLOSTRIDIUM DIFFICILE BY PCR: Toxigenic C. Difficile by PCR: POSITIVE — AB

## 2011-09-12 SURGERY — COLONOSCOPY
Anesthesia: Moderate Sedation

## 2011-09-12 MED ORDER — MIDAZOLAM HCL 5 MG/5ML IJ SOLN
INTRAMUSCULAR | Status: DC | PRN
  Administered 2011-09-12: 1 mg via INTRAVENOUS
  Administered 2011-09-12: 2 mg via INTRAVENOUS
  Administered 2011-09-12: 1 mg via INTRAVENOUS

## 2011-09-12 MED ORDER — MIDAZOLAM HCL 5 MG/5ML IJ SOLN
INTRAMUSCULAR | Status: AC
  Filled 2011-09-12: qty 10

## 2011-09-12 MED ORDER — STERILE WATER FOR IRRIGATION IR SOLN
Status: DC | PRN
  Administered 2011-09-12: 12:00:00

## 2011-09-12 MED ORDER — MEPERIDINE HCL 100 MG/ML IJ SOLN
INTRAMUSCULAR | Status: DC | PRN
  Administered 2011-09-12: 50 mg via INTRAVENOUS
  Administered 2011-09-12 (×2): 25 mg via INTRAVENOUS

## 2011-09-12 MED ORDER — MEPERIDINE HCL 100 MG/ML IJ SOLN
INTRAMUSCULAR | Status: AC
  Filled 2011-09-12: qty 2

## 2011-09-12 MED ORDER — BUTAMBEN-TETRACAINE-BENZOCAINE 2-2-14 % EX AERO
INHALATION_SPRAY | CUTANEOUS | Status: DC | PRN
  Administered 2011-09-12: 2 via TOPICAL

## 2011-09-12 NOTE — H&P (Signed)
  I have seen & examined the patient prior to the procedure(s) today and reviewed the history and physical/consultation.  There have been no changes.  After consideration of the risks, benefits, alternatives and imponderables, the patient has consented to the procedure(s).   

## 2011-09-12 NOTE — Progress Notes (Unsigned)
Called pt per RMR instructions. Pt aware of +cdiff results . rx called to [facility]. Left samples of align #4 boxes at front desk.

## 2011-09-12 NOTE — Op Note (Signed)
[facility] [address] [place], [place]  [address]  ENDOSCOPY PROCEDURE REPORT  PATIENT:  Fernando, [name]  MR#:  [id number] BIRTHDATE:  1945/10/06, 65 yrs. old  GENDER:  male  ENDOSCOPIST:  R. Roetta Sessions, MD [facility] Referred by:          Dr. Minette Headland  PROCEDURE DATE:  09/12/2011 PROCEDURE:  EGD with Elease Hashimoto dilation  INDICATIONS:  esophageal dysphagia  INFORMED CONSENT:   The risks, benefits, limitations, alternatives and imponderables have been discussed.  The potential for biopsy, esophogeal dilation, etc. have also been reviewed.  Questions have been answered.  All parties agreeable.  Please see the history and physical in the medical record for more information.  MEDICATIONS:      Versed 3 mg IV and Demerol 75 mg IV in divided doses. Cetacaine spray.  DESCRIPTION OF PROCEDURE:   The EG-[id number] ([id number]) endoscope was introduced through the mouth and advanced to the second portion of the duodenum without difficulty or limitations.  The mucosal surfaces were surveyed very carefully during advancement of the scope and upon withdrawal.  Retroflexion view of the proximal stomach and esophagogastric junction was performed.  <<PROCEDUREIMAGES>>  FINDINGS:   Schatzki's ring; otherwise normal-appearing esophagus. Small hiatal hernia. Couple of antral erosions; otherwise normal stomach patent pylorus; normal first and second portion of the duodenum  THERAPEUTIC / DIAGNOSTIC MANEUVERS PERFORMED:   29 French Maloney dilator passed to full insertion easily. A look back revealed the ring had been ruptured nicely without complication.  COMPLICATIONS:   None  IMPRESSION:    Schatzki's ring-status post dilation as described above. Small hiatal hernia. Couple tiny antral and clinical significance  RECOMMENDATIONS:      GERD information.  PPI. See colonoscopy report  ______________________________ R. Roetta Sessions, MD Caleen Essex  CC:  Simone Curia,  Md  n. eSIGNED:   R. Roetta Sessions at 09/12/2011 12:26 PM  Arnold Long, [phone]

## 2011-09-12 NOTE — Op Note (Signed)
[facility] [address] [place], [place]  [address]  COLONOSCOPY PROCEDURE REPORT  PATIENT:  [name], [name]  MR#:  [id number] BIRTHDATE:  [birth year], 65 yrs. old  GENDER:  male ENDOSCOPIST:  R. Roetta Sessions, MD FACP [facility] REF. BY:          Dr. Minette Headland PROCEDURE DATE:  09/12/2011 PROCEDURE:  ileocolonoscopy  with biopsy and stool sample  INDICATIONS:  Chronic diarrhea/colorectal cancer screening  INFORMED CONSENT:  The risks, benefits, alternatives and imponderables including but not limited to bleeding, perforation as well as the possibility of a missed lesion have been reviewed. The potential for biopsy, lesion removal, etc. have also been discussed.  Questions have been answered.  All parties agreeable. Please see the history and physical in the medical record for more information.  MEDICATIONS:  Versed 4 mg IV and Demerol 100 mg IV in divided doses.  DESCRIPTION OF PROCEDURE:  After a digital rectal exam was performed, the EC-3890Li ([id number]) colonoscope was advanced from the anus through the rectum and colon to the area of the cecum, ileocecal valve and appendiceal orifice.  The cecum was deeply intubated.  These structures were well-seen and photographed for the record.  From the level of the cecum and ileocecal valve, the scope was slowly and cautiously withdrawn.  The mucosal surfaces were carefully surveyed utilizing scope tip deflection to facilitate fold flattening as needed.  The scope was pulled down into the rectum where a thorough examination including retroflexion was performed. <<PROCEDUREIMAGES>>  FINDINGS:adequate preparation. Normal-appearing colonic and rectal mucosa aside from internal hemorrhoids. Normal distal 10 cm of terminal         ileum  THERAPEUTIC / DIAGNOSTIC MANEUVERS PERFORMED:  stool sample obtained. Single biopsies of the asending and descending segments taken to rule out microscopic colitis  COMPLICATIONS:  none  CECAL  WITHDRAWAL TIME:  8 minutes  IMPRESSION:  Internal hemorrhoids; otherwise normal rectum, colon and terminal ileum. Status post biopsy and stool sampling  RECOMMENDATIONS:   See EGD report  ______________________________ R. Roetta Sessions, MD Caleen Essex  CC:  Simone Curia, Md  n. eSIGNED:   R. Roetta Sessions at 09/12/2011 12:44 PM  Arnold Long, [phone]

## 2011-09-12 NOTE — Progress Notes (Signed)
Patient's Clostridium difficile PCR assay came back positive. We need to call in a prescription for Flagyl 250 mg orally 4 times a day x10 days. Also needs a probiotic [employer]) daily x3 months. He needs an office visit with [place] in 3 months as well. Please let him know and call the prescription in

## 2011-09-13 ENCOUNTER — Encounter (HOSPITAL_COMMUNITY)
Admission: RE | Disposition: A | Discharge status: Home / Self Care | Payer: Self-pay | Source: Ambulatory Visit | Attending: Gastroenterology

## 2011-09-13 ENCOUNTER — Other Ambulatory Visit: Payer: Self-pay | Admitting: Gastroenterology

## 2011-09-13 ENCOUNTER — Encounter (HOSPITAL_COMMUNITY): Payer: Self-pay | Admitting: *Deleted

## 2011-09-13 ENCOUNTER — Ambulatory Visit (HOSPITAL_COMMUNITY)
Admission: RE | Admit: 2011-09-13 | Discharge: 2011-09-13 | Disposition: A | Discharge status: Home / Self Care | Payer: Medicare Other | Source: Ambulatory Visit | Attending: Gastroenterology | Admitting: Gastroenterology

## 2011-09-13 ENCOUNTER — Ambulatory Visit (HOSPITAL_COMMUNITY)
Admission: RE | Admit: 2011-09-13 | Discharge: 2011-09-13 | Disposition: A | Discharge status: Home / Self Care | Payer: Medicare Other | Source: Ambulatory Visit | Attending: Internal Medicine | Admitting: Internal Medicine

## 2011-09-13 ENCOUNTER — Telehealth: Payer: Self-pay | Admitting: Internal Medicine

## 2011-09-13 DIAGNOSIS — K222 Esophageal obstruction: Secondary | ICD-10-CM

## 2011-09-13 DIAGNOSIS — K2210 Ulcer of esophagus without bleeding: Secondary | ICD-10-CM

## 2011-09-13 DIAGNOSIS — K2970 Gastritis, unspecified, without bleeding: Secondary | ICD-10-CM | POA: Insufficient documentation

## 2011-09-13 DIAGNOSIS — E785 Hyperlipidemia, unspecified: Secondary | ICD-10-CM | POA: Insufficient documentation

## 2011-09-13 DIAGNOSIS — Z7982 Long term (current) use of aspirin: Secondary | ICD-10-CM | POA: Insufficient documentation

## 2011-09-13 DIAGNOSIS — T18108A Unspecified foreign body in esophagus causing other injury, initial encounter: Secondary | ICD-10-CM

## 2011-09-13 DIAGNOSIS — K5710 Diverticulosis of small intestine without perforation or abscess without bleeding: Secondary | ICD-10-CM | POA: Insufficient documentation

## 2011-09-13 DIAGNOSIS — I10 Essential (primary) hypertension: Secondary | ICD-10-CM | POA: Insufficient documentation

## 2011-09-13 DIAGNOSIS — E119 Type 2 diabetes mellitus without complications: Secondary | ICD-10-CM | POA: Insufficient documentation

## 2011-09-13 DIAGNOSIS — Z79899 Other long term (current) drug therapy: Secondary | ICD-10-CM | POA: Insufficient documentation

## 2011-09-13 DIAGNOSIS — R1310 Dysphagia, unspecified: Secondary | ICD-10-CM

## 2011-09-13 HISTORY — PX: SAVORY DILATION: SHX5439

## 2011-09-13 HISTORY — PX: ESOPHAGOGASTRODUODENOSCOPY: SHX5428

## 2011-09-13 LAB — KOH PREP: KOH Prep: NONE SEEN

## 2011-09-13 LAB — FECAL LACTOFERRIN, QUANT: Fecal Lactoferrin: POSITIVE

## 2011-09-13 SURGERY — EGD (ESOPHAGOGASTRODUODENOSCOPY)
Anesthesia: Moderate Sedation | Site: Esophagus

## 2011-09-13 MED ORDER — BUTAMBEN-TETRACAINE-BENZOCAINE 2-2-14 % EX AERO
INHALATION_SPRAY | CUTANEOUS | Status: DC | PRN
  Administered 2011-09-13: 2 via TOPICAL

## 2011-09-13 MED ORDER — SODIUM CHLORIDE 0.45 % IV SOLN
Freq: Once | INTRAVENOUS | Status: AC
  Administered 2011-09-13: 1000 mL via INTRAVENOUS

## 2011-09-13 MED ORDER — MINERAL OIL PO OIL
TOPICAL_OIL | ORAL | Status: AC
  Filled 2011-09-13: qty 30

## 2011-09-13 MED ORDER — MEPERIDINE HCL 100 MG/ML IJ SOLN
INTRAMUSCULAR | Status: DC | PRN
  Administered 2011-09-13 (×2): 25 mg via INTRAVENOUS

## 2011-09-13 MED ORDER — MIDAZOLAM HCL 5 MG/5ML IJ SOLN
INTRAMUSCULAR | Status: DC | PRN
  Administered 2011-09-13: 2 mg via INTRAVENOUS
  Administered 2011-09-13: 1 mg via INTRAVENOUS

## 2011-09-13 MED ORDER — MEPERIDINE HCL 100 MG/ML IJ SOLN
INTRAMUSCULAR | Status: AC
  Filled 2011-09-13: qty 1

## 2011-09-13 MED ORDER — MIDAZOLAM HCL 5 MG/5ML IJ SOLN
INTRAMUSCULAR | Status: AC
  Filled 2011-09-13: qty 10

## 2011-09-13 NOTE — Op Note (Addendum)
[facility] [address] [place], [place]  [address]  ENDOSCOPY PROCEDURE REPORT  PATIENT:  [name], [name]  MR#:  [id number] BIRTHDATE:  10-Nov-1945, 65 yrs. old  GENDER:  male  ENDOSCOPIST:  Jonette Eva, MD ASSISTANT: Referred by:  Lilyan Punt, M.D. Leda Roys GIJena Gauss, MD  PROCEDURE DATE:  09/13/2011 PROCEDURE:  EGD with dilatation over guidewire, EGD with BRUSH biopsy OF THE ESOPHAGUS ASA CLASS: INDICATIONS:  Dysphagia-BaSw 12/21 revealed obstruction of distal esophagus. PT STATES SHE'S HAD PROBLEMS SWALLOWING OR 1-2 YEARS BUT IT GOT WORSE LAST NIGHT AROUND 7 OR 8 pm. HE AD POTATO SOUP LAST NIGHT & WAS UNABLE TO KEEP DOWN WATER, PILLS, OR FOOD THIS AM  S/P Egd/dil 56 Fr Maloney 12/20  MEDICATIONS:   Demerol 50 mg IV, Versed 3 mg IV TOPICAL ANESTHETIC:  Cetacaine Spray  DESCRIPTION OF PROCEDURE:   After the risks benefits and alternatives of the procedure were thoroughly explained, informed consent was obtained.  The EG-[id number] ([id number]) and EC-3890Li ([id number]) endoscope was introduced through the mouth and advanced to the second portion of the duodenum.  The instrument was slowly withdrawn as the mucosa was carefully examined.  Prior to withdrawal of the scope, the guidwire was placed.  The esophagus was dilated successfully.  The patient was recovered in endoscopy and discharged home in satisfactory condition. <<PROCEDUREIMAGES>>  WHITE Plaques were found in the mid esophagus & BRUSH BIOPSIES OBTAINED.  An esophageal ring was found in the distal esophagus. An ulcer was found in the distal esophagus.  Mild gastritis was found.  A Sdiverticulum was found in the second portion of the duodenum.    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 REVISED:  09/13/2011 12:43 PM eSIGNED:   Sandi Fields at 09/13/2011 12:43 PM  Arnold Long, [phone]

## 2011-09-13 NOTE — Telephone Encounter (Signed)
Spoke to pt. He said he did start Flagyl last night. Per Dr. Jena Gauss, informed pt he is being scheduled for a BPE and Crystal will call him shortly with time and instructions.

## 2011-09-13 NOTE — Telephone Encounter (Signed)
Pt scheduled for BPE at 10:00- he is aware

## 2011-09-13 NOTE — Telephone Encounter (Signed)
Called and spoke with patient. He said he cannot swallow, no better than before his procedures yesterday. Said he had a little potato soup last night. This morning tried to take his pills with a cookie and it would not go down. Felt like it is something blocking it from going down. Feels like he needs to burp and can't. Please advise!

## 2011-09-13 NOTE — H&P (View-Only) (Signed)
Referring Provider: Dr. Scott Luking Primary Care Physician:  LUKING,W S, MD, MD Primary Gastroenterologist:  Dr. Rourk  Chief Complaint  Patient presents with  . Dysphagia  . Diarrhea   HPI:  [name] is a 65 y.o. male here as a referral from Dr. Luking for dysphagia and diarrhea.  He gives a history of chronic GERD.  He states "I can't swalow food" for the past 6 mo. "It just won't go down."  He points to his mid esophagus. He is having problems w/ both liquids & solids getting stuck in upper-mid esophagus.  He c/o daily heartburn & indigestion.  He has been taking taking prilosec 20mg BID for 3 yrs, but he had been on nexium but couldn't afford it.  He felt like the Nexium worked better. His symptoms are worse nocturnally.  He has also been having diarrhea & incontinence of stools x 6 months.  He believes his last colonoscopy was over 10 years ago. He has been having watery stools 3-4 per day.  No new meds except leflunomide for 6 months for his rheumatoid arthritis.  No foreign travel.  +City water.  Wife has diarrhea but blames this on IBS.  Denies rectal bleeding or melena.  Unintentional Wt loss approximately 6# in past 6 mo.  His appetite has been decreased.  He has been taking IBU 800mg BID for his RA.  Past Medical History  Diagnosis Date  . Coronary artery disease   . Rheumatoid arthritis   . Hyperlipidemia   . ASCVD (arteriosclerotic cardiovascular disease)   . Hypertension   . PVD (peripheral vascular disease)   . Cerebrovascular disease   . Tobacco abuse   . GERD (gastroesophageal reflux disease)   . Hypothyroid   . Allergic rhinitis   . Diabetes mellitus    Past Surgical History  Procedure Date  . Coronary artery bypass graft     x6  . Cardiac catheterization   . Revision total knee arthroplasty     Right  . Ankle fusion     Left  . Wrist fusion     Right  . Shoulder surgery    Current Outpatient Prescriptions  Medication Sig Dispense Refill  .  acetaminophen (TYLENOL) 500 MG tablet Take 1,000 mg by mouth daily.        . amoxicillin (AMOXIL) 400 MG/5ML suspension Take 10 mLs by mouth Twice daily.      . aspirin 81 MG tablet Take 81 mg by mouth daily.        . atenolol (TENORMIN) 25 MG tablet Take 25 mg by mouth daily.        . cholecalciferol (VITAMIN D) 400 UNITS TABS Take 400 Units by mouth daily.        . folic acid (FOLVITE) 1 MG tablet Take 1 mg by mouth daily.        . HYDROcodone-acetaminophen (NORCO) 5-325 MG per tablet Take 1 tablet by mouth every 4 (four) hours as needed.       . ibuprofen (ADVIL,MOTRIN) 800 MG tablet Take 800 mg by mouth every 8 (eight) hours as needed.        . leflunomide (ARAVA) 20 MG tablet Take 20 mg by mouth daily.        . levothyroxine (SYNTHROID) 112 MCG tablet Take 112 mcg by mouth daily.        . lisinopril (PRINIVIL,ZESTRIL) 40 MG tablet Take 1 tablet (40 mg total) by mouth daily. Increase in dose  90 tablet    1  . Niacin 1000 MG TBCR Take 2,000 mg by mouth daily.        . Omega-3 Fatty Acids (FISH OIL) 1200 MG CAPS Take 1,200 mg by mouth daily.        . predniSONE (DELTASONE) 10 MG tablet Take 10 mg by mouth daily.        . simvastatin (ZOCOR) 80 MG tablet Take 80 mg by mouth at bedtime.         Allergies as of 08/20/2011 - Review Complete 08/20/2011  Allergen Reaction Noted  . Doxycycline Nausea And Vomiting 06/05/2011  . Levofloxacin Nausea And Vomiting 03/07/2008   Family History  Problem Relation Age of Onset  . Stomach cancer Mother 48  . Heart attack Father   . Heart disease Brother   . Leukemia Brother   . Lung disease Son     infant  . Lung disease Daughter     infant   History   Social History  . Marital Status: Married    Spouse Name: N/A    Number of Children: 1  . Years of Education: N/A   Occupational History  . retired [employer]    Social History Main Topics  . Smoking status: Former Smoker -- 0.5 packs/day for 5 years    Types: Cigarettes    Quit  date: 09/23/1965  . Smokeless tobacco: Not on file  . Alcohol Use: No  . Drug Use: No  . Sexually Active: Not on file  Review of Systems: Gen: Denies any fever, chills, sweats, anorexia, fatigue, weakness, malaise, weight loss, and sleep disorder CV: Denies chest pain, angina, palpitations, syncope, orthopnea, PND, peripheral edema, and claudication. Resp: Denies dyspnea at rest, dyspnea with exercise, cough, sputum, wheezing, coughing up blood, and pleurisy. GI: Denies vomiting blood & jaundice. GU : Denies urinary burning, blood in urine, urinary frequency, urinary hesitancy, nocturnal urination, and urinary incontinence. MS: Chronic joint pain, limitation of movement, and swelling, stiffness, low back pain, extremity pain. Denies muscle weakness, cramps, atrophy.  Derm: Denies rash, itching, dry skin, hives, moles, warts, or unhealing ulcers.  Psych: Denies depression, anxiety, memory loss, suicidal ideation, hallucinations, paranoia, and confusion. Heme: Denies bruising, bleeding, and enlarged lymph nodes.  Physical Exam: BP 138/81  Pulse 77  Temp(Src) 97.4 F (36.3 C) (Temporal)  Ht 5' 8" (1.727 m)  Wt 168 lb 12.8 oz (76.567 kg)  BMI 25.67 kg/m2 General:   Alert,  Well-developed, well-nourished, pleasant and cooperative in NAD.  Accompanied by his wife today. Head:  Normocephalic and atraumatic. Eyes:  Sclera clear, no icterus.   Conjunctiva pink. Ears:  Normal auditory acuity. Nose:  No deformity, discharge,  or lesions. Mouth:  No deformity or lesions, oropharynx pink & moist. Neck:  Supple; no masses or thyromegaly. Lungs:  Clear throughout to auscultation.   No wheezes, crackles, or rhonchi. No acute distress. Heart:  Regular rate and rhythm; no murmurs, clicks, rubs,  or gallops. Abdomen:  Soft, nontender and nondistended. No masses, hepatosplenomegaly or hernias noted. Normal bowel sounds, without guarding, and without rebound.   Rectal:  Deferred until time of  colonoscopy.   Msk:  Symmetrical without chronic arthritic changes. Pulses:  Normal pulses noted. Extremities:  Without clubbing or edema. Neurologic:  Alert and  oriented x4;  grossly normal neurologically. Skin:  Intact without significant lesions or rashes. Cervical Nodes:  No significant cervical adenopathy. Psych:  Alert and cooperative. Normal mood and affect.    

## 2011-09-13 NOTE — Telephone Encounter (Signed)
Pt called this morning stating he had TCS/EGD done yesterday by RMR and he is now throwing up, can't keep his meds down or force a burp to come up. Please advise. Patient can be reached at [phone]

## 2011-09-13 NOTE — Interval H&P Note (Signed)
History and Physical Interval Note:  09/13/2011 11:54 AM  [name]  has presented today for surgery, with the diagnosis of food impaction  The various methods of treatment have been discussed with the patient and family. After consideration of risks, benefits and other options for treatment, the patient has consented to  Procedure(s): ESOPHAGOGASTRODUODENOSCOPY (EGD)  POSSIBLE DILATION as a surgical intervention .  The patients' history has been reviewed, patient examined, no change in status, stable for surgery.  I have reviewed the patients' chart and labs.  Questions were answered to the patient's satisfaction.     [employer]

## 2011-09-16 ENCOUNTER — Telehealth: Payer: Self-pay | Admitting: Internal Medicine

## 2011-09-16 LAB — STOOL CULTURE

## 2011-09-16 NOTE — Assessment & Plan Note (Signed)
Patient called me 09/14/11  stating dysphagia no better after 2 dilations in as many days. I told him he likely has achalasia and he is to have a manometry. Between and at times can be scheduled patient needs to stay on a full liquid, basically a gastroparesis-like, diet. Remain up right for one hour after consuming a meal.  Please schedule a manometry asap at [facility] or [facility] ASAP. Please let patient know of appointment time on Wednesday, December 26th

## 2011-09-16 NOTE — Telephone Encounter (Signed)
Patient still has dysphagia. He called me on December 22. He likely has achalasia. Please schedule a manometry at [facility] or [facility] ASAP. Please let patient know Wednesday, December 26

## 2011-09-18 NOTE — Telephone Encounter (Signed)
I spoke with [name] and she stated they will try and get the patient scheduled for Monday, 12/31.  I alos called the patient and made him aware of this.

## 2011-09-18 NOTE — Telephone Encounter (Signed)
I have forwarded this document to Chales Abrahams and Dr Christella Hartigan.

## 2011-09-18 NOTE — Telephone Encounter (Addendum)
09/30/11 [facility] arrive 830 am npo after midnight [facility]ology is closed on 09/23/11 this is the first Monday available. (only done on Mondays)

## 2011-09-18 NOTE — Telephone Encounter (Signed)
Addended by: Donata Duff on: 09/18/2011 01:37 PM   Modules accepted: Orders

## 2011-09-18 NOTE — Telephone Encounter (Signed)
Routed to CM

## 2011-09-19 NOTE — Telephone Encounter (Signed)
I spoke with [name] and gave him appt information.

## 2011-09-25 ENCOUNTER — Ambulatory Visit (INDEPENDENT_AMBULATORY_CARE_PROVIDER_SITE_OTHER): Payer: Medicare Other | Admitting: Cardiology

## 2011-09-25 ENCOUNTER — Encounter: Payer: Self-pay | Admitting: Cardiology

## 2011-09-25 DIAGNOSIS — M069 Rheumatoid arthritis, unspecified: Secondary | ICD-10-CM | POA: Insufficient documentation

## 2011-09-25 DIAGNOSIS — E039 Hypothyroidism, unspecified: Secondary | ICD-10-CM

## 2011-09-25 DIAGNOSIS — I779 Disorder of arteries and arterioles, unspecified: Secondary | ICD-10-CM

## 2011-09-25 DIAGNOSIS — J984 Other disorders of lung: Secondary | ICD-10-CM | POA: Insufficient documentation

## 2011-09-25 DIAGNOSIS — M47812 Spondylosis without myelopathy or radiculopathy, cervical region: Secondary | ICD-10-CM | POA: Insufficient documentation

## 2011-09-25 DIAGNOSIS — E119 Type 2 diabetes mellitus without complications: Secondary | ICD-10-CM | POA: Insufficient documentation

## 2011-09-25 DIAGNOSIS — I2510 Atherosclerotic heart disease of native coronary artery without angina pectoris: Secondary | ICD-10-CM

## 2011-09-25 DIAGNOSIS — F17201 Nicotine dependence, unspecified, in remission: Secondary | ICD-10-CM | POA: Insufficient documentation

## 2011-09-25 DIAGNOSIS — I10 Essential (primary) hypertension: Secondary | ICD-10-CM

## 2011-09-25 DIAGNOSIS — I739 Peripheral vascular disease, unspecified: Secondary | ICD-10-CM

## 2011-09-25 DIAGNOSIS — E785 Hyperlipidemia, unspecified: Secondary | ICD-10-CM

## 2011-09-25 NOTE — Assessment & Plan Note (Signed)
Total cholesterol, LDL and HDL levels were good when last assessed a year ago, but triglycerides were high.  This may have reflected suboptimal control of diabetes.  A repeat lipid profile will be obtained.  Patient previously could not be treated with atorvastatin in the past due to cost, but may be able to afford that medication now that it has a generic equivalent.

## 2011-09-25 NOTE — Progress Notes (Signed)
Patient ID: [name], male   DOB: 22-Aug-1946, 66 y.o.   MRN: [id number] HPI: Scheduled return visit for this lovely gentleman with coronary disease and rheumatoid arthritis.  Since last visit, he has done well from a cardiac standpoint.  Chest discomfort resolved after increasing his dose of PPI.  Colonoscopy showed C. Difficile for which he received a course of antibiotic treatment.  Upper endoscopy suggested esophageal dysmotility for which esophageal manometry as planned and possible dilatation.  Patient continues to have a good appetite but to have difficulty eating due to dysphasia.  He has minimal exertional dyspnea.  Prior to Admission medications   Medication Sig Start Date End Date Taking? Authorizing Provider  acetaminophen (TYLENOL) 500 MG tablet Take 1,000 mg by mouth daily.     Yes Historical Provider, MD  aspirin EC 81 MG tablet Take 81 mg by mouth daily.     Yes Historical Provider, MD  atenolol (TENORMIN) 25 MG tablet Take 25 mg by mouth daily.     Yes Historical Provider, MD  cholecalciferol (VITAMIN D) 400 UNITS TABS Take 400 Units by mouth daily.     Yes Historical Provider, MD  folic acid (FOLVITE) 1 MG tablet Take 1 mg by mouth daily.     Yes Historical Provider, MD  HYDROcodone-acetaminophen (NORCO) 5-325 MG per tablet Take 1 tablet by mouth 3 (three) times daily.  08/06/11  Yes Historical Provider, MD  ibuprofen (ADVIL,MOTRIN) 800 MG tablet Take 800 mg by mouth 2 (two) times daily.    Yes Historical Provider, MD  leflunomide (ARAVA) 20 MG tablet Take 20 mg by mouth daily.     Yes Historical Provider, MD  levothyroxine (SYNTHROID, LEVOTHROID) 100 MCG tablet Take 100 mcg by mouth daily.     Yes Historical Provider, MD  lisinopril (PRINIVIL,ZESTRIL) 20 MG tablet Take 20 mg by mouth daily.     Yes Historical Provider, MD  Niacin 1000 MG TBCR Take 2,000 mg by mouth daily.     Yes Historical Provider, MD  Omega-3 Fatty Acids (FISH OIL) 1200 MG CAPS Take 1,200 mg by mouth daily.      Yes Historical Provider, MD  omeprazole (PRILOSEC OTC) 20 MG tablet Take 20 mg by mouth 2 (two) times daily.     Yes Historical Provider, MD  predniSONE (DELTASONE) 10 MG tablet Take 10 mg by mouth daily.     Yes Historical Provider, MD  simvastatin (ZOCOR) 80 MG tablet Take 80 mg by mouth daily.    Yes Historical Provider, MD    Allergies  Allergen Reactions  . Amoxicillin Hives  . Doxycycline Nausea And Vomiting  . Levofloxacin Nausea And Vomiting      Past medical history, social history, and family history reviewed and updated.  ROS: Denies orthopnea, PND, pedal edema, palpitations, lightheadedness or syncope.  PHYSICAL EXAM: BP 110/70  Pulse 72  Ht 5\' 8"  (1.727 m)  Wt 71.233 kg (157 lb 0.6 oz)  BMI 23.88 kg/m2  General-Well developed; no acute distress Body habitus-proportionate weight and height Neck-No JVD; no carotid bruits Lungs-clear lung fields; resonant to percussion Cardiovascular-normal PMI; increased intensity of S1 and normal S2 Abdomen-normal bowel sounds; soft and non-tender without masses or organomegaly Musculoskeletal-Mild rheumatoid deformities of the hands, no cyanosis or clubbing Neurologic-Normal cranial nerves; symmetric strength and tone Skin-Warm, no significant lesions Extremities-distal pulses intact; no edema  ASSESSMENT AND PLAN:  Wagoner Bing, MD 09/25/2011 11:35 AM

## 2011-09-25 NOTE — Assessment & Plan Note (Signed)
TSH was normal in 09/2010 on current replacement therapy.

## 2011-09-25 NOTE — Assessment & Plan Note (Signed)
The patient has done extraordinarily well with no recurrent problems with coronary disease, now 15 years post CABG surgery.  We will continue optimal management of cardiovascular risk factors.

## 2011-09-25 NOTE — Patient Instructions (Signed)
**Note De-Identified  Obfuscation** Your physician recommends that you return for lab work in: THIS WEEK  Your physician recommends that you schedule a follow-up appointment in: 1 YEAR

## 2011-09-25 NOTE — Assessment & Plan Note (Signed)
Blood pressure control is excellent with current medications, which will be continued. 

## 2011-09-25 NOTE — Assessment & Plan Note (Addendum)
Therapy has been limited by recent GI problems, as patient has been advised not to use nonsteroidal medication.  Hopefully, anti-inflammatory medication can be resumed once his GI evaluation and treatment have been completed.

## 2011-09-25 NOTE — Assessment & Plan Note (Addendum)
Cerebrovascular disease is modest.  Repeat carotid ultrasound can be performed in 1-2 years.

## 2011-09-26 ENCOUNTER — Encounter (HOSPITAL_COMMUNITY): Payer: Self-pay | Admitting: Internal Medicine

## 2011-09-28 LAB — HEPATIC FUNCTION PANEL
Albumin: 3.9 g/dL (ref 3.5–5.2)
Bilirubin, Direct: 0.1 mg/dL (ref 0.0–0.3)
Total Bilirubin: 0.6 mg/dL (ref 0.3–1.2)

## 2011-09-28 LAB — LIPID PANEL
Cholesterol: 224 mg/dL — ABNORMAL HIGH (ref 0–200)
HDL: 48 mg/dL (ref >39)
Total CHOL/HDL Ratio: 4.7 Ratio
Triglycerides: 386 mg/dL — ABNORMAL HIGH (ref <150)
VLDL: 77 mg/dL — ABNORMAL HIGH (ref 0–40)

## 2011-09-30 ENCOUNTER — Encounter: Payer: Self-pay | Admitting: *Deleted

## 2011-09-30 ENCOUNTER — Other Ambulatory Visit: Payer: Self-pay | Admitting: *Deleted

## 2011-09-30 ENCOUNTER — Encounter (HOSPITAL_COMMUNITY)
Admission: RE | Disposition: A | Discharge status: Home / Self Care | Payer: Self-pay | Source: Ambulatory Visit | Attending: Gastroenterology

## 2011-09-30 ENCOUNTER — Ambulatory Visit (HOSPITAL_COMMUNITY)
Admission: RE | Admit: 2011-09-30 | Discharge: 2011-09-30 | Disposition: A | Discharge status: Home / Self Care | Payer: Medicare Other | Source: Ambulatory Visit | Attending: Gastroenterology | Admitting: Gastroenterology

## 2011-09-30 DIAGNOSIS — R1310 Dysphagia, unspecified: Secondary | ICD-10-CM | POA: Insufficient documentation

## 2011-09-30 DIAGNOSIS — E782 Mixed hyperlipidemia: Secondary | ICD-10-CM

## 2011-09-30 HISTORY — PX: ESOPHAGEAL MANOMETRY: SHX5429

## 2011-09-30 SURGERY — MANOMETRY, ESOPHAGUS
Anesthesia: Topical

## 2011-09-30 MED ORDER — ATORVASTATIN CALCIUM 80 MG PO TABS: 80.0000 mg | ORAL_TABLET | Freq: Every day | ORAL | Status: DC

## 2011-09-30 MED ORDER — LIDOCAINE VISCOUS 2 % MT SOLN
OROMUCOSAL | Status: AC
  Filled 2011-09-30: qty 15

## 2011-10-01 ENCOUNTER — Encounter (HOSPITAL_COMMUNITY): Payer: Self-pay

## 2011-10-01 ENCOUNTER — Encounter (HOSPITAL_COMMUNITY): Payer: Self-pay | Admitting: Gastroenterology

## 2011-10-02 ENCOUNTER — Telehealth: Payer: Self-pay | Admitting: Cardiology

## 2011-10-02 MED ORDER — ATORVASTATIN CALCIUM 80 MG PO TABS: 80.0000 mg | ORAL_TABLET | Freq: Every day | ORAL | Status: DC

## 2011-10-02 NOTE — Telephone Encounter (Signed)
Results and new medication orders discussed with patient.  Had sent letter yesterday with information contained.  Pt verbalizes understanding.

## 2011-10-02 NOTE — Telephone Encounter (Signed)
CALLING FOR LAB RESULTS THAT WERE DONE 09/28/11/TMJ

## 2011-10-04 ENCOUNTER — Telehealth: Payer: Self-pay | Admitting: Gastroenterology

## 2011-10-04 NOTE — Telephone Encounter (Signed)
Pt informed that the test results are not yet available and I will call as soon as Dr Christella Hartigan reviews Pt agreed and thanked me for calling

## 2011-10-07 ENCOUNTER — Telehealth: Payer: Self-pay | Admitting: Internal Medicine

## 2011-10-07 ENCOUNTER — Telehealth: Payer: Self-pay | Admitting: Gastroenterology

## 2011-10-07 NOTE — Telephone Encounter (Signed)
Esophageal manometry from last week  LESP 69 (10-45) Residual LES pressure 9.9 (<8) 90% simultaneous contractions, none were paristaltic.  Patty, Can you call him.  The manometry supports the diagnosis of achalasia.  I will also forward this to Dr. Jena Gauss and [facility] endoscopy was sending the tracings to Dr. Jena Gauss as well.  Thanks

## 2011-10-07 NOTE — Telephone Encounter (Signed)
I reviewed the report from Dr. Christella Hartigan regarding [name] manometry. I also reviewed tracings. It appears he does have achalasia. I doubt this is pseudo- achalasia. I have called [name] and informed him of these results. I have also called Dr. Lubertha South and informed him. I feel the best approach in this patient is to proceed with referral to [facility] for definitive treatment i.e. laparoscopic esophagomyotomy with DOR anti-reflex procedure. I recommended he see Dr. Francee Gentile as soon as this can be arranged. All parties are agreeable.

## 2011-10-07 NOTE — Telephone Encounter (Signed)
Pt aware.

## 2011-10-08 ENCOUNTER — Encounter: Payer: Self-pay | Admitting: Internal Medicine

## 2011-10-08 ENCOUNTER — Encounter: Payer: Self-pay | Admitting: Gastroenterology

## 2011-10-08 NOTE — Telephone Encounter (Signed)
The first available appt with Dr [name] is 02/13- they will mail pt a info packet- I faxed records- and will also mail pt a letter

## 2011-10-09 ENCOUNTER — Encounter: Payer: Self-pay | Admitting: Gastroenterology

## 2011-10-14 ENCOUNTER — Ambulatory Visit (HOSPITAL_COMMUNITY)
Admission: RE | Admit: 2011-10-14 | Discharge: 2011-10-14 | Disposition: A | Discharge status: Home / Self Care | Payer: Medicare Other | Source: Ambulatory Visit | Attending: Orthopaedic Surgery | Admitting: Orthopaedic Surgery

## 2011-10-14 DIAGNOSIS — E785 Hyperlipidemia, unspecified: Secondary | ICD-10-CM | POA: Insufficient documentation

## 2011-10-14 DIAGNOSIS — I10 Essential (primary) hypertension: Secondary | ICD-10-CM | POA: Insufficient documentation

## 2011-10-14 DIAGNOSIS — IMO0001 Reserved for inherently not codable concepts without codable children: Secondary | ICD-10-CM | POA: Insufficient documentation

## 2011-10-14 DIAGNOSIS — E119 Type 2 diabetes mellitus without complications: Secondary | ICD-10-CM | POA: Insufficient documentation

## 2011-10-14 DIAGNOSIS — M25519 Pain in unspecified shoulder: Secondary | ICD-10-CM | POA: Insufficient documentation

## 2011-10-14 DIAGNOSIS — M6281 Muscle weakness (generalized): Secondary | ICD-10-CM | POA: Insufficient documentation

## 2011-10-15 NOTE — Progress Notes (Signed)
Occupational Therapy Evaluation  Patient Details  Name: [name] MRN: [id number] Date of Birth: June 21, 1946  Today's Date: 10/15/2011 Time: 1030-1100 Time Calculation (min): 30 min  Visit#: 1  of 16   Re-eval: 11/15/11  Assessment Diagnosis: S/P Right Shoulder Scope Surgical Date: 08/01/11 Next MD Visit:  (unknown) Prior Therapy: yes  Past Medical History:  Past Medical History  Diagnosis Date  . Arteriosclerotic cardiovascular disease (ASCVD)     CABG  . Rheumatoid arthritis     with nephritis  . Hyperlipidemia   . Hypertension   . PVD (peripheral vascular disease)     Left iliac PCI/stent  . Cerebrovascular disease   . Tobacco abuse, in remission     Remote  . GERD (gastroesophageal reflux disease)   . Hypothyroid   . Allergic rhinitis   . Diabetes mellitus   . Nephrolithiasis   . Chronic lung disease     ?  Rheumatoid lung; ?  Adverse reaction to methotrexate  . Cervical spondylosis    Past Surgical History:  Past Surgical History  Procedure Date  . Coronary artery bypass graft     x6  . Total knee arthroplasty     Right  . Ankle fusion     Left  . Wrist fusion     Right  . Shoulder surgery   . Colonoscopy 09/12/2011    Procedure: COLONOSCOPY;  Surgeon: Corbin Ade, MD;  Location: [facility];  Service: Endoscopy;  Laterality: N/A;  12:00  . Esophagogastroduodenoscopy 09/13/2011    Procedure: ESOPHAGOGASTRODUODENOSCOPY (EGD);  Surgeon: Corbin Ade, MD;  Location: [facility];  Service: Endoscopy;  Laterality: N/A;  . Savory dilation 09/13/2011    Procedure: SAVORY DILATION;  Surgeon: Corbin Ade, MD;  Location: [facility];  Service: Endoscopy;  Laterality: N/A;  . Esophageal manometry 09/30/2011    Procedure: ESOPHAGEAL MANOMETRY (EM);  Surgeon: Rob Bunting, MD;  Location: [facility];  Service: Endoscopy;  Laterality: N/A;    Subjective Symptoms/Limitations Symptoms: S: "I had been coming to therapy here before and was doing  really well with very little pain and then I had to stop due to medical problems. It's giving me trouble again and has gotten very painful  over time." Limitations: [name] has had increased pain and decreased mobility in his right shoulder for more than 6 months. He had a shoulder scope completed 08/01/11 by Dr. Cleophas Dunker in an attempt to repair the rotator cuff but according to patient statement this was difficult due to level of arthritis. Mr. Tressa Busman had been seen by OT prior and had last OT visit 09/05/11. He had to stop therapy due to having a colonoscopy and endocopy for c-diff and weakened muscles in his esophogus. . Patient states he lost 30 lbs recently due to difficulty swallowing. He also states he may need a left ulnar nerve transposition. Currently [name] experiences increased pain, decreased mobility, weakness and decreased use of his right shoulder. Pain Assessment Currently in Pain?: Yes Pain Score:   5 Pain Location: Shoulder Pain Orientation: Right Pain Type: Acute pain Pain Onset: 1 to 4 weeks ago Multiple Pain Sites: No  Precautions/Restrictions  Precautions Precautions: Shoulder  Prior Functioning  Home Living Lives With: Spouse Receives Help From: Family Prior Function Level of Independence: Independent with basic ADLs;Independent with homemaking with ambulation Able to Take Stairs?: Yes Driving: Yes Vocation: Retired Leisure: Hobbies-yes (Comment) Comments: woodwork yeard work and fishing.  Assessment ADL/Vision/Perception Patient currently unable to  do yard work, fishing or wood work. He is unable to reach overhead into cabnets.    Cognition/Observation Cognition Overall Cognitive Status: Appears within functional limits for tasks assessed Arousal/Alertness: Awake/alert Orientation Level: Oriented X4  Sensation/Coordination/Edema WNL    Additional Assessments RUE AROM (degrees) Right Shoulder Flexion  0-170: 115 Degrees Right Shoulder  ABduction 0-140: 80 Degrees Right Shoulder Internal Rotation  0-70: 80 Degrees Right Shoulder External Rotation  0-90: 55 Degrees RUE PROM (degrees) Right Shoulder Flexion  0-170: 155 Degrees Right Shoulder ABduction 0-140: 155 Degrees Right Shoulder Internal Rotation  0-70: 60 Degrees Right Shoulder External Rotation  0-90: 48 Degrees LUE Assessment LUE Assessment: Exceptions to [facility] LUE AROM (degrees) Left Shoulder Flexion  0-170: 140 Degrees Left Shoulder ABduction 0-40: 126 Degrees Left Shoulder Internal Rotation  0-70: 70 Degrees Left Shoulder External Rotation  0-90: 50 Degrees LUE PROM (degrees) Left Shoulder Flexion  0-170: 164 Degrees Left Shoulder ABduction 0-40: 150 Degrees Left Shoulder Internal Rotation  0-70: 70 Degrees Left Shoulder External Rotation  0-90: 87 Degrees Palpation Palpation: Myofacial restrictions.       Occupational Therapy Assessment and Plan OT Assessment and Plan Clinical Impression Statement: A: [name] has right shoulder pain with decreased A/PROM due to recent surgery and moderate Myofacial restrictions. He is unable to use the right shoulder to reach for items . Rehab Potential: Excellent OT Frequency: Min 2X/week OT Duration: 8 weeks OT Treatment/Interventions: Self-care/ADL training;Therapeutic exercise;Therapeutic activities;Manual therapy;Patient/family education OT Plan: P: Skilled OT required to increase right shoulder mobility, decrease pain and restrictions and increase strength for return of functional use of reach.   Goals Short Term Goals Time to Complete Short Term Goals: 4 weeks Short Term Goal 1: Patient will be independent in HEP Short Term Goal 2: [name] will increase AROM of shoulder by 20 degrees AROM flex, Abduction. Short Term Goal 3: [name] will increase right shoulder strength to 3/5 for increased independence with yard work. Short Term Goal 4: Patient will decrease facial restrictions from mod to min Short Term  Goal 5: [name] will decrease pain in right shoulder to 2/10 Long Term Goals Time to Complete Long Term Goals: 8 weeks Long Term Goal 1: [name] will return to prior level of independence in yard work and return to fishing. Long Term Goal 2: Patient will increase right shoulder AROM to [facility] for increased ability to reach into overhead cabnets. Long Term Goal 3: [name] will increase right shoulder strength to 4+/5 for increased ability to complete yard work. Long Term Goal 4: Patient will decrease pain in right shoulder to 1/10 Long Term Goal 5: [name] will decrease fascial restrictions to trace in right shoulder  Problem List Patient Active Problem List  Diagnoses  . HYPOTHYROIDISM  . HYPERLIPIDEMIA  . ATHEROSCLEROTIC CARDIOVASCULAR DISEASE  . PERIPHERAL VASCULAR DISEASE  . GASTROESOPHAGEAL REFLUX DISEASE  . Carotid artery disease  . Hypertension  . Rheumatoid arthritis  . Tobacco abuse, in remission  . Diabetes mellitus  . Chronic lung disease  . Cervical spondylosis    End of Session Activity Tolerance: Patient tolerated treatment well General Behavior During Session: [facility] for tasks performed Cognition: [facility] for tasks performed   Lisa Roca OTR/L 10/15/2011, 6:11 PM  Physician Documentation Your signature is required to indicate approval of the treatment plan as stated above.  Please sign and either send electronically or make a copy of this report for your files and return this physician signed original.  Please mark one 1.__approve of plan  2. ___approve of plan with the following conditions.   ______________________________                                                          _____________________ Physician Signature                                                                                                             Date  

## 2011-10-18 ENCOUNTER — Ambulatory Visit (HOSPITAL_COMMUNITY): Payer: Medicare Other | Admitting: Occupational Therapy

## 2011-10-22 ENCOUNTER — Ambulatory Visit (HOSPITAL_COMMUNITY)
Admission: RE | Admit: 2011-10-22 | Discharge: 2011-10-22 | Disposition: A | Discharge status: Home / Self Care | Payer: Medicare Other | Source: Ambulatory Visit | Attending: Orthopaedic Surgery | Admitting: Orthopaedic Surgery

## 2011-10-22 NOTE — Progress Notes (Signed)
Occupational Therapy Treatment  Patient Details  Name: [name] MRN: [id number] Date of Birth: [birth year]  Today's Date: 10/22/2011 Time: 4098-1191 Manual Therapy 4782-9562 10' Therapeutic Exercise 1025-1055 30' Time Calculation (min): 40 min  Visit#: 2  of 16   Re-eval: 11/15/11    Subjective Symptoms/Limitations Symptoms: S: "I've got to go see [employer]. He is going to look at my left elbow making my nerve go numb. I had the nerve test last friday. The nerve in my elbow is not what is causing numbness in my hand. It is coming from my wrist. I go to [facility] the [facility] th for my esophogus." " Pain Assessment Currently in Pain?: Yes Pain Score:   5 Pain Location: Shoulder Pain Orientation: Right Pain Type: Acute pain Pain Onset: 1 to 4 weeks ago Pain Frequency: Several days a week Multiple Pain Sites: No  Precautions/Restrictions Go Gentle, as patient's shoulders are quite arthritic and RUE shoulder somewhat unstable.    Exercise/Treatments Supine Protraction: PROM;10 reps;AAROM;15 reps Horizontal ABduction: PROM;10 reps;AAROM;15 reps External Rotation: PROM;10 reps;AAROM;15 reps Internal Rotation: PROM;10 reps;AAROM;15 reps Flexion: PROM;10 reps;AAROM;15 reps ABduction: PROM;10 reps;AAROM;15 reps Seated Elevation: AROM;15 reps Extension: AROM;15 reps Retraction: AROM;15 reps Row: AROM;15 reps Horizontal ABduction: AAROM;15 reps Pulleys Flexion: 1 minute ABduction: 1 minute Therapy Ball Flexion: 20 reps ABduction: 20 reps ROM / Strengthening / Isometric Strengthening UBE (Upper Arm Bike): 3' and 3' level 1    Manual Therapy Manual Therapy: Joint mobilization Joint Mobilization: Gentle PROM to shoulder with special care to maintain joint position and necrease chance of pain.  Occupational Therapy Assessment and Plan OT Assessment and Plan Clinical Impression Statement: A: Mr. Cervenka Had Shoulder pain at beginning of therapy with only soreness by end  of session.  Rehab Potential: Excellent Clinical Impairments Affecting Rehab Potential: Arthritic somewhat unstable shoulder; anterior instability OT Frequency: Min 2X/week OT Duration: 8 weeks OT Treatment/Interventions: Self-care/ADL training;Therapeutic exercise;Therapeutic activities;Manual therapy;Patient/family education OT Plan: P: Continue work on slow progression and strengthening of UE. May start Isometric strength if it does not cause pain.   Problem List Patient Active Problem List  Diagnoses  . HYPOTHYROIDISM  . HYPERLIPIDEMIA  . ATHEROSCLEROTIC CARDIOVASCULAR DISEASE  . PERIPHERAL VASCULAR DISEASE  . GASTROESOPHAGEAL REFLUX DISEASE  . Carotid artery disease  . Hypertension  . Rheumatoid arthritis  . Tobacco abuse, in remission  . Diabetes mellitus  . Chronic lung disease  . Cervical spondylosis    End of Session Activity Tolerance: Patient tolerated treatment well General Behavior During Session: [facility] for tasks performed Cognition: [facility] for tasks performed   Lisa Roca OTR/L 10/22/2011, 12:01 PM

## 2011-10-24 ENCOUNTER — Ambulatory Visit (HOSPITAL_COMMUNITY)
Admission: RE | Admit: 2011-10-24 | Discharge: 2011-10-24 | Disposition: A | Discharge status: Home / Self Care | Payer: Medicare Other | Source: Ambulatory Visit | Attending: Orthopaedic Surgery | Admitting: Orthopaedic Surgery

## 2011-10-24 NOTE — Progress Notes (Signed)
Occupational Therapy Treatment  Patient Details  Name: [name] MRN: [id number] Date of Birth: [birth year]  Today's Date: 10/24/2011 Time: 1100-1140 Manual Therapy 1100-1115 15' Therapeutic Exercise 1115-1140 25' Time Calculation (min): 40 min  Visit#: 3  of 16   Re-eval: 11/15/11    Subjective Symptoms/Limitations Symptoms: S: " I went to see the dr. yesterday and he said he doubted that the shoulder was going to get much better and he thinks he is going to need to replace the shoulder. I need to get both elbows worked on and my esophagus gets worked on 2/13. Repetition: Decreases Symptoms Pain Assessment Currently in Pain?: Yes Pain Score:   4 Pain Location: Shoulder Pain Orientation: Right Pain Type: Acute pain Pain Frequency: Several days a week Pain Relieving Factors: rest  Effect of Pain on Daily Activities: Not able to sleep well. Multiple Pain Sites: No  Precautions/Restrictions     Exercise/Treatments Supine Protraction: PROM;AAROM Horizontal ABduction: PROM;AAROM;5 reps Flexion: PROM;AAROM;5 reps ABduction: PROM;AAROM;5 reps Seated Elevation: AROM;15 reps Extension: AROM;15 reps Retraction: AROM;15 reps Row: AROM;15 reps Pulleys Flexion: 1 minute Therapy Ball Flexion: 15 reps ABduction: 15 reps Other Therapy Ball Exercises: ball on table to train in HEP for gentle mobility ROM / Strengthening / Isometric Strengthening UBE (Upper Arm Bike): 3' and 3' level 1    Manual Therapy Manual Therapy: Joint mobilization Joint Mobilization: Gentle PROM and AAROM to decrease pain and maintain joint integrety.  Occupational Therapy Assessment and Plan OT Assessment and Plan Clinical Impression Statement: A: [name] decreased shoulder pain by end of session Rehab Potential: Excellent OT Frequency: Min 2X/week OT Duration: 8 weeks OT Treatment/Interventions: Self-care/ADL training;Therapeutic exercise;Manual therapy;Modalities;Therapeutic  activities;Patient/family education OT Plan: P: continue slow progressive strengthening.   Goals Short Term Goals Short Term Goal 1 Progress: Progressing toward goal Short Term Goal 2 Progress: Progressing toward goal Short Term Goal 3 Progress: Progressing toward goal Short Term Goal 4 Progress: Met Short Term Goal 5 Progress: Progressing toward goal  Problem List Patient Active Problem List  Diagnoses  . HYPOTHYROIDISM  . HYPERLIPIDEMIA  . ATHEROSCLEROTIC CARDIOVASCULAR DISEASE  . PERIPHERAL VASCULAR DISEASE  . GASTROESOPHAGEAL REFLUX DISEASE  . Carotid artery disease  . Hypertension  . Rheumatoid arthritis  . Tobacco abuse, in remission  . Diabetes mellitus  . Chronic lung disease  . Cervical spondylosis    End of Session Activity Tolerance: Patient tolerated treatment well General Behavior During Session: [facility] for tasks performed Cognition: [facility] for tasks performed   Lisa Roca OTR/L 10/24/2011, 11:48 AM

## 2011-10-28 ENCOUNTER — Telehealth (HOSPITAL_COMMUNITY): Payer: Self-pay

## 2011-10-29 ENCOUNTER — Ambulatory Visit (HOSPITAL_COMMUNITY): Payer: Medicare Other | Admitting: Occupational Therapy

## 2011-10-31 ENCOUNTER — Ambulatory Visit (HOSPITAL_COMMUNITY): Payer: Medicare Other | Admitting: Occupational Therapy

## 2011-11-05 ENCOUNTER — Ambulatory Visit (HOSPITAL_COMMUNITY): Payer: Medicare Other | Admitting: Occupational Therapy

## 2011-11-07 ENCOUNTER — Ambulatory Visit (HOSPITAL_COMMUNITY): Payer: Medicare Other | Admitting: Occupational Therapy

## 2011-11-12 HISTORY — PX: ESOPHAGOMYOTOMY: SHX5240

## 2011-11-13 ENCOUNTER — Other Ambulatory Visit: Payer: Self-pay | Admitting: *Deleted

## 2011-11-13 DIAGNOSIS — E782 Mixed hyperlipidemia: Secondary | ICD-10-CM

## 2011-11-21 ENCOUNTER — Telehealth: Payer: Self-pay | Admitting: *Deleted

## 2011-11-21 DIAGNOSIS — E782 Mixed hyperlipidemia: Secondary | ICD-10-CM

## 2011-12-09 ENCOUNTER — Encounter: Payer: Self-pay | Admitting: *Deleted

## 2011-12-18 ENCOUNTER — Encounter: Payer: Self-pay | Admitting: Gastroenterology

## 2011-12-18 ENCOUNTER — Ambulatory Visit (INDEPENDENT_AMBULATORY_CARE_PROVIDER_SITE_OTHER): Payer: Medicare Other | Admitting: Gastroenterology

## 2011-12-18 VITALS — BP 143/80 | HR 70 | Temp 98.3°F | Ht 68.0 in | Wt 157.0 lb

## 2011-12-18 DIAGNOSIS — Z8619 Personal history of other infectious and parasitic diseases: Secondary | ICD-10-CM

## 2011-12-18 DIAGNOSIS — K220 Achalasia of cardia: Secondary | ICD-10-CM | POA: Insufficient documentation

## 2011-12-18 DIAGNOSIS — K219 Gastro-esophageal reflux disease without esophagitis: Secondary | ICD-10-CM

## 2011-12-18 NOTE — Assessment & Plan Note (Signed)
Doing very well on omeprazole 20 mg twice a day. Continue chronically. Office visit when necessary.

## 2011-12-18 NOTE — Assessment & Plan Note (Signed)
Avoid unnecessary antibiotics. Recommend probiotics especially with antibiotics and for one month afterwards. He will let us know if he has any alteration in his bowel habits, diarrhea.

## 2011-12-18 NOTE — Progress Notes (Signed)
Primary Care Physician: [name], MD, MD  Primary Gastroenterologist:    Chief Complaint  Patient presents with  . Follow-up    doing good    HPI: [name] is a 66 y.o. male here for three-month followup of EGD and colonoscopy. Back in December he underwent a colonoscopy for diarrhea and an upper endoscopy for dysphagia. He had a normal colon and normal terminal ileum, random biopsies were negative. Stool came back positive for C. Difficile. EGD shows showed a Schatzki ring status post dilation. The following day he was having vomiting and inability to swallow therefore he had a barium pill esophagram that showed obstruction. Patient subsequently underwent a repeat endoscopy by Dr. Darrick Penna, again noted an esophageal range as well as a distal esophageal also probably from previous dilation, mild gastritis, savory dilation to 16 mm. Patient then underwent esophageal manometry by Dr. Rob Bunting. Diagnosis of achalasia was given.  Patient was referred to [facility] for laparoscopic esophagomyotomy with DOR antireflux surgery. States his surgery was on 11/12/11. Postoperatively he was doing well up until one week after surgery. He had recurrent C. Difficile. He completed 2 week or so Flagyl. Align daily. BM back to normal. Appetite improving. Went back for f/u 2 weeks ago, added soft meats/foods, gradually adding back solid foods. No heartburn. No dominant pain, constipation, diarrhea, melena, rectal bleeding. His swallowing has dramatically improved. He is very pleased with his outcome.    Current Outpatient Prescriptions  Medication Sig Dispense Refill  . acetaminophen (TYLENOL) 500 MG tablet Take 1,000 mg by mouth daily.        Marland Kitchen aspirin EC 81 MG tablet Take 81 mg by mouth daily.        Marland Kitchen atenolol (TENORMIN) 25 MG tablet Take 25 mg by mouth daily.        Marland Kitchen atorvastatin (LIPITOR) 80 MG tablet Take 1 tablet (80 mg total) by mouth daily.  90 tablet  3  .  cholecalciferol (VITAMIN D) 400 UNITS TABS Take 400 Units by mouth daily.        . folic acid (FOLVITE) 1 MG tablet Take 1 mg by mouth daily.        Marland Kitchen HYDROcodone-acetaminophen (NORCO) 5-325 MG per tablet Take 1 tablet by mouth 3 (three) times daily.       Marland Kitchen ibuprofen (ADVIL,MOTRIN) 800 MG tablet Take 800 mg by mouth 2 (two) times daily.       Marland Kitchen leflunomide (ARAVA) 20 MG tablet Take 20 mg by mouth daily.        Marland Kitchen levothyroxine (SYNTHROID, LEVOTHROID) 100 MCG tablet Take 88 mcg by mouth daily.       Marland Kitchen lisinopril (PRINIVIL,ZESTRIL) 20 MG tablet Take 20 mg by mouth daily.        . Niacin 1000 MG TBCR Take 2,000 mg by mouth daily.        . Omega-3 Fatty Acids (FISH OIL) 1200 MG CAPS Take 1,200 mg by mouth daily.        Marland Kitchen omeprazole (PRILOSEC OTC) 20 MG tablet Take 20 mg by mouth 2 (two) times daily.        . predniSONE (DELTASONE) 10 MG tablet Take 10 mg by mouth daily.          Allergies as of 12/18/2011 - Review Complete 12/18/2011  Allergen Reaction Noted  . Amoxicillin Hives 08/24/2011  . Doxycycline Nausea And Vomiting 06/05/2011  . Levofloxacin Nausea And Vomiting 03/07/2008    ROS:  General: Negative for anorexia, weight loss, fever, chills, fatigue, weakness. ENT: Negative for hoarseness, difficulty swallowing , nasal congestion. CV: Negative for chest pain, angina, palpitations, dyspnea on exertion, peripheral edema.  Respiratory: Negative for dyspnea at rest, dyspnea on exertion, cough, sputum, wheezing.  GI: See history of present illness. GU:  Negative for dysuria, hematuria, urinary incontinence, urinary frequency, nocturnal urination.  Endo: Negative for unusual weight change.    Physical Examination:   BP 143/80  Pulse 70  Temp(Src) 98.3 F (36.8 C) (Temporal)  Ht 5\' 8"  (1.727 m)  Wt 157 lb (71.215 kg)  BMI 23.87 kg/m2  General: Well-nourished, well-developed in no acute distress.  Eyes: No icterus. Mouth: Oropharyngeal mucosa moist and pink , no lesions erythema  or exudate. Abdomen: Bowel sounds are normal, nontender, nondistended, no hepatosplenomegaly or masses, no abdominal bruits or hernia , no rebound or guarding.   Extremities: No lower extremity edema. No clubbing or deformities. Neuro: Alert and oriented x 4   Skin: Warm and dry, no jaundice.   Psych: Alert and cooperative, normal mood and affect.

## 2011-12-18 NOTE — Patient Instructions (Signed)
I am very pleased to see that you're doing well. Please let us know if he had any recurrent issues swallowing or recurrent diarrhea.

## 2011-12-18 NOTE — Progress Notes (Signed)
Faxed to PCP

## 2011-12-18 NOTE — Assessment & Plan Note (Signed)
Status post surgery as outlined above. He is doing very well. Appetite is improving. He's been able eat soft meats, bread this week. He has one more followup planned with his surgeon. Denies any heartburn.

## 2011-12-24 ENCOUNTER — Other Ambulatory Visit: Payer: Self-pay | Admitting: Cardiology

## 2011-12-25 LAB — LIPID PANEL
LDL Cholesterol: 132 mg/dL — ABNORMAL HIGH (ref 0–99)
VLDL: 69 mg/dL — ABNORMAL HIGH (ref 0–40)

## 2011-12-27 ENCOUNTER — Encounter: Payer: Self-pay | Admitting: *Deleted

## 2011-12-27 ENCOUNTER — Other Ambulatory Visit: Payer: Self-pay | Admitting: *Deleted

## 2011-12-27 DIAGNOSIS — E782 Mixed hyperlipidemia: Secondary | ICD-10-CM

## 2011-12-27 MED ORDER — EZETIMIBE 10 MG PO TABS: 10.0000 mg | ORAL_TABLET | Freq: Every day | ORAL | Status: DC

## 2011-12-31 ENCOUNTER — Telehealth: Payer: Self-pay | Admitting: Cardiology

## 2011-12-31 NOTE — Telephone Encounter (Signed)
Patient states that he has been put on a cholesterol medicine that he can not afford.  Could not remember what the name of it was though.  / tg

## 2011-12-31 NOTE — Telephone Encounter (Signed)
Zetia samples were provided

## 2012-01-24 ENCOUNTER — Other Ambulatory Visit: Payer: Self-pay | Admitting: *Deleted

## 2012-01-24 DIAGNOSIS — E782 Mixed hyperlipidemia: Secondary | ICD-10-CM

## 2012-01-30 ENCOUNTER — Encounter: Payer: Self-pay | Admitting: *Deleted

## 2012-02-12 ENCOUNTER — Encounter: Payer: Self-pay | Admitting: *Deleted

## 2012-03-04 ENCOUNTER — Emergency Department (HOSPITAL_COMMUNITY)
Admission: EM | Admit: 2012-03-04 | Discharge: 2012-03-04 | Disposition: A | Discharge status: Home / Self Care | Payer: Medicare Other | Attending: Emergency Medicine | Admitting: Emergency Medicine

## 2012-03-04 ENCOUNTER — Encounter (HOSPITAL_COMMUNITY): Payer: Self-pay | Admitting: *Deleted

## 2012-03-04 DIAGNOSIS — I10 Essential (primary) hypertension: Secondary | ICD-10-CM | POA: Insufficient documentation

## 2012-03-04 DIAGNOSIS — M069 Rheumatoid arthritis, unspecified: Secondary | ICD-10-CM | POA: Insufficient documentation

## 2012-03-04 DIAGNOSIS — Z8673 Personal history of transient ischemic attack (TIA), and cerebral infarction without residual deficits: Secondary | ICD-10-CM | POA: Insufficient documentation

## 2012-03-04 DIAGNOSIS — M47812 Spondylosis without myelopathy or radiculopathy, cervical region: Secondary | ICD-10-CM | POA: Insufficient documentation

## 2012-03-04 DIAGNOSIS — M25579 Pain in unspecified ankle and joints of unspecified foot: Secondary | ICD-10-CM | POA: Insufficient documentation

## 2012-03-04 DIAGNOSIS — E785 Hyperlipidemia, unspecified: Secondary | ICD-10-CM | POA: Insufficient documentation

## 2012-03-04 DIAGNOSIS — Z87891 Personal history of nicotine dependence: Secondary | ICD-10-CM | POA: Insufficient documentation

## 2012-03-04 DIAGNOSIS — K219 Gastro-esophageal reflux disease without esophagitis: Secondary | ICD-10-CM | POA: Insufficient documentation

## 2012-03-04 MED ORDER — DEXAMETHASONE 6 MG PO TABS: ORAL_TABLET | ORAL | Status: AC

## 2012-03-04 MED ORDER — HYDROCODONE-ACETAMINOPHEN 7.5-325 MG PO TABS: 1.0000 | ORAL_TABLET | ORAL | Status: AC | PRN

## 2012-03-04 NOTE — Discharge Instructions (Signed)
Please use decadron daily with food. Norco for pain if needed. Take with food. This medication may cause drowsiness, use with caution. Please see Dr Gerda Diss or return to the ED if not improving.Arthralgia Your caregiver has diagnosed you as suffering from an arthralgia. Arthralgia means there is pain in a joint. This can come from many reasons including:  Bruising the joint which causes soreness (inflammation) in the joint.   Wear and tear on the joints which occur as we grow older (osteoarthritis).   Overusing the joint.   Various forms of arthritis.   Infections of the joint.  Regardless of the cause of pain in your joint, most of these different pains respond to anti-inflammatory drugs and rest. The exception to this is when a joint is infected, and these cases are treated with antibiotics, if it is a bacterial infection. HOME CARE INSTRUCTIONS   Rest the injured area for as long as directed by your caregiver. Then slowly start using the joint as directed by your caregiver and as the pain allows. Crutches as directed may be useful if the ankles, knees or hips are involved. If the knee was splinted or casted, continue use and care as directed. If an stretchy or elastic wrapping bandage has been applied today, it should be removed and re-applied every 3 to 4 hours. It should not be applied tightly, but firmly enough to keep swelling down. Watch toes and feet for swelling, bluish discoloration, coldness, numbness or excessive pain. If any of these problems (symptoms) occur, remove the ace bandage and re-apply more loosely. If these symptoms persist, contact your caregiver or return to this location.   For the first 24 hours, keep the injured extremity elevated on pillows while lying down.   Apply ice for 15 to 20 minutes to the sore joint every couple hours while awake for the first half day. Then 3 to 4 times per day for the first 48 hours. Put the ice in a plastic bag and place a towel between  the bag of ice and your skin.   Wear any splinting, casting, elastic bandage applications, or slings as instructed.   Only take over-the-counter or prescription medicines for pain, discomfort, or fever as directed by your caregiver. Do not use aspirin immediately after the injury unless instructed by your physician. Aspirin can cause increased bleeding and bruising of the tissues.   If you were given crutches, continue to use them as instructed and do not resume weight bearing on the sore joint until instructed.  Persistent pain and inability to use the sore joint as directed for more than 2 to 3 days are warning signs indicating that you should see a caregiver for a follow-up visit as soon as possible. Initially, a hairline fracture (break in bone) may not be evident on X-rays. Persistent pain and swelling indicate that further evaluation, non-weight bearing or use of the joint (use of crutches or slings as instructed), or further X-rays are indicated. X-rays may sometimes not show a small fracture until a week or 10 days later. Make a follow-up appointment with your own caregiver or one to whom we have referred you. A radiologist (specialist in reading X-rays) may read your X-rays. Make sure you know how you are to obtain your X-ray results. Do not assume everything is normal if you do not hear from [place]. SEEK MEDICAL CARE IF: Bruising, swelling, or pain increases. SEEK IMMEDIATE MEDICAL CARE IF:   Your fingers or toes are numb or blue.  The pain is not responding to medications and continues to stay the same or get worse.   The pain in your joint becomes severe.   You develop a fever over 102 F (38.9 C).   It becomes impossible to move or use the joint.  MAKE SURE YOU:   Understand these instructions.   Will watch your condition.   Will get help right away if you are not doing well or get worse.  Document Released: 09/09/2005 Document Revised: 08/29/2011 Document Reviewed:  04/27/2008 [facility] Patient Information 2012 [place], [place].

## 2012-03-04 NOTE — ED Notes (Signed)
C/o left ankle pain with swelling/redness since last night.  Denies injury.

## 2012-03-04 NOTE — ED Provider Notes (Signed)
Medical screening examination/treatment/procedure(s) were performed by non-physician practitioner and as supervising physician I was immediately available for consultation/collaboration.   Joya Gaskins, MD 03/04/12 1056

## 2012-03-04 NOTE — ED Provider Notes (Signed)
History     CSN: [id number]  Arrival date & time 03/04/12  1001   None     Chief Complaint  Patient presents with  . Ankle Pain    (Consider location/radiation/quality/duration/timing/severity/associated sxs/prior treatment) Patient is a 66 y.o. male presenting with ankle pain. The history is provided by the patient.  Ankle Pain  The incident occurred yesterday. There was no injury mechanism. The pain is present in the left ankle. The quality of the pain is described as burning and sharp. The pain is severe. The pain has been worsening since onset. Associated symptoms include inability to bear weight and tingling. Pertinent negatives include no loss of motion and no loss of sensation. He reports no foreign bodies present. He has tried heat for the symptoms. The treatment provided no relief.    Past Medical History  Diagnosis Date  . Arteriosclerotic cardiovascular disease (ASCVD)     CABG  . Rheumatoid arthritis     with nephritis  . Hyperlipidemia   . Hypertension   . PVD (peripheral vascular disease)     Left iliac PCI/stent  . Cerebrovascular disease   . Tobacco abuse, in remission     Remote  . GERD (gastroesophageal reflux disease)   . Hypothyroid   . Allergic rhinitis   . Diabetes mellitus   . Nephrolithiasis   . Chronic lung disease     ?  Rheumatoid lung; ?  Adverse reaction to methotrexate  . Cervical spondylosis     Past Surgical History  Procedure Date  . Coronary artery bypass graft     x6  . Total knee arthroplasty     Right  . Ankle fusion     Left  . Wrist fusion     Right  . Shoulder surgery   . Colonoscopy 09/12/2011    Dr. Elly Modena hemorrhoids, normal colon and distal terminal ileum. Random bx negative. Stool for CDiff positive.  . Esophagogastroduodenoscopy 09/13/2011    Dr. Lyndle Herrlich plawues mid-esophagus KOH negative. Distal esophageal ring and ulcer. Mild gastritis. duodenal diverticulum. Savaory dilation 16mm.   Gaspar Bidding  dilation 09/13/2011  . Esophageal manometry 09/30/2011    Procedure: ESOPHAGEAL MANOMETRY (EM);  Surgeon: Rob Bunting, MD;  Location: [facility];  Service: Endoscopy;  Laterality: N/A;  . Esophagogastroduodenoscopy 09/12/2011    Dr. Rinaldo Ratel rings s/p dilation  . Cardiac surgery     Family History  Problem Relation Age of Onset  . Stomach cancer Mother 9  . Heart attack Father   . Heart disease Brother   . Leukemia Brother   . Lung disease Son     infant  . Lung disease Daughter     infant    History  Substance Use Topics  . Smoking status: Former Smoker -- 0.5 packs/day for 5 years    Types: Cigarettes    Quit date: 09/23/1965  . Smokeless tobacco: Not on file  . Alcohol Use: No      Review of Systems  Constitutional: Negative for activity change.       All ROS Neg except as noted in HPI  HENT: Negative for nosebleeds and neck pain.   Eyes: Negative for photophobia and discharge.  Respiratory: Negative for cough, shortness of breath and wheezing.   Cardiovascular: Positive for chest pain. Negative for palpitations.  Gastrointestinal: Negative for abdominal pain and blood in stool.  Genitourinary: Negative for dysuria, frequency and hematuria.  Musculoskeletal: Positive for arthralgias. Negative for back pain.  Skin: Negative.   Neurological:  Positive for tingling. Negative for dizziness, seizures and speech difficulty.  Psychiatric/Behavioral: Negative for hallucinations and confusion.    Allergies  Amoxicillin; Doxycycline; and Levofloxacin  Home Medications   Current Outpatient Rx  Name Route Sig Dispense Refill  . ACETAMINOPHEN 500 MG PO TABS Oral Take 1,000 mg by mouth daily.      . ASPIRIN EC 81 MG PO TBEC Oral Take 81 mg by mouth daily.      . ATENOLOL 25 MG PO TABS Oral Take 25 mg by mouth daily.      . ATORVASTATIN CALCIUM 80 MG PO TABS Oral Take 1 tablet (80 mg total) by mouth daily. 90 tablet 3  . CHOLECALCIFEROL 400 UNITS PO TABS Oral  Take 400 Units by mouth daily.      Marland Kitchen EZETIMIBE 10 MG PO TABS Oral Take 1 tablet (10 mg total) by mouth daily. 30 tablet 12  . FOLIC ACID 1 MG PO TABS Oral Take 1 mg by mouth daily.      Marland Kitchen HYDROCODONE-ACETAMINOPHEN 5-325 MG PO TABS Oral Take 1 tablet by mouth 3 (three) times daily.     . IBUPROFEN 800 MG PO TABS Oral Take 800 mg by mouth 2 (two) times daily.     Marland Kitchen LEFLUNOMIDE 20 MG PO TABS Oral Take 20 mg by mouth daily.      Marland Kitchen LEVOTHYROXINE SODIUM 100 MCG PO TABS Oral Take 88 mcg by mouth daily.     Marland Kitchen LISINOPRIL 20 MG PO TABS Oral Take 20 mg by mouth daily.      Marland Kitchen NIACIN 1000 MG PO TBCR Oral Take 2,000 mg by mouth daily.      Marland Kitchen FISH OIL 1200 MG PO CAPS Oral Take 1,200 mg by mouth daily.      Marland Kitchen OMEPRAZOLE MAGNESIUM 20 MG PO TBEC Oral Take 20 mg by mouth 2 (two) times daily.      Marland Kitchen PREDNISONE 10 MG PO TABS Oral Take 10 mg by mouth daily.      Marland Kitchen ALIGN 4 MG PO CAPS Oral Take 4 mg by mouth daily.      BP 142/62  Pulse 63  Temp 97.6 F (36.4 C) (Oral)  Resp 20  Ht 5\' 8"  (1.727 m)  Wt 153 lb (69.4 kg)  BMI 23.26 kg/m2  SpO2 98%  Physical Exam  Nursing note and vitals reviewed. Constitutional: He is oriented to person, place, and time. He appears well-developed and well-nourished.  Non-toxic appearance.  HENT:  Head: Normocephalic.  Right Ear: Tympanic membrane and external ear normal.  Left Ear: Tympanic membrane and external ear normal.  Eyes: EOM and lids are normal. Pupils are equal, round, and reactive to light.  Neck: Normal range of motion. Neck supple. Carotid bruit is not present.  Cardiovascular: Normal rate, regular rhythm, normal heart sounds, intact distal pulses and normal pulses.   Pulmonary/Chest: Breath sounds normal. No respiratory distress.  Abdominal: Soft. Bowel sounds are normal. There is no tenderness. There is no guarding.  Musculoskeletal: Normal range of motion.       There is deformity of the left ankle due to previous fusion surgery. No hot joints. DEcrease  ROM noted. Distal pulse and sensory wnl. Achilles intact. Pain to palpation at the lateral malleolus. No swelling or redness. Neg Homan's sign.  Lymphadenopathy:       Head (right side): No submandibular adenopathy present.       Head (left side): No submandibular adenopathy present.    He has no  cervical adenopathy.  Neurological: He is alert and oriented to person, place, and time. He has normal strength. No cranial nerve deficit or sensory deficit.  Skin: Skin is warm and dry.  Psychiatric: He has a normal mood and affect. His speech is normal.    ED Course  Procedures (including critical care time)  Labs Reviewed - No data to display No results found.   No diagnosis found.    MDM  I have reviewed nursing notes, vital signs, and all appropriate lab and imaging results for this patient. Pt has had fusion surg. Of the left ankle. He has been walking more in the past 2 weeks. Suspect tendonitis and inflammation related pain. Will treat with dexamethasone and Norco.       Kathie Dike, PA 03/04/12 1036

## 2012-05-29 ENCOUNTER — Encounter: Payer: Self-pay | Admitting: Internal Medicine

## 2012-05-29 ENCOUNTER — Ambulatory Visit (INDEPENDENT_AMBULATORY_CARE_PROVIDER_SITE_OTHER): Payer: Medicare Other | Admitting: Internal Medicine

## 2012-05-29 VITALS — BP 107/58 | HR 71 | Temp 98.1°F | Ht 68.0 in | Wt 151.4 lb

## 2012-05-29 DIAGNOSIS — R1310 Dysphagia, unspecified: Secondary | ICD-10-CM

## 2012-05-29 DIAGNOSIS — K220 Achalasia of cardia: Secondary | ICD-10-CM

## 2012-05-29 NOTE — Progress Notes (Signed)
Primary Care Physician:  LUKING,W S, MD Primary Gastroenterologist:  Dr. Tiran Sauseda  Pre-Procedure History & Physical: HPI:  [name] is a 66 y.o. male here for achalasia - status post Heller myotomy and Dor. antireflux procedure.  Postoperative course complicated by C. difficile infection. Has done well on omeprazole 20 mg orally twice a day. No dysphagia until the past couple of weeks when he started having recurrent esophageal dysphagia to solids. No polyps postop barium studies or as I can see. Has not had a postoperative EGD. Denies reflux symptoms on his current acid suppression regimen. Has had problems with the fracture his left foot the setting of rheumatoid arthritis. Bowel function now back to normal.  Past Medical History  Diagnosis Date  . Arteriosclerotic cardiovascular disease (ASCVD)     CABG  . Rheumatoid arthritis     with nephritis  . Hyperlipidemia   . Hypertension   . PVD (peripheral vascular disease)     Left iliac PCI/stent  . Cerebrovascular disease   . Tobacco abuse, in remission     Remote  . GERD (gastroesophageal reflux disease)   . Hypothyroid   . Allergic rhinitis   . Diabetes mellitus   . Nephrolithiasis   . Chronic lung disease     ?  Rheumatoid lung; ?  Adverse reaction to methotrexate  . Cervical spondylosis   . Schatzki's ring   . Hx of Clostridium difficile infection   . Achalasia   . Hiatal hernia     Past Surgical History  Procedure Date  . Coronary artery bypass graft     x6  . Total knee arthroplasty     Right  . Ankle fusion     Left  . Wrist fusion     Right  . Shoulder surgery   . Colonoscopy 09/12/2011    Dr. Tahsin Benyo-->internal hemorrhoids, normal colon and distal terminal ileum. Random bx negative. Stool for CDiff positive.  . Esophagogastroduodenoscopy 09/13/2011    Dr. Fields-->white plawues mid-esophagus KOH negative. Distal esophageal ring and ulcer. Mild gastritis. duodenal diverticulum. Savaory dilation 16mm.   . Savory  dilation 09/13/2011  . Esophageal manometry 09/30/2011    Procedure: ESOPHAGEAL MANOMETRY (EM);  Surgeon: Daniel Jacobs, MD;  Location: [facility];  Service: Endoscopy;  Laterality: N/A;  . Esophagogastroduodenoscopy 09/12/2011    Dr. Naylene Foell-->schatzki rings s/p dilation  . Cardiac surgery   . Esophagomyotomy 11/12/11    [facility]- with DOR antireflux surgery    Prior to Admission medications   Medication Sig Start Date End Date Taking? Authorizing Provider  acetaminophen (TYLENOL) 500 MG tablet Take 1,000 mg by mouth daily.     Yes Historical Provider, MD  aspirin EC 81 MG tablet Take 81 mg by mouth daily.     Yes Historical Provider, MD  atenolol (TENORMIN) 25 MG tablet Take 25 mg by mouth daily.     Yes Historical Provider, MD  atorvastatin (LIPITOR) 80 MG tablet Take 1 tablet (80 mg total) by mouth daily. 10/02/11 10/01/12 Yes Ulyssa Walthour M Rothbart, MD  cholecalciferol (VITAMIN D) 400 UNITS TABS Take 400 Units by mouth daily.    Yes Historical Provider, MD  ezetimibe (ZETIA) 10 MG tablet Take 1 tablet (10 mg total) by mouth daily. 12/27/11 12/26/12 Yes Alvin Diffee M Rothbart, MD  folic acid (FOLVITE) 1 MG tablet Take 1 mg by mouth daily.     Yes Historical Provider, MD  HYDROcodone-acetaminophen (NORCO) 5-325 MG per tablet Take 1 tablet by mouth 3 (three) times daily.    08/06/11  Yes Historical Provider, MD  ibuprofen (ADVIL,MOTRIN) 800 MG tablet Take 800 mg by mouth 2 (two) times daily.    Yes Historical Provider, MD  leflunomide (ARAVA) 20 MG tablet Take 20 mg by mouth daily.     Yes Historical Provider, MD  levothyroxine (SYNTHROID, LEVOTHROID) 100 MCG tablet Take 88 mcg by mouth daily.    Yes Historical Provider, MD  lisinopril (PRINIVIL,ZESTRIL) 20 MG tablet Take 20 mg by mouth daily.     Yes Historical Provider, MD  Niacin 1000 MG TBCR Take 2,000 mg by mouth daily.     Yes Historical Provider, MD  Omega-3 Fatty Acids (FISH OIL) 1200 MG CAPS Take 1,200 mg by mouth daily.     Yes Historical Provider, MD    omeprazole (PRILOSEC OTC) 20 MG tablet Take 20 mg by mouth 2 (two) times daily.     Yes Historical Provider, MD  predniSONE (DELTASONE) 10 MG tablet Take 10 mg by mouth daily.     Yes Historical Provider, MD  Probiotic Product (ALIGN) 4 MG CAPS Take 4 mg by mouth daily.   Yes Historical Provider, MD  UNABLE TO FIND forteo  20 mcg per dose   Yes Historical Provider, MD  Vitamin D, Ergocalciferol, (DRISDOL) 50000 UNITS CAPS Take 50,000 Units by mouth every 7 (seven) days.   Yes Historical Provider, MD    Allergies as of 05/29/2012 - Review Complete 05/29/2012  Allergen Reaction Noted  . Amoxicillin Hives 08/24/2011  . Doxycycline Nausea And Vomiting 06/05/2011  . Levofloxacin Nausea And Vomiting 03/07/2008    Family History  Problem Relation Age of Onset  . Stomach cancer Mother 48  . Heart attack Father   . Heart disease Brother   . Leukemia Brother   . Lung disease Son     infant  . Lung disease Daughter     infant    History   Social History  . Marital Status: Married    Spouse Name: N/A    Number of Children: 1  . Years of Education: N/A   Occupational History  . retired [employer]    Social History Main Topics  . Smoking status: Former Smoker -- 0.5 packs/day for 5 years    Types: Cigarettes    Quit date: 09/23/1965  . Smokeless tobacco: Not on file  . Alcohol Use: No  . Drug Use: No  . Sexually Active: Not on file   Other Topics Concern  . Not on file   Social History Narrative  . No narrative on file    Review of Systems: See HPI, otherwise negative ROS  Physical Exam: BP 107/58  Pulse 71  Temp 98.1 F (36.7 C) (Temporal)  Ht 5' 8" (1.727 m)  Wt 151 lb 6.4 oz (68.675 kg)  BMI 23.02 kg/m2 General:   Alert,  somewhat frail, thin elderly pleasant and cooperative in NAD. Accompanied by his wife Skin:  Intact without significant lesions or rashes. Eyes:  Sclera clear, no icterus.   Conjunctiva pink. Ears:  Normal auditory acuity. Nose:   No deformity, discharge,  or lesions. Mouth:  No deformity or lesions. Neck:  Supple; no masses or thyromegaly. No significant cervical adenopathy. Lungs:  Clear throughout to auscultation.   No wheezes, crackles, or rhonchi. No acute distress. Heart:  Regular rate and rhythm; no murmurs, clicks, rubs,  or gallops. Abdomen: Non-distended, normal bowel sounds.  Soft and nontender without appreciable mass or hepatosplenomegaly.  Pulses:  Normal pulses noted. Extremities:    Without clubbing or edema.  Impression/Plan:  Achalasia. Status post esophagomyotomy and Dor antireflux procedure.  Now with recurrent esophageal dysphagia. No GERD symptoms on twice a day PPI therapy. Discussed the potential for recurrent esophageal dysphagia in the setting and the likelihood of ongoing GERD post-myotomy. Also discussed the increased risk of esophageal cancer in this setting.  Recommendations: Barium pill esophagram to noninvasively evaluate his dysphagia symptoms and size up his anatomy post surgery. I told he and his wife he may well likely need to have an EGD in the near future and even dilation. Will make further recommendations once I have reviewed the BPE. 

## 2012-05-29 NOTE — Progress Notes (Deleted)
Primary Care Physician:  [name], MD Primary Gastroenterologist:  Dr.   Pre-Procedure History & Physical: HPI:  [name] is a 66 y.o. male here for   Past Medical History  Diagnosis Date  . Arteriosclerotic cardiovascular disease (ASCVD)     CABG  . Rheumatoid arthritis     with nephritis  . Hyperlipidemia   . Hypertension   . PVD (peripheral vascular disease)     Left iliac PCI/stent  . Cerebrovascular disease   . Tobacco abuse, in remission     Remote  . GERD (gastroesophageal reflux disease)   . Hypothyroid   . Allergic rhinitis   . Diabetes mellitus   . Nephrolithiasis   . Chronic lung disease     ?  Rheumatoid lung; ?  Adverse reaction to methotrexate  . Cervical spondylosis   . Schatzki's ring   . Hx of Clostridium difficile infection   . Achalasia   . Hiatal hernia     Past Surgical History  Procedure Date  . Coronary artery bypass graft     x6  . Total knee arthroplasty     Right  . Ankle fusion     Left  . Wrist fusion     Right  . Shoulder surgery   . Colonoscopy 09/12/2011    Dr. Elly Modena hemorrhoids, normal colon and distal terminal ileum. Random bx negative. Stool for CDiff positive.  . Esophagogastroduodenoscopy 09/13/2011    Dr. Lyndle Herrlich plawues mid-esophagus KOH negative. Distal esophageal ring and ulcer. Mild gastritis. duodenal diverticulum. Savaory dilation 16mm.   Gaspar Bidding dilation 09/13/2011  . Esophageal manometry 09/30/2011    Procedure: ESOPHAGEAL MANOMETRY (EM);  Surgeon: Rob Bunting, MD;  Location: [facility];  Service: Endoscopy;  Laterality: N/A;  . Esophagogastroduodenoscopy 09/12/2011    Dr. Rinaldo Ratel rings s/p dilation  . Cardiac surgery   . Esophagomyotomy 11/12/11    [facility]- with DOR antireflux surgery    Prior to Admission medications   Medication Sig Start Date End Date Taking? Authorizing Provider  acetaminophen (TYLENOL) 500 MG tablet Take 1,000 mg by mouth daily.     Yes Historical Provider,  MD  aspirin EC 81 MG tablet Take 81 mg by mouth daily.     Yes Historical Provider, MD  atenolol (TENORMIN) 25 MG tablet Take 25 mg by mouth daily.     Yes Historical Provider, MD  atorvastatin (LIPITOR) 80 MG tablet Take 1 tablet (80 mg total) by mouth daily. 10/02/11 10/01/12 Yes Kathlen Brunswick, MD  cholecalciferol (VITAMIN D) 400 UNITS TABS Take 400 Units by mouth daily.    Yes Historical Provider, MD  ezetimibe (ZETIA) 10 MG tablet Take 1 tablet (10 mg total) by mouth daily. 12/27/11 12/26/12 Yes Kathlen Brunswick, MD  folic acid (FOLVITE) 1 MG tablet Take 1 mg by mouth daily.     Yes Historical Provider, MD  HYDROcodone-acetaminophen (NORCO) 5-325 MG per tablet Take 1 tablet by mouth 3 (three) times daily.  08/06/11  Yes Historical Provider, MD  ibuprofen (ADVIL,MOTRIN) 800 MG tablet Take 800 mg by mouth 2 (two) times daily.    Yes Historical Provider, MD  leflunomide (ARAVA) 20 MG tablet Take 20 mg by mouth daily.     Yes Historical Provider, MD  levothyroxine (SYNTHROID, LEVOTHROID) 100 MCG tablet Take 88 mcg by mouth daily.    Yes Historical Provider, MD  lisinopril (PRINIVIL,ZESTRIL) 20 MG tablet Take 20 mg by mouth daily.     Yes Historical Provider, MD  Niacin  1000 MG TBCR Take 2,000 mg by mouth daily.     Yes Historical Provider, MD  Omega-3 Fatty Acids (FISH OIL) 1200 MG CAPS Take 1,200 mg by mouth daily.     Yes Historical Provider, MD  omeprazole (PRILOSEC OTC) 20 MG tablet Take 20 mg by mouth 2 (two) times daily.     Yes Historical Provider, MD  predniSONE (DELTASONE) 10 MG tablet Take 10 mg by mouth daily.     Yes Historical Provider, MD  Probiotic Product (ALIGN) 4 MG CAPS Take 4 mg by mouth daily.   Yes Historical Provider, MD  UNABLE TO FIND forteo  20 mcg per dose   Yes Historical Provider, MD  Vitamin D, Ergocalciferol, (DRISDOL) 50000 UNITS CAPS Take 50,000 Units by mouth every 7 (seven) days.   Yes Historical Provider, MD    Allergies as of 05/29/2012 - Review Complete  05/29/2012  Allergen Reaction Noted  . Amoxicillin Hives 08/24/2011  . Doxycycline Nausea And Vomiting 06/05/2011  . Levofloxacin Nausea And Vomiting 03/07/2008    Family History  Problem Relation Age of Onset  . Stomach cancer Mother 74  . Heart attack Father   . Heart disease Brother   . Leukemia Brother   . Lung disease Son     infant  . Lung disease Daughter     infant    History   Social History  . Marital Status: Married    Spouse Name: N/A    Number of Children: 1  . Years of Education: N/A   Occupational History  . retired [employer]    Social History Main Topics  . Smoking status: Former Smoker -- 0.5 packs/day for 5 years    Types: Cigarettes    Quit date: 09/23/1965  . Smokeless tobacco: Not on file  . Alcohol Use: No  . Drug Use: No  . Sexually Active: Not on file   Other Topics Concern  . Not on file   Social History Narrative  . No narrative on file    Review of Systems: See HPI, otherwise negative ROS  Physical Exam: BP 107/58  Pulse 71  Temp 98.1 F (36.7 C) (Temporal)  Ht 5\' 8"  (1.727 m)  Wt 151 lb 6.4 oz (68.675 kg)  BMI 23.02 kg/m2 General:   Alert,  Well-developed, well-nourished, pleasant and cooperative in NAD Skin:  Intact without significant lesions or rashes. Eyes:  Sclera clear, no icterus.   Conjunctiva pink. Ears:  Normal auditory acuity. Nose:  No deformity, discharge,  or lesions. Mouth:  No deformity or lesions. Neck:  Supple; no masses or thyromegaly. No significant cervical adenopathy. Lungs:  Clear throughout to auscultation.   No wheezes, crackles, or rhonchi. No acute distress. Heart:  Regular rate and rhythm; no murmurs, clicks, rubs,  or gallops. Abdomen: Non-distended, normal bowel sounds.  Soft and nontender without appreciable mass or hepatosplenomegaly.  Pulses:  Normal pulses noted. Extremities:  Without clubbing or edema.  Impression/Plan:  ***

## 2012-05-29 NOTE — Patient Instructions (Addendum)
Schedule a barium pill esophagram- evaluate recurrent dysphagia

## 2012-06-01 ENCOUNTER — Ambulatory Visit (HOSPITAL_COMMUNITY)
Admission: RE | Admit: 2012-06-01 | Discharge: 2012-06-01 | Disposition: A | Discharge status: Home / Self Care | Payer: Medicare Other | Source: Ambulatory Visit | Attending: Internal Medicine | Admitting: Internal Medicine

## 2012-06-01 DIAGNOSIS — K224 Dyskinesia of esophagus: Secondary | ICD-10-CM | POA: Insufficient documentation

## 2012-06-01 DIAGNOSIS — R1310 Dysphagia, unspecified: Secondary | ICD-10-CM | POA: Insufficient documentation

## 2012-06-02 ENCOUNTER — Other Ambulatory Visit: Payer: Self-pay | Admitting: Internal Medicine

## 2012-06-02 DIAGNOSIS — R1310 Dysphagia, unspecified: Secondary | ICD-10-CM

## 2012-06-02 NOTE — Progress Notes (Signed)
LMOM for patient to return my call to schedule 

## 2012-06-02 NOTE — Progress Notes (Signed)
Patient is scheduled for EGD w/[facility] on 06/25/12 w/RMR and instructions were mailed and patient is aware

## 2012-06-22 ENCOUNTER — Encounter (HOSPITAL_COMMUNITY): Payer: Self-pay | Admitting: Pharmacy Technician

## 2012-06-25 ENCOUNTER — Encounter (HOSPITAL_COMMUNITY)
Admission: RE | Disposition: A | Discharge status: Home / Self Care | Payer: Self-pay | Source: Ambulatory Visit | Attending: Internal Medicine

## 2012-06-25 ENCOUNTER — Encounter (HOSPITAL_COMMUNITY): Payer: Self-pay | Admitting: *Deleted

## 2012-06-25 ENCOUNTER — Ambulatory Visit (HOSPITAL_COMMUNITY)
Admission: RE | Admit: 2012-06-25 | Discharge: 2012-06-25 | Disposition: A | Discharge status: Home / Self Care | Payer: Medicare Other | Source: Ambulatory Visit | Attending: Internal Medicine | Admitting: Internal Medicine

## 2012-06-25 DIAGNOSIS — K228 Other specified diseases of esophagus: Secondary | ICD-10-CM

## 2012-06-25 DIAGNOSIS — K2990 Gastroduodenitis, unspecified, without bleeding: Secondary | ICD-10-CM

## 2012-06-25 DIAGNOSIS — K2970 Gastritis, unspecified, without bleeding: Secondary | ICD-10-CM

## 2012-06-25 DIAGNOSIS — K449 Diaphragmatic hernia without obstruction or gangrene: Secondary | ICD-10-CM | POA: Insufficient documentation

## 2012-06-25 DIAGNOSIS — K319 Disease of stomach and duodenum, unspecified: Secondary | ICD-10-CM | POA: Insufficient documentation

## 2012-06-25 DIAGNOSIS — K2100 Gastro-esophageal reflux disease with esophagitis, without bleeding: Secondary | ICD-10-CM | POA: Insufficient documentation

## 2012-06-25 DIAGNOSIS — R1310 Dysphagia, unspecified: Secondary | ICD-10-CM

## 2012-06-25 HISTORY — PX: MALONEY DILATION: SHX5535

## 2012-06-25 HISTORY — PX: SAVORY DILATION: SHX5439

## 2012-06-25 HISTORY — PX: ESOPHAGOGASTRODUODENOSCOPY: SHX5428

## 2012-06-25 SURGERY — EGD (ESOPHAGOGASTRODUODENOSCOPY)
Anesthesia: Moderate Sedation

## 2012-06-25 MED ORDER — MEPERIDINE HCL 100 MG/ML IJ SOLN
INTRAMUSCULAR | Status: DC | PRN
  Administered 2012-06-25: 25 mg via INTRAVENOUS
  Administered 2012-06-25: 50 mg via INTRAVENOUS

## 2012-06-25 MED ORDER — MIDAZOLAM HCL 5 MG/5ML IJ SOLN
INTRAMUSCULAR | Status: AC
  Filled 2012-06-25: qty 10

## 2012-06-25 MED ORDER — SODIUM CHLORIDE 0.45 % IV SOLN
INTRAVENOUS | Status: DC
  Administered 2012-06-25: 07:00:00 via INTRAVENOUS

## 2012-06-25 MED ORDER — STERILE WATER FOR IRRIGATION IR SOLN
Status: DC | PRN
  Administered 2012-06-25: 08:00:00

## 2012-06-25 MED ORDER — BUTAMBEN-TETRACAINE-BENZOCAINE 2-2-14 % EX AERO
INHALATION_SPRAY | CUTANEOUS | Status: DC | PRN
  Administered 2012-06-25: 2 via TOPICAL

## 2012-06-25 MED ORDER — MIDAZOLAM HCL 5 MG/5ML IJ SOLN
INTRAMUSCULAR | Status: DC | PRN
  Administered 2012-06-25: 2 mg via INTRAVENOUS
  Administered 2012-06-25: 1 mg via INTRAVENOUS

## 2012-06-25 MED ORDER — MEPERIDINE HCL 100 MG/ML IJ SOLN
INTRAMUSCULAR | Status: AC
  Filled 2012-06-25: qty 2

## 2012-06-25 NOTE — H&P (View-Only) (Signed)
Primary Care Physician:  [name], MD Primary Gastroenterologist:  Dr. Jena Gauss  Pre-Procedure History & Physical: HPI:  [name] is a 66 y.o. male here for achalasia - status post Heller myotomy and Dor. antireflux procedure.  Postoperative course complicated by C. difficile infection. Has done well on omeprazole 20 mg orally twice a day. No dysphagia until the past couple of weeks when he started having recurrent esophageal dysphagia to solids. No polyps postop barium studies or as I can see. Has not had a postoperative EGD. Denies reflux symptoms on his current acid suppression regimen. Has had problems with the fracture his left foot the setting of rheumatoid arthritis. Bowel function now back to normal.  Past Medical History  Diagnosis Date  . Arteriosclerotic cardiovascular disease (ASCVD)     CABG  . Rheumatoid arthritis     with nephritis  . Hyperlipidemia   . Hypertension   . PVD (peripheral vascular disease)     Left iliac PCI/stent  . Cerebrovascular disease   . Tobacco abuse, in remission     Remote  . GERD (gastroesophageal reflux disease)   . Hypothyroid   . Allergic rhinitis   . Diabetes mellitus   . Nephrolithiasis   . Chronic lung disease     ?  Rheumatoid lung; ?  Adverse reaction to methotrexate  . Cervical spondylosis   . Schatzki's ring   . Hx of Clostridium difficile infection   . Achalasia   . Hiatal hernia     Past Surgical History  Procedure Date  . Coronary artery bypass graft     x6  . Total knee arthroplasty     Right  . Ankle fusion     Left  . Wrist fusion     Right  . Shoulder surgery   . Colonoscopy 09/12/2011    Dr. Elly Modena hemorrhoids, normal colon and distal terminal ileum. Random bx negative. Stool for CDiff positive.  . Esophagogastroduodenoscopy 09/13/2011    Dr. Lyndle Herrlich plawues mid-esophagus KOH negative. Distal esophageal ring and ulcer. Mild gastritis. duodenal diverticulum. Savaory dilation 16mm.   Gaspar Bidding  dilation 09/13/2011  . Esophageal manometry 09/30/2011    Procedure: ESOPHAGEAL MANOMETRY (EM);  Surgeon: Rob Bunting, MD;  Location: [facility];  Service: Endoscopy;  Laterality: N/A;  . Esophagogastroduodenoscopy 09/12/2011    Dr. Rinaldo Ratel rings s/p dilation  . Cardiac surgery   . Esophagomyotomy 11/12/11    [facility]- with DOR antireflux surgery    Prior to Admission medications   Medication Sig Start Date End Date Taking? Authorizing Provider  acetaminophen (TYLENOL) 500 MG tablet Take 1,000 mg by mouth daily.     Yes Historical Provider, MD  aspirin EC 81 MG tablet Take 81 mg by mouth daily.     Yes Historical Provider, MD  atenolol (TENORMIN) 25 MG tablet Take 25 mg by mouth daily.     Yes Historical Provider, MD  atorvastatin (LIPITOR) 80 MG tablet Take 1 tablet (80 mg total) by mouth daily. 10/02/11 10/01/12 Yes Kathlen Brunswick, MD  cholecalciferol (VITAMIN D) 400 UNITS TABS Take 400 Units by mouth daily.    Yes Historical Provider, MD  ezetimibe (ZETIA) 10 MG tablet Take 1 tablet (10 mg total) by mouth daily. 12/27/11 12/26/12 Yes Kathlen Brunswick, MD  folic acid (FOLVITE) 1 MG tablet Take 1 mg by mouth daily.     Yes Historical Provider, MD  HYDROcodone-acetaminophen (NORCO) 5-325 MG per tablet Take 1 tablet by mouth 3 (three) times daily.  08/06/11  Yes Historical Provider, MD  ibuprofen (ADVIL,MOTRIN) 800 MG tablet Take 800 mg by mouth 2 (two) times daily.    Yes Historical Provider, MD  leflunomide (ARAVA) 20 MG tablet Take 20 mg by mouth daily.     Yes Historical Provider, MD  levothyroxine (SYNTHROID, LEVOTHROID) 100 MCG tablet Take 88 mcg by mouth daily.    Yes Historical Provider, MD  lisinopril (PRINIVIL,ZESTRIL) 20 MG tablet Take 20 mg by mouth daily.     Yes Historical Provider, MD  Niacin 1000 MG TBCR Take 2,000 mg by mouth daily.     Yes Historical Provider, MD  Omega-3 Fatty Acids (FISH OIL) 1200 MG CAPS Take 1,200 mg by mouth daily.     Yes Historical Provider, MD    omeprazole (PRILOSEC OTC) 20 MG tablet Take 20 mg by mouth 2 (two) times daily.     Yes Historical Provider, MD  predniSONE (DELTASONE) 10 MG tablet Take 10 mg by mouth daily.     Yes Historical Provider, MD  Probiotic Product (ALIGN) 4 MG CAPS Take 4 mg by mouth daily.   Yes Historical Provider, MD  UNABLE TO FIND forteo  20 mcg per dose   Yes Historical Provider, MD  Vitamin D, Ergocalciferol, (DRISDOL) 50000 UNITS CAPS Take 50,000 Units by mouth every 7 (seven) days.   Yes Historical Provider, MD    Allergies as of 05/29/2012 - Review Complete 05/29/2012  Allergen Reaction Noted  . Amoxicillin Hives 08/24/2011  . Doxycycline Nausea And Vomiting 06/05/2011  . Levofloxacin Nausea And Vomiting 03/07/2008    Family History  Problem Relation Age of Onset  . Stomach cancer Mother 18  . Heart attack Father   . Heart disease Brother   . Leukemia Brother   . Lung disease Son     infant  . Lung disease Daughter     infant    History   Social History  . Marital Status: Married    Spouse Name: N/A    Number of Children: 1  . Years of Education: N/A   Occupational History  . retired [employer]    Social History Main Topics  . Smoking status: Former Smoker -- 0.5 packs/day for 5 years    Types: Cigarettes    Quit date: 09/23/1965  . Smokeless tobacco: Not on file  . Alcohol Use: No  . Drug Use: No  . Sexually Active: Not on file   Other Topics Concern  . Not on file   Social History Narrative  . No narrative on file    Review of Systems: See HPI, otherwise negative ROS  Physical Exam: BP 107/58  Pulse 71  Temp 98.1 F (36.7 C) (Temporal)  Ht 5\' 8"  (1.727 m)  Wt 151 lb 6.4 oz (68.675 kg)  BMI 23.02 kg/m2 General:   Alert,  somewhat frail, thin elderly pleasant and cooperative in NAD. Accompanied by his wife Skin:  Intact without significant lesions or rashes. Eyes:  Sclera clear, no icterus.   Conjunctiva pink. Ears:  Normal auditory acuity. Nose:   No deformity, discharge,  or lesions. Mouth:  No deformity or lesions. Neck:  Supple; no masses or thyromegaly. No significant cervical adenopathy. Lungs:  Clear throughout to auscultation.   No wheezes, crackles, or rhonchi. No acute distress. Heart:  Regular rate and rhythm; no murmurs, clicks, rubs,  or gallops. Abdomen: Non-distended, normal bowel sounds.  Soft and nontender without appreciable mass or hepatosplenomegaly.  Pulses:  Normal pulses noted. Extremities:  Without clubbing or edema.  Impression/Plan:  Achalasia. Status post esophagomyotomy and Dor antireflux procedure.  Now with recurrent esophageal dysphagia. No GERD symptoms on twice a day PPI therapy. Discussed the potential for recurrent esophageal dysphagia in the setting and the likelihood of ongoing GERD post-myotomy. Also discussed the increased risk of esophageal cancer in this setting.  Recommendations: Barium pill esophagram to noninvasively evaluate his dysphagia symptoms and size up his anatomy post surgery. I told he and his wife he may well likely need to have an EGD in the near future and even dilation. Will make further recommendations once I have reviewed the BPE.

## 2012-06-25 NOTE — Interval H&P Note (Signed)
History and Physical Interval Note:  06/25/2012 7:40 AM  [name]  has presented today for surgery, with the diagnosis of Dysphagia  The various methods of treatment have been discussed with the patient and family. After consideration of risks, benefits and other options for treatment, the patient has consented to  Procedure(s) (LRB) with comments: ESOPHAGOGASTRODUODENOSCOPY (EGD) (N/A) - 7:30 SAVORY DILATION (N/A) MALONEY DILATION (N/A) as a surgical intervention .  The patient's history has been reviewed, patient examined, no change in status, stable for surgery.  I have reviewed the patient's chart and labs.  Questions were answered to the patient's satisfaction.     [name]  Esophagram demonstrates narrowing distal esophagus above the area of previous stricture. EGD with dilation as appropriate now being performed.The risks, benefits, limitations, alternatives and imponderables have been reviewed with the patient. Potential for esophageal dilation, biopsy, etc. have also been reviewed.  Questions have been answered. All parties agreeable.

## 2012-06-25 NOTE — Op Note (Signed)
[facility] [address] [place], [address]   ENDOSCOPY PROCEDURE REPORT  PATIENT: [name], [name]  MR#: [id number] BIRTHDATE: [birth year] , 66  yrs. old GENDER: Male ENDOSCOPIST: R. Roetta Sessions, MD FACP [facility] R.  Roetta Sessions, MD FACP [facility] REFERRED BY:  Simone Curia, M.D. PROCEDURE DATE:  06/25/2012 PROCEDURE:     EGD with Elease Hashimoto dilation followed by gastric biopsy  INDICATIONS:    recurrent esophageal dysphagia -  status post esophagomyotomy for achalasia. Barium pill esophagram demonstrates obstruction just above the GE junction.  INFORMED CONSENT:   The risks, benefits, limitations, alternatives and imponderables have been discussed.  The potential for biopsy, esophogeal dilation, etc. have also been reviewed.  Questions have been answered.  All parties agreeable.  Please see the history and physical in the medical record for more information.  MEDICATIONS:   Versed 3 mg IV Demerol 75 mg IV in divided doses. Cetacaine spray  DESCRIPTION OF PROCEDURE:   The AO-1308M ([id number])  endoscope was introduced through the mouth and advanced to the second portion of the duodenum without difficulty or limitations.  The mucosal surfaces were surveyed very carefully during advancement of the scope and upon withdrawal.  Retroflexion view of the proximal stomach and esophagogastric junction was performed.      FINDINGS:  Patent appearing tubular esophagus throughout its course. Distal esophageal erosions straddling the GE junction. No obstructing lesion seen. No evidence of tumor. No evidence of Barrett's esophagus. Stomach empty. Small hiatal hernia. Multiple linear antral erosions some with depth that would meet criteria for ulceration. No obvious infiltrating process or tumor seen. Pylorus patent and easily traversed. Examination first and second portion of the duodenum revealed no abnormality.  THERAPEUTIC / DIAGNOSTIC MANEUVERS PERFORMED:  A 56 French  Maloney dilator was passed to full insertion with mild to moderate resistance. A look back revealed a superficial tear at the GE junction with minimal bleeding and without apparent complication otherwise. Subsequently, biopsies abnormal antrum were taken for histologic study   COMPLICATIONS:  None  IMPRESSION:  Distal esophageal erosions consistent with erosive reflux esophagitis. Patent esophagus endoscopically-status post Maloney dilation as described above. Gastric erosions and ulcerations most consistent with NSAID gastropathy-status post biopsy. Small hiatal hernia.  RECOMMENDATIONS:  Continue twice a day Prilosec. Followup on pathology. Office visit with [place] in 6 months.    _______________________________ R. Roetta Sessions, MD FACP [facility] eSigned:  R. Roetta Sessions, MD FACP [facility] 06/25/2012 8:14 AM     CC:  PATIENT NAME:  [name], [name] MR#: [id number]

## 2012-06-26 ENCOUNTER — Encounter: Payer: Self-pay | Admitting: Internal Medicine

## 2012-07-01 ENCOUNTER — Encounter (HOSPITAL_COMMUNITY): Payer: Self-pay | Admitting: Internal Medicine

## 2012-07-02 ENCOUNTER — Encounter: Payer: Self-pay | Admitting: *Deleted

## 2012-07-13 ENCOUNTER — Ambulatory Visit (HOSPITAL_COMMUNITY)
Admission: RE | Admit: 2012-07-13 | Discharge: 2012-07-13 | Disposition: A | Discharge status: Home / Self Care | Payer: Medicare Other | Source: Ambulatory Visit | Attending: Family Medicine | Admitting: Family Medicine

## 2012-07-13 ENCOUNTER — Other Ambulatory Visit: Payer: Self-pay | Admitting: Family Medicine

## 2012-07-13 DIAGNOSIS — Z8781 Personal history of (healed) traumatic fracture: Secondary | ICD-10-CM | POA: Insufficient documentation

## 2012-07-13 DIAGNOSIS — R937 Abnormal findings on diagnostic imaging of other parts of musculoskeletal system: Secondary | ICD-10-CM | POA: Insufficient documentation

## 2012-07-13 DIAGNOSIS — M25579 Pain in unspecified ankle and joints of unspecified foot: Secondary | ICD-10-CM | POA: Insufficient documentation

## 2012-07-13 DIAGNOSIS — M79672 Pain in left foot: Secondary | ICD-10-CM

## 2012-07-29 ENCOUNTER — Encounter: Payer: Self-pay | Admitting: Vascular Surgery

## 2012-08-10 ENCOUNTER — Encounter: Payer: Self-pay | Admitting: *Deleted

## 2012-08-10 ENCOUNTER — Ambulatory Visit (INDEPENDENT_AMBULATORY_CARE_PROVIDER_SITE_OTHER): Payer: Medicare Other | Admitting: Cardiology

## 2012-08-10 ENCOUNTER — Encounter: Payer: Self-pay | Admitting: Cardiology

## 2012-08-10 VITALS — BP 150/90 | HR 87 | Ht 68.0 in | Wt 150.0 lb

## 2012-08-10 DIAGNOSIS — I2510 Atherosclerotic heart disease of native coronary artery without angina pectoris: Secondary | ICD-10-CM

## 2012-08-10 DIAGNOSIS — E039 Hypothyroidism, unspecified: Secondary | ICD-10-CM

## 2012-08-10 DIAGNOSIS — I679 Cerebrovascular disease, unspecified: Secondary | ICD-10-CM

## 2012-08-10 DIAGNOSIS — I10 Essential (primary) hypertension: Secondary | ICD-10-CM

## 2012-08-10 DIAGNOSIS — I739 Peripheral vascular disease, unspecified: Secondary | ICD-10-CM

## 2012-08-10 DIAGNOSIS — E785 Hyperlipidemia, unspecified: Secondary | ICD-10-CM

## 2012-08-10 MED ORDER — ATORVASTATIN CALCIUM 40 MG PO TABS: 40.0000 mg | ORAL_TABLET | Freq: Every day | ORAL | Status: DC

## 2012-08-10 MED ORDER — ATENOLOL 50 MG PO TABS: 50.0000 mg | ORAL_TABLET | Freq: Every day | ORAL | Status: DC

## 2012-08-10 NOTE — Patient Instructions (Addendum)
Your physician recommends that you schedule a follow-up appointment in: 6 months  Your physician has recommended you make the following change in your medication:  1 - STOP Colchicine 2 - START Atorvastatin 40 mg daily 3 - INCREASE Atenolol to 50 mg daily  Your physician recommends that you return for lab work in: 1 month

## 2012-08-10 NOTE — Progress Notes (Deleted)
Name: [name]    DOB: Sep 01, 1946  Age: 66 y.o.  MR#: [id number]       PCP:  [name], MD      Insurance: @PAYORNAME @   CC:   No chief complaint on file.  MEDICATION LIST REVIEWED NEEDS CLEARANCE FOR PINNING AND ROD INSERTION L LEG PER DR WHITFIELD  VS BP 150/90  Pulse 87  Ht 5\' 8"  (1.727 m)  Wt 150 lb (68.04 kg)  BMI 22.81 kg/m2  SpO2 97%  Weights Current Weight  08/10/12 150 lb (68.04 kg)  06/25/12 151 lb (68.493 kg)  06/25/12 151 lb (68.493 kg)    Blood Pressure  BP Readings from Last 3 Encounters:  08/10/12 150/90  06/25/12 132/77  06/25/12 132/77     Admit date:  (Not on file) Last encounter with RMR:  Visit date not found   Allergy Allergies  Allergen Reactions  . Amoxicillin Hives  . Doxycycline Nausea And Vomiting  . Levofloxacin Nausea And Vomiting    Current Outpatient Prescriptions  Medication Sig Dispense Refill  . acetaminophen (TYLENOL) 500 MG tablet Take 1,000 mg by mouth daily.      Marland Kitchen aspirin EC 81 MG tablet Take 81 mg by mouth daily.        Marland Kitchen atenolol (TENORMIN) 25 MG tablet Take 25 mg by mouth daily.        Marland Kitchen COLCRYS 0.6 MG tablet       . diazepam (VALIUM) 2 MG tablet Take 2 mg by mouth at bedtime.      Marland Kitchen ezetimibe (ZETIA) 10 MG tablet Take 1 tablet (10 mg total) by mouth daily.  30 tablet  12  . folic acid (FOLVITE) 1 MG tablet Take 1 mg by mouth daily.        Marland Kitchen ibuprofen (ADVIL,MOTRIN) 800 MG tablet Take 800 mg by mouth 3 (three) times daily.       Marland Kitchen levothyroxine (SYNTHROID, LEVOTHROID) 100 MCG tablet Take 100 mcg by mouth daily.      Marland Kitchen lisinopril (PRINIVIL,ZESTRIL) 20 MG tablet Take 20 mg by mouth daily.        . Omega-3 Fatty Acids (FISH OIL) 500 MG CAPS Take 1 capsule by mouth daily.      Marland Kitchen omeprazole (PRILOSEC OTC) 20 MG tablet Take 20 mg by mouth 2 (two) times daily.        . pravastatin (PRAVACHOL) 80 MG tablet Take 80 mg by mouth daily.      . predniSONE (DELTASONE) 10 MG tablet Take 8 mg by mouth daily.       . Probiotic Product  (ALIGN) 4 MG CAPS Take 4 mg by mouth daily.      . Teriparatide, Recombinant, (FORTEO) 600 MCG/2.4ML SOLN Inject 2.4 mcg into the skin daily.      . Vitamin D, Ergocalciferol, (DRISDOL) 50000 UNITS CAPS Take 50,000 Units by mouth every 7 (seven) days. Mondays      . cholecalciferol (VITAMIN D) 400 UNITS TABS Take 400 Units by mouth daily.         Discontinued Meds:    Medications Discontinued During This Encounter  Medication Reason  . HYDROcodone-acetaminophen (LORTAB) 7.5-500 MG per tablet Duplicate  . HYDROcodone-acetaminophen (NORCO) 7.5-325 MG per tablet Duplicate  . leflunomide (ARAVA) 20 MG tablet Discontinued by provider    Patient Active Problem List  Diagnosis  . HYPOTHYROIDISM  . HYPERLIPIDEMIA  . ATHEROSCLEROTIC CARDIOVASCULAR DISEASE  . PERIPHERAL VASCULAR DISEASE  . GASTROESOPHAGEAL REFLUX DISEASE  . Carotid  artery disease  . Hypertension  . Rheumatoid arthritis  . Tobacco abuse, in remission  . Diabetes mellitus  . Chronic lung disease  . Cervical spondylosis  . Achalasia  . History of Clostridium difficile infection    LABS No visits with results within 3 Month(s) from this visit. Latest known visit with results is:  Orders Only on 12/24/2011  Component Date Value  . Cholesterol 12/24/2011 245*  . Triglycerides 12/24/2011 346*  . HDL 12/24/2011 44   . Total CHOL/HDL Ratio 12/24/2011 5.6   . VLDL 12/24/2011 69*  . LDL Cholesterol 12/24/2011 132*     Results for this Opt Visit:     Results for orders placed in visit on 12/24/11  LIPID PANEL      Component Value Range   Cholesterol 245 (*) 0 - 200 mg/dL   Triglycerides 213 (*) <150 mg/dL   HDL 44  >08 mg/dL   Total CHOL/HDL Ratio 5.6     VLDL 69 (*) 0 - 40 mg/dL   LDL Cholesterol 657 (*) 0 - 99 mg/dL    EKG Orders placed in visit on 08/10/12  . EKG 12-LEAD     Prior Assessment and Plan Problem List as of 08/10/2012            Cardiology Problems   HYPERLIPIDEMIA   Last Assessment &  Plan Note   09/25/2011 Office Visit Signed 09/25/2011 11:56 AM by Kathlen Brunswick, MD    Total cholesterol, LDL and HDL levels were good when last assessed a year ago, but triglycerides were high.  This may have reflected suboptimal control of diabetes.  A repeat lipid profile will be obtained.  Patient previously could not be treated with atorvastatin in the past due to cost, but may be able to afford that medication now that it has a generic equivalent.    ATHEROSCLEROTIC CARDIOVASCULAR DISEASE   Last Assessment & Plan Note   09/25/2011 Office Visit Signed 09/25/2011 11:53 AM by Kathlen Brunswick, MD    The patient has done extraordinarily well with no recurrent problems with coronary disease, now 15 years post CABG surgery.  We will continue optimal management of cardiovascular risk factors.    PERIPHERAL VASCULAR DISEASE   Carotid artery disease   Last Assessment & Plan Note   09/25/2011 Office Visit Addendum 09/29/2011 12:22 PM by Kathlen Brunswick, MD    Cerebrovascular disease is modest.  Repeat carotid ultrasound can be performed in 1-2 years.    Hypertension   Last Assessment & Plan Note   09/25/2011 Office Visit Signed 09/25/2011 11:56 AM by Kathlen Brunswick, MD    Blood pressure control is excellent with current medications, which will be continued.      Other   HYPOTHYROIDISM   Last Assessment & Plan Note   09/25/2011 Office Visit Signed 09/25/2011 11:57 AM by Kathlen Brunswick, MD    TSH was normal in 09/2010 on current replacement therapy.    GASTROESOPHAGEAL REFLUX DISEASE   Last Assessment & Plan Note   12/18/2011 Office Visit Signed 12/18/2011 10:01 AM by Tiffany Kocher, PA    Doing very well on omeprazole 20 mg twice a day. Continue chronically. Office visit when necessary.    Rheumatoid arthritis   Last Assessment & Plan Note   09/25/2011 Office Visit Addendum 09/29/2011 12:24 PM by Kathlen Brunswick, MD    Therapy has been limited by recent GI problems, as patient has been advised not  to use nonsteroidal  medication.  Hopefully, anti-inflammatory medication can be resumed once his GI evaluation and treatment have been completed.    Tobacco abuse, in remission   Diabetes mellitus   Chronic lung disease   Cervical spondylosis   Achalasia   Last Assessment & Plan Note   12/18/2011 Office Visit Signed 12/18/2011 10:00 AM by Tiffany Kocher, PA    Status post surgery as outlined above. He is doing very well. Appetite is improving. He's been able eat soft meats, bread this week. He has one more followup planned with his surgeon. Denies any heartburn.    History of Clostridium difficile infection   Last Assessment & Plan Note   12/18/2011 Office Visit Signed 12/18/2011 10:02 AM by Tiffany Kocher, PA    Avoid unnecessary antibiotics. Recommend probiotics especially with antibiotics and for one month afterwards. He will let us know if he has any alteration in his bowel habits, diarrhea.        Imaging: Dg Foot Complete Left  07/13/2012  *RADIOLOGY REPORT*  Clinical Data: Foot fracture 5 months ago, patient says it will not heal, left foot pain  LEFT FOOT - COMPLETE 3+ VIEW  Comparison: None  Findings: Diffuse osseous demineralization. Cystic degenerative changes at fourth metatarsal head. Significant degenerative changes at the ankle and hind foot, particularly the tibiotalar joint and subtalar joints. Small plantar calcaneal spur. Diffuse soft tissue swelling. Callus identified at a displaced and angulated fracture of the distal fibula with fracture line still evident. Callus is also seen at a healing fracture of the distal tibial diaphysis. No definite acute fracture or dislocation.  IMPRESSION: Healing fractures of the distal tibia and fibula as above. Extensive degenerative changes at the tibiotalar and subtalar joints. Cystic degenerative change fourth metatarsal head. Soft tissue swelling without definite acute bony abnormality.   Original Report Authenticated By: Lollie Marrow, M.D.       [facility] Calculation: Score not calculated. Missing: Total Cholesterol

## 2012-08-10 NOTE — Assessment & Plan Note (Signed)
Blood pressure control is slightly suboptimal.  Atenolol dosage will be increased.

## 2012-08-10 NOTE — Assessment & Plan Note (Signed)
Peripheral vascular disease has been stable without definite symptoms of claudication.  No intervention is warranted at present.

## 2012-08-10 NOTE — Progress Notes (Signed)
Patient ID: [name], male   DOB: 1946-05-08, 66 y.o.   MRN: [id number]  HPI: Scheduled return visit for this very nice gentleman with coronary artery disease and rheumatoid arthritis.  He has had no dyspnea nor chest discomfort, but did suffer a spontaneous fracture of the left lower leg for which he has been immobilized for the past 5 months, first in a cast and more recently in a pneumatic boot.  He is now partial weightbearing with crutches, but fractures apparently not healing well, and surgical intervention is anticipated.  Prior to Admission medications   Medication Sig Start Date End Date Taking? Authorizing Provider  acetaminophen (TYLENOL) 500 MG tablet Take 1,000 mg by mouth daily.   Yes Historical Provider, MD  aspirin EC 81 MG tablet Take 81 mg by mouth daily.     Yes Historical Provider, MD  atenolol (TENORMIN) 50 MG tablet Take 1 tablet (50 mg total) by mouth daily. 08/10/12  Yes Kathlen Brunswick, MD  diazepam (VALIUM) 2 MG tablet Take 2 mg by mouth at bedtime.   Yes Historical Provider, MD  ezetimibe (ZETIA) 10 MG tablet Take 1 tablet (10 mg total) by mouth daily. 12/27/11 12/26/12 Yes Kathlen Brunswick, MD  folic acid (FOLVITE) 1 MG tablet Take 1 mg by mouth daily.     Yes Historical Provider, MD  ibuprofen (ADVIL,MOTRIN) 800 MG tablet Take 800 mg by mouth 3 (three) times daily.    Yes Historical Provider, MD  levothyroxine (SYNTHROID, LEVOTHROID) 100 MCG tablet Take 100 mcg by mouth daily.   Yes Historical Provider, MD  lisinopril (PRINIVIL,ZESTRIL) 20 MG tablet Take 20 mg by mouth daily.     Yes Historical Provider, MD  Omega-3 Fatty Acids (FISH OIL) 500 MG CAPS Take 1 capsule by mouth daily.   Yes Historical Provider, MD  omeprazole (PRILOSEC OTC) 20 MG tablet Take 20 mg by mouth 2 (two) times daily.     Yes Historical Provider, MD  predniSONE (DELTASONE) 10 MG tablet Take 8 mg by mouth daily.    Yes Historical Provider, MD  Probiotic Product (ALIGN) 4 MG CAPS Take 4 mg by mouth  daily.   Yes Historical Provider, MD  Teriparatide, Recombinant, (FORTEO) 600 MCG/2.4ML SOLN Inject 2.4 mcg into the skin daily.   Yes Historical Provider, MD  Vitamin D, Ergocalciferol, (DRISDOL) 50000 UNITS CAPS Take 50,000 Units by mouth every 7 (seven) days. Mondays   Yes Historical Provider, MD  atorvastatin (LIPITOR) 40 MG tablet Take 1 tablet (40 mg total) by mouth daily. 08/10/12   Kathlen Brunswick, MD  cholecalciferol (VITAMIN D) 400 UNITS TABS Take 400 Units by mouth daily.     Historical Provider, MD   Allergies  Allergen Reactions  . Amoxicillin Hives  . Doxycycline Nausea And Vomiting  . Levofloxacin Nausea And Vomiting  Past medical history, social history, and family history reviewed and updated.  ROS: Denies orthopnea, PND, palpitations or syncope.  He has occasional orthostatic lightheadedness that passes quickly.  All other systems reviewed and are negative.  PHYSICAL EXAM: BP 150/90  Pulse 87  Ht 5\' 8"  (1.727 m)  Wt 68.04 kg (150 lb)  BMI 22.81 kg/m2  SpO2 97% ; no significant orthostatic change in blood pressure General-Well developed; no acute distress Body habitus-proportionate weight and height Neck-No JVD; Minimal right carotid bruit Lungs-clear lung fields; resonant to percussion Cardiovascular-normal PMI; normal S1 and S2 Abdomen-normal bowel sounds; soft and non-tender without masses or organomegaly Musculoskeletal-Bilateral wrist and ankle deformities;  surgical scars over the wrist; no cyanosis or clubbing Neurologic-Normal cranial nerves; symmetric strength and tone Skin-Warm, no significant lesions Extremities-distal pulses intact; trace edema of the right ankle; left not examined due to presence of boot  EKG:  Abnormal sinus rhythm with PACs; left atrial abnormality; probable prior anteroseptal MI; ST-T wave abnormality consistent with lateral ischemia or LVH; nondiagnostic in her Q wave.  ASSESSMENT AND PLAN:  [place] Bing, MD 08/10/2012 4:12  PM   his mood is good vocal

## 2012-08-10 NOTE — Assessment & Plan Note (Addendum)
Very stable course since CABG surgery 15 years ago.  In the absence of symptoms, and considering the relatively low physiologic stress represented by the planned orthopaedic surgical procedure, perioperative risk of an acute ischemic event is low.  Dose of beta blockers will be increased in light of patient's mild hypertension and tachycardia.  Dr. Cleophas Dunker can proceed with ORIF surgery as required to assist with fracture healing.  [facility] will be available in hospital if needed.

## 2012-08-10 NOTE — Assessment & Plan Note (Signed)
Atherosclerotic disease was present more than 2 years ago.  A repeat carotid ultrasound will be undertaken.  If results are stable, imaging every 2-3 years should be adequate.

## 2012-08-10 NOTE — Assessment & Plan Note (Signed)
Concomitant use of colchicine and most statins is relatively contraindicated.  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  Colchicine will be discontinued and atorvastatin started a dose of 40 mg per day.

## 2012-08-11 ENCOUNTER — Encounter: Payer: Self-pay | Admitting: *Deleted

## 2012-08-11 ENCOUNTER — Telehealth: Payer: Self-pay | Admitting: Cardiology

## 2012-08-11 NOTE — Telephone Encounter (Signed)
Patient states that Dr.Whitfield's office does not have clearance for his surgery yet. / tg

## 2012-08-11 NOTE — Telephone Encounter (Signed)
Letter and office note faxed this am.  Will re fax.  Patient aware.

## 2012-08-18 ENCOUNTER — Ambulatory Visit: Payer: Medicare Other | Admitting: Cardiology

## 2012-08-26 ENCOUNTER — Encounter (HOSPITAL_COMMUNITY): Payer: Self-pay | Admitting: Pharmacy Technician

## 2012-08-28 ENCOUNTER — Encounter (HOSPITAL_COMMUNITY): Payer: Self-pay

## 2012-08-28 ENCOUNTER — Encounter (HOSPITAL_COMMUNITY)
Admission: RE | Admit: 2012-08-28 | Discharge: 2012-08-28 | Disposition: A | Discharge status: Home / Self Care | Payer: Medicare Other | Source: Ambulatory Visit | Attending: Orthopedic Surgery | Admitting: Orthopedic Surgery

## 2012-08-28 ENCOUNTER — Encounter (HOSPITAL_COMMUNITY)
Admission: RE | Admit: 2012-08-28 | Discharge: 2012-08-28 | Disposition: A | Discharge status: Home / Self Care | Payer: Medicare Other | Source: Ambulatory Visit | Attending: Orthopaedic Surgery | Admitting: Orthopaedic Surgery

## 2012-08-28 HISTORY — DX: Pneumonia, unspecified organism: J18.9

## 2012-08-28 LAB — COMPREHENSIVE METABOLIC PANEL
AST: 35 U/L (ref 0–37)
Albumin: 3.6 g/dL (ref 3.5–5.2)
BUN: 19 mg/dL (ref 6–23)
Chloride: 105 mEq/L (ref 96–112)
Creatinine, Ser: 1.08 mg/dL (ref 0.50–1.35)
Total Bilirubin: 0.3 mg/dL (ref 0.3–1.2)
Total Protein: 7 g/dL (ref 6.0–8.3)

## 2012-08-28 LAB — URINE MICROSCOPIC-ADD ON

## 2012-08-28 LAB — URINALYSIS, ROUTINE W REFLEX MICROSCOPIC
Bilirubin Urine: NEGATIVE
Leukocytes, UA: NEGATIVE
Nitrite: NEGATIVE
Specific Gravity, Urine: 1.027 (ref 1.005–1.030)
Urobilinogen, UA: 0.2 mg/dL (ref 0.0–1.0)
pH: 6 (ref 5.0–8.0)

## 2012-08-28 LAB — CBC
MCHC: 33 g/dL (ref 30.0–36.0)
MCV: 92.6 fL (ref 78.0–100.0)
Platelets: 205 10*3/uL (ref 150–400)
RDW: 14.6 % (ref 11.5–15.5)
WBC: 12.4 10*3/uL — ABNORMAL HIGH (ref 4.0–10.5)

## 2012-08-28 LAB — PROTIME-INR
INR: 0.91 (ref 0.00–1.49)
Prothrombin Time: 12.2 seconds (ref 11.6–15.2)

## 2012-08-28 LAB — SURGICAL PCR SCREEN: MRSA, PCR: NEGATIVE

## 2012-08-28 LAB — APTT: aPTT: 25 seconds (ref 24–37)

## 2012-08-28 NOTE — Pre-Procedure Instructions (Addendum)
20 Mavin D Fjelstad  08/28/2012   Your procedure is scheduled on:  09/01/12  Report to [facility] at 530 AM.  Call this number if you have problems the morning of surgery: [phone]   Remember:   Do not eat food or drink:After Midnight.    Take these medicines the morning of surgery with A SIP OF WATER prednisone,pain med, omeprazole levothyroxine  STOP omega 3, ibuprofen    Do not wear jewelry,  Do not wear lotions, powders, or perfumes. You may not wear deodorant.  Do not shave 48 hours prior to surgery. Men may shave face and neck.  Do not bring valuables to the hospital.  Contacts, dentures or bridgework may not be worn into surgery.  Leave suitcase in the car. After surgery it may be brought to your room.  For patients admitted to the hospital, checkout time is 11:00 AM the day of discharge.   Patients discharged the day of surgery will not be allowed to drive home.  Name and phone number of your driver: hazel [phone]  Special Instructions: Incentive Spirometry - Practice and bring it with you on the day of surgery. Shower using CHG 2 nights before surgery and the night before surgery.  If you shower the day of surgery use CHG.  Use special wash - you have one bottle of CHG for all showers.  You should use approximately 1/3 of the bottle for each shower.   Please read over the following fact sheets that you were given: Pain Booklet, Coughing and Deep Breathing, MRSA Information and Surgical Site Infection Prevention

## 2012-08-28 NOTE — Progress Notes (Signed)
Echo 1/12, stress 9/12 ,ekg 11/13, note from dr [name] 11/13

## 2012-08-29 LAB — URINE CULTURE

## 2012-08-31 MED ORDER — VANCOMYCIN HCL IN DEXTROSE 1-5 GM/200ML-% IV SOLN
1000.0000 mg | INTRAVENOUS | Status: AC
  Administered 2012-09-01: 1000 mg via INTRAVENOUS
  Filled 2012-08-31: qty 200

## 2012-08-31 NOTE — H&P (Signed)
Fernando Campbell, MD   Fernando Code, PA-C [address], [place], [place]  [address]                             [phone]   ORTHOPAEDIC HISTORY & PHYSICAL  [name] MRN:  [id number] DOB/SEX:  04-21-1946/male  CHIEF COMPLAINT:  Painful left lower leg pain  HISTORY: Patient is a 66 y.o. male presented with a history of pain in the left leg for 7 months. Onset of symptoms was gradual starting 7 months ago with gradually worsening pain since that time.  As a point of review he is going on about 7 months post insufficiency fractures of the distal tibia and fibula and, with time, he has had increasing varus position of the fracture.  We have tried casting.  We have tried immobilization in an Futures trader.  We have tried electrical stimulation.  He does have rheumatoid arthritis.  He is immunosuppressed.  He was on [place] but has not been taking it now for the past 3-4 months and just trying to get along with ibuprofen.  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  He has had a prior ankle fusion years ago and so he really does not have much motion. When I put him in a cast to manipulate the fracture the angulation is probably 10 degrees and perfectly acceptable.  But the last time weI had him in a cast he developed excruciating pain in the dorsum of the left foot and I though maybe there was some compression of the foot from the cast.  The cast was removed and within 24 hours the pain subsided.  He has been in an Futures trader.  He certainly has arthritic changes.  He has had x-rays of the foot in the past.  Dr. Gerda Diss repeated the films and it just shows some degenerative changes consistent with his rheumatoid arthritis.  So [name] just has had a very difficult time with all of this.  He continues to have pain and instability at the fracture site. He is admitted today for IM nail left foot and tibia.   PAST MEDICAL HISTORY: Patient Active Problem List   Diagnosis Date Noted  . Achalasia 12/18/2011  . History of Clostridium difficile infection 12/18/2011  . Rheumatoid arthritis   . Tobacco abuse, in remission   . Diabetes mellitus   . Chronic lung disease   . Cervical spondylosis   . Cerebrovascular disease 06/05/2011  . Hypertension 06/05/2011  . HYPOTHYROIDISM 03/18/2010  . ATHEROSCLEROTIC CARDIOVASCULAR DISEASE 03/18/2010  . Peripheral vascular disease 03/18/2010  . GASTROESOPHAGEAL REFLUX DISEASE 03/18/2010  . Hyperlipidemia 03/07/2008   Past Medical History  Diagnosis Date  . 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 Rheumatoid arthritis     with nephritis  . Hyperlipidemia   . Hypertension   . PVD (peripheral vascular disease)     Left iliac PCI/stent  . Cerebrovascular disease   . Tobacco abuse, in remission     Remote  . GERD (gastroesophageal reflux disease)   . Hypothyroid   . Allergic rhinitis   . Chronic lung disease     ?  Rheumatoid lung; ?  Adverse reaction to methotrexate  . Cervical spondylosis   . Schatzki's ring   . Hx of Clostridium difficile infection   . Achalasia   . Hiatal hernia   . Coronary  artery disease   . Anginal pain     occ  . Heart murmur   . Shortness of breath     occ  . Pneumonia     recent pneumonia  . Diabetes mellitus     borderline no meds  . Stones in the urinary tract    Past Surgical History  Procedure Date  . Coronary artery bypass graft     x6 In 1998  . Total knee arthroplasty     Right  . Ankle fusion     Left  . Wrist fusion     Right  . Shoulder surgery   . Colonoscopy 09/12/2011    Dr. Elly Modena hemorrhoids, normal colon and distal terminal ileum. Random bx negative. Stool for CDiff positive.  . Esophagogastroduodenoscopy 09/13/2011    Dr. Lyndle Herrlich plawues mid-esophagus KOH negative. Distal esophageal ring and ulcer. Mild gastritis. duodenal diverticulum. Savaory dilation 16mm.   Gaspar Bidding dilation 09/13/2011  .  Esophageal manometry 09/30/2011    Procedure: ESOPHAGEAL MANOMETRY (EM);  Surgeon: Rob Bunting, MD;  Location: [facility];  Service: Endoscopy;  Laterality: N/A;  . Esophagogastroduodenoscopy 09/12/2011    Dr. Rinaldo Ratel rings s/p dilation  . Cardiac surgery   . Esophagomyotomy 11/12/11    [facility]- with DOR antireflux surgery  . Esophagogastroduodenoscopy 06/25/2012    Procedure: ESOPHAGOGASTRODUODENOSCOPY (EGD);  Surgeon: Corbin Ade, MD;  Location: [facility];  Service: Endoscopy;  Laterality: N/A;  7:30  . Savory dilation 06/25/2012    Procedure: SAVORY DILATION;  Surgeon: Corbin Ade, MD;  Location: [facility];  Service: Endoscopy;  Laterality: N/A;  Elease Hashimoto dilation 06/25/2012    Procedure: Elease Hashimoto DILATION;  Surgeon: Corbin Ade, MD;  Location: [facility];  Service: Endoscopy;  Laterality: N/A;     MEDICATIONS:   Prescriptions prior to admission  Medication Sig Dispense Refill  . acetaminophen (TYLENOL) 500 MG tablet Take 1,000 mg by mouth every morning.       Marland Kitchen aspirin EC 81 MG tablet Take 81 mg by mouth daily.        Marland Kitchen atenolol (TENORMIN) 50 MG tablet Take 1 tablet (50 mg total) by mouth daily.  30 tablet  6  . cholecalciferol (VITAMIN D) 400 UNITS TABS Take 400 Units by mouth daily.       . colchicine 0.6 MG tablet Take 0.6 mg by mouth daily.      . folic acid (FOLVITE) 1 MG tablet Take 1 mg by mouth daily.        Marland Kitchen HYDROcodone-acetaminophen (NORCO/VICODIN) 5-325 MG per tablet Take 1 tablet by mouth 3 (three) times daily.       Marland Kitchen ibuprofen (ADVIL,MOTRIN) 800 MG tablet Take 800 mg by mouth 3 (three) times daily.       Marland Kitchen levothyroxine (SYNTHROID, LEVOTHROID) 100 MCG tablet Take 100 mcg by mouth daily.      Marland Kitchen lisinopril (PRINIVIL,ZESTRIL) 20 MG tablet Take 20 mg by mouth daily.        . Omega-3 Fatty Acids (FISH OIL) 500 MG CAPS Take 1 capsule by mouth daily.      . Omeprazole (PRILOSEC PO) Take 1 tablet by mouth 2 (two) times daily.      . pravastatin  (PRAVACHOL) 80 MG tablet Take 80 mg by mouth daily.      . predniSONE (DELTASONE) 1 MG tablet Take 2 mg by mouth daily. Along with 5mg =8mg       . predniSONE (DELTASONE) 5 MG tablet Take 5  mg by mouth daily. Along with 3mg =8mg       . Probiotic Product (ALIGN) 4 MG CAPS Take 4 mg by mouth 2 (two) times a week.       . Teriparatide, Recombinant, (FORTEO) 600 MCG/2.4ML SOLN Inject 2.4 mcg into the skin daily.      . Vitamin D, Ergocalciferol, (DRISDOL) 50000 UNITS CAPS Take 50,000 Units by mouth every 7 (seven) days. Mondays      . leflunomide (ARAVA) 20 MG tablet Take 20 mg by mouth daily.        ALLERGIES:   Allergies  Allergen Reactions  . Amoxicillin Hives  . Doxycycline Nausea And Vomiting  . Levofloxacin Nausea And Vomiting    REVIEW OF SYSTEMS:  A comprehensive review of systems was negative except for: Cardiovascular: positive for heart disease and ascvd with surgery Gastrointestinal: positive for reflux symptoms Endocrine: positive for diabetes and hypothyroidism.   FAMILY HISTORY:   Family History  Problem Relation Age of Onset  . Stomach cancer Mother 71  . Heart attack Father   . Heart disease Brother   . Leukemia Brother   . Lung disease Son     infant  . Lung disease Daughter     infant    SOCIAL HISTORY:   History  Substance Use Topics  . Smoking status: Former Smoker -- 0.5 packs/day for 5 years    Types: Cigarettes    Quit date: 09/23/1965  . Smokeless tobacco: Not on file  . Alcohol Use: No      EXAMINATION: Vital signs in last 24 hours: Temp:  [98.2 F (36.8 C)] 98.2 F (36.8 C) (12/10 0636) Pulse Rate:  [63] 63  (12/10 0636) Resp:  [18] 18  (12/10 0636) BP: (146)/(77) 146/77 mmHg (12/10 0636) SpO2:  [97 %] 97 % (12/10 0636)  Head is normocephalic.   Eyes:  Pupils equal, round and reactive to light and accommodation.  Extraocular intact. ENT: Ears, nose, and throat were benign.   Neck: supple, no bruits were noted.   Chest: good expansion.     Lungs: essentially clear.   Cardiac: regular rhythm and rate, normal S1, S2.  No murmurs appreciated. Pulses :  trace bilateral and symmetric in lower extremities. Abdomen is scaphoid, soft, nontender, no masses palpable, normal bowel sounds                  present. CNS:  He is oriented x3 and cranial nerves II-XII grossly intact. Breast, rectal, and genital exams: not performed and not indicated for an orthopedic evaluation. Musculoskeletal: measured about 22 or 23 degrees of varus at the fracture site.  There is some motion at the fracture site with pain with maninulation.    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 Rheumatoid arthritis     with nephritis  . Hyperlipidemia   . Hypertension   . PVD (peripheral vascular disease)     Left iliac PCI/stent  . Cerebrovascular disease   . Tobacco abuse, in remission     Remote  . GERD (gastroesophageal reflux disease)   . Hypothyroid   . Allergic rhinitis   . Chronic lung disease     ?  Rheumatoid lung; ?  Adverse reaction to methotrexate  . Cervical spondylosis   . Schatzki's ring   . Hx of Clostridium difficile infection   . Achalasia   .  Hiatal hernia   . Coronary artery disease   . Anginal pain     occ  . Heart murmur   . Shortness of breath     occ  . Pneumonia     recent pneumonia  . Diabetes mellitus     borderline no meds  . Stones in the urinary tract     PLAN: Plan for left IM nailing of the calcaneus through tibia.  The procedure,  risks, and benefits of total knee arthroplasty were presented and reviewed. The risks including but not limited to infection, blood clots, vascular and nerve injury, stiffness,  among others were discussed. The patient acknowledged the explanation, agreed to proceed.   Tracye Szuch 09/01/2012, 7:28 AM

## 2012-09-01 ENCOUNTER — Ambulatory Visit (HOSPITAL_COMMUNITY): Payer: Medicare Other | Admitting: Certified Registered Nurse Anesthetist

## 2012-09-01 ENCOUNTER — Inpatient Hospital Stay (HOSPITAL_COMMUNITY)
Admission: RE | Admit: 2012-09-01 | Discharge: 2012-09-04 | DRG: 493 | Disposition: A | Discharge status: Home Health | Payer: Medicare Other | Source: Ambulatory Visit | Attending: Orthopaedic Surgery | Admitting: Orthopaedic Surgery

## 2012-09-01 ENCOUNTER — Encounter (HOSPITAL_COMMUNITY): Payer: Self-pay | Admitting: Certified Registered Nurse Anesthetist

## 2012-09-01 ENCOUNTER — Ambulatory Visit (HOSPITAL_COMMUNITY): Payer: Medicare Other

## 2012-09-01 ENCOUNTER — Encounter (HOSPITAL_COMMUNITY)
Admission: RE | Disposition: A | Discharge status: Home Health | Payer: Self-pay | Source: Ambulatory Visit | Attending: Orthopaedic Surgery

## 2012-09-01 DIAGNOSIS — I25810 Atherosclerosis of coronary artery bypass graft(s) without angina pectoris: Secondary | ICD-10-CM | POA: Diagnosis present

## 2012-09-01 DIAGNOSIS — E785 Hyperlipidemia, unspecified: Secondary | ICD-10-CM | POA: Diagnosis present

## 2012-09-01 DIAGNOSIS — I10 Essential (primary) hypertension: Secondary | ICD-10-CM | POA: Diagnosis present

## 2012-09-01 DIAGNOSIS — E039 Hypothyroidism, unspecified: Secondary | ICD-10-CM | POA: Diagnosis present

## 2012-09-01 DIAGNOSIS — M069 Rheumatoid arthritis, unspecified: Secondary | ICD-10-CM | POA: Diagnosis present

## 2012-09-01 DIAGNOSIS — I2510 Atherosclerotic heart disease of native coronary artery without angina pectoris: Secondary | ICD-10-CM | POA: Diagnosis present

## 2012-09-01 DIAGNOSIS — I209 Angina pectoris, unspecified: Secondary | ICD-10-CM | POA: Diagnosis present

## 2012-09-01 DIAGNOSIS — Z96659 Presence of unspecified artificial knee joint: Secondary | ICD-10-CM

## 2012-09-01 DIAGNOSIS — R9431 Abnormal electrocardiogram [ECG] [EKG]: Secondary | ICD-10-CM

## 2012-09-01 DIAGNOSIS — Z7982 Long term (current) use of aspirin: Secondary | ICD-10-CM

## 2012-09-01 DIAGNOSIS — K219 Gastro-esophageal reflux disease without esophagitis: Secondary | ICD-10-CM | POA: Diagnosis present

## 2012-09-01 DIAGNOSIS — S82222K Displaced transverse fracture of shaft of left tibia, subsequent encounter for closed fracture with nonunion: Secondary | ICD-10-CM | POA: Diagnosis present

## 2012-09-01 DIAGNOSIS — E119 Type 2 diabetes mellitus without complications: Secondary | ICD-10-CM | POA: Diagnosis present

## 2012-09-01 DIAGNOSIS — Z01818 Encounter for other preprocedural examination: Secondary | ICD-10-CM

## 2012-09-01 DIAGNOSIS — I739 Peripheral vascular disease, unspecified: Secondary | ICD-10-CM | POA: Diagnosis present

## 2012-09-01 DIAGNOSIS — IMO0002 Reserved for concepts with insufficient information to code with codable children: Secondary | ICD-10-CM

## 2012-09-01 DIAGNOSIS — Z79899 Other long term (current) drug therapy: Secondary | ICD-10-CM

## 2012-09-01 DIAGNOSIS — Z87891 Personal history of nicotine dependence: Secondary | ICD-10-CM

## 2012-09-01 DIAGNOSIS — E876 Hypokalemia: Secondary | ICD-10-CM | POA: Diagnosis not present

## 2012-09-01 DIAGNOSIS — Z01812 Encounter for preprocedural laboratory examination: Secondary | ICD-10-CM

## 2012-09-01 HISTORY — PX: ANKLE FUSION: SHX5718

## 2012-09-01 LAB — GLUCOSE, CAPILLARY
Glucose-Capillary: 134 mg/dL — ABNORMAL HIGH (ref 70–99)
Glucose-Capillary: 88 mg/dL (ref 70–99)

## 2012-09-01 SURGERY — ANKLE FUSION
Anesthesia: General | Site: Ankle | Laterality: Left | Wound class: Clean

## 2012-09-01 MED ORDER — HYDROCORTISONE SOD SUCCINATE 100 MG IJ SOLR
100.0000 mg | Freq: Every day | INTRAMUSCULAR | Status: AC
  Administered 2012-09-01: 100 mg via INTRAVENOUS
  Filled 2012-09-01: qty 2

## 2012-09-01 MED ORDER — BISACODYL 10 MG RE SUPP: 10.0000 mg | Freq: Every day | RECTAL | Status: DC | PRN

## 2012-09-01 MED ORDER — ACETAMINOPHEN 10 MG/ML IV SOLN
INTRAVENOUS | Status: AC
  Filled 2012-09-01: qty 100

## 2012-09-01 MED ORDER — MAGNESIUM HYDROXIDE 400 MG/5ML PO SUSP: 30.0000 mL | Freq: Every day | ORAL | Status: DC | PRN

## 2012-09-01 MED ORDER — LIDOCAINE HCL (CARDIAC) 20 MG/ML IV SOLN
INTRAVENOUS | Status: DC | PRN
  Administered 2012-09-01: 60 mg via INTRAVENOUS

## 2012-09-01 MED ORDER — MIDAZOLAM HCL 5 MG/5ML IJ SOLN
INTRAMUSCULAR | Status: DC | PRN
  Administered 2012-09-01: 2 mg via INTRAVENOUS

## 2012-09-01 MED ORDER — PREDNISONE 5 MG PO TABS
8.0000 mg | ORAL_TABLET | Freq: Every day | ORAL | Status: DC
  Administered 2012-09-02 – 2012-09-04 (×3): 8 mg via ORAL
  Filled 2012-09-01 (×4): qty 3

## 2012-09-01 MED ORDER — ASPIRIN EC 81 MG PO TBEC
81.0000 mg | DELAYED_RELEASE_TABLET | Freq: Every day | ORAL | Status: DC
  Administered 2012-09-02 – 2012-09-04 (×3): 81 mg via ORAL
  Filled 2012-09-01 (×4): qty 1

## 2012-09-01 MED ORDER — HYDROMORPHONE HCL PF 1 MG/ML IJ SOLN: 0.2500 mg | INTRAMUSCULAR | Status: DC | PRN

## 2012-09-01 MED ORDER — OXYCODONE HCL 5 MG PO TABS
ORAL_TABLET | ORAL | Status: AC
  Filled 2012-09-01: qty 2

## 2012-09-01 MED ORDER — PREDNISONE 5 MG PO TABS: 5.0000 mg | ORAL_TABLET | Freq: Every day | ORAL | Status: DC

## 2012-09-01 MED ORDER — COLCHICINE 0.6 MG PO TABS
0.6000 mg | ORAL_TABLET | Freq: Every day | ORAL | Status: DC
  Administered 2012-09-02 – 2012-09-04 (×3): 0.6 mg via ORAL
  Filled 2012-09-01 (×3): qty 1

## 2012-09-01 MED ORDER — ONDANSETRON HCL 4 MG/2ML IJ SOLN
INTRAMUSCULAR | Status: DC | PRN
  Administered 2012-09-01: 4 mg via INTRAVENOUS

## 2012-09-01 MED ORDER — DOCUSATE SODIUM 100 MG PO CAPS
100.0000 mg | ORAL_CAPSULE | Freq: Two times a day (BID) | ORAL | Status: DC
  Administered 2012-09-01 – 2012-09-04 (×6): 100 mg via ORAL
  Filled 2012-09-01 (×8): qty 1

## 2012-09-01 MED ORDER — SIMVASTATIN 40 MG PO TABS: 40.0000 mg | ORAL_TABLET | Freq: Every day | ORAL | Status: DC

## 2012-09-01 MED ORDER — VITAMIN D (ERGOCALCIFEROL) 1.25 MG (50000 UNIT) PO CAPS: 50000.0000 [IU] | ORAL_CAPSULE | ORAL | Status: DC

## 2012-09-01 MED ORDER — HYDROMORPHONE HCL PF 1 MG/ML IJ SOLN
0.5000 mg | INTRAMUSCULAR | Status: DC | PRN
  Administered 2012-09-01: 1 mg via INTRAVENOUS
  Administered 2012-09-01: 0.5 mg via INTRAVENOUS
  Administered 2012-09-02: 1 mg via INTRAVENOUS
  Filled 2012-09-01 (×3): qty 1

## 2012-09-01 MED ORDER — SODIUM CHLORIDE 0.9 % IV SOLN: INTRAVENOUS | Status: DC

## 2012-09-01 MED ORDER — BUPIVACAINE HCL (PF) 0.25 % IJ SOLN
INTRAMUSCULAR | Status: AC
  Filled 2012-09-01: qty 30

## 2012-09-01 MED ORDER — ONDANSETRON HCL 4 MG/2ML IJ SOLN
4.0000 mg | Freq: Four times a day (QID) | INTRAMUSCULAR | Status: DC | PRN
  Administered 2012-09-01: 4 mg via INTRAVENOUS
  Filled 2012-09-01: qty 2

## 2012-09-01 MED ORDER — HYDROMORPHONE HCL PF 1 MG/ML IJ SOLN
0.2500 mg | INTRAMUSCULAR | Status: DC | PRN
  Administered 2012-09-01 (×4): 0.5 mg via INTRAVENOUS

## 2012-09-01 MED ORDER — CHLORHEXIDINE GLUCONATE 4 % EX LIQD
60.0000 mL | Freq: Once | CUTANEOUS | Status: DC
Start: 2012-09-01 — End: 2012-09-01

## 2012-09-01 MED ORDER — BUPIVACAINE HCL (PF) 0.25 % IJ SOLN
INTRAMUSCULAR | Status: DC | PRN
  Administered 2012-09-01: 6 mL

## 2012-09-01 MED ORDER — PANTOPRAZOLE SODIUM 40 MG PO TBEC
40.0000 mg | DELAYED_RELEASE_TABLET | Freq: Every day | ORAL | Status: DC
  Administered 2012-09-02 – 2012-09-04 (×3): 40 mg via ORAL
  Filled 2012-09-01 (×2): qty 1

## 2012-09-01 MED ORDER — 0.9 % SODIUM CHLORIDE (POUR BTL) OPTIME
TOPICAL | Status: DC | PRN
  Administered 2012-09-01: 1000 mL

## 2012-09-01 MED ORDER — FENTANYL CITRATE 0.05 MG/ML IJ SOLN
INTRAMUSCULAR | Status: DC | PRN
  Administered 2012-09-01: 25 ug via INTRAVENOUS
  Administered 2012-09-01 (×2): 100 ug via INTRAVENOUS
  Administered 2012-09-01 (×2): 25 ug via INTRAVENOUS
  Administered 2012-09-01: 100 ug via INTRAVENOUS
  Administered 2012-09-01: 25 ug via INTRAVENOUS
  Administered 2012-09-01: 50 ug via INTRAVENOUS
  Administered 2012-09-01 (×2): 25 ug via INTRAVENOUS

## 2012-09-01 MED ORDER — PREDNISONE 1 MG PO TABS: 3.0000 mg | ORAL_TABLET | Freq: Every day | ORAL | Status: DC

## 2012-09-01 MED ORDER — HYDROMORPHONE HCL PF 1 MG/ML IJ SOLN
INTRAMUSCULAR | Status: AC
  Filled 2012-09-01: qty 1

## 2012-09-01 MED ORDER — ATENOLOL 50 MG PO TABS
50.0000 mg | ORAL_TABLET | Freq: Every day | ORAL | Status: DC
  Administered 2012-09-02 – 2012-09-04 (×3): 50 mg via ORAL
  Filled 2012-09-01 (×4): qty 1

## 2012-09-01 MED ORDER — LISINOPRIL 20 MG PO TABS
20.0000 mg | ORAL_TABLET | Freq: Every day | ORAL | Status: DC
  Administered 2012-09-02 – 2012-09-04 (×3): 20 mg via ORAL
  Filled 2012-09-01 (×4): qty 1

## 2012-09-01 MED ORDER — OXYCODONE HCL 5 MG PO TABS
5.0000 mg | ORAL_TABLET | ORAL | Status: DC | PRN
  Administered 2012-09-01 – 2012-09-04 (×12): 10 mg via ORAL
  Filled 2012-09-01 (×11): qty 2

## 2012-09-01 MED ORDER — ROCURONIUM BROMIDE 100 MG/10ML IV SOLN
INTRAVENOUS | Status: DC | PRN
  Administered 2012-09-01: 45 mg via INTRAVENOUS

## 2012-09-01 MED ORDER — LEVOTHYROXINE SODIUM 100 MCG PO TABS
100.0000 ug | ORAL_TABLET | Freq: Every day | ORAL | Status: DC
  Administered 2012-09-02 – 2012-09-04 (×3): 100 ug via ORAL
  Filled 2012-09-01 (×4): qty 1

## 2012-09-01 MED ORDER — VANCOMYCIN HCL IN DEXTROSE 1-5 GM/200ML-% IV SOLN
1000.0000 mg | Freq: Two times a day (BID) | INTRAVENOUS | Status: AC
  Administered 2012-09-01 – 2012-09-02 (×3): 1000 mg via INTRAVENOUS
  Filled 2012-09-01 (×3): qty 200

## 2012-09-01 MED ORDER — PROMETHAZINE HCL 25 MG/ML IJ SOLN
12.5000 mg | Freq: Once | INTRAMUSCULAR | Status: AC
  Administered 2012-09-01: 12.5 mg via INTRAVENOUS
  Filled 2012-09-01: qty 1

## 2012-09-01 MED ORDER — NEOSTIGMINE METHYLSULFATE 1 MG/ML IJ SOLN
INTRAMUSCULAR | Status: DC | PRN
  Administered 2012-09-01: 3 mg via INTRAVENOUS

## 2012-09-01 MED ORDER — PHENYLEPHRINE HCL 10 MG/ML IJ SOLN
INTRAMUSCULAR | Status: DC | PRN
  Administered 2012-09-01 (×2): 40 ug via INTRAVENOUS

## 2012-09-01 MED ORDER — ACETAMINOPHEN 10 MG/ML IV SOLN
1000.0000 mg | Freq: Once | INTRAVENOUS | Status: AC
  Administered 2012-09-01: 1000 mg via INTRAVENOUS
  Filled 2012-09-01: qty 100

## 2012-09-01 MED ORDER — METOCLOPRAMIDE HCL 5 MG/ML IJ SOLN
5.0000 mg | Freq: Three times a day (TID) | INTRAMUSCULAR | Status: DC | PRN
  Administered 2012-09-01: 10 mg via INTRAVENOUS
  Filled 2012-09-01 (×2): qty 2

## 2012-09-01 MED ORDER — GLYCOPYRROLATE 0.2 MG/ML IJ SOLN
INTRAMUSCULAR | Status: DC | PRN
  Administered 2012-09-01: 0.4 mg via INTRAVENOUS

## 2012-09-01 MED ORDER — ALIGN 4 MG PO CAPS: 4.0000 mg | ORAL_CAPSULE | ORAL | Status: DC

## 2012-09-01 MED ORDER — EPHEDRINE SULFATE 50 MG/ML IJ SOLN
INTRAMUSCULAR | Status: DC | PRN
  Administered 2012-09-01 (×2): 5 mg via INTRAVENOUS

## 2012-09-01 MED ORDER — DEXTROSE 5 % IV SOLN
INTRAVENOUS | Status: DC | PRN
  Administered 2012-09-01: 08:00:00 via INTRAVENOUS

## 2012-09-01 MED ORDER — ACETAMINOPHEN 10 MG/ML IV SOLN
1000.0000 mg | Freq: Four times a day (QID) | INTRAVENOUS | Status: AC
  Administered 2012-09-01 – 2012-09-02 (×3): 1000 mg via INTRAVENOUS
  Filled 2012-09-01 (×4): qty 100

## 2012-09-01 MED ORDER — FLEET ENEMA 7-19 GM/118ML RE ENEM: 1.0000 | ENEMA | Freq: Once | RECTAL | Status: AC | PRN

## 2012-09-01 MED ORDER — PROPOFOL 10 MG/ML IV BOLUS
INTRAVENOUS | Status: DC | PRN
  Administered 2012-09-01: 130 mg via INTRAVENOUS
  Administered 2012-09-01: 50 mg via INTRAVENOUS

## 2012-09-01 MED ORDER — PRAVASTATIN SODIUM 40 MG PO TABS
80.0000 mg | ORAL_TABLET | Freq: Every day | ORAL | Status: DC
  Administered 2012-09-02 – 2012-09-04 (×3): 80 mg via ORAL
  Filled 2012-09-01 (×4): qty 2

## 2012-09-01 MED ORDER — ONDANSETRON HCL 4 MG PO TABS: 4.0000 mg | ORAL_TABLET | Freq: Four times a day (QID) | ORAL | Status: DC | PRN

## 2012-09-01 MED ORDER — LACTATED RINGERS IV SOLN
INTRAVENOUS | Status: DC | PRN
  Administered 2012-09-01 (×2): via INTRAVENOUS

## 2012-09-01 MED ORDER — SACCHAROMYCES BOULARDII 250 MG PO CAPS
250.0000 mg | ORAL_CAPSULE | ORAL | Status: DC
  Filled 2012-09-01 (×2): qty 1

## 2012-09-01 MED ORDER — METOCLOPRAMIDE HCL 10 MG PO TABS: 5.0000 mg | ORAL_TABLET | Freq: Three times a day (TID) | ORAL | Status: DC | PRN

## 2012-09-01 MED ORDER — FOLIC ACID 1 MG PO TABS
1.0000 mg | ORAL_TABLET | Freq: Every day | ORAL | Status: DC
  Administered 2012-09-02 – 2012-09-04 (×3): 1 mg via ORAL
  Filled 2012-09-01 (×4): qty 1

## 2012-09-01 MED ORDER — HYDROCORTISONE SOD SUCCINATE 100 MG IJ SOLR
INTRAMUSCULAR | Status: DC | PRN
  Administered 2012-09-01: 100 mg via INTRAVENOUS

## 2012-09-01 SURGICAL SUPPLY — 64 items
Ankle locking Nail   10 x 240 mm ×2 IMPLANT
BANDAGE ELASTIC 4 VELCRO ST LF (GAUZE/BANDAGES/DRESSINGS) ×5 IMPLANT
BANDAGE ELASTIC 6 VELCRO ST LF (GAUZE/BANDAGES/DRESSINGS) ×1 IMPLANT
BANDAGE ESMARK 6X9 LF (GAUZE/BANDAGES/DRESSINGS) IMPLANT
BANDAGE GAUZE ELAST BULKY 4 IN (GAUZE/BANDAGES/DRESSINGS) ×7 IMPLANT
BEAD-TIP GUIDEWIRE ×2 IMPLANT
BIT DRILL CALIBRATED 4.3X320MM (BIT) ×1 IMPLANT
BIT DRILL CANN 7X200 (BIT) ×2 IMPLANT
BIT DRILL CROWE POINT TWST 4.3 (DRILL) ×1 IMPLANT
BNDG CMPR 9X6 STRL LF SNTH (GAUZE/BANDAGES/DRESSINGS)
BNDG COHESIVE 4X5 TAN STRL (GAUZE/BANDAGES/DRESSINGS) ×3 IMPLANT
BNDG ESMARK 6X9 LF (GAUZE/BANDAGES/DRESSINGS)
CLOTH BEACON ORANGE TIMEOUT ST (SAFETY) ×3 IMPLANT
COVER SURGICAL LIGHT HANDLE (MISCELLANEOUS) ×6 IMPLANT
CUFF TOURNIQUET SINGLE 34IN LL (TOURNIQUET CUFF) IMPLANT
CUFF TOURNIQUET SINGLE 44IN (TOURNIQUET CUFF) IMPLANT
DRAPE C-ARM 42X72 X-RAY (DRAPES) ×3 IMPLANT
DRAPE C-ARMOR (DRAPES) ×2 IMPLANT
DRAPE INCISE IOBAN 66X45 STRL (DRAPES) ×3 IMPLANT
DRAPE ORTHO SPLIT 77X108 STRL (DRAPES) ×6
DRAPE PROXIMA HALF (DRAPES) ×4 IMPLANT
DRAPE SURG ORHT 6 SPLT 77X108 (DRAPES) ×4 IMPLANT
DRAPE U-SHAPE 47X51 STRL (DRAPES) ×1 IMPLANT
DRILL CALIBRATED 4.3X320MM (BIT) ×3
DRILL CROWE POINT TWIST 4.3 (DRILL) ×3
DURAPREP 26ML APPLICATOR (WOUND CARE) ×3 IMPLANT
ELECT REM PT RETURN 9FT ADLT (ELECTROSURGICAL) ×3
ELECTRODE REM PT RTRN 9FT ADLT (ELECTROSURGICAL) ×2 IMPLANT
EVACUATOR 1/8 PVC DRAIN (DRAIN) IMPLANT
FACESHIELD OPICON LG (MASK) ×6 IMPLANT
GAUZE XEROFORM 1X8 LF (GAUZE/BANDAGES/DRESSINGS) ×3 IMPLANT
GLOVE BIOGEL PI IND STRL 8 (GLOVE) ×2 IMPLANT
GLOVE BIOGEL PI IND STRL 8.5 (GLOVE) IMPLANT
GLOVE BIOGEL PI INDICATOR 8 (GLOVE) ×1
GLOVE BIOGEL PI INDICATOR 8.5 (GLOVE)
GLOVE ECLIPSE 8.0 STRL XLNG CF (GLOVE) ×3 IMPLANT
GLOVE SURG ORTHO 8.5 STRL (GLOVE) IMPLANT
GOWN PREVENTION PLUS XLARGE (GOWN DISPOSABLE) ×3 IMPLANT
GOWN STRL NON-REIN LRG LVL3 (GOWN DISPOSABLE) ×5 IMPLANT
GUIDEPIN 3.2X17.5 THRD DISP (PIN) ×4 IMPLANT
KIT BASIN OR (CUSTOM PROCEDURE TRAY) ×3 IMPLANT
KIT ROOM TURNOVER OR (KITS) ×3 IMPLANT
PACK GENERAL/GYN (CUSTOM PROCEDURE TRAY) ×3 IMPLANT
PAD ARMBOARD 7.5X6 YLW CONV (MISCELLANEOUS) ×6 IMPLANT
SAW BLADE ×2 IMPLANT
SCREW CORT TI DBL LEAD 5X24 (Screw) ×4 IMPLANT
SCREW CORT TI DBL LEAD 5X30 (Screw) ×2 IMPLANT
SCREW CORT TI DBL LEAD 5X50 (Screw) ×2 IMPLANT
SCREW CORT TI DBL LEAD 5X65 (Screw) ×2 IMPLANT
SPONGE GAUZE 4X4 12PLY (GAUZE/BANDAGES/DRESSINGS) ×3 IMPLANT
STAPLER VISISTAT 35W (STAPLE) ×3 IMPLANT
STOCKINETTE 6  STRL (DRAPES) ×1
STOCKINETTE 6 STRL (DRAPES) ×1 IMPLANT
SUCTION FRAZIER TIP 10 FR DISP (SUCTIONS) ×2 IMPLANT
SUT MNCRL AB 3-0 PS2 18 (SUTURE) ×2 IMPLANT
SUT VIC AB 0 CT1 27 (SUTURE) ×3
SUT VIC AB 0 CT1 27XBRD ANBCTR (SUTURE) ×2 IMPLANT
SUT VIC AB 2-0 CT1 27 (SUTURE) ×3
SUT VIC AB 2-0 CT1 TAPERPNT 27 (SUTURE) ×2 IMPLANT
TOWEL NATURAL 10PK STERILE (DISPOSABLE) ×2 IMPLANT
TOWEL OR 17X24 6PK STRL BLUE (TOWEL DISPOSABLE) ×3 IMPLANT
TOWEL OR 17X26 10 PK STRL BLUE (TOWEL DISPOSABLE) ×5 IMPLANT
TUBE CONNECTING 12X1/4 (SUCTIONS) ×3 IMPLANT
YANKAUER SUCT BULB TIP NO VENT (SUCTIONS) ×3 IMPLANT

## 2012-09-01 NOTE — Progress Notes (Signed)
Orthopedic Tech Progress Note Patient Details:  [name] 03/06/1946 [id number] Applied overhead frame.     [name] 09/01/2012, 8:55 PM

## 2012-09-01 NOTE — Anesthesia Preprocedure Evaluation (Addendum)
Anesthesia Evaluation  Patient identified by MRN, date of birth, ID band Patient awake    Airway Mallampati: II      Dental   Pulmonary shortness of breath and with exertion, pneumonia -,  breath sounds clear to auscultation        Cardiovascular hypertension, + angina + CAD and + Peripheral Vascular Disease Rhythm:Regular Rate:Normal     Neuro/Psych    GI/Hepatic Neg liver ROS, hiatal hernia, GERD-  ,  Endo/Other  diabetesHypothyroidism   Renal/GU negative Renal ROS     Musculoskeletal  (+) Arthritis -,   Abdominal   Peds  Hematology   Anesthesia Other Findings   Reproductive/Obstetrics                          Anesthesia Physical Anesthesia Plan  ASA: III  Anesthesia Plan: General   Post-op Pain Management:    Induction: Intravenous  Airway Management Planned: Oral ETT  Additional Equipment:   Intra-op Plan:   Post-operative Plan: Possible Post-op intubation/ventilation  Informed Consent: I have reviewed the patients History and Physical, chart, labs and discussed the procedure including the risks, benefits and alternatives for the proposed anesthesia with the patient or authorized representative who has indicated his/her understanding and acceptance.     Plan Discussed with: CRNA, Anesthesiologist and Surgeon  Anesthesia Plan Comments:         Anesthesia Quick Evaluation

## 2012-09-01 NOTE — Op Note (Signed)
PATIENT ID:      DERRY KASSEL  MRN:     [id number] DOB/AGE:    [birth year] / 66 y.o.       OPERATIVE REPORT    DATE OF PROCEDURE:  09/01/2012       PREOPERATIVE DIAGNOSIS:   Non Union Lt. Distal Tibia/Fibula Fracture                                                       Estimated Body mass index is 22.81 kg/(m^2) as calculated from the following:   Height as of 08/10/12: 5\' 8" (1.727 m).   Weight as of 08/10/12: 150 lb(68.04 kg).     POSTOPERATIVE DIAGNOSIS:   Non Union Lt. Distal Tibia/Fibula Fracture                                                                     Estimated Body mass index is 22.81 kg/(m^2) as calculated from the following:   Height as of 08/10/12: 5\' 8" (1.727 m).   Weight as of 08/10/12: 150 lb(68.04 kg).     PROCEDURE: left fibula osteotomy  with correction of varus non union tibia and insertion of Biomet retrograde tibial nail     SURGEON:  [name], MD    ASSISTANT:   Jacqualine Code, PA-C   (Present and scrubbed throughout the case, critical for assistance with exposure, retraction, instrumentation, and closure.)          ANESTHESIA: general      DRAINS: none    TOURNIQUET TIME: * Missing tourniquet times found for documented tourniquets in log:  71466 * 117 minutes  COMPLICATIONS:  none  CONDITION:  stable  PROCEDURE IN DETAIL: [id number]   WHITFIELD, PETER W 09/01/2012, 10:34 AM

## 2012-09-01 NOTE — Progress Notes (Signed)
UR COMPLETED  

## 2012-09-01 NOTE — Progress Notes (Signed)
Received from Lowella Fairy

## 2012-09-01 NOTE — Op Note (Signed)
[name], [name]                  ACCOUNT NO.:  [id number]  MEDICAL RECORD NO.:  [id number]  LOCATION:  5N28C                        FACILITY:  [facility]  PHYSICIAN:  Claude Manges. Aahil Fredin, M.D.DATE OF BIRTH:  Apr 11, 1946  DATE OF PROCEDURE:  09/01/2012 DATE OF DISCHARGE:                              OPERATIVE REPORT   PREOPERATIVE DIAGNOSES:  Painful nonunion, left distal tibia and fibula fracture and the patient with rheumatoid arthritis and fused ankle and subtalar joint.  POSTOPERATIVE DIAGNOSES:  Painful nonunion, left distal tibia and fibula fracture and the patient with rheumatoid arthritis and fused ankle and subtalar joint.  PROCEDURES: 1. Osteotomy of left distal fibula. 2. Correction of varus nonunion, left distal tibia with insertion of a     Biomet Retrograde tibial nail.  SURGEON:  Claude Manges. Cleophas Dunker, M.D.  ASSISTANT:  Arlys John D. Petrarca, P.A.-C., who was present throughout the operative procedure.  ANESTHESIA:  General.  COMPLICATIONS:  None.  COMPONENTS:  Biomet 24-cm long x 10-mm wide titanium nail.  PROCEDURE:  [name] was met in the holding area, identified the left lower extremity as the appropriate operative site.  The skin was intact. He did have some preoperative tingling in his left foot that he has had for a long time, and there was an established nonunion and gross motion at the fracture site of the distal tibia and fibula with a varus position.  [name] was then transported to room #7 and placed under general anesthesia without difficulty. Tourniquet was applied to the left thigh.  The left leg was then prepped with Betadine scrub and DuraPrep from the tourniquet to the tips of the toes.  Sterile draping was performed.  Time-out was called.  Manipulation of the fracture was attempted using the image intensifier and we could correct the varus position about 20 degrees to about 10, but not adequate enough to insert a tibial nail.  Accordingly  using image intensification, it was able to localize the distal tibia and fibula fracture.  I made an incision between the two at the level of the fracture site.  It is about a 3-cm incision and via sharp dissection, it was carried down to the subcutaneous tissue.  At that point, I was able to bluntly free the periosteum from the fibula and at the fracture site, I removed about a centimeter and half of fibula using a small oscillating saw and the rongeur.  At that point, I was able to correct the varus nonunion of the tibia to nearly neutral.  The fracture was not exposed.  I did use a tourniquet at 325 mmHg after elevating the leg Esmarching the extremity.  At that point, I proceeded with insertion of the Retrograde tibial nail with a plantar approach.  I aligned the guidepin externally and felt that we had enough correction with the fracture to insert it through the distal tibial metaphysis and into the proximal shaft.  The patient has had a prior surgery on his foot and appeared to have a fusion of the subtalar joint and the ankle joint by virtue of his rheumatoid arthritis.  About 1 cm of incision was made in the plantar aspect of  the foot anterior to the plantar fascia insertion on the os calcis.  Then by blunt dissection, I was able to insert the guidepin into the os calcis, and then with several passes, inserted through the os calcis, the talus through the distal tibial metaphysis, and then across the fracture site into the proximal tibia.  At that point, I used a 7-mm reamer over the guidepin and reaming through the same structures.  The ball-tipped guidepin was then inserted and placed with the maintaining reduction of the fracture in both AP and lateral projections.  The pin was easily placed into the proximal tibia.  We measured a 24-cm long nail.  Reaming was performed to 10.5 mm for the 10 mm nail.  The titanium nail was then placed over the guidepin using the external  guide and recessed about 1 cm into the os calcis as noted by image intensification.  The two proximal screw holes were then drilled, measured, and filled with a self-tapping titanium screws.  We checked in both AP and lateral projections, felt that the screws were within the tibia as well as the nail.  We then performed compression across the fracture using the device and then inserted two screws, one from medial to lateral in the talus and other one from posterior to anterior in the os calcis again using the external guide.  We had very nice position and good purchase of the screws, which were not palpable.  At that point, we had the tourniquet at 117 minutes.  The tourniquet was deflated.  We had immediate capillary refill to the toes with good bleeding through all the small incisions.  These small puncture wounds where we inserted the screws were closed with skin staples.  The 3-cm incision over the distal tibia and fibula were then closed in several layers.  The fascia was closed with a 2-0 Vicryl, subcu with 3-0 Monocryl, and skin closed with skin clips.  We were careful to check for the peroneal nerves, which were not visible and I felt that we had nice compression at the tibial fracture site that I did not require bone grafting.  The patient tolerated the procedure without complications and was returned to the postanesthesia recovery room without a problem.     Claude Manges. Cleophas Dunker, M.D.     PWW/MEDQ  D:  09/01/2012  T:  09/01/2012  Job:  [id number]

## 2012-09-01 NOTE — Progress Notes (Signed)
Cardiology PA visited patient at bedside saw latest ekg

## 2012-09-01 NOTE — Progress Notes (Signed)
Report given to jamie rn as caregiver 

## 2012-09-01 NOTE — Progress Notes (Signed)
Patient ID: [name], male   DOB: [birth year], 66 y.o.   MRN: [id number] The recent History & Physical has been reviewed. I have personally examined the patient today. There is no interval change to the documented History & Physical. The patient would like to proceed with the procedure.  WHITFIELD, PETER W 09/01/2012,  7:23 AM

## 2012-09-01 NOTE — Transfer of Care (Signed)
Immediate Anesthesia Transfer of Care Note  Patient: [name]  Procedure(s) Performed: Procedure(s) (LRB) with comments: ANKLE FUSION (Left) - Take down of angulated tibial/fibula Fractures  Patient Location: PACU  Anesthesia Type:General  Level of Consciousness: awake, alert  and oriented  Airway & Oxygen Therapy: Patient Spontanous Breathing and Patient connected to nasal cannula oxygen  Post-op Assessment: Report given to PACU RN, Post -op Vital signs reviewed and stable and ST depression continues, PACU RN, surgeon, and anesthesiologist aware. Patient denies chest pain.  Post vital signs: Reviewed and stable  Complications: No apparent anesthesia complications

## 2012-09-01 NOTE — Anesthesia Postprocedure Evaluation (Signed)
  Anesthesia Post-op Note  Patient: [name]  Procedure(s) Performed: Procedure(s) (LRB) with comments: ANKLE FUSION (Left) - Take down of angulated tibial/fibula Fractures  Patient Location: PACU  Anesthesia Type:General  Level of Consciousness: awake  Airway and Oxygen Therapy: Patient Spontanous Breathing  Post-op Pain: mild  Post-op Assessment: Post-op Vital signs reviewed  Post-op Vital Signs: Reviewed  Complications: No apparent anesthesia complications

## 2012-09-01 NOTE — Preoperative (Signed)
Beta Blockers   Reason not to administer Beta Blockers:Not Applicable, took atenolol this am 

## 2012-09-01 NOTE — Addendum Note (Signed)
Addendum  created 09/01/12 1640 by Margaree Mackintosh, CRNA   Modules edited:Anesthesia Flowsheet

## 2012-09-01 NOTE — Anesthesia Procedure Notes (Signed)
Procedure Name: Intubation Date/Time: 09/01/2012 7:47 AM Performed by: Margaree Mackintosh Pre-anesthesia Checklist: Patient identified, Timeout performed, Emergency Drugs available, Suction available and Patient being monitored Patient Re-evaluated:Patient Re-evaluated prior to inductionOxygen Delivery Method: Circle system utilized Preoxygenation: Pre-oxygenation with 100% oxygen Intubation Type: IV induction Ventilation: Mask ventilation without difficulty Laryngoscope Size: Mac and 3 Grade View: Grade I Tube type: Oral Tube size: 7.5 mm Number of attempts: 1 Airway Equipment and Method: Stylet Placement Confirmation: ETT inserted through vocal cords under direct vision,  positive ETCO2 and breath sounds checked- equal and bilateral Secured at: 22 cm Tube secured with: Tape Dental Injury: Teeth and Oropharynx as per pre-operative assessment

## 2012-09-01 NOTE — Addendum Note (Signed)
Addendum  created 09/01/12 1640 by Tarun Patchell B Yacoub, CRNA   Modules edited:Anesthesia Flowsheet    

## 2012-09-01 NOTE — Consult Note (Signed)
CARDIOLOGY CONSULT NOTE  Patient ID: [name], MRN: [id number], DOB/AGE: 12/10/1945 66 y.o. Admit date: 09/01/2012 Date of Consult: 09/01/2012  Primary Physician: [name], MD Primary Cardiologist: Dr. [name]  Chief Complaint: Tib/Fib fracture requiring surgical repair Reason for Consultation: Abnormal EKG   HPI: 66 y.o. male w/ PMHx significant for CAD (s/p CABG 1998), Diabetes (diet controlled), HTN, HLD, PVD (s/p left iliac stent),cerebrovascular dz, chronic lung dz, RA (w/ nephritis), and hypothyroidism who presented to [facility] on 09/01/2012 for surgical repair of tib/fib fracture  Last cath 2009 showed progression of native disease, but no significant occlusions, patent LIMA to LAD and SVG to OM1/2, chronic occlusion of SVG to PDA/RCA, EF 60%, medical therapy. Myoview 2012 was low risk, EF 61%. Echo 2012 showed mild LVH, EF 60-65%, mod diastolic dysfunction. He is a patient of Dr. 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 During this visit atorvastatin 40mg  was started and his atenolol was increased in anticipation of upcoming surgery. He reports that his activity has been somewhat limited by left leg injury. He reports occasional chest "discomfort" over the last several years with ~2 episodes over the last couple of months. These episodes only last < 30secs. No shortness of breath, orthopnea, PND, LE edema, syncope, fever, chills, abd pain, change in bladder/bowel habits, melena/hematochezia.   He underwent left fibula osteotomy with correction of varus non union tibia and insertion of Biomet retrograde tibial nail this morning without complication. Cardiology is asked to evaluate due to post op EKG with concern for ST segment changes. Post op EKG shows NSR  78bpm, LVH, slightly more pronounced ST depression in I, aVL (<58mm) and slightly more pronounced ST elevation III (<41mm), overall grossly unchanged. Patient is hemodynamically stable and without complaints of chest pain or sob.   Past Medical History  Diagnosis Date  . 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 Rheumatoid arthritis     with nephritis  . Hyperlipidemia   . Hypertension   . PVD (peripheral vascular disease)     Left iliac PCI/stent  . Cerebrovascular disease   . Tobacco abuse, in remission     Remote  . GERD (gastroesophageal reflux disease)   . Hypothyroid   . Allergic rhinitis   . Chronic lung disease     ?  Rheumatoid lung; ?  Adverse reaction to methotrexate  . Cervical spondylosis   . Schatzki's ring   . Hx of Clostridium difficile infection   . Achalasia   . Hiatal hernia   . Coronary artery disease   . Anginal pain     occ  . Heart murmur   . Shortness of breath     occ  . Pneumonia     recent pneumonia  . Diabetes mellitus     borderline no meds  . Stones in the urinary tract      05/2011 - Myoview IMPRESSION: Abnormal pharmacologic stress nuclear myocardial study revealing a nondiagnostic stress EKG due to the presence of baseline ST-segment abnormalities, normal left ventricular size and normal overall left ventricular systolic function with a segmental Kenner Lewan motion abnormality as described. By scintigraphic imaging, there appeared to be mild scarring at the base of the inferoseptal segment with minimal superimposed ischemia. The possibility of variable diaphragmatic attenuation  causing these findings should be considered. Although abnormal, these study finding predict a low risk for perioperative cardiac complications. Other findings as noted.  09/2010 - Echo Study Conclusions  Left ventricle: The cavity size was normal. Marwah Disbro thickness was increased in a pattern of mild LVH. Systolic function was normal. The estimated ejection  fraction was in the range of 60% to 65%. Features are consistent with a pseudonormal left ventricular filling pattern, with concomitant abnormal relaxation and increased filling pressure (grade 2 diastolic dysfunction).  2009 - Cath HEMODYNAMIC RESULTS: Left ventricle 139/16 mmHg. Aorta 136/67 mmHg.  ANGIOGRAPHIC FINDINGS:  1. Left main coronary artery is severely diseased to approximately 95% in diffuse fashion. This has progressed compared to the previous angiogram. This vessel gives rise to the left anterior descending, a very small ramus intermedius, and circumflex vessels.  2. The left anterior descending is occluded proximally.  3. The circumflex coronary artery is occluded proximally.  4. The right coronary artery is occluded proximally just after the takeoff of a right ventricular marginal branch. There are some right to right bridging collaterals noted filling the right coronary artery nearly down to the distal portion.  5. The saphenous vein graft to the first and second obtuse marginals is widely patent. There is an area of 30-40% stenosis within the proximal vessel although not clearly flow-limiting. The anastomotic sites look to be intact.  6. There is known occlusion of the saphenous vein graft of the right coronary system.  7. The LIMA to the left anterior descending and diagonal is widely patent. Anastomotic sites are intact. Of note, the distal left anterior descending is diffusely diseased, and there is an apical 90% stenosis noted. The diagonal branch is also diffusely diseased and relatively small.  8. There is some left-to-right collateralization of the distal right coronary artery system, although not well-developed.  Ventriculography is performed in the RAO projection and reveals an ejection fraction of approximately 60% with no significant Maryon Kemnitz motion abnormality and no mitral regurgitation.  DIAGNOSES:  1. Severe native coronary artery disease as outlined. There has been  progression in left main disease, although continued occlusion proximally of the left anterior descending and circumflex vessels. The distal left anterior descending is diffusely diseased, and there is some left-to-right collateralization of the occluded right coronary artery although not to a large degree.  2. Patent left internal mammary artery to left anterior descending and diagonal, patent saphenous vein graft to the first and second obtuse marginal with 30-40% proximal vein graft stenosis (not clearly flow-limiting), and known occlusion of the saphenous vein graft to posterior descending and right coronary artery.  3. Left ventricular ejection fraction of approximately 60% with no large focal Jiyah Torpey motion abnormality and a left ventricular end- diastolic pressure of 16 mmHg. No significant mitral regurgitation is noted.  DISCUSSION: No obvious revascularization options noted at this time. Suspect that angina may well be related to progression in the patient's native disease rather than new hemodynamically significant occlusions within his remaining 2 bypass grafts which are widely patent. There is 30-40% stenosis within the saphenous vein graft of the obtuse marginal system, although this does not appear to be flow-limiting. At this point, medical therapy seems to be the best option. I discussed this with the patient, his family, and with Dr. [name].   Surgical History:  Past Surgical History  Procedure Date  . Coronary artery bypass graft     x6 In 1998  . Total knee arthroplasty     Right  .  Ankle fusion     Left  . Wrist fusion     Right  . Shoulder surgery   . Colonoscopy 09/12/2011    Dr. Elly Modena hemorrhoids, normal colon and distal terminal ileum. Random bx negative. Stool for CDiff positive.  . Esophagogastroduodenoscopy 09/13/2011    Dr. Lyndle Herrlich plawues mid-esophagus KOH negative. Distal esophageal ring and ulcer. Mild gastritis. duodenal diverticulum. Savaory  dilation 16mm.   Gaspar Bidding dilation 09/13/2011  . Esophageal manometry 09/30/2011    Procedure: ESOPHAGEAL MANOMETRY (EM);  Surgeon: Rob Bunting, MD;  Location: [facility];  Service: Endoscopy;  Laterality: N/A;  . Esophagogastroduodenoscopy 09/12/2011    Dr. Rinaldo Ratel rings s/p dilation  . Cardiac surgery   . Esophagomyotomy 11/12/11    [facility]- with DOR antireflux surgery  . Esophagogastroduodenoscopy 06/25/2012    Procedure: ESOPHAGOGASTRODUODENOSCOPY (EGD);  Surgeon: Corbin Ade, MD;  Location: [facility];  Service: Endoscopy;  Laterality: N/A;  7:30  . Savory dilation 06/25/2012    Procedure: SAVORY DILATION;  Surgeon: Corbin Ade, MD;  Location: [facility];  Service: Endoscopy;  Laterality: N/A;  Elease Hashimoto dilation 06/25/2012    Procedure: Elease Hashimoto DILATION;  Surgeon: Corbin Ade, MD;  Location: [facility];  Service: Endoscopy;  Laterality: N/A;     Home Meds: Medication Sig  acetaminophen (TYLENOL) 500 MG tablet Take 1,000 mg by mouth every morning.   aspirin EC 81 MG tablet Take 81 mg by mouth daily.    atenolol (TENORMIN) 50 MG tablet Take 1 tablet (50 mg total) by mouth daily.  cholecalciferol (VITAMIN D) 400 UNITS TABS Take 400 Units by mouth daily.   colchicine 0.6 MG tablet Take 0.6 mg by mouth daily.  folic acid (FOLVITE) 1 MG tablet Take 1 mg by mouth daily.    ibuprofen (ADVIL,MOTRIN) 800 MG tablet Take 800 mg by mouth 3 (three) times daily.   levothyroxine (SYNTHROID, LEVOTHROID) 100 MCG tablet Take 100 mcg by mouth daily.  lisinopril (PRINIVIL,ZESTRIL) 20 MG tablet Take 20 mg by mouth daily.    Omega-3 Fatty Acids (FISH OIL) 500 MG CAPS Take 1 capsule by mouth daily.  Omeprazole (PRILOSEC PO) Take 1 tablet by mouth 2 (two) times daily.  pravastatin (PRAVACHOL) 80 MG tablet Take 80 mg by mouth daily.  predniSONE (DELTASONE) 1 MG tablet Take 2 mg by mouth daily. Along with 5mg =8mg   predniSONE (DELTASONE) 5 MG tablet Take 5 mg by mouth daily. Along  with 3mg =8mg   Probiotic Product (ALIGN) 4 MG CAPS Take 4 mg by mouth 2 (two) times a week.   Teriparatide, Recombinant, (FORTEO) 600 MCG/2.4ML SOLN Inject 2.4 mcg into the skin daily.  Vitamin D, Ergocalciferol, (DRISDOL) 50000 UNITS CAPS Take 50,000 Units by mouth every 7 (seven) days. Mondays    Inpatient Medications:   . [COMPLETED] acetaminophen  1,000 mg Intravenous Once  . chlorhexidine  60 mL Topical Once  . HYDROmorphone      . HYDROmorphone      . oxyCODONE      . [COMPLETED] vancomycin  1,000 mg Intravenous 60 min Pre-Op    Allergies:  Allergies  Allergen Reactions  . Amoxicillin Hives  . Doxycycline Nausea And Vomiting  . Levofloxacin Nausea And Vomiting    History   Social History  . Marital Status: Married    Spouse Name: N/A    Number of Children: 1  . Years of Education: N/A   Occupational History  . retired [employer]    Social History Main Topics  .  Smoking status: Former Smoker -- 0.5 packs/day for 5 years    Types: Cigarettes    Quit date: 09/23/1965  . Smokeless tobacco: Not on file  . Alcohol Use: No  . Drug Use: No  . Sexually Active: Not on file   Other Topics Concern  . Not on file   Social History Narrative  . No narrative on file     Family History  Problem Relation Age of Onset  . Stomach cancer Mother 41  . Heart attack Father   . Heart disease Brother   . Leukemia Brother   . Lung disease Son     infant  . Lung disease Daughter     infant     Review of Systems: General: negative for chills, fever, night sweats or weight changes.  Cardiovascular: As per HPI Dermatological: negative for rash Respiratory: negative for cough or wheezing Urologic: negative for hematuria Abdominal: negative for nausea, vomiting, diarrhea, bright red blood per rectum, melena, or hematemesis Neurologic: negative for visual changes, syncope, or dizziness Musculoskeletal: (+) Left LE pain All other systems reviewed and are otherwise  negative except as noted above.  Labs:  Component Value Date   WBC 12.4* 08/28/2012   HGB 13.3 08/28/2012   HCT 40.3 08/28/2012   MCV 92.6 08/28/2012   PLT 205 08/28/2012    Lab 08/28/12 1453  NA 140  K 4.2  CL 105  CO2 23  BUN 19  CREATININE 1.08  CALCIUM 9.8  PROT 7.0  BILITOT 0.3  ALKPHOS 79  ALT 23  AST 35  GLUCOSE 146*    Radiology/Studies:   08/28/2012 - CHEST - 2 VIEW Findings: There are changes from CABG surgery.  The cardiac silhouette is normal in size.  No mediastinal or hilar mass or adenopathy is noted.  The lungs show mild interstitial thickening most prominently at the peripheral bases.  This is stable.  The lungs are otherwise clear.  Old rib fractures are noted on the right.  IMPRESSION: No acute cardiopulmonary disease.     09/01/2012 - LEFT TIBIA AND FIBULA - 2 VIEW,DG C-ARM GT 120 MIN,LEFT ANKLE COMPLETE - 3+ VIEW   Findings: Intramedullary rod has been placed within the left tibia extending through the left tibia - talar articulation with the distal aspect within the left talus.  Proximal and distal fixation screws.  Interruption distal left fibula with gap and slight angulation.  IMPRESSION: Fixation of the left tibia - talar joint space.    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 rate 15, SpO2 99.00%. General: Well developed elderly white male in no acute distress. Head: Normocephalic, atraumatic, sclera non-icteric, no xanthomas, nares are without discharge.  Neck: Supple. No JVD.  Lungs: Fine rales RLL. No wheeze or rhonchi. Breathing is unlabored. Heart: RRR with S1 S2. No murmurs, rubs, or gallops appreciated. Abdomen: Soft,  non-tender, non-distended with normoactive bowel sounds. No hepatomegaly. No rebound/guarding. No obvious abdominal masses. Msk:  Strength and tone appear normal for age. Extremities: Left LE in post op splint. Toes warm and pink. No clubbing or cyanosis. No RLE edema.  Right pedal pulse intact. Neuro: Alert and oriented X 3. Moves all extremities spontaneously. Psych:  Responds to questions appropriately with a normal affect.   Assessment and Plan:   66 y.o. male w/ PMHx significant for CAD (s/p CABG 1998), Diabetes (diet controlled), HTN, HLD, PVD (s/p left iliac stent),cerebrovascular dz, chronic lung dz, RA (w/ nephritis), and hypothyroidism who presented to [facility] on 09/01/2012 for surgical repair of tib/fib fracture  1. Abnormal EKG 2. Left Tib/Fib Fracture s/p surgical repair 3. Coronary Artery Disease s/p CABG 1998 4. Hypertension 5. Hyperlipidemia  Patient resting comfortably s/p Left tib/fib repair. Cardiac history as above with low risk myoview in 2012 and normal EF. EKG grossly unchanged from prior EKGs. He denies chest pain or sob. Do not suspect acute cardiac ischemia. SBPs elevated. Would resume home BB, ACEI, statin, and ASA as soon as he is taking orals.   Signed, HOPE, JESSICA PA-C 09/01/2012, 12:58 PM   I have taken a history, reviewed medications, allergies, PMH, SH, FH, and reviewed ROS and examined the patient.  I agree with the assessment and plan. His EKG is essentially unchanged and he is asymptomatic. No further evaluation indicated.  Kordelia Severin C. Daleen Squibb, MD, [facility] Pager:  [phone]

## 2012-09-02 ENCOUNTER — Encounter (HOSPITAL_COMMUNITY): Payer: Self-pay | Admitting: Orthopaedic Surgery

## 2012-09-02 LAB — CBC
Hemoglobin: 11.3 g/dL — ABNORMAL LOW (ref 13.0–17.0)
MCH: 31 pg (ref 26.0–34.0)
MCHC: 33.1 g/dL (ref 30.0–36.0)
Platelets: 148 10*3/uL — ABNORMAL LOW (ref 150–400)
RDW: 14.6 % (ref 11.5–15.5)

## 2012-09-02 LAB — COMPREHENSIVE METABOLIC PANEL
Albumin: 2.9 g/dL — ABNORMAL LOW (ref 3.5–5.2)
Alkaline Phosphatase: 67 U/L (ref 39–117)
BUN: 12 mg/dL (ref 6–23)
Chloride: 103 mEq/L (ref 96–112)
Creatinine, Ser: 1.08 mg/dL (ref 0.50–1.35)
GFR calc Af Amer: 80 mL/min — ABNORMAL LOW (ref >90)
Glucose, Bld: 127 mg/dL — ABNORMAL HIGH (ref 70–99)
Potassium: 3.6 mEq/L (ref 3.5–5.1)
Total Bilirubin: 0.5 mg/dL (ref 0.3–1.2)

## 2012-09-02 LAB — GLUCOSE, CAPILLARY
Glucose-Capillary: 117 mg/dL — ABNORMAL HIGH (ref 70–99)
Glucose-Capillary: 135 mg/dL — ABNORMAL HIGH (ref 70–99)

## 2012-09-02 MED ORDER — METHOCARBAMOL 500 MG PO TABS
500.0000 mg | ORAL_TABLET | Freq: Four times a day (QID) | ORAL | Status: DC | PRN
  Administered 2012-09-02 – 2012-09-04 (×4): 500 mg via ORAL
  Filled 2012-09-02 (×4): qty 1

## 2012-09-02 MED ORDER — GABAPENTIN 100 MG PO CAPS
100.0000 mg | ORAL_CAPSULE | Freq: Every day | ORAL | Status: AC
  Filled 2012-09-02: qty 1

## 2012-09-02 NOTE — Evaluation (Signed)
Physical Therapy Evaluation Patient Details Name: [name] MRN: [id number] DOB: 01-02-1946 Today's Date: 09/02/2012 Time: 3086-5784 PT Time Calculation (min): 38 min  PT Assessment / Plan / Recommendation Clinical Impression  pt rpesents with L ankle fusion.  pt with long history of RA limiting strength and use of all extremities.  pt would benefit from CIR at D/C to maximize independence prior to return home with wife as wife is only able to provide limited physical A and son has to return to work on Monday.  ? if pt would agree to rehab, however CIR would be safest D/C plan.  If pt refuses CIR will need HHPT, OT, and Aide, and 3-in-1 and W/C.      PT Assessment  Patient needs continued PT services    Follow Up Recommendations  CIR    Does the patient have the potential to tolerate intense rehabilitation      Barriers to Discharge Decreased caregiver support Wife only able to provide limited physical A.      Equipment Recommendations  Wheelchair (measurements PT);Wheelchair cushion (measurements PT) (3-in-1)    Recommendations for Other Services Rehab consult   Frequency Min 5X/week    Precautions / Restrictions Precautions Precautions: Fall Precaution Comments: pt wears special shoes to protect feet.  Needs shoe for R foot and post-op shoe on L.   Required Braces or Orthoses: Other Brace/Splint Other Brace/Splint: L post op shoe Restrictions Weight Bearing Restrictions: Yes LLE Weight Bearing: Partial weight bearing LLE Partial Weight Bearing Percentage or Pounds: 50% Other Position/Activity Restrictions: Pt with RA.   Difficult to use walker due to wrist pain   Pertinent Vitals/Pain Pt indicates he can feel fusion screws in ankle with mobility.      Mobility  Bed Mobility Bed Mobility: Not assessed Transfers Transfers: Sit to Stand;Stand to Sit Sit to Stand: 3: Mod assist;With upper extremity assist;From chair/3-in-1 Stand to Sit: 4: Min assist;With upper  extremity assist;To bed Details for Transfer Assistance: cues for use of UEs and A needed to position crutches under arms.   Ambulation/Gait Ambulation/Gait Assistance: 4: Min assist Ambulation Distance (Feet): 30 Feet Assistive device: Crutches Ambulation/Gait Assistance Details: pt leans on crutches secondary to unable to hold them due to RA in hands.  pt generally unsteady and required MinA due to balance throughout mobility.  Cues for gait sequencing, PWBing.   Gait Pattern: Step-to pattern;Decreased step length - right;Decreased stance time - left;Trunk flexed Stairs: No Wheelchair Mobility Wheelchair Mobility: No    Shoulder Instructions     Exercises     PT Diagnosis: Difficulty walking;Acute pain  PT Problem List: Decreased strength;Decreased activity tolerance;Decreased balance;Decreased mobility;Decreased knowledge of use of DME;Pain PT Treatment Interventions: DME instruction;Gait training;Functional mobility training;Therapeutic activities;Therapeutic exercise;Balance training;Patient/family education   PT Goals Acute Rehab PT Goals PT Goal Formulation: With patient Time For Goal Achievement: 09/09/12 Potential to Achieve Goals: Good Pt will go Supine/Side to Sit: with modified independence PT Goal: Supine/Side to Sit - Progress: Goal set today Pt will go Sit to Supine/Side: with modified independence PT Goal: Sit to Supine/Side - Progress: Goal set today Pt will go Sit to Stand: with supervision PT Goal: Sit to Stand - Progress: Goal set today Pt will go Stand to Sit: with supervision PT Goal: Stand to Sit - Progress: Goal set today Pt will Ambulate: 51 - 150 feet;with supervision;with crutches PT Goal: Ambulate - Progress: Goal set today  Visit Information  Last PT Received On: 09/02/12 Assistance Needed: +1  Subjective Data  Subjective: I'll just make it work.   Patient Stated Goal: Home   Prior Functioning  Home Living Lives With: Spouse Available Help  at Discharge: Family;Available 24 hours/day Type of Home: Mobile home Home Access: Ramped entrance Home Layout: One level Bathroom Shower/Tub: Other (comment);Tub/shower unit;Curtain (has been sponge bathing) Bathroom Toilet: Standard Bathroom Accessibility: Yes How Accessible: Other (comment) (via crutches) Home Adaptive Equipment: Walker - Insurance risk surveyor Prior Function Level of Independence: Independent with assistive device(s) Able to Take Stairs?: Yes Driving: Yes Vocation: Retired Musician: No difficulties Dominant Hand: Right    Cognition  Overall Cognitive Status: Appears within functional limits for tasks assessed/performed Arousal/Alertness: Awake/alert Orientation Level: Oriented X4 / Intact Behavior During Session: WFL for tasks performed    Extremity/Trunk Assessment Right Lower Extremity Assessment RLE ROM/Strength/Tone: Deficits RLE ROM/Strength/Tone Deficits: Limited AROM of R foot/ankle secondary to RA, strength grossly 4/5 RLE Sensation: WFL - Light Touch Left Lower Extremity Assessment LLE ROM/Strength/Tone: Deficits LLE ROM/Strength/Tone Deficits: Able to lift LE against gravity, but not formally tested due to pain.   LLE Sensation: WFL - Light Touch Trunk Assessment Trunk Assessment: Kyphotic   Balance Balance Balance Assessed: Yes Static Standing Balance Static Standing - Balance Support: Bilateral upper extremity supported Static Standing - Level of Assistance: 4: Min assist  End of Session PT - End of Session Equipment Utilized During Treatment: Gait belt (L post-op shoe.  ) Activity Tolerance: Patient limited by fatigue;Patient limited by pain Patient left: in bed;with call bell/phone within reach;with family/visitor present (Sitting EOB.  ) Nurse Communication: Mobility status  GP     RitenourAlison Murray, PT [phone] 09/02/2012, 2:52 PM

## 2012-09-02 NOTE — Progress Notes (Signed)
Patient ID: [name], male   DOB: 1946/03/03, 66 y.o.   MRN: [id number] PATIENT ID: [name]        MRN:  [id number]          DOB/AGE: 16-Jun-1946 / 66 y.o.    Fernando Campbell, MD   Jacqualine Code, PA-C [address] [place], [place]  [address]                             [phone]   PROGRESS NOTE  Subjective:  negative for Chest Pain  negative for Shortness of Breath  negative for Nausea/Vomiting   negative for Calf Pain    Tolerating Diet: yes         Patient reports pain as moderate.     Comfortable this am, tough night with pain  Objective: Vital signs in last 24 hours:   Patient Vitals for the past 24 hrs:  BP Temp Temp src Pulse Resp SpO2  09/02/12 0540 116/54 mmHg 98.1 F (36.7 C) - 68  18  99 %  09/02/12 0215 141/66 mmHg 98.9 F (37.2 C) - 79  18  100 %  09/01/12 2056 117/49 mmHg 98.5 F (36.9 C) Oral 72  18  98 %  09/01/12 1850 116/50 mmHg - - 74  - -  09/01/12 1416 151/63 mmHg 98.4 F (36.9 C) - 77  18  99 %  09/01/12 1400 - 98.4 F (36.9 C) - 77  16  -  09/01/12 1345 157/69 mmHg - - 80  17  -  09/01/12 1330 149/70 mmHg - - 81  16  99 %  09/01/12 1315 143/66 mmHg - - 81  17  99 %  09/01/12 1300 151/66 mmHg - - 77  15  99 %  09/01/12 1245 143/63 mmHg - - 76  16  99 %  09/01/12 1230 140/66 mmHg - - 80  15  99 %  09/01/12 1215 141/68 mmHg - - 79  16  99 %  09/01/12 1200 144/64 mmHg - - 77  15  99 %  09/01/12 1145 143/67 mmHg - - 78  16  99 %  09/01/12 1130 148/73 mmHg - - 75  15  100 %  09/01/12 1115 154/80 mmHg - - 78  17  100 %  09/01/12 1100 164/78 mmHg 98.4 F (36.9 C) - 83  16  100 %      Intake/Output from previous day:   12/10 0701 - 12/11 0700 In: 2480 [P.O.:480; I.V.:2000] Out: 1520 [Urine:1500]   Intake/Output this shift:       Intake/Output      12/10 0701 - 12/11 0700 12/11 0701 - 12/12 0700   P.O. 480    I.V. 2000    Total Intake 2480    Urine 1500    Blood 20    Total Output 1520    Net +960            LABORATORY  DATA:  Basename 08/28/12 1453  WBC 12.4*  HGB 13.3  HCT 40.3  PLT 205    Basename 09/02/12 0510 08/28/12 1453  NA 139 140  K 3.6 4.2  CL 103 105  CO2 27 23  BUN 12 19  CREATININE 1.08 1.08  GLUCOSE 127* 146*  CALCIUM 8.3* 9.8   Lab Results  Component Value Date   INR 0.91 08/28/2012   INR 1.0 11/11/2007    Recent  Radiographic Studies :   Chest 2 View  08/28/2012  *RADIOLOGY REPORT*  Clinical Data: Preop for tibial nailing.  Short of breath. Hypertension.  History of CABG.  CHEST - 2 VIEW  Comparison: 03/26/2011  Findings: There are changes from CABG surgery.  The cardiac silhouette is normal in size.  No mediastinal or hilar mass or adenopathy is noted.  The lungs show mild interstitial thickening most prominently at the peripheral bases.  This is stable.  The lungs are otherwise clear.  Old rib fractures are noted on the right.  IMPRESSION: No acute cardiopulmonary disease.   Original Report Authenticated By: Amie Portland, M.D.    Dg Tibia/fibula Left  09/01/2012  *RADIOLOGY REPORT*  Clinical Data: Ankle fixation.  LEFT TIBIA AND FIBULA - 2 VIEW,DG C-ARM GT 120 MIN,LEFT ANKLE COMPLETE - 3+ VIEW  Technique: Five views of the left ankle submitted for review after surgery.  Comparison:  07/13/2012.  Findings: Intramedullary rod has been placed within the left tibia extending through the left tibia - talar articulation with the distal aspect within the left talus.  Proximal and distal fixation screws.  Interruption distal left fibula with gap and slight angulation.  IMPRESSION: Fixation of the left tibia - talar joint space.   Original Report Authenticated By: Lacy Duverney, M.D.    Dg Ankle Complete Left  09/01/2012  *RADIOLOGY REPORT*  Clinical Data: Ankle fixation.  LEFT TIBIA AND FIBULA - 2 VIEW,DG C-ARM GT 120 MIN,LEFT ANKLE COMPLETE - 3+ VIEW  Technique: Five views of the left ankle submitted for review after surgery.  Comparison:  07/13/2012.  Findings: Intramedullary rod has been  placed within the left tibia extending through the left tibia - talar articulation with the distal aspect within the left talus.  Proximal and distal fixation screws.  Interruption distal left fibula with gap and slight angulation.  IMPRESSION: Fixation of the left tibia - talar joint space.   Original Report Authenticated By: Lacy Duverney, M.D.    Dg C-arm Gt 120 Min  09/01/2012  *RADIOLOGY REPORT*  Clinical Data: Ankle fixation.  LEFT TIBIA AND FIBULA - 2 VIEW,DG C-ARM GT 120 MIN,LEFT ANKLE COMPLETE - 3+ VIEW  Technique: Five views of the left ankle submitted for review after surgery.  Comparison:  07/13/2012.  Findings: Intramedullary rod has been placed within the left tibia extending through the left tibia - talar articulation with the distal aspect within the left talus.  Proximal and distal fixation screws.  Interruption distal left fibula with gap and slight angulation.  IMPRESSION: Fixation of the left tibia - talar joint space.   Original Report Authenticated By: Lacy Duverney, M.D.      Examination:  General appearance: alert, cooperative and no distress  Wound Exam: clean, dry, intact   Drainage:  None: wound tissue dry  Motor Exam: EHL, FHL, Anterior Tibial and Posterior Tibial Intact  Sensory Exam: has chronic decreased sensation in deep peroneal nerve distribution diminished  Vascular Exam: Normal  Assessment:    1 Day Post-Op  Procedure(s) (LRB): ANKLE FUSION (Left)  ADDITIONAL DIAGNOSIS:  Active Problems:  * No active hospital problems. *   no new problems   Plan: Physical Therapy as ordered Partial Weight Bearing @ 50% (PWB)  DVT Prophylaxis:  Aspirin  DISCHARGE PLAN: Home  DISCHARGE NEEDS: might need  walker   OOB with PT, D/C foley       Erinn Mendosa W 09/02/2012 7:23 AM

## 2012-09-02 NOTE — Progress Notes (Signed)
Patient ID: [name], male   DOB: 1946/02/20, 66 y.o.   MRN: [id number] PATIENT ID: [name]        MRN:  [id number]          DOB/AGE: 10/08/1945 / 66 y.o.    [name], MD   Jacqualine Code, PA-C [address] [place], [place]  [address]                             [phone]   PROGRESS NOTE  Subjective:  negative for Chest Pain  negative for Shortness of Breath  negative for Nausea/Vomiting, some yesterday but none today  negative for Calf Pain    Tolerating Diet: yes         Patient reports pain as mild.     Comfortable today  Objective: Vital signs in last 24 hours:   Patient Vitals for the past 24 hrs:  BP Temp Temp src Pulse Resp SpO2  09/02/12 0540 116/54 mmHg 98.1 F (36.7 C) - 68  18  99 %  09/02/12 0215 141/66 mmHg 98.9 F (37.2 C) - 79  18  100 %  09/01/12 2056 117/49 mmHg 98.5 F (36.9 C) Oral 72  18  98 %  09/01/12 1850 116/50 mmHg - - 74  - -  09/01/12 1416 151/63 mmHg 98.4 F (36.9 C) - 77  18  99 %  09/01/12 1400 - 98.4 F (36.9 C) - 77  16  -  09/01/12 1345 157/69 mmHg - - 80  17  -  09/01/12 1330 149/70 mmHg - - 81  16  99 %  09/01/12 1315 143/66 mmHg - - 81  17  99 %  09/01/12 1300 151/66 mmHg - - 77  15  99 %  09/01/12 1245 143/63 mmHg - - 76  16  99 %  09/01/12 1230 140/66 mmHg - - 80  15  99 %  09/01/12 1215 141/68 mmHg - - 79  16  99 %  09/01/12 1200 144/64 mmHg - - 77  15  99 %  09/01/12 1145 143/67 mmHg - - 78  16  99 %  09/01/12 1130 148/73 mmHg - - 75  15  100 %  09/01/12 1115 154/80 mmHg - - 78  17  100 %  09/01/12 1100 164/78 mmHg 98.4 F (36.9 C) - 83  16  100 %      Intake/Output from previous day:   12/10 0701 - 12/11 0700 In: 3560 [P.O.:960; I.V.:2600] Out: 2020 [Urine:2000]   Intake/Output this shift:       Intake/Output      12/10 0701 - 12/11 0700 12/11 0701 - 12/12 0700   P.O. 960    I.V. 2600    Total Intake 3560    Urine 2000    Blood 20    Total Output 2020    Net +1540            LABORATORY  DATA:  Basename 08/28/12 1453  WBC 12.4*  HGB 13.3  HCT 40.3  PLT 205    Basename 09/02/12 0510 08/28/12 1453  NA 139 140  K 3.6 4.2  CL 103 105  CO2 27 23  BUN 12 19  CREATININE 1.08 1.08  GLUCOSE 127* 146*  CALCIUM 8.3* 9.8   Lab Results  Component Value Date   INR 0.91 08/28/2012   INR 1.0 11/11/2007    Recent Radiographic  Studies :   Chest 2 View  08/28/2012  *RADIOLOGY REPORT*  Clinical Data: Preop for tibial nailing.  Short of breath. Hypertension.  History of CABG.  CHEST - 2 VIEW  Comparison: 03/26/2011  Findings: There are changes from CABG surgery.  The cardiac silhouette is normal in size.  No mediastinal or hilar mass or adenopathy is noted.  The lungs show mild interstitial thickening most prominently at the peripheral bases.  This is stable.  The lungs are otherwise clear.  Old rib fractures are noted on the right.  IMPRESSION: No acute cardiopulmonary disease.   Original Report Authenticated By: Amie Portland, M.D.    Dg Tibia/fibula Left  09/01/2012  *RADIOLOGY REPORT*  Clinical Data: Ankle fixation.  LEFT TIBIA AND FIBULA - 2 VIEW,DG C-ARM GT 120 MIN,LEFT ANKLE COMPLETE - 3+ VIEW  Technique: Five views of the left ankle submitted for review after surgery.  Comparison:  07/13/2012.  Findings: Intramedullary rod has been placed within the left tibia extending through the left tibia - talar articulation with the distal aspect within the left talus.  Proximal and distal fixation screws.  Interruption distal left fibula with gap and slight angulation.  IMPRESSION: Fixation of the left tibia - talar joint space.   Original Report Authenticated By: Lacy Duverney, M.D.    Dg Ankle Complete Left  09/01/2012  *RADIOLOGY REPORT*  Clinical Data: Ankle fixation.  LEFT TIBIA AND FIBULA - 2 VIEW,DG C-ARM GT 120 MIN,LEFT ANKLE COMPLETE - 3+ VIEW  Technique: Five views of the left ankle submitted for review after surgery.  Comparison:  07/13/2012.  Findings: Intramedullary rod has been  placed within the left tibia extending through the left tibia - talar articulation with the distal aspect within the left talus.  Proximal and distal fixation screws.  Interruption distal left fibula with gap and slight angulation.  IMPRESSION: Fixation of the left tibia - talar joint space.   Original Report Authenticated By: Lacy Duverney, M.D.    Dg C-arm Gt 120 Min  09/01/2012  *RADIOLOGY REPORT*  Clinical Data: Ankle fixation.  LEFT TIBIA AND FIBULA - 2 VIEW,DG C-ARM GT 120 MIN,LEFT ANKLE COMPLETE - 3+ VIEW  Technique: Five views of the left ankle submitted for review after surgery.  Comparison:  07/13/2012.  Findings: Intramedullary rod has been placed within the left tibia extending through the left tibia - talar articulation with the distal aspect within the left talus.  Proximal and distal fixation screws.  Interruption distal left fibula with gap and slight angulation.  IMPRESSION: Fixation of the left tibia - talar joint space.   Original Report Authenticated By: Lacy Duverney, M.D.      Examination:  General appearance: alert, cooperative and mild distress Resp: clear to auscultation bilaterally Cardio: regular rate and rhythm GI: normal findings: bowel sounds normal slight decreased  Wound Exam: clean, dry, intact dressing  Drainage:  None:   Motor Exam: EHL and FHL Intact  Sensory Exam: Superficial Peroneal and Deep Peroneal normal  Vascular Exam: cap refill <2 seconds  Assessment:    1 Day Post-Op  Procedure(s) (LRB): ANKLE FUSION (Left)  ADDITIONAL DIAGNOSIS:  Active Problems:  * No active hospital problems. *      Plan: Physical Therapy as ordered Partial Weight Bearing @ 50% (PWB)  DVT Prophylaxis:  Aspirin  DISCHARGE PLAN: Home  DISCHARGE NEEDS: Walker and 3-in-1 comode seat         PETRARCA,BRIAN 09/02/2012 8:11 AM

## 2012-09-02 NOTE — Progress Notes (Signed)
Rehab Admissions Coordinator Note:  Patient was screened by Clois Dupes for appropriateness for an Inpatient Acute Rehab Consult.  Noted pt may not agree to the possibility of admission. If pt would like [place] to assess for a possible admission, then please order an inpt rehab consult.  Clois Dupes, RN 09/02/2012, 4:32 PM  I can be reached at [phone].

## 2012-09-02 NOTE — Progress Notes (Signed)
Pt c/o 10/10 pain in leg Dorsal paresthesias  Appears generally comfortable lle perfused and sensate No pain with passive pf df toes compts soft to palpation Dorsal paresthesias to palpation  Imp - no compt syndrome Nerve pain s/p im nail Will try neurontin tonight reval am

## 2012-09-02 NOTE — Evaluation (Signed)
Occupational Therapy Evaluation Patient Details Name: [name] MRN: [id number] DOB: [birth year] Today's Date: 09/02/2012 Time: 4098-1191 OT Time Calculation (min): 43 min  OT Assessment / Plan / Recommendation Clinical Impression  Pt admitted for IM nail from calcaneious to tibia for 7 month issue with insufficiency fractures of L tibia/fibia.  Pt is not 50% WB and would benefit from cont OT to increase I with basic adls and adl transfers so he can d/c home with his wife.     OT Assessment  Patient needs continued OT Services    Follow Up Recommendations  Home health OT;Supervision/Assistance - 24 hour    Barriers to Discharge None    Equipment Recommendations  3 in 1 bedside comode;Tub/shower bench    Recommendations for Other Services    Frequency  Min 2X/week    Precautions / Restrictions Precautions Precautions: Fall Required Braces or Orthoses: Other Brace/Splint (L walking boot) Other Brace/Splint: L post op boot Restrictions Weight Bearing Restrictions: Yes LLE Weight Bearing: Partial weight bearing LLE Partial Weight Bearing Percentage or Pounds: 50% Other Position/Activity Restrictions: Pt with RA.   Difficult to use walker due to wrist pain   Pertinent Vitals/Pain Pt with 3/10 pain.  O2 sat at 96% on RA.      ADL  Eating/Feeding: Performed;Set up Where Assessed - Eating/Feeding: Chair Grooming: Performed;Wash/dry face;Wash/dry hands;Teeth care;Set up Where Assessed - Grooming: Unsupported sitting Upper Body Bathing: Simulated;Set up Where Assessed - Upper Body Bathing: Unsupported sitting Lower Body Bathing: Simulated;Moderate assistance Where Assessed - Lower Body Bathing: Supported sit to stand Upper Body Dressing: Simulated;Set up Where Assessed - Upper Body Dressing: Unsupported sitting Lower Body Dressing: Simulated;Moderate assistance Where Assessed - Lower Body Dressing: Supported sit to Pharmacist, hospital: Performed;Moderate assistance Toilet  Transfer Method: Ambulance person: Bedside commode Toileting - Clothing Manipulation and Hygiene: Simulated;Minimal assistance Where Assessed - Toileting Clothing Manipulation and Hygiene: Sit on 3-in-1 or toilet Transfers/Ambulation Related to ADLs: Pt transferred to strong side with mod assist.  Pt always wears shoes when up on feet.  Post op boot is not in room and pt's shoes are at home.  Called family to bring shoes in. ADL Comments: Pt has been dealing with this ankle problem for 7 months now and is very effficient with adl techniques.  Pt would benefit from tub bench.    OT Diagnosis: Generalized weakness;Acute pain  OT Problem List: Decreased strength;Decreased range of motion;Decreased knowledge of use of DME or AE;Decreased knowledge of precautions;Pain;Impaired UE functional use OT Treatment Interventions: Self-care/ADL training;DME and/or AE instruction;Therapeutic activities   OT Goals Acute Rehab OT Goals OT Goal Formulation: With patient Time For Goal Achievement: 09/16/12 Potential to Achieve Goals: Good ADL Goals Pt Will Perform Grooming: with supervision;Standing at sink ADL Goal: Grooming - Progress: Goal set today Pt Will Perform Lower Body Bathing: with min assist;Sit to stand from chair ADL Goal: Lower Body Bathing - Progress: Goal set today Pt Will Perform Lower Body Dressing: with min assist;Sit to stand from chair ADL Goal: Lower Body Dressing - Progress: Goal set today Pt Will Perform Tub/Shower Transfer: Tub transfer;with supervision;Transfer tub bench;Maintaining weight bearing status ADL Goal: Tub/Shower Transfer - Progress: Goal set today Additional ADL Goal #1: Pt will complete all toileting on 3:1 commode over toilet maintaining WB precautions with S. ADL Goal: Additional Goal #1 - Progress: Goal set today  Visit Information  Last OT Received On: 09/02/12 Assistance Needed: +1    Subjective Data  Subjective: "I  feel ok right  now." Patient Stated Goal: to go home   Prior Functioning     Home Living Lives With: Spouse Available Help at Discharge: Family;Available 24 hours/day Type of Home: Mobile home Home Access: Ramped entrance Home Layout: One level Bathroom Shower/Tub: Other (comment);Tub/shower unit;Curtain (has been sponge bathing) Bathroom Toilet: Standard Bathroom Accessibility: Yes How Accessible: Other (comment) (via crutches) Home Adaptive Equipment: Wheelchair - powered;Walker - standard;Crutches Prior Function Level of Independence: Independent with assistive device(s) Able to Take Stairs?: Yes Driving: Yes Vocation: Retired Comments: [place]. Communication Communication: No difficulties Dominant Hand: Right         Vision/Perception Vision - Assessment Eye Alignment: Within Functional Limits Vision Assessment: Vision not tested   Cognition  Overall Cognitive Status: Appears within functional limits for tasks assessed/performed Arousal/Alertness: Awake/alert Orientation Level: Oriented X4 / Intact Behavior During Session: [facility] for tasks performed    Extremity/Trunk Assessment Right Upper Extremity Assessment RUE ROM/Strength/Tone: Deficits RUE ROM/Strength/Tone Deficits: Pt with previous surgery to R shoulder.  ROM WFL but strength 3+/5.  Grip also limited with ROM due to fusion in hand secondary to RA but pt is functional using RUE as dominant hand. RUE Sensation: WFL - Light Touch RUE Coordination: Deficits RUE Coordination Deficits: deficits due to fusion and lack of ROM although functional. Left Upper Extremity Assessment LUE ROM/Strength/Tone: Deficits LUE ROM/Strength/Tone Deficits: Pt with -10 degrees of elbow extension due to swelling and chronic RA issues. LUE Sensation: WFL - Light Touch LUE Coordination: WFL - gross/fine motor Trunk Assessment Trunk Assessment: Normal     Mobility Bed Mobility Bed Mobility: Supine to Sit;Sitting - Scoot to Edge  of Bed Supine to Sit: 4: Min assist;HOB elevated;Other (comment) (HOB at 30 degrees) Sitting - Scoot to Edge of Bed: 5: Supervision Details for Bed Mobility Assistance: Pt needed encouragement to do on his own but easily did so. Transfers Transfers: Sit to Stand;Stand to Sit Sit to Stand: 3: Mod assist;With upper extremity assist;From bed Stand to Sit: 4: Min assist;With upper extremity assist;To chair/3-in-1;With armrests Details for Transfer Assistance: Pt improved to min assist with 3rd attempt at standing.     Shoulder Instructions     Exercise     Balance Balance Balance Assessed: No   End of Session OT - End of Session Activity Tolerance: Patient tolerated treatment well Patient left: in chair;with call bell/phone within reach Nurse Communication: Mobility status;Weight bearing status;Other (comment) (spoke to ortho tech about getting post op boot.)  GO     Hope Budds 09/02/2012, 9:57 AM [phone]

## 2012-09-02 NOTE — Progress Notes (Signed)
Orthopedic Tech Progress Note Patient Details:  [name] 1945/12/15 [id number]  Ortho Devices Type of Ortho Device: Postop shoe/boot Ortho Device/Splint Location: LEFT POST OP SHOE Ortho Device/Splint Interventions: Application   Cammer, Mickie Bail 09/02/2012, 12:47 PM

## 2012-09-03 DIAGNOSIS — S82222K Displaced transverse fracture of shaft of left tibia, subsequent encounter for closed fracture with nonunion: Secondary | ICD-10-CM | POA: Diagnosis present

## 2012-09-03 LAB — CBC
HCT: 39.2 % (ref 39.0–52.0)
MCV: 93.3 fL (ref 78.0–100.0)
RBC: 4.2 MIL/uL — ABNORMAL LOW (ref 4.22–5.81)
RDW: 14.5 % (ref 11.5–15.5)
WBC: 13.7 10*3/uL — ABNORMAL HIGH (ref 4.0–10.5)

## 2012-09-03 LAB — WOUND CULTURE: Culture: NO GROWTH

## 2012-09-03 LAB — COMPREHENSIVE METABOLIC PANEL
Albumin: 3.2 g/dL — ABNORMAL LOW (ref 3.5–5.2)
Alkaline Phosphatase: 80 U/L (ref 39–117)
BUN: 10 mg/dL (ref 6–23)
Calcium: 8.8 mg/dL (ref 8.4–10.5)
Creatinine, Ser: 0.96 mg/dL (ref 0.50–1.35)
GFR calc Af Amer: >90 mL/min (ref >90)
Glucose, Bld: 106 mg/dL — ABNORMAL HIGH (ref 70–99)
Total Protein: 6.9 g/dL (ref 6.0–8.3)

## 2012-09-03 MED ORDER — OXYCODONE HCL 5 MG PO TABS: 5.0000 mg | ORAL_TABLET | ORAL | Status: DC | PRN

## 2012-09-03 NOTE — Progress Notes (Signed)
Occupational Therapy Treatment Patient Details Name: [name] MRN: [id number] DOB: Dec 19, 1945 Today's Date: 09/03/2012 Time: 7425-9563 OT Time Calculation (min): 21 min  OT Assessment / Plan / Recommendation Comments on Treatment Session Pt progressing well. Pt declines tub bench education and plans to sponge bathe. Pt however plans to purchase 3n1 for d/c home    Follow Up Recommendations  Home health OT;Supervision/Assistance - 24 hour    Barriers to Discharge       Equipment Recommendations  None recommended by OT    Recommendations for Other Services    Frequency Min 2X/week   Plan Discharge plan remains appropriate    Precautions / Restrictions Precautions Precautions: Fall Required Braces or Orthoses: Other Brace/Splint Other Brace/Splint: left post op shoe Restrictions LLE Weight Bearing: Partial weight bearing LLE Partial Weight Bearing Percentage or Pounds: 50% PWB   Pertinent Vitals/Pain Minimal pain reported Fatigued reported    ADL  Grooming: Wash/dry hands;Supervision/safety Where Assessed - Grooming: Unsupported standing Toilet Transfer: Min Pension scheme manager Method: Sit to Barista: Raised toilet seat with arms (or 3-in-1 over toilet) (min v/c for safety and educated on sequence to back up) Toileting - Architect and Hygiene: Minimal assistance Where Assessed - Engineer, mining and Hygiene: Sit to stand from 3-in-1 or toilet Equipment Used: Gait belt;Rolling walker (with bil plateforms) Transfers/Ambulation Related to ADLs: pt progressing well with RW and plateforms. Pt educated on 50% PWB as having an egg under the foot and trying not to mash it. Pt states "okay that helps because I am not sure if I am doing 50% or not" ADL Comments: pt educated on the use of 3n1. Wife and son shown 3n1 in bathroom, educated on where to acquire 3n1 in community and cheaper options for home. OT called [facility]  and quote for new 3n1 was 39 dollars plus taxes. PT wife and son where educated on the nearest [facility] to pick up 3n1 for home use. Pt states its nice to sit up to pee then use that bottle. Pt will have wife (A) 24/7 at d/c home    OT Diagnosis:    OT Problem List:   OT Treatment Interventions:     OT Goals Acute Rehab OT Goals OT Goal Formulation: With patient Time For Goal Achievement: 09/16/12 Potential to Achieve Goals: Good ADL Goals Pt Will Perform Grooming: with supervision;Standing at sink ADL Goal: Grooming - Progress: Progressing toward goals Pt Will Perform Lower Body Bathing: with min assist;Sit to stand from chair ADL Goal: Lower Body Bathing - Progress: Progressing toward goals Pt Will Perform Lower Body Dressing: with min assist;Sit to stand from chair Pt Will Perform Tub/Shower Transfer: Tub transfer;with supervision;Transfer tub bench;Maintaining weight bearing status ADL Goal: Tub/Shower Transfer - Progress: Discontinued (comment) Additional ADL Goal #1: Pt will complete all toileting on 3:1 commode over toilet maintaining WB precautions with S. ADL Goal: Additional Goal #1 - Progress: Progressing toward goals  Visit Information  Last OT Received On: 09/03/12 Assistance Needed: +1    Subjective Data      Prior Functioning       Cognition  Overall Cognitive Status: Appears within functional limits for tasks assessed/performed Arousal/Alertness: Awake/alert Orientation Level: Appears intact for tasks assessed Behavior During Session: [facility] for tasks performed    Mobility    Transfers Sit to Stand: With upper extremity assist;From chair/3-in-1;4: Min assist Stand to Sit: 4: Min assist;With upper extremity assist;To chair/3-in-1 Details for Transfer Assistance: min v/c  for hand placment and (A) for sit<>stand from low chair surface       Exercises      Balance     End of Session OT - End of Session Activity Tolerance: Patient tolerated treatment  well Patient left: in chair;with call bell/phone within reach Nurse Communication: Mobility status;Weight bearing status;Other (comment)  GO     Harrel Carina [facility] 09/03/2012, 2:40 PM Pager: [phone]

## 2012-09-03 NOTE — Progress Notes (Signed)
Patient ID: [name], male   DOB: 03-12-1946, 66 y.o.   MRN: [id number] PATIENT ID: [name]        MRN:  [id number]          DOB/AGE: 06/06/1946 / 66 y.o.    [name], MD   Jacqualine Code, PA-C [address] [place], [place]  [address]                             [phone]   PROGRESS NOTE  Subjective:  negative for Chest Pain  negative for Shortness of Breath  negative for Nausea/Vomiting   negative for Calf Pain    Tolerating Diet: yes         Patient reports pain as mild and moderate.     C/O burning pain dorsum of foot which he had previously with casting  Objective: Vital signs in last 24 hours:   Patient Vitals for the past 24 hrs:  BP Temp Temp src Pulse Resp SpO2  09/03/12 0936 - - - 85  - -  09/03/12 0543 150/59 mmHg 99.2 F (37.3 C) - 78  18  96 %  09/02/12 2141 152/59 mmHg 98.9 F (37.2 C) - 77  18  96 %  09/02/12 1435 136/62 mmHg 100 F (37.8 C) Oral 77  18  96 %      Intake/Output from previous day:   12/11 0701 - 12/12 0700 In: 960 [P.O.:960] Out: 2200 [Urine:2200]   Intake/Output this shift:   12/12 0701 - 12/12 1900 In: -  Out: 300 [Urine:300]   Intake/Output      12/11 0701 - 12/12 0700 12/12 0701 - 12/13 0700   P.O. 960    I.V.     Total Intake 960    Urine 2200 300   Blood     Total Output 2200 300   Net -1240 -300        Urine Occurrence 1 x       LABORATORY DATA:  Basename 09/03/12 0725 09/02/12 0846 08/28/12 1453  WBC 13.7* 12.9* 12.4*  HGB 12.6* 11.3* 13.3  HCT 39.2 34.1* 40.3  PLT 153 148* 205    Basename 09/03/12 0725 09/02/12 0510 08/28/12 1453  NA 140 139 140  K 3.1* 3.6 4.2  CL 102 103 105  CO2 26 27 23   BUN 10 12 19   CREATININE 0.96 1.08 1.08  GLUCOSE 106* 127* 146*  CALCIUM 8.8 8.3* 9.8   Lab Results  Component Value Date   INR 0.91 08/28/2012   INR 1.0 11/11/2007    Recent Radiographic Studies :   Chest 2 View  08/28/2012  *RADIOLOGY REPORT*  Clinical Data: Preop for tibial nailing.   Short of breath. Hypertension.  History of CABG.  CHEST - 2 VIEW  Comparison: 03/26/2011  Findings: There are changes from CABG surgery.  The cardiac silhouette is normal in size.  No mediastinal or hilar mass or adenopathy is noted.  The lungs show mild interstitial thickening most prominently at the peripheral bases.  This is stable.  The lungs are otherwise clear.  Old rib fractures are noted on the right.  IMPRESSION: No acute cardiopulmonary disease.   Original Report Authenticated By: Amie Portland, M.D.    Dg Tibia/fibula Left  09/01/2012  *RADIOLOGY REPORT*  Clinical Data: Ankle fixation.  LEFT TIBIA AND FIBULA - 2 VIEW,DG C-ARM GT 120 MIN,LEFT ANKLE COMPLETE - 3+ VIEW  Technique:  Five views of the left ankle submitted for review after surgery.  Comparison:  07/13/2012.  Findings: Intramedullary rod has been placed within the left tibia extending through the left tibia - talar articulation with the distal aspect within the left talus.  Proximal and distal fixation screws.  Interruption distal left fibula with gap and slight angulation.  IMPRESSION: Fixation of the left tibia - talar joint space.   Original Report Authenticated By: Lacy Duverney, M.D.    Dg Ankle Complete Left  09/01/2012  *RADIOLOGY REPORT*  Clinical Data: Ankle fixation.  LEFT TIBIA AND FIBULA - 2 VIEW,DG C-ARM GT 120 MIN,LEFT ANKLE COMPLETE - 3+ VIEW  Technique: Five views of the left ankle submitted for review after surgery.  Comparison:  07/13/2012.  Findings: Intramedullary rod has been placed within the left tibia extending through the left tibia - talar articulation with the distal aspect within the left talus.  Proximal and distal fixation screws.  Interruption distal left fibula with gap and slight angulation.  IMPRESSION: Fixation of the left tibia - talar joint space.   Original Report Authenticated By: Lacy Duverney, M.D.    Dg C-arm Gt 120 Min  09/01/2012  *RADIOLOGY REPORT*  Clinical Data: Ankle fixation.  LEFT TIBIA  AND FIBULA - 2 VIEW,DG C-ARM GT 120 MIN,LEFT ANKLE COMPLETE - 3+ VIEW  Technique: Five views of the left ankle submitted for review after surgery.  Comparison:  07/13/2012.  Findings: Intramedullary rod has been placed within the left tibia extending through the left tibia - talar articulation with the distal aspect within the left talus.  Proximal and distal fixation screws.  Interruption distal left fibula with gap and slight angulation.  IMPRESSION: Fixation of the left tibia - talar joint space.   Original Report Authenticated By: Lacy Duverney, M.D.      Examination:  General appearance: alert, cooperative, mild distress and moderate distress Resp: diminished breath sounds bilaterally but clear and equal Cardio: regular rate and rhythm GI: normal findings: bowel sounds normal  Wound Exam: clean, dry, intact   Drainage:  None: wound tissue dry  Motor Exam: EHL and FHL Intact  Sensory Exam: Superficial Peroneal, Deep Peroneal and Tibial slight decreased but unchanged from preop  Vascular Exam: Left posterior tibial artery has trace pulse  Assessment:    2 Days Post-Op  Procedure(s) (LRB): ANKLE FUSION (Left)  ADDITIONAL DIAGNOSIS:  Principal Problem:  *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

## 2012-09-03 NOTE — Progress Notes (Signed)
[facility]  Patient Status: New  [facility] is providing the following services: PT  If patient discharges after hours, please call [phone].   Wynelle Bourgeois 09/03/2012, 7:36 PM

## 2012-09-03 NOTE — Progress Notes (Signed)
CARE MANAGEMENT NOTE 09/03/2012  Patient:  [name], [name]   Account Number:  [id number]  Date Initiated:  09/03/2012  Documentation initiated by:  Vance Peper  Subjective/Objective Assessment:   67 yr old male s/p Left tib/fib IM Nail.     Action/Plan:   CM spoke with patient concerning home health and DME needs.Patient requested a platform rolling walker because of his RA. Needs 3in1 also. Choice offered for [facility] agency. Wife and son will assist at home.   Anticipated DC Date:  09/04/2012   Anticipated DC Plan:  HOME W HOME HEALTH SERVICES      DC Planning Services  CM consult      PAC Choice  DURABLE MEDICAL EQUIPMENT  HOME HEALTH   Choice offered to / List presented to:  C-1 Patient   DME arranged  WALKER - PLATFORM      DME agency  [facility]     HH arranged  HH-2 PT      [facility] agency  [facility]   Status of service:  Completed, signed off Medicare Important Message given?   (If response is "NO", the following Medicare IM given date fields will be blank) Date Medicare IM given:   Date Additional Medicare IM given:    Discharge Disposition:  HOME W HOME HEALTH SERVICES  Per UR Regulation:    If discussed at Long Length of Stay Meetings, dates discussed:    Comments:

## 2012-09-03 NOTE — Discharge Summary (Signed)
Home [name], MD   Jacqualine Code, PA-C [address] [place], [place]  [address]                             [phone]  PATIENT ID: [name]        MRN:  [id number]          DOB/AGE: Mar 21, 1946 / 66 y.o.    DISCHARGE SUMMARY  ADMISSION DATE:    09/01/2012 DISCHARGE DATE:   09/04/2012   ADMISSION DIAGNOSIS: Non Union Lt. Distal Tibia/Fibula Fracture    DISCHARGE DIAGNOSIS:  Non Union Lt. 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 Rheumatoid arthritis     with nephritis  . Hyperlipidemia   . Hypertension   . PVD (peripheral vascular disease)     Left iliac PCI/stent  . Cerebrovascular disease   . Tobacco abuse, in remission     Remote  . GERD (gastroesophageal reflux disease)   . Hypothyroid   . Allergic rhinitis   . Chronic lung disease     ?  Rheumatoid lung; ?  Adverse reaction to methotrexate  . Cervical spondylosis   . Schatzki's ring   . Hx of Clostridium difficile infection   . Achalasia   . Hiatal hernia   . Coronary artery disease   . Anginal pain     occ  . Heart murmur   . Shortness of breath     occ  . Pneumonia     recent pneumonia  . Diabetes mellitus     borderline no meds  . Stones in the urinary tract     PROCEDURE: Procedure(s): IM NAIL LEFT ANKLE FUSION Left on 09/01/2012  CONSULTS: none     HISTORY: Patient is a 66 y.o. male presented with a history of pain in the left leg for 7 months. Onset of symptoms was gradual starting 7 months ago with gradually worsening pain since that time.  As a point of review he is going on about 7 months post insufficiency fractures of the distal tibia and fibula and, with time, he has had increasing varus position of the fracture. We have tried casting. We have  tried immobilization in an Futures trader. We have tried electrical stimulation. He does have rheumatoid arthritis. He is immunosuppressed. He was on [place] but has not been taking it now for the past 3-4 months and just trying to get along with ibuprofen. 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 He has had a prior ankle fusion years ago and so he really does not have much motion. When I put him in a cast to manipulate the fracture the angulation is probably 10 degrees and perfectly acceptable. But the last time weI had him in a cast he developed excruciating pain in the dorsum of the left foot and I though maybe there was some compression of the foot from the cast. The cast was removed and within 24 hours the pain subsided. He has been in an Futures trader. He certainly has arthritic changes. He has had x-rays of the foot in the past. Dr. Gerda Diss repeated the films and it just shows some degenerative changes consistent with his rheumatoid arthritis. So [name] just has  had a very difficult time with all of this. He continues to have pain and instability at the fracture site.   HOSPITAL COURSE:  [name] is a 66 y.o. admitted on 09/01/2012 and found to have a diagnosis of Non Union Lt. Distal Tibia/Fibula Fracture.  After appropriate laboratory studies were obtained  they were taken to the operating room on 09/01/2012 and underwent  Procedure(s): IM NAIL ANKLE FUSION Left.   They were given perioperative antibiotics:  Anti-infectives     Start     Dose/Rate Route Frequency Ordered Stop   09/01/12 2000   vancomycin (VANCOCIN) IVPB 1000 mg/200 mL premix        1,000 mg 200 mL/hr over 60 Minutes Intravenous Every 12 hours 09/01/12 1431 09/02/12 2122   09/01/12 0000   vancomycin (VANCOCIN) IVPB 1000 mg/200 mL premix        1,000 mg 200 mL/hr over 60 Minutes Intravenous 60 min pre-op 08/31/12 1500 09/01/12 0749        .  Tolerated the procedure well.  Placed with a foley  intraoperatively.  Given Ofirmev at induction and for 48 hours.    POD #1, allowed out of bed to a chair.  PT for ambulation and exercise program.  Foley D/C'd in morning.  IV saline locked.  O2 discontionued.  POD #2, continued PT and ambulation.  Dressing changed. Pain still needed to be controlled better.  POD #3, pain improved.  KCL ordered for hypokalemia.  The remainder of the hospital course was dedicated to ambulation and strengthening.   The patient was discharged on 3 Days Post-Op in  Stable condition.  Blood products given:none  DIAGNOSTIC STUDIES: Recent vital signs:  Patient Vitals for the past 24 hrs:  BP Temp Temp src Pulse Resp SpO2  09/04/12 0520 152/70 mmHg 98.8 F (37.1 C) Oral 68  18  95 %  09/11/2012 2054 110/58 mmHg 98.5 F (36.9 C) Oral 63  - 94 %  2012/09/11 1400 138/65 mmHg 99.4 F (37.4 C) - 69  16  93 %  2012-09-11 0936 - - - 85  - -       Recent laboratory studies:  Basename 09/04/12 0420 September 11, 2012 0725 09/02/12 0846 08/28/12 1453  WBC 12.0* 13.7* 12.9* 12.4*  HGB 11.8* 12.6* 11.3* 13.3  HCT 36.3* 39.2 34.1* 40.3  PLT 147* 153 148* 205    Basename 09/04/12 0420 09-11-12 0725 09/02/12 0510 08/28/12 1453  NA 141 140 139 140  K 3.2* 3.1* 3.6 4.2  CL 103 102 103 105  CO2 22 26 27 23   BUN 13 10 12 19   CREATININE 0.98 0.96 1.08 1.08  GLUCOSE 84 106* 127* 146*  CALCIUM 8.8 8.8 8.3* 9.8   Lab Results  Component Value Date   INR 0.91 08/28/2012   INR 1.0 11/11/2007     Recent Radiographic Studies :   Chest 2 View  08/28/2012  *RADIOLOGY REPORT*  Clinical Data: Preop for tibial nailing.  Short of breath. Hypertension.  History of CABG.  CHEST - 2 VIEW  Comparison: 03/26/2011  Findings: There are changes from CABG surgery.  The cardiac silhouette is normal in size.  No mediastinal or hilar mass or adenopathy is noted.  The lungs show mild interstitial thickening most prominently at the peripheral bases.  This is stable.  The lungs are otherwise clear.   Old rib fractures are noted on the right.  IMPRESSION: No acute cardiopulmonary disease.   Original Report Authenticated By: Onalee Hua  Oleta Mouse, M.D.    Dg Tibia/fibula Left  09/01/2012  *RADIOLOGY REPORT*  Clinical Data: Ankle fixation.  LEFT TIBIA AND FIBULA - 2 VIEW,DG C-ARM GT 120 MIN,LEFT ANKLE COMPLETE - 3+ VIEW  Technique: Five views of the left ankle submitted for review after surgery.  Comparison:  07/13/2012.  Findings: Intramedullary rod has been placed within the left tibia extending through the left tibia - talar articulation with the distal aspect within the left talus.  Proximal and distal fixation screws.  Interruption distal left fibula with gap and slight angulation.  IMPRESSION: Fixation of the left tibia - talar joint space.   Original Report Authenticated By: Lacy Duverney, M.D.    Dg Ankle Complete Left  09/01/2012  *RADIOLOGY REPORT*  Clinical Data: Ankle fixation.  LEFT TIBIA AND FIBULA - 2 VIEW,DG C-ARM GT 120 MIN,LEFT ANKLE COMPLETE - 3+ VIEW  Technique: Five views of the left ankle submitted for review after surgery.  Comparison:  07/13/2012.  Findings: Intramedullary rod has been placed within the left tibia extending through the left tibia - talar articulation with the distal aspect within the left talus.  Proximal and distal fixation screws.  Interruption distal left fibula with gap and slight angulation.  IMPRESSION: Fixation of the left tibia - talar joint space.   Original Report Authenticated By: Lacy Duverney, M.D.    Dg C-arm Gt 120 Min  09/01/2012  *RADIOLOGY REPORT*  Clinical Data: Ankle fixation.  LEFT TIBIA AND FIBULA - 2 VIEW,DG C-ARM GT 120 MIN,LEFT ANKLE COMPLETE - 3+ VIEW  Technique: Five views of the left ankle submitted for review after surgery.  Comparison:  07/13/2012.  Findings: Intramedullary rod has been placed within the left tibia extending through the left tibia - talar articulation with the distal aspect within the left talus.  Proximal and distal fixation  screws.  Interruption distal left fibula with gap and slight angulation.  IMPRESSION: Fixation of the left tibia - talar joint space.   Original Report Authenticated By: Lacy Duverney, M.D.     DISCHARGE INSTRUCTIONS:     Discharge Orders    Future Orders Please Complete By Expires   Diet - low sodium heart healthy      Call MD / Call 911      Comments:   If you experience chest pain or shortness of breath, CALL 911 and be transported to the hospital emergency room.  If you develope a fever above 101 F, pus (white drainage) or increased drainage or redness at the wound, or calf pain, call your surgeon's office.   Constipation Prevention      Comments:   Drink plenty of fluids.  Prune juice may be helpful.  You may use a stool softener, such as Colace (over the counter) 100 mg twice a day.  Use MiraLax (over the counter) for constipation as needed.   Increase activity slowly as tolerated      Patient may shower      Comments:   You may shower without a dressing once there is no drainage.  Do not wash over the wound.  If drainage remains, cover wound with plastic wrap and then shower.   Partial weight bearing      Comments:   50 % WEIGHT BEARING AS TAUGHT IN PHYSICAL THERAPY   Driving restrictions      Comments:   No driving for 6 weeks   Lifting restrictions      Comments:   No lifting for 6 weeks   Change  dressing      Comments:   Change dressing on Saturday, then change the dressing daily with sterile 4 x 4 inch gauze dressing and apply TED hose.  You may clean the incision with alcohol prior to redressing.   Discharge instructions      Comments:   Elevate left leg above level of heart.  May use ice to the area but place a towel between skin and ice pack.      DISCHARGE MEDICATIONS:     Medication List     As of 09/04/2012  8:16 AM    STOP taking these medications         HYDROcodone-acetaminophen 5-325 MG per tablet   Commonly known as: NORCO/VICODIN      ibuprofen  800 MG tablet   Commonly known as: ADVIL,MOTRIN      leflunomide 20 MG tablet   Commonly known as: ARAVA      TAKE these medications         acetaminophen 500 MG tablet   Commonly known as: TYLENOL   Take 1,000 mg by mouth every morning.      ALIGN 4 MG Caps   Take 4 mg by mouth 2 (two) times a week.      aspirin EC 81 MG tablet   Take 81 mg by mouth daily.      atenolol 50 MG tablet   Commonly known as: TENORMIN   Take 1 tablet (50 mg total) by mouth daily.      cholecalciferol 400 UNITS Tabs   Commonly known as: VITAMIN D   Take 400 Units by mouth daily.      colchicine 0.6 MG tablet   Take 0.6 mg by mouth daily.      Fish Oil 500 MG Caps   Take 1 capsule by mouth daily.      folic acid 1 MG tablet   Commonly known as: FOLVITE   Take 1 mg by mouth daily.      FORTEO 600 MCG/2.4ML Soln   Generic drug: Teriparatide (Recombinant)   Inject 2.4 mcg into the skin daily.      levothyroxine 100 MCG tablet   Commonly known as: SYNTHROID, LEVOTHROID   Take 100 mcg by mouth daily.      lisinopril 20 MG tablet   Commonly known as: PRINIVIL,ZESTRIL   Take 20 mg by mouth daily.      oxyCODONE 5 MG immediate release tablet   Commonly known as: Oxy IR/ROXICODONE   Take 1-2 tablets (5-10 mg total) by mouth every 4 (four) hours as needed.      pravastatin 80 MG tablet   Commonly known as: PRAVACHOL   Take 80 mg by mouth daily.      predniSONE 5 MG tablet   Commonly known as: DELTASONE   Take 5 mg by mouth daily. Along with 3mg =8mg       predniSONE 1 MG tablet   Commonly known as: DELTASONE   Take 2 mg by mouth daily. Along with 5mg =8mg       PRILOSEC PO   Take 1 tablet by mouth 2 (two) times daily.      Vitamin D (Ergocalciferol) 50000 UNITS Caps   Commonly known as: DRISDOL   Take 50,000 Units by mouth every 7 (seven) days. Mondays         FOLLOW UP VISIT:   Follow-up Information    Follow up with Valeria Batman, MD. On 09/09/2012.   Contact  information:  [address] [place] [address] [phone]          DISPOSITION:  home  CONDITION:  Stable  PETRARCA,BRIAN 09/04/2012, 8:16 AM

## 2012-09-03 NOTE — Progress Notes (Signed)
Agree with PTA.    Zulema Pulaski, PT [phone]  

## 2012-09-03 NOTE — Progress Notes (Signed)
Physical Therapy Treatment Patient Details Name: [name] MRN: [id number] DOB: December 02, 1945 Today's Date: 09/03/2012 Time: 8295-6213 PT Time Calculation (min): 20 min  PT Assessment / Plan / Recommendation Comments on Treatment Session  Pt with increased steadiness and increased mobility saftey with use of bil platform walker. Has a ramp and motorized scooter as well at home. Pt/family asking about standard toilet and getting a 3N1 or other option. Defering to OT or will address tomrorrow with PT if OT not following pt.                                Follow Up Recommendations  Home health PT;Supervision/Assistance - 24 hour           Equipment Recommendations  Wheelchair (measurements PT);Wheelchair cushion (measurements PT);Other (comment) (3n1)       Frequency Min 5X/week   Plan Discharge plan needs to be updated;Frequency remains appropriate    Precautions / Restrictions Precautions Precautions: Fall Required Braces or Orthoses: Other Brace/Splint Other Brace/Splint: left post op shoe Restrictions LLE Weight Bearing: Partial weight bearing LLE Partial Weight Bearing Percentage or Pounds: 50% PWB       Mobility  Bed Mobility Supine to Sit: 5: Supervision;HOB flat Sitting - Scoot to Edge of Bed: 5: Supervision;With rail Details for Bed Mobility Assistance: min vc's for use of UE'S to assist with trunk transition and pelvic movements towared EOB Transfers Sit to Stand: 4: Min guard;From bed;With upper extremity assist Stand to Sit: 4: Min guard;With upper extremity assist;To chair/3-in-1;With armrests Details for Transfer Assistance: cues for hand position for increased safety with transfers Ambulation/Gait Ambulation/Gait Assistance: 4: Min guard Ambulation Distance (Feet): 30 Feet Assistive device: Bilateral platform walker Ambulation/Gait Assistance Details: min vc's/tc's cues for upright posture, walker postion, and sequence with gait Gait Pattern: Step-to  pattern;Antalgic;Trunk flexed;Decreased step length - left;Decreased stance time - right Gait velocity: Decreased      PT Goals Acute Rehab PT Goals PT Goal: Supine/Side to Sit - Progress: Progressing toward goal PT Goal: Sit to Stand - Progress: Progressing toward goal PT Goal: Stand to Sit - Progress: Progressing toward goal Pt will Ambulate: 51 - 150 feet;with supervision;with other equipment (comment);Other (comment) (bil platform rolling walker) PT Goal: Ambulate - Progress: Progressing toward goal (goal updated today with PT approval)  Visit Information  Last PT Received On: 09/03/12 Assistance Needed: +1    Subjective Data  Subjective: No new complaints, agreeable to therapy at this time and wanting to try the bil PFRW in his room.   Cognition  Overall Cognitive Status: Appears within functional limits for tasks assessed/performed Arousal/Alertness: Awake/alert Orientation Level: Appears intact for tasks assessed       End of Session PT - End of Session Equipment Utilized During Treatment: Gait belt;Other (comment) (post op shoe) Activity Tolerance: Patient tolerated treatment well Patient left: in chair;with call bell/phone within reach;with family/visitor present Nurse Communication: Mobility status   GP     Sallyanne Kuster 09/03/2012, 2:08 PM  Sallyanne Kuster, PTA Office- [phone]

## 2012-09-04 LAB — COMPREHENSIVE METABOLIC PANEL
Albumin: 2.9 g/dL — ABNORMAL LOW (ref 3.5–5.2)
Alkaline Phosphatase: 81 U/L (ref 39–117)
BUN: 13 mg/dL (ref 6–23)
Calcium: 8.8 mg/dL (ref 8.4–10.5)
Creatinine, Ser: 0.98 mg/dL (ref 0.50–1.35)
GFR calc Af Amer: >90 mL/min (ref >90)
Glucose, Bld: 84 mg/dL (ref 70–99)
Potassium: 3.2 mEq/L — ABNORMAL LOW (ref 3.5–5.1)
Total Protein: 6.7 g/dL (ref 6.0–8.3)

## 2012-09-04 LAB — CBC
HCT: 36.3 % — ABNORMAL LOW (ref 39.0–52.0)
RBC: 3.91 MIL/uL — ABNORMAL LOW (ref 4.22–5.81)
RDW: 14.3 % (ref 11.5–15.5)
WBC: 12 10*3/uL — ABNORMAL HIGH (ref 4.0–10.5)

## 2012-09-04 MED ORDER — POTASSIUM CHLORIDE CRYS ER 20 MEQ PO TBCR
40.0000 meq | EXTENDED_RELEASE_TABLET | Freq: Once | ORAL | Status: AC
  Administered 2012-09-04: 40 meq via ORAL
  Filled 2012-09-04: qty 2

## 2012-09-04 NOTE — Progress Notes (Signed)
Patient ID: [name], male   DOB: 1946-02-23, 66 y.o.   MRN: [id number] PATIENT ID: [name]        MRN:  [id number]          DOB/AGE: 01-21-1946 / 66 y.o.    Fernando Campbell, MD   Jacqualine Code, PA-C [address] [place], [place]  [address]                             [phone]   PROGRESS NOTE  Subjective:  negative for Chest Pain  negative for Shortness of Breath  negative for Nausea/Vomiting   negative for Calf Pain    Tolerating Diet: yes         Patient reports pain as mild.     Good night, VS stable,K is low--will give supplement today prior to D/C  Objective: Vital signs in last 24 hours:   Patient Vitals for the past 24 hrs:  BP Temp Temp src Pulse Resp SpO2  09/04/12 0520 152/70 mmHg 98.8 F (37.1 C) Oral 68  18  95 %  09/03/12 2054 110/58 mmHg 98.5 F (36.9 C) Oral 63  - 94 %  09/03/12 1400 138/65 mmHg 99.4 F (37.4 C) - 69  16  93 %  09/03/12 0936 - - - 85  - -      Intake/Output from previous day:   12/12 0701 - 12/13 0700 In: 1200 [P.O.:1200] Out: 1825 [Urine:1825]   Intake/Output this shift:       Intake/Output      12/12 0701 - 12/13 0700 12/13 0701 - 12/14 0700   P.O. 1200    Total Intake 1200    Urine 1825    Total Output 1825    Net -625            LABORATORY DATA:  Basename 09/04/12 0420 09/03/12 0725 09/02/12 0846 08/28/12 1453  WBC 12.0* 13.7* 12.9* 12.4*  HGB 11.8* 12.6* 11.3* 13.3  HCT 36.3* 39.2 34.1* 40.3  PLT 147* 153 148* 205    Basename 09/04/12 0420 09/03/12 0725 09/02/12 0510 08/28/12 1453  NA 141 140 139 140  K 3.2* 3.1* 3.6 4.2  CL 103 102 103 105  CO2 22 26 27 23   BUN 13 10 12 19   CREATININE 0.98 0.96 1.08 1.08  GLUCOSE 84 106* 127* 146*  CALCIUM 8.8 8.8 8.3* 9.8   Lab Results  Component Value Date   INR 0.91 08/28/2012   INR 1.0 11/11/2007    Recent Radiographic Studies :   Chest 2 View  08/28/2012  *RADIOLOGY REPORT*  Clinical Data: Preop for tibial nailing.  Short of breath. Hypertension.   History of CABG.  CHEST - 2 VIEW  Comparison: 03/26/2011  Findings: There are changes from CABG surgery.  The cardiac silhouette is normal in size.  No mediastinal or hilar mass or adenopathy is noted.  The lungs show mild interstitial thickening most prominently at the peripheral bases.  This is stable.  The lungs are otherwise clear.  Old rib fractures are noted on the right.  IMPRESSION: No acute cardiopulmonary disease.   Original Report Authenticated By: Amie Portland, M.D.    Dg Tibia/fibula Left  09/01/2012  *RADIOLOGY REPORT*  Clinical Data: Ankle fixation.  LEFT TIBIA AND FIBULA - 2 VIEW,DG C-ARM GT 120 MIN,LEFT ANKLE COMPLETE - 3+ VIEW  Technique: Five views of the left ankle submitted for review after surgery.  Comparison:  07/13/2012.  Findings: Intramedullary rod has been placed within the left tibia extending through the left tibia - talar articulation with the distal aspect within the left talus.  Proximal and distal fixation screws.  Interruption distal left fibula with gap and slight angulation.  IMPRESSION: Fixation of the left tibia - talar joint space.   Original Report Authenticated By: Lacy Duverney, M.D.    Dg Ankle Complete Left  09/01/2012  *RADIOLOGY REPORT*  Clinical Data: Ankle fixation.  LEFT TIBIA AND FIBULA - 2 VIEW,DG C-ARM GT 120 MIN,LEFT ANKLE COMPLETE - 3+ VIEW  Technique: Five views of the left ankle submitted for review after surgery.  Comparison:  07/13/2012.  Findings: Intramedullary rod has been placed within the left tibia extending through the left tibia - talar articulation with the distal aspect within the left talus.  Proximal and distal fixation screws.  Interruption distal left fibula with gap and slight angulation.  IMPRESSION: Fixation of the left tibia - talar joint space.   Original Report Authenticated By: Lacy Duverney, M.D.    Dg C-arm Gt 120 Min  09/01/2012  *RADIOLOGY REPORT*  Clinical Data: Ankle fixation.  LEFT TIBIA AND FIBULA - 2 VIEW,DG C-ARM GT  120 MIN,LEFT ANKLE COMPLETE - 3+ VIEW  Technique: Five views of the left ankle submitted for review after surgery.  Comparison:  07/13/2012.  Findings: Intramedullary rod has been placed within the left tibia extending through the left tibia - talar articulation with the distal aspect within the left talus.  Proximal and distal fixation screws.  Interruption distal left fibula with gap and slight angulation.  IMPRESSION: Fixation of the left tibia - talar joint space.   Original Report Authenticated By: Lacy Duverney, M.D.      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

## 2012-09-04 NOTE — Progress Notes (Signed)
Physical Therapy Treatment Patient Details Name: [name] MRN: [id number] DOB: 1946/08/11 Today's Date: 09/04/2012 Time: 4098-1191 PT Time Calculation (min): 18 min  PT Assessment / Plan / Recommendation Comments on Treatment Session  pt rpesents with L ankle fusion.  pt much improved today and moving well enough to D/C home with family.  All DME delivered and pt would benefit from HHPT.      Follow Up Recommendations  Home health PT;Supervision/Assistance - 24 hour     Does the patient have the potential to tolerate intense rehabilitation     Barriers to Discharge        Equipment Recommendations  None recommended by PT    Recommendations for Other Services    Frequency Min 5X/week   Plan Discharge plan remains appropriate;Frequency remains appropriate    Precautions / Restrictions Precautions Precautions: Fall Precaution Comments: pt wears special shoes to protect feet.  Needs shoe for R foot and post-op shoe on L.   Required Braces or Orthoses: Other Brace/Splint Other Brace/Splint: L post op shoe Restrictions Weight Bearing Restrictions: Yes LLE Weight Bearing: Partial weight bearing LLE Partial Weight Bearing Percentage or Pounds: 50%   Pertinent Vitals/Pain Indicates only pain is with WBing.      Mobility  Bed Mobility Bed Mobility: Not assessed Transfers Transfers: Sit to Stand;Stand to Sit Sit to Stand: 4: Min guard;With upper extremity assist;From chair/3-in-1;With armrests Stand to Sit: 4: Min guard;With upper extremity assist;To chair/3-in-1;With armrests Details for Transfer Assistance: cues for UE use and to slow down.   Ambulation/Gait Ambulation/Gait Assistance: 4: Min guard Ambulation Distance (Feet): 50 Feet Assistive device: Bilateral platform walker Ambulation/Gait Assistance Details: cues for upright posture, slow down with turns Gait Pattern: Step-to pattern;Antalgic;Trunk flexed;Decreased step length - left;Decreased stance time -  right Stairs: No Wheelchair Mobility Wheelchair Mobility: No    Exercises     PT Diagnosis:    PT Problem List:   PT Treatment Interventions:     PT Goals Acute Rehab PT Goals Time For Goal Achievement: 09/09/12 Potential to Achieve Goals: Good PT Goal: Sit to Stand - Progress: Progressing toward goal PT Goal: Stand to Sit - Progress: Progressing toward goal PT Goal: Ambulate - Progress: Progressing toward goal  Visit Information  Last PT Received On: 09/04/12 Assistance Needed: +1    Subjective Data  Subjective: I get to go home today.     Cognition  Overall Cognitive Status: Appears within functional limits for tasks assessed/performed Arousal/Alertness: Awake/alert Orientation Level: Oriented X4 / Intact Behavior During Session: [facility] for tasks performed    Balance  Balance Balance Assessed: No  End of Session PT - End of Session Equipment Utilized During Treatment: Gait belt;Other (comment) (post op shoe) Activity Tolerance: Patient tolerated treatment well Patient left: in chair;with call bell/phone within reach;with family/visitor present Nurse Communication: Mobility status   GP     Sunny Schlein, [place] [phone] 09/04/2012, 11:45 AM

## 2012-09-04 NOTE — Progress Notes (Signed)
Patient did not want pain medication before being discharged. Patient discharged to home with wife. Discharge instructions given to patient, wife and son. All questions answered. Prescriptions given. Patient discharged to private vehicle via volunteer services.

## 2012-09-06 LAB — ANAEROBIC CULTURE

## 2012-10-13 ENCOUNTER — Encounter: Payer: Self-pay | Admitting: *Deleted

## 2012-10-13 ENCOUNTER — Other Ambulatory Visit: Payer: Self-pay | Admitting: *Deleted

## 2012-10-13 DIAGNOSIS — E782 Mixed hyperlipidemia: Secondary | ICD-10-CM

## 2012-10-26 ENCOUNTER — Encounter: Payer: Self-pay | Admitting: Cardiology

## 2012-10-26 LAB — LIPID PANEL
HDL: 40 mg/dL (ref >39)
Total CHOL/HDL Ratio: 5.6 Ratio
Triglycerides: 591 mg/dL — ABNORMAL HIGH (ref <150)

## 2012-10-27 ENCOUNTER — Other Ambulatory Visit: Payer: Self-pay | Admitting: *Deleted

## 2012-10-27 ENCOUNTER — Encounter: Payer: Self-pay | Admitting: *Deleted

## 2012-10-27 DIAGNOSIS — E782 Mixed hyperlipidemia: Secondary | ICD-10-CM

## 2012-10-27 MED ORDER — PRAVASTATIN SODIUM 80 MG PO TABS: 80.0000 mg | ORAL_TABLET | Freq: Every day | ORAL | Status: DC

## 2012-11-02 ENCOUNTER — Other Ambulatory Visit (HOSPITAL_COMMUNITY): Payer: Self-pay | Admitting: Family Medicine

## 2012-11-02 ENCOUNTER — Ambulatory Visit (HOSPITAL_COMMUNITY)
Admission: RE | Admit: 2012-11-02 | Discharge: 2012-11-02 | Disposition: A | Discharge status: Home / Self Care | Payer: Medicare Other | Source: Ambulatory Visit | Attending: Family Medicine | Admitting: Family Medicine

## 2012-11-02 DIAGNOSIS — M549 Dorsalgia, unspecified: Secondary | ICD-10-CM

## 2012-11-02 DIAGNOSIS — M545 Low back pain, unspecified: Secondary | ICD-10-CM | POA: Insufficient documentation

## 2012-11-02 DIAGNOSIS — M546 Pain in thoracic spine: Secondary | ICD-10-CM | POA: Insufficient documentation

## 2012-11-02 DIAGNOSIS — M47817 Spondylosis without myelopathy or radiculopathy, lumbosacral region: Secondary | ICD-10-CM | POA: Insufficient documentation

## 2012-11-09 ENCOUNTER — Telehealth: Payer: Self-pay | Admitting: Cardiology

## 2012-11-09 NOTE — Telephone Encounter (Signed)
Patient states that he was told by Dr.Rothbart that his Triglycerides were high and changed his meds.  States that Dr.Luking told him that everything looked good. Wants to know what he should do. / tgs

## 2012-11-09 NOTE — Telephone Encounter (Signed)
Clarified with patient results of cholesterol levels and again, advised him of abnormalities and Dr Marvel Plan recommendations.  Offered him a copy of las and he stated he did not want a copy.

## 2012-11-30 ENCOUNTER — Other Ambulatory Visit: Payer: Self-pay | Admitting: Cardiology

## 2012-12-01 LAB — LIPID PANEL: Total CHOL/HDL Ratio: 6.5 Ratio

## 2012-12-02 ENCOUNTER — Encounter: Payer: Self-pay | Admitting: Cardiology

## 2012-12-14 ENCOUNTER — Encounter: Payer: Self-pay | Admitting: *Deleted

## 2012-12-14 ENCOUNTER — Other Ambulatory Visit: Payer: Self-pay | Admitting: *Deleted

## 2012-12-14 DIAGNOSIS — E038 Other specified hypothyroidism: Secondary | ICD-10-CM

## 2012-12-14 MED ORDER — LEVOTHYROXINE SODIUM 100 MCG PO TABS: 100.0000 ug | ORAL_TABLET | Freq: Every day | ORAL | Status: DC

## 2012-12-28 ENCOUNTER — Other Ambulatory Visit: Payer: Self-pay

## 2012-12-28 DIAGNOSIS — I739 Peripheral vascular disease, unspecified: Secondary | ICD-10-CM

## 2012-12-28 DIAGNOSIS — L97909 Non-pressure chronic ulcer of unspecified part of unspecified lower leg with unspecified severity: Secondary | ICD-10-CM

## 2012-12-30 ENCOUNTER — Encounter: Payer: Self-pay | Admitting: Vascular Surgery

## 2012-12-30 ENCOUNTER — Other Ambulatory Visit (HOSPITAL_COMMUNITY): Payer: Self-pay | Admitting: Family Medicine

## 2012-12-30 ENCOUNTER — Other Ambulatory Visit: Payer: Self-pay | Admitting: *Deleted

## 2012-12-30 MED ORDER — TAMSULOSIN HCL 0.4 MG PO CAPS: 0.4000 mg | ORAL_CAPSULE | Freq: Every day | ORAL | Status: DC

## 2012-12-31 ENCOUNTER — Other Ambulatory Visit (INDEPENDENT_AMBULATORY_CARE_PROVIDER_SITE_OTHER): Payer: Medicare Other | Admitting: *Deleted

## 2012-12-31 ENCOUNTER — Encounter (HOSPITAL_COMMUNITY): Payer: Self-pay | Admitting: Pharmacy Technician

## 2012-12-31 ENCOUNTER — Encounter: Payer: Self-pay | Admitting: Vascular Surgery

## 2012-12-31 ENCOUNTER — Other Ambulatory Visit: Payer: Self-pay

## 2012-12-31 ENCOUNTER — Encounter (INDEPENDENT_AMBULATORY_CARE_PROVIDER_SITE_OTHER): Payer: Medicare Other | Admitting: *Deleted

## 2012-12-31 ENCOUNTER — Ambulatory Visit (INDEPENDENT_AMBULATORY_CARE_PROVIDER_SITE_OTHER): Payer: Medicare Other | Admitting: Vascular Surgery

## 2012-12-31 VITALS — BP 122/66 | HR 68 | Ht 68.0 in | Wt 152.8 lb

## 2012-12-31 DIAGNOSIS — I739 Peripheral vascular disease, unspecified: Secondary | ICD-10-CM

## 2012-12-31 DIAGNOSIS — L98499 Non-pressure chronic ulcer of skin of other sites with unspecified severity: Secondary | ICD-10-CM

## 2012-12-31 DIAGNOSIS — L97909 Non-pressure chronic ulcer of unspecified part of unspecified lower leg with unspecified severity: Secondary | ICD-10-CM

## 2012-12-31 DIAGNOSIS — I83893 Varicose veins of bilateral lower extremities with other complications: Secondary | ICD-10-CM

## 2012-12-31 NOTE — Progress Notes (Signed)
[facility] [place] HISTORY AND PHYSICAL   CC: painful left 5th toe  History of Present Illness: [name] is a 67 y.o. male With HX of CAD/CABG, severe RA and know PVD who in December of 2013 had ORIF with tibial rod of a distal tibial fracture by Dr Cleophas Dunker. Pt states post-op he had pain in left great toe which was relieved with "gout medication". Pt states his toes became purple and mottled and he had blister on left small toe which is very painful.  He had left iliac stents placed by Dr. Myra Gianotti in 2009 for claudication and has done well until the ankle fracture. He denies night or rest pain except in small toe on left He also has a small healing ulcer on lateral malleolus  Past Medical History  Diagnosis Date  . 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 Rheumatoid arthritis     with nephritis  . Hyperlipidemia   . Hypertension   . PVD (peripheral vascular disease)     Left iliac PCI/stent  . Cerebrovascular disease   . Tobacco abuse, in remission     Remote  . GERD (gastroesophageal reflux disease)   . Hypothyroid   . Allergic rhinitis   . Chronic lung disease     ?  Rheumatoid lung; ?  Adverse reaction to methotrexate  . Cervical spondylosis   . Schatzki's ring   . Hx of Clostridium difficile infection   . Achalasia   . Hiatal hernia   . Coronary artery disease   . Anginal pain     occ  . Heart murmur   . Shortness of breath     occ  . Pneumonia     recent pneumonia  . Diabetes mellitus     borderline no meds  . Stones in the urinary tract   . Atrial fibrillation   . Chronic kidney disease     ROS: [x]  Positive   [ ]  Denies    General: [ ]  Weight loss, [ ]  Fever, [ ]  chills Neurologic: [ ]  Dizziness, [ ]  Blackouts, [ ]  Seizure [ ]  Stroke, [ ]  "Mini stroke", [ ]  Slurred speech, [ ]  Temporary blindness; [ ]  weakness in arms or legs, [ ]  Hoarseness Cardiac: [ ]  Chest pain/pressure, [ ]  Shortness of breath at  rest [x ] Shortness of breath with exertion, [ ]  Atrial fibrillation or irregular heartbeat Vascular: [ ]  Pain in legs with walking, [ ]  Pain in legs at rest, [ ]  Pain in legs at night,  [x ] Non-healing ulcer, [ ]  Blood clot in vein/DVT,   Pulmonary: [ ]  Home oxygen, [ ]  Productive cough, [ ]  Coughing up blood, [ ]  Asthma,  [ ]  Wheezing Musculoskeletal:  [x ] Arthritis, [x ] Low back pain, [ x] Joint pain Hematologic: [ ]  Easy Bruising, [ ]  Anemia; [ ]  Hepatitis Gastrointestinal: [ ]  Blood in stool, [ ]  Gastroesophageal Reflux/heartburn, [ ]  Trouble swallowing Urinary: [x ] chronic Kidney disease, [ ]  on HD - [ ]  MWF or [ ]  TTHS, [ ]  Burning with urination, [ ]  Difficulty urinating Skin: [ ]  Rashes, [ ]  Wounds Psychological: [ ]  Anxiety, [ ]  Depression  For VQI Use Only    PRE-ADM LIVING: [x ] Home, [ ]  Nursing home, [ ]  Homeless  AMB STATUS: [ ]  Walking, [x ] Walking w/ Assistance, [ ]  Wheelchair, [ ] Bed ridden  RECENT HEART ATTACK (<6 mon): No  CAD Sx: [ x] No, [ ]  Asx, h/o MI, [ ]  Stable angina, [ ]  Unstable angina  PRIOR CHF: [x ] No, [ ]  Asx, [ ]  Mild, [ ]  Moderate, [ ]  Severe  STRESS TEST: [x ] No, [ ]  Normal, [ ]  + ischemia, [ ]  + MI, [ ]  Both      Social History History  Substance Use Topics  . Smoking status: Former Smoker -- 0.50 packs/day for 5 years    Types: Cigarettes    Quit date: 09/23/1965  . Smokeless tobacco: Not on file  . Alcohol Use: No    Family History Family History  Problem Relation Age of Onset  . Stomach cancer Mother 56  . Cancer Mother   . Heart attack Father   . Heart disease Father   . Heart disease Brother   . Leukemia Brother   . Lung disease Son     infant  . Lung disease Daughter     infant    Allergies  Allergen Reactions  . Amoxicillin Hives  . Doxycycline Nausea And Vomiting  . Levofloxacin Nausea And Vomiting    Current Outpatient Prescriptions  Medication Sig Dispense Refill  . acetaminophen (TYLENOL) 500 MG  tablet Take 1,000 mg by mouth every morning.       Marland Kitchen aspirin EC 81 MG tablet Take 81 mg by mouth daily.        Marland Kitchen atenolol (TENORMIN) 50 MG tablet Take 1 tablet (50 mg total) by mouth daily.  30 tablet  6  . cholecalciferol (VITAMIN D) 400 UNITS TABS Take 400 Units by mouth daily.       . colchicine 0.6 MG tablet Take 0.6 mg by mouth daily.      . folic acid (FOLVITE) 1 MG tablet Take 1 mg by mouth daily.        Marland Kitchen HYDROcodone-acetaminophen (NORCO/VICODIN) 5-325 MG per tablet Take 1 tablet by mouth every 6 (six) hours as needed for pain.      Marland Kitchen ibuprofen (ADVIL,MOTRIN) 800 MG tablet Take 800 mg by mouth every 8 (eight) hours as needed for pain.      Marland Kitchen leflunomide (ARAVA) 20 MG tablet Take 20 mg by mouth daily.      Marland Kitchen levothyroxine (SYNTHROID, LEVOTHROID) 100 MCG tablet Take 1 tablet (100 mcg total) by mouth daily.  90 tablet  5  . lisinopril (PRINIVIL,ZESTRIL) 20 MG tablet Take 20 mg by mouth daily.        . Omega-3 Fatty Acids (FISH OIL) 500 MG CAPS Take 1 capsule by mouth daily.      . Omeprazole (PRILOSEC PO) Take 1 tablet by mouth 2 (two) times daily.      . pravastatin (PRAVACHOL) 80 MG tablet Take 1 tablet (80 mg total) by mouth daily.  90 tablet  3  . predniSONE (DELTASONE) 5 MG tablet Take 8 mg by mouth daily. Along with 3mg =8mg       . Teriparatide, Recombinant, (FORTEO) 600 MCG/2.4ML SOLN Inject 2.4 mcg into the skin daily.      . Vitamin D, Ergocalciferol, (DRISDOL) 50000 UNITS CAPS Take 50,000 Units by mouth every 7 (seven) days. 400 units per day, 50000 units per week      . chlorzoxazone (PARAFON) 500 MG tablet TAKE 1 TABLET BY MOUTH THREE TIMES DAILY AS NEEDED FOR SPASMS  60 tablet  3  . oxyCODONE (OXY IR/ROXICODONE) 5 MG immediate release tablet Take 1-2 tablets (5-10 mg total) by mouth every 4 (four) hours  as needed.  90 tablet  0  . Probiotic Product (ALIGN) 4 MG CAPS Take 4 mg by mouth 2 (two) times a week.       . tamsulosin (FLOMAX) 0.4 MG CAPS Take 1 capsule (0.4 mg total) by  mouth at bedtime.  30 capsule  5   No current facility-administered medications for this visit.    Physical Examination  Filed Vitals:   12/31/12 1458  BP: 122/66  Pulse: 68    Body mass index is 23.24 kg/(m^2).  General:  WDWN in NAD Gait: sl limp on left, uses no assistive devices HENT: WNL Eyes: Pupils equal Pulmonary: normal non-labored breathing Cardiac: RRR, No carotid bruits Skin: no rashes, + ulcers noted lateral malleolus no bone exposed Vascular Exam/Pulses: Biphasic flow DP/PT on right, 2+ femoral pulses Monophasic flow on left DP/PT All toes and sole of left foot purple in color, small blister on little toe both feet are warm, toes mottled with extension into forefoot Right foot is pink and normal in color  Left lower Extremities with ischemic changes, Non-Invasive Vascular Imaging: 12/31/2012 Right 1.09 - unchanged Left 1.42 - falsely elevated with monophasic wave forms indicative of severe arterial occlusive disease  ASSESSMENT: [name] is a 67 y.o. male with ischemic left foot and painful left small toe with nonhealing blister. Pt had iliac stent to left CIA/EIA by Dr Myra Gianotti in 2009 PLAN: Aortogram with bilat runoff and possible intervention No dye allergies, not on coumadin or plavix  CR 0.98 in December 2013  History and exam details as above. The patient has chronic ischemia left lower extremity that has been slowly progressive over the last few weeks. He has a limb threatening situation in the left foot at this point. There are nonhealing ulcers as well as duskiness and mottling of the foot. 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 His iliac stent seemed patent by clinical exam. We have scheduled him for aortogram bilateral lower extremity runoff possible intervention tomorrow. Risks benefits possible complications and procedure details were discussed with the patient today including but not  limited to contrast reaction vessel injury bleeding infection. He understands and agrees to proceed.  Fabienne Bruns, MD [facility] [place] Office: [phone] Pager: [phone]

## 2013-01-01 ENCOUNTER — Encounter (HOSPITAL_COMMUNITY)
Admission: RE | Disposition: A | Discharge status: Home / Self Care | Payer: Self-pay | Source: Ambulatory Visit | Attending: Vascular Surgery

## 2013-01-01 ENCOUNTER — Ambulatory Visit (HOSPITAL_COMMUNITY)
Admission: RE | Admit: 2013-01-01 | Discharge: 2013-01-01 | Disposition: A | Discharge status: Home / Self Care | Payer: Medicare Other | Source: Ambulatory Visit | Attending: Vascular Surgery | Admitting: Vascular Surgery

## 2013-01-01 DIAGNOSIS — M069 Rheumatoid arthritis, unspecified: Secondary | ICD-10-CM | POA: Insufficient documentation

## 2013-01-01 DIAGNOSIS — I129 Hypertensive chronic kidney disease with stage 1 through stage 4 chronic kidney disease, or unspecified chronic kidney disease: Secondary | ICD-10-CM | POA: Insufficient documentation

## 2013-01-01 DIAGNOSIS — Z7982 Long term (current) use of aspirin: Secondary | ICD-10-CM | POA: Insufficient documentation

## 2013-01-01 DIAGNOSIS — I4891 Unspecified atrial fibrillation: Secondary | ICD-10-CM | POA: Insufficient documentation

## 2013-01-01 DIAGNOSIS — R011 Cardiac murmur, unspecified: Secondary | ICD-10-CM | POA: Insufficient documentation

## 2013-01-01 DIAGNOSIS — I70209 Unspecified atherosclerosis of native arteries of extremities, unspecified extremity: Secondary | ICD-10-CM | POA: Insufficient documentation

## 2013-01-01 DIAGNOSIS — N189 Chronic kidney disease, unspecified: Secondary | ICD-10-CM | POA: Insufficient documentation

## 2013-01-01 DIAGNOSIS — R7309 Other abnormal glucose: Secondary | ICD-10-CM | POA: Insufficient documentation

## 2013-01-01 DIAGNOSIS — Z87891 Personal history of nicotine dependence: Secondary | ICD-10-CM | POA: Insufficient documentation

## 2013-01-01 DIAGNOSIS — Z882 Allergy status to sulfonamides status: Secondary | ICD-10-CM | POA: Insufficient documentation

## 2013-01-01 DIAGNOSIS — Z79899 Other long term (current) drug therapy: Secondary | ICD-10-CM | POA: Insufficient documentation

## 2013-01-01 DIAGNOSIS — K219 Gastro-esophageal reflux disease without esophagitis: Secondary | ICD-10-CM | POA: Insufficient documentation

## 2013-01-01 DIAGNOSIS — Z8249 Family history of ischemic heart disease and other diseases of the circulatory system: Secondary | ICD-10-CM | POA: Insufficient documentation

## 2013-01-01 DIAGNOSIS — Z880 Allergy status to penicillin: Secondary | ICD-10-CM | POA: Insufficient documentation

## 2013-01-01 DIAGNOSIS — Z883 Allergy status to other anti-infective agents status: Secondary | ICD-10-CM | POA: Insufficient documentation

## 2013-01-01 DIAGNOSIS — I739 Peripheral vascular disease, unspecified: Secondary | ICD-10-CM

## 2013-01-01 DIAGNOSIS — E039 Hypothyroidism, unspecified: Secondary | ICD-10-CM | POA: Insufficient documentation

## 2013-01-01 DIAGNOSIS — E785 Hyperlipidemia, unspecified: Secondary | ICD-10-CM | POA: Insufficient documentation

## 2013-01-01 DIAGNOSIS — IMO0002 Reserved for concepts with insufficient information to code with codable children: Secondary | ICD-10-CM | POA: Insufficient documentation

## 2013-01-01 DIAGNOSIS — J309 Allergic rhinitis, unspecified: Secondary | ICD-10-CM | POA: Insufficient documentation

## 2013-01-01 DIAGNOSIS — I2510 Atherosclerotic heart disease of native coronary artery without angina pectoris: Secondary | ICD-10-CM | POA: Insufficient documentation

## 2013-01-01 DIAGNOSIS — I7092 Chronic total occlusion of artery of the extremities: Secondary | ICD-10-CM | POA: Insufficient documentation

## 2013-01-01 DIAGNOSIS — M79609 Pain in unspecified limb: Secondary | ICD-10-CM | POA: Insufficient documentation

## 2013-01-01 DIAGNOSIS — Z8701 Personal history of pneumonia (recurrent): Secondary | ICD-10-CM | POA: Insufficient documentation

## 2013-01-01 HISTORY — PX: ABDOMINAL AORTAGRAM: SHX5454

## 2013-01-01 LAB — GLUCOSE, CAPILLARY
Glucose-Capillary: 74 mg/dL (ref 70–99)
Glucose-Capillary: 68 mg/dL — ABNORMAL LOW (ref 70–99)
Glucose-Capillary: 61 mg/dL — ABNORMAL LOW (ref 70–99)

## 2013-01-01 LAB — POCT I-STAT, CHEM 8
Hemoglobin: 12.2 g/dL — ABNORMAL LOW (ref 13.0–17.0)
Sodium: 144 mEq/L (ref 135–145)
TCO2: 16 mmol/L (ref 0–100)

## 2013-01-01 SURGERY — ABDOMINAL AORTAGRAM
Anesthesia: LOCAL

## 2013-01-01 MED ORDER — MORPHINE SULFATE 10 MG/ML IJ SOLN: 2.0000 mg | Freq: Once | INTRAMUSCULAR | Status: DC

## 2013-01-01 MED ORDER — HYDROMORPHONE HCL PF 2 MG/ML IJ SOLN
1.0000 mg | INTRAMUSCULAR | Status: DC | PRN
  Administered 2013-01-01 (×2): 1 mg via INTRAVENOUS
  Filled 2013-01-01 (×2): qty 1

## 2013-01-01 MED ORDER — MORPHINE SULFATE 2 MG/ML IJ SOLN
1.0000 mg | INTRAMUSCULAR | Status: DC | PRN
  Administered 2013-01-01: 1 mg via INTRAVENOUS

## 2013-01-01 MED ORDER — MORPHINE SULFATE 2 MG/ML IJ SOLN
INTRAMUSCULAR | Status: AC
  Filled 2013-01-01: qty 1

## 2013-01-01 MED ORDER — ACETAMINOPHEN 325 MG PO TABS: 650.0000 mg | ORAL_TABLET | ORAL | Status: DC | PRN

## 2013-01-01 MED ORDER — SODIUM CHLORIDE 0.9 % IV SOLN: 1.0000 mL/kg/h | INTRAVENOUS | Status: DC

## 2013-01-01 MED ORDER — LIDOCAINE HCL (PF) 1 % IJ SOLN
INTRAMUSCULAR | Status: AC
  Filled 2013-01-01: qty 30

## 2013-01-01 MED ORDER — MORPHINE SULFATE 2 MG/ML IJ SOLN
INTRAMUSCULAR | Status: AC
Start: 2013-01-01 — End: 2013-01-01
  Administered 2013-01-01: 2 mg via INTRAVENOUS
  Filled 2013-01-01: qty 1

## 2013-01-01 MED ORDER — SODIUM CHLORIDE 0.9 % IV SOLN
INTRAVENOUS | Status: DC
  Administered 2013-01-01: 10:00:00 via INTRAVENOUS

## 2013-01-01 MED ORDER — HYDROMORPHONE HCL PF 1 MG/ML IJ SOLN
INTRAMUSCULAR | Status: AC
  Administered 2013-01-01: 1 mg
  Filled 2013-01-01: qty 1

## 2013-01-01 MED ORDER — MORPHINE SULFATE 2 MG/ML IJ SOLN
INTRAMUSCULAR | Status: AC
  Administered 2013-01-01: 2 mg via INTRAVENOUS
  Filled 2013-01-01: qty 1

## 2013-01-01 MED ORDER — HEPARIN (PORCINE) IN NACL 2-0.9 UNIT/ML-% IJ SOLN
INTRAMUSCULAR | Status: AC
  Filled 2013-01-01: qty 1000

## 2013-01-01 MED ORDER — ONDANSETRON HCL 4 MG/2ML IJ SOLN: 4.0000 mg | Freq: Four times a day (QID) | INTRAMUSCULAR | Status: DC | PRN

## 2013-01-01 NOTE — H&P (View-Only) (Signed)
[facility] [place] HISTORY AND PHYSICAL   CC: painful left 5th toe  History of Present Illness: [name] is a 66 y.o. male With HX of CAD/CABG, severe RA and know PVD who in December of 2013 had ORIF with tibial rod of a distal tibial fracture by Dr Whitfield. Pt states post-op he had pain in left great toe which was relieved with "gout medication". Pt states his toes became purple and mottled and he had blister on left small toe which is very painful.  He had left iliac stents placed by Dr. Brabham in 2009 for claudication and has done well until the ankle fracture. He denies night or rest pain except in small toe on left He also has a small healing ulcer on lateral malleolus  Past Medical History  Diagnosis Date  . 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 Rheumatoid arthritis     with nephritis  . Hyperlipidemia   . Hypertension   . PVD (peripheral vascular disease)     Left iliac PCI/stent  . Cerebrovascular disease   . Tobacco abuse, in remission     Remote  . GERD (gastroesophageal reflux disease)   . Hypothyroid   . Allergic rhinitis   . Chronic lung disease     ?  Rheumatoid lung; ?  Adverse reaction to methotrexate  . Cervical spondylosis   . Schatzki's ring   . Hx of Clostridium difficile infection   . Achalasia   . Hiatal hernia   . Coronary artery disease   . Anginal pain     occ  . Heart murmur   . Shortness of breath     occ  . Pneumonia     recent pneumonia  . Diabetes mellitus     borderline no meds  . Stones in the urinary tract   . Atrial fibrillation   . Chronic kidney disease     ROS: [x] Positive   [ ] Denies    General: [ ] Weight loss, [ ] Fever, [ ] chills Neurologic: [ ] Dizziness, [ ] Blackouts, [ ] Seizure [ ] Stroke, [ ] "Mini stroke", [ ] Slurred speech, [ ] Temporary blindness; [ ] weakness in arms or legs, [ ] Hoarseness Cardiac: [ ] Chest pain/pressure, [ ] Shortness of breath at  rest [x ] Shortness of breath with exertion, [ ] Atrial fibrillation or irregular heartbeat Vascular: [ ] Pain in legs with walking, [ ] Pain in legs at rest, [ ] Pain in legs at night,  [x ] Non-healing ulcer, [ ] Blood clot in vein/DVT,   Pulmonary: [ ] Home oxygen, [ ] Productive cough, [ ] Coughing up blood, [ ] Asthma,  [ ] Wheezing Musculoskeletal:  [x ] Arthritis, [x ] Low back pain, [ x] Joint pain Hematologic: [ ] Easy Bruising, [ ] Anemia; [ ] Hepatitis Gastrointestinal: [ ] Blood in stool, [ ] Gastroesophageal Reflux/heartburn, [ ] Trouble swallowing Urinary: [x ] chronic Kidney disease, [ ] on HD - [ ] MWF or [ ] TTHS, [ ] Burning with urination, [ ] Difficulty urinating Skin: [ ] Rashes, [ ] Wounds Psychological: [ ] Anxiety, [ ] Depression  For VQI Use Only    PRE-ADM LIVING: [x ] Home, [ ] Nursing home, [ ] Homeless  AMB STATUS: [ ] Walking, [x ] Walking w/ Assistance, [ ] Wheelchair, [ ]Bed ridden  RECENT HEART ATTACK (<6 mon): No    CAD Sx: [ x] No, [ ] Asx, h/o MI, [ ] Stable angina, [ ] Unstable angina  PRIOR CHF: [x ] No, [ ] Asx, [ ] Mild, [ ] Moderate, [ ] Severe  STRESS TEST: [x ] No, [ ] Normal, [ ] + ischemia, [ ] + MI, [ ] Both      Social History History  Substance Use Topics  . Smoking status: Former Smoker -- 0.50 packs/day for 5 years    Types: Cigarettes    Quit date: 09/23/1965  . Smokeless tobacco: Not on file  . Alcohol Use: No    Family History Family History  Problem Relation Age of Onset  . Stomach cancer Mother 48  . Cancer Mother   . Heart attack Father   . Heart disease Father   . Heart disease Brother   . Leukemia Brother   . Lung disease Son     infant  . Lung disease Daughter     infant    Allergies  Allergen Reactions  . Amoxicillin Hives  . Doxycycline Nausea And Vomiting  . Levofloxacin Nausea And Vomiting    Current Outpatient Prescriptions  Medication Sig Dispense Refill  . acetaminophen (TYLENOL) 500 MG  tablet Take 1,000 mg by mouth every morning.       . aspirin EC 81 MG tablet Take 81 mg by mouth daily.        . atenolol (TENORMIN) 50 MG tablet Take 1 tablet (50 mg total) by mouth daily.  30 tablet  6  . cholecalciferol (VITAMIN D) 400 UNITS TABS Take 400 Units by mouth daily.       . colchicine 0.6 MG tablet Take 0.6 mg by mouth daily.      . folic acid (FOLVITE) 1 MG tablet Take 1 mg by mouth daily.        . HYDROcodone-acetaminophen (NORCO/VICODIN) 5-325 MG per tablet Take 1 tablet by mouth every 6 (six) hours as needed for pain.      . ibuprofen (ADVIL,MOTRIN) 800 MG tablet Take 800 mg by mouth every 8 (eight) hours as needed for pain.      . leflunomide (ARAVA) 20 MG tablet Take 20 mg by mouth daily.      . levothyroxine (SYNTHROID, LEVOTHROID) 100 MCG tablet Take 1 tablet (100 mcg total) by mouth daily.  90 tablet  5  . lisinopril (PRINIVIL,ZESTRIL) 20 MG tablet Take 20 mg by mouth daily.        . Omega-3 Fatty Acids (FISH OIL) 500 MG CAPS Take 1 capsule by mouth daily.      . Omeprazole (PRILOSEC PO) Take 1 tablet by mouth 2 (two) times daily.      . pravastatin (PRAVACHOL) 80 MG tablet Take 1 tablet (80 mg total) by mouth daily.  90 tablet  3  . predniSONE (DELTASONE) 5 MG tablet Take 8 mg by mouth daily. Along with 3mg=8mg      . Teriparatide, Recombinant, (FORTEO) 600 MCG/2.4ML SOLN Inject 2.4 mcg into the skin daily.      . Vitamin D, Ergocalciferol, (DRISDOL) 50000 UNITS CAPS Take 50,000 Units by mouth every 7 (seven) days. 400 units per day, 50000 units per week      . chlorzoxazone (PARAFON) 500 MG tablet TAKE 1 TABLET BY MOUTH THREE TIMES DAILY AS NEEDED FOR SPASMS  60 tablet  3  . oxyCODONE (OXY IR/ROXICODONE) 5 MG immediate release tablet Take 1-2 tablets (5-10 mg total) by mouth every 4 (four) hours   as needed.  90 tablet  0  . Probiotic Product (ALIGN) 4 MG CAPS Take 4 mg by mouth 2 (two) times a week.       . tamsulosin (FLOMAX) 0.4 MG CAPS Take 1 capsule (0.4 mg total) by  mouth at bedtime.  30 capsule  5   No current facility-administered medications for this visit.    Physical Examination  Filed Vitals:   12/31/12 1458  BP: 122/66  Pulse: 68    Body mass index is 23.24 kg/(m^2).  General:  WDWN in NAD Gait: sl limp on left, uses no assistive devices HENT: WNL Eyes: Pupils equal Pulmonary: normal non-labored breathing Cardiac: RRR, No carotid bruits Skin: no rashes, + ulcers noted lateral malleolus no bone exposed Vascular Exam/Pulses: Biphasic flow DP/PT on right, 2+ femoral pulses Monophasic flow on left DP/PT All toes and sole of left foot purple in color, small blister on little toe both feet are warm, toes mottled with extension into forefoot Right foot is pink and normal in color  Left lower Extremities with ischemic changes, Non-Invasive Vascular Imaging: 12/31/2012 Right 1.09 - unchanged Left 1.42 - falsely elevated with monophasic wave forms indicative of severe arterial occlusive disease  ASSESSMENT: [name] is a 66 y.o. male with ischemic left foot and painful left small toe with nonhealing blister. Pt had iliac stent to left CIA/EIA by Dr Brabham in 2009 PLAN: Aortogram with bilat runoff and possible intervention No dye allergies, not on coumadin or plavix  CR 0.98 in December 2013  History and exam details as above. The patient has chronic ischemia left lower extremity that has been slowly progressive over the last few weeks. He has a limb threatening situation in the left foot at this point. There are nonhealing ulcers as well as duskiness and mottling of the foot. 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 His iliac stent seemed patent by clinical exam. We have scheduled him for aortogram bilateral lower extremity runoff possible intervention tomorrow. Risks benefits possible complications and procedure details were discussed with the patient today including but not  limited to contrast reaction vessel injury bleeding infection. He understands and agrees to proceed.  Afra Tricarico, MD [facility] Office: [phone] Pager: [phone]   

## 2013-01-01 NOTE — Interval H&P Note (Signed)
History and Physical Interval Note:  01/01/2013 11:51 AM  [name]  has presented today for surgery, with the diagnosis of PVD  The various methods of treatment have been discussed with the patient and family. After consideration of risks, benefits and other options for treatment, the patient has consented to  Procedure(s): ABDOMINAL AORTAGRAM (N/A) as a surgical intervention .  The patient's history has been reviewed, patient examined, no change in status, stable for surgery.  I have reviewed the patient's chart and labs.  Questions were answered to the patient's satisfaction.     Josephina Gip

## 2013-01-01 NOTE — Op Note (Signed)
OPERATIVE REPORT  Date of Surgery: 01/01/2013  Surgeon: Josephina Gip, MD  Assistant: Nurse Pre-op Diagnosis: PVD with ischemia left foot Post-op Diagnosis: Same  Procedure: Procedure(s): ABDOMINAL AORTAGRAM with selective second order catheterization left external iliac artery and left lower extremity angiogram Anesthesia: General  EBL: None Contrast 95 cc  Complications: None  Patient was taken to the [facility]   endovascular suite and placed in the supine position. Both inguinal areas were prepped and draped in routine sterile manner. After an appropriate timeout was called the right common femoral artery was entered percutaneously after infiltration with 1% Xylocaine. Guidewire was passed into the suprarenal aorta under fluoroscopic guidance. 5 French sheath and dilator were passed over the guidewire dilator removed and a standard pigtail catheter positioned in the supra-renal aorta. Abdominal aortogram was then performed injecting 20 cc of contrast at 20 cc per second. This revealed the aorta to be widely patent single renal arteries bilaterally which had no area of stenosis. The right common iliac system had some mild/moderate plaque formation was calcific. Causing a mild stenosis of the external and internal iliac arteries being widely patent. On the left side there was a common iliac and external iliac stent which had been placed previously and the left iliac system was widely patent with a widely patent left internal iliac artery. Following this pigtail catheter was removed over the guidewire the left iliac system entered using a crossover catheter guidewire advanced into the left common femoral artery and an end hole catheter positioned in the left common femoral artery. Left lower extremity angiograms were then performed through the catheter using stations. The left common superficial and profunda femoris arteries were widely patent. The left superficial femoral artery became severely  diseased with calcific plaque at the abductor canal which then caused total occlusion. There was reconstitution of a diseased below knee popliteal artery. Anterior tibial artery was severely diseased but was patent the proximal third of the leg reconstituting distally and it was also significantly diseased in the lower third of the leg particularly on the dorsum of the foot. The posterior tibial artery was severely diseased proximally but became a large relatively normal appearing vessel and the mid calf extending down posterior to the medial malleolus. peroneal artery was patent but was small its diseased throughout. End hole catheter was then removed sheath removed and compression applied no complications inserted  Findings-#1 widely patent aorta and single widely patent renal arteries bilaterally.  #2 calcific plaque formation in right common iliac artery causing mild stenosis #3 widely patent left iliac system with widely patent left common and external iliac artery stents #4 widely patent left common femoral profunda femoris and superficial femoral artery down to abductor canal #5 occlusion of distal SFA and proximal popliteal artery with reconstitution of below knee popliteal artery-diseased #6 severe tibial occlusive disease with best vessel being the posterior tibial artery from mid calf to foot #7 diffuse severe disease throughout peroneal artery and anterior tibial artery Procedure Details:   Josephina Gip, MD 01/01/2013 12:34 PM

## 2013-01-04 ENCOUNTER — Encounter: Payer: Self-pay | Admitting: Family Medicine

## 2013-01-04 ENCOUNTER — Ambulatory Visit (INDEPENDENT_AMBULATORY_CARE_PROVIDER_SITE_OTHER): Payer: Medicare Other | Admitting: Family Medicine

## 2013-01-04 ENCOUNTER — Other Ambulatory Visit: Payer: Self-pay | Admitting: Family Medicine

## 2013-01-04 VITALS — BP 138/88 | Wt 152.2 lb

## 2013-01-04 DIAGNOSIS — L2089 Other atopic dermatitis: Secondary | ICD-10-CM

## 2013-01-04 DIAGNOSIS — L209 Atopic dermatitis, unspecified: Secondary | ICD-10-CM

## 2013-01-04 DIAGNOSIS — B356 Tinea cruris: Secondary | ICD-10-CM

## 2013-01-04 LAB — PSA: PSA: 0.71 ng/mL (ref <=4.00)

## 2013-01-04 MED ORDER — TRIAMCINOLONE 0.1 % CREAM:EUCERIN CREAM 1:1: 1.0000 "application " | TOPICAL_CREAM | Freq: Two times a day (BID) | CUTANEOUS | Status: DC

## 2013-01-04 MED ORDER — HYDROCODONE-ACETAMINOPHEN 10-325 MG PO TABS: 1.0000 | ORAL_TABLET | ORAL | Status: DC | PRN

## 2013-01-04 MED ORDER — KETOCONAZOLE 2 % EX CREA: TOPICAL_CREAM | Freq: Two times a day (BID) | CUTANEOUS | Status: DC

## 2013-01-04 NOTE — Patient Instructions (Signed)
Use triamcinolone on leg rash 2 times a day  Use ketoconazole apply 2 times a day to groin rash  Use stronger pain med for foot. Let me know if problems

## 2013-01-05 NOTE — Progress Notes (Signed)
  Subjective:    Patient ID: [name], male    DOB: 11/19/1945, 67 y.o.   MRN: [id number]  HPI Patient presents with a rash he has an excoriated red area in the groin crease on the left side he also has a most purpura-like rash with some scaling on the left lower leg he stated it started soon after having a catheterization in his leg. The catheterization showed that he has poor blood supply from the knee down and is probably facing surgery. He also relates that his PSA has been elevated and that's been stressing him as well he denies any fever chills sweats vomiting denies any change in appetite urination or bowel movements.   Review of Systemssee above.     Objective:   Physical Exam Lungs are clear abdomen soft groin area has excoriated red area of the papular rash on the lower leg has a couple areas that have purpura-like appearance but no swelling in the legs in the color down to his calf is normal. Muscles nontender in the upper thigh.       Assessment & Plan:  Tinea rash-East related Nizoral cream as directed several times a day over the next 2 weeks Unexplained rash lower leg may try Kenalog cream if that doesn't help followup. Patient was told the PSA will be called to him if he has not heard from [place] in a week give [place] a call He will followup with vascular surgeon regarding his leg.

## 2013-01-06 ENCOUNTER — Other Ambulatory Visit (INDEPENDENT_AMBULATORY_CARE_PROVIDER_SITE_OTHER): Payer: Medicare Other | Admitting: Vascular Surgery

## 2013-01-06 ENCOUNTER — Encounter (INDEPENDENT_AMBULATORY_CARE_PROVIDER_SITE_OTHER): Payer: Medicare Other | Admitting: Vascular Surgery

## 2013-01-06 ENCOUNTER — Telehealth: Payer: Self-pay

## 2013-01-06 DIAGNOSIS — Z01810 Encounter for preprocedural cardiovascular examination: Secondary | ICD-10-CM

## 2013-01-06 DIAGNOSIS — I739 Peripheral vascular disease, unspecified: Secondary | ICD-10-CM

## 2013-01-06 DIAGNOSIS — I6529 Occlusion and stenosis of unspecified carotid artery: Secondary | ICD-10-CM

## 2013-01-06 NOTE — Telephone Encounter (Signed)
Appointment scheduled 01/06/13@ 2:30pm

## 2013-01-06 NOTE — Telephone Encounter (Signed)
Rec'd verbal order from Dr. Myra Gianotti to schedule pt. for bilateral lower extremity vein mapping and bilateral carotid duplex, as pre-op work-up, prior to lower extremity bypass.  Per Dr. Myra Gianotti, attempt to schedule vascular studies this week, and notify me when studies are completed.  We will tentatively plan for scheduling lower extrem. bypass next week.

## 2013-01-07 ENCOUNTER — Encounter: Payer: Self-pay | Admitting: Family Medicine

## 2013-01-08 ENCOUNTER — Emergency Department (HOSPITAL_COMMUNITY)
Admission: EM | Admit: 2013-01-08 | Discharge: 2013-01-08 | Disposition: A | Discharge status: Home / Self Care | Payer: Medicare Other | Source: Home / Self Care | Attending: Emergency Medicine | Admitting: Emergency Medicine

## 2013-01-08 ENCOUNTER — Emergency Department (HOSPITAL_COMMUNITY): Payer: Medicare Other

## 2013-01-08 ENCOUNTER — Encounter (HOSPITAL_COMMUNITY): Payer: Self-pay

## 2013-01-08 DIAGNOSIS — Z951 Presence of aortocoronary bypass graft: Secondary | ICD-10-CM | POA: Insufficient documentation

## 2013-01-08 DIAGNOSIS — Z8679 Personal history of other diseases of the circulatory system: Secondary | ICD-10-CM | POA: Insufficient documentation

## 2013-01-08 DIAGNOSIS — N390 Urinary tract infection, site not specified: Secondary | ICD-10-CM

## 2013-01-08 DIAGNOSIS — Z9889 Other specified postprocedural states: Secondary | ICD-10-CM | POA: Insufficient documentation

## 2013-01-08 DIAGNOSIS — G8929 Other chronic pain: Secondary | ICD-10-CM

## 2013-01-08 DIAGNOSIS — N189 Chronic kidney disease, unspecified: Secondary | ICD-10-CM | POA: Insufficient documentation

## 2013-01-08 DIAGNOSIS — Z79899 Other long term (current) drug therapy: Secondary | ICD-10-CM | POA: Insufficient documentation

## 2013-01-08 DIAGNOSIS — I129 Hypertensive chronic kidney disease with stage 1 through stage 4 chronic kidney disease, or unspecified chronic kidney disease: Secondary | ICD-10-CM | POA: Insufficient documentation

## 2013-01-08 DIAGNOSIS — I2510 Atherosclerotic heart disease of native coronary artery without angina pectoris: Secondary | ICD-10-CM | POA: Insufficient documentation

## 2013-01-08 DIAGNOSIS — Z8719 Personal history of other diseases of the digestive system: Secondary | ICD-10-CM | POA: Insufficient documentation

## 2013-01-08 DIAGNOSIS — Z8701 Personal history of pneumonia (recurrent): Secondary | ICD-10-CM | POA: Insufficient documentation

## 2013-01-08 DIAGNOSIS — M51379 Other intervertebral disc degeneration, lumbosacral region without mention of lumbar back pain or lower extremity pain: Secondary | ICD-10-CM | POA: Insufficient documentation

## 2013-01-08 DIAGNOSIS — M545 Low back pain, unspecified: Secondary | ICD-10-CM | POA: Insufficient documentation

## 2013-01-08 DIAGNOSIS — Z8673 Personal history of transient ischemic attack (TIA), and cerebral infarction without residual deficits: Secondary | ICD-10-CM | POA: Insufficient documentation

## 2013-01-08 DIAGNOSIS — Z7982 Long term (current) use of aspirin: Secondary | ICD-10-CM | POA: Insufficient documentation

## 2013-01-08 DIAGNOSIS — Z8739 Personal history of other diseases of the musculoskeletal system and connective tissue: Secondary | ICD-10-CM | POA: Insufficient documentation

## 2013-01-08 DIAGNOSIS — Z87738 Personal history of other specified (corrected) congenital malformations of digestive system: Secondary | ICD-10-CM | POA: Insufficient documentation

## 2013-01-08 DIAGNOSIS — Z8709 Personal history of other diseases of the respiratory system: Secondary | ICD-10-CM | POA: Insufficient documentation

## 2013-01-08 DIAGNOSIS — E039 Hypothyroidism, unspecified: Secondary | ICD-10-CM | POA: Insufficient documentation

## 2013-01-08 DIAGNOSIS — Z87448 Personal history of other diseases of urinary system: Secondary | ICD-10-CM | POA: Insufficient documentation

## 2013-01-08 DIAGNOSIS — Z87891 Personal history of nicotine dependence: Secondary | ICD-10-CM | POA: Insufficient documentation

## 2013-01-08 DIAGNOSIS — R011 Cardiac murmur, unspecified: Secondary | ICD-10-CM | POA: Insufficient documentation

## 2013-01-08 DIAGNOSIS — M5137 Other intervertebral disc degeneration, lumbosacral region: Secondary | ICD-10-CM | POA: Insufficient documentation

## 2013-01-08 DIAGNOSIS — E785 Hyperlipidemia, unspecified: Secondary | ICD-10-CM | POA: Insufficient documentation

## 2013-01-08 DIAGNOSIS — M5136 Other intervertebral disc degeneration, lumbar region: Secondary | ICD-10-CM

## 2013-01-08 DIAGNOSIS — K219 Gastro-esophageal reflux disease without esophagitis: Secondary | ICD-10-CM | POA: Insufficient documentation

## 2013-01-08 DIAGNOSIS — R52 Pain, unspecified: Secondary | ICD-10-CM | POA: Insufficient documentation

## 2013-01-08 DIAGNOSIS — Z87442 Personal history of urinary calculi: Secondary | ICD-10-CM | POA: Insufficient documentation

## 2013-01-08 DIAGNOSIS — Z8619 Personal history of other infectious and parasitic diseases: Secondary | ICD-10-CM | POA: Insufficient documentation

## 2013-01-08 HISTORY — DX: Other intervertebral disc degeneration, lumbar region: M51.36

## 2013-01-08 HISTORY — DX: Other intervertebral disc degeneration, lumbar region without mention of lumbar back pain or lower extremity pain: M51.369

## 2013-01-08 HISTORY — DX: Dorsalgia, unspecified: M54.9

## 2013-01-08 HISTORY — DX: Other chronic pain: G89.29

## 2013-01-08 LAB — URINALYSIS, ROUTINE W REFLEX MICROSCOPIC
Bilirubin Urine: NEGATIVE
Glucose, UA: NEGATIVE mg/dL
Ketones, ur: NEGATIVE mg/dL
Nitrite: NEGATIVE
Specific Gravity, Urine: 1.02 (ref 1.005–1.030)
pH: 6 (ref 5.0–8.0)

## 2013-01-08 LAB — URINE MICROSCOPIC-ADD ON

## 2013-01-08 MED ORDER — MORPHINE SULFATE 4 MG/ML IJ SOLN
4.0000 mg | Freq: Once | INTRAMUSCULAR | Status: AC
  Administered 2013-01-08: 4 mg via INTRAMUSCULAR
  Filled 2013-01-08: qty 1

## 2013-01-08 MED ORDER — CIPROFLOXACIN HCL 500 MG PO TABS: 500.0000 mg | ORAL_TABLET | Freq: Two times a day (BID) | ORAL | Status: DC

## 2013-01-08 MED ORDER — NITROFURANTOIN MONOHYD MACRO 100 MG PO CAPS: 100.0000 mg | ORAL_CAPSULE | Freq: Two times a day (BID) | ORAL | Status: DC

## 2013-01-08 MED ORDER — OXYCODONE-ACETAMINOPHEN 5-325 MG PO TABS: ORAL_TABLET | ORAL | Status: DC

## 2013-01-08 NOTE — ED Notes (Signed)
C/o lower back pain rt hip after having left atrial gram 7 days ago. Pt had difficulty transferring from wheelchair to bed. Denies any difficulty with gi/gu

## 2013-01-08 NOTE — ED Notes (Signed)
nad noted prior to dc. Dc instructions reviewed. 1 script given pt.  

## 2013-01-08 NOTE — ED Provider Notes (Signed)
History     CSN: [id number]  Arrival date & time 01/08/13  1125   First MD Initiated Contact with Patient 01/08/13 1144      Chief Complaint  Patient presents with  . Back Pain  . Leg Pain     HPI Pt was seen at 1250.  Per pt, c/o gradual onset and persistence of constant acute flair of his chronic low back "pain" for the past years, worse over the past several weeks to months.  Denies any change in his usual longstanding chronic pain pattern "for so long I can't remember." Describes the pain as "aching."  Pain worsens with palpation of the area and body position changes.  Pt states he was eval by his PMD 4 days ago for same, rx hydrocodone without relief.  Denies incont/retention of bowel or bladder, no saddle anesthesia, no focal motor weakness, no tingling/numbness in extremities, no fevers, no injury, no abd pain.   The symptoms have been associated with no other complaints. The patient has a significant history of similar symptoms previously.       Past Medical History  Diagnosis Date  . 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 Rheumatoid arthritis     with nephritis  . Hyperlipidemia   . Hypertension   . PVD (peripheral vascular disease)     Left iliac PCI/stent  . Cerebrovascular disease   . Tobacco abuse, in remission     Remote  . GERD (gastroesophageal reflux disease)   . Hypothyroid   . Allergic rhinitis   . Chronic lung disease     ?  Rheumatoid lung; ?  Adverse reaction to methotrexate  . Cervical spondylosis   . Schatzki's ring   . Hx of Clostridium difficile infection   . Achalasia   . Hiatal hernia   . Coronary artery disease   . Anginal pain     occ  . Heart murmur   . Shortness of breath     occ  . Pneumonia     recent pneumonia  . Diabetes mellitus     borderline no meds  . Stones in the urinary tract   . Atrial fibrillation   . Chronic kidney disease   . DDD (degenerative disc disease), lumbar   . Chronic back pain      Past Surgical History  Procedure Laterality Date  . Coronary artery bypass graft      x6 In 1998  . Total knee arthroplasty      Right  . Ankle fusion      Left  . Wrist fusion      Right  . Shoulder surgery    . Colonoscopy  09/12/2011    Dr. Elly Modena hemorrhoids, normal colon and distal terminal ileum. Random bx negative. Stool for CDiff positive.  . Esophagogastroduodenoscopy  09/13/2011    Dr. Lyndle Herrlich plawues mid-esophagus KOH negative. Distal esophageal ring and ulcer. Mild gastritis. duodenal diverticulum. Savaory dilation 16mm.   Gaspar Bidding dilation  09/13/2011  . Esophageal manometry  09/30/2011    Procedure: ESOPHAGEAL MANOMETRY (EM);  Surgeon: Rob Bunting, MD;  Location: [facility];  Service: Endoscopy;  Laterality: N/A;  . Esophagogastroduodenoscopy  09/12/2011    Dr. Rinaldo Ratel rings s/p dilation  . Cardiac surgery    . Esophagomyotomy  11/12/11    [facility]- with DOR antireflux surgery  . Esophagogastroduodenoscopy  06/25/2012    Procedure: ESOPHAGOGASTRODUODENOSCOPY (EGD);  Surgeon: Corbin Ade, MD;  Location: [facility];  Service:  Endoscopy;  Laterality: N/A;  7:30  . Savory dilation  06/25/2012    Procedure: SAVORY DILATION;  Surgeon: Corbin Ade, MD;  Location: [facility];  Service: Endoscopy;  Laterality: N/A;  Elease Hashimoto dilation  06/25/2012    Procedure: Elease Hashimoto DILATION;  Surgeon: Corbin Ade, MD;  [facility]: [facility];  Service: Endoscopy;  Laterality: N/A;  . Ankle fusion  09/01/2012    Procedure: ANKLE FUSION;  Surgeon: Valeria Batman, MD;  Location: [facility] OR;  Service: Orthopedics;  Laterality: Left;  Take down of angulated tibial/fibula Fractures    Family History  Problem Relation Age of Onset  . Stomach cancer Mother 45  . Cancer Mother   . Heart attack Father   . Heart disease Father   . Heart disease Brother   . Leukemia Brother   . Lung disease Son     infant  . Lung disease Daughter     infant     History  Substance Use Topics  . Smoking status: Former Smoker -- 0.50 packs/day for 5 years    Types: Cigarettes    Quit date: 09/23/1965  . Smokeless tobacco: Not on file  . Alcohol Use: No      Review of Systems ROS: Statement: All systems negative except as marked or noted in the HPI; Constitutional: Negative for fever and chills. ; ; Eyes: Negative for eye pain, redness and discharge. ; ; ENMT: Negative for ear pain, hoarseness, nasal congestion, sinus pressure and sore throat. ; ; Cardiovascular: Negative for chest pain, palpitations, diaphoresis, dyspnea and peripheral edema. ; ; Respiratory: Negative for cough, wheezing and stridor. ; ; Gastrointestinal: Negative for nausea, vomiting, diarrhea, abdominal pain, blood in stool, hematemesis, jaundice and rectal bleeding. . ; ; Genitourinary: Negative for dysuria, flank pain and hematuria. ; ; Genital:  No penile drainage or rash, no testicular pain or swelling, no scrotal rash or swelling.;; Musculoskeletal: +LBP. Negative for neck pain. Negative for swelling and trauma.; ; Skin: Negative for pruritus, rash, abrasions, blisters, bruising and skin lesion.; ; Neuro: Negative for headache, lightheadedness and neck stiffness. Negative for weakness, altered level of consciousness , altered mental status, extremity weakness, paresthesias, involuntary movement, seizure and syncope.       Allergies  Amoxicillin; Doxycycline; and Levofloxacin  Home Medications   Current Outpatient Rx  Name  Route  Sig  Dispense  Refill  . acetaminophen (TYLENOL) 500 MG tablet   Oral   Take 500 mg by mouth 2 (two) times daily.          Marland Kitchen aspirin EC 81 MG tablet   Oral   Take 81 mg by mouth daily.           Marland Kitchen atenolol (TENORMIN) 25 MG tablet   Oral   Take 25 mg by mouth daily.         . cholecalciferol (VITAMIN D) 400 UNITS TABS   Oral   Take 400 Units by mouth daily.          . colchicine 0.6 MG tablet   Oral   Take 0.6 mg by mouth  2 (two) times daily as needed (for gout).          . folic acid (FOLVITE) 1 MG tablet   Oral   Take 1 mg by mouth daily.           Marland Kitchen HYDROcodone-acetaminophen (NORCO/VICODIN) 5-325 MG per tablet   Oral   Take 1 tablet by mouth every 6 (  six) hours as needed for pain.         Marland Kitchen ibuprofen (ADVIL,MOTRIN) 800 MG tablet   Oral   Take 800 mg by mouth 3 (three) times daily.          Marland Kitchen ketoconazole (NIZORAL) 2 % cream   Topical   Apply topically 2 (two) times daily. Use on groin rash   30 g   3   . leflunomide (ARAVA) 20 MG tablet   Oral   Take 20 mg by mouth daily.         Marland Kitchen levothyroxine (SYNTHROID, LEVOTHROID) 100 MCG tablet   Oral   Take 1 tablet (100 mcg total) by mouth daily.   90 tablet   5   . lisinopril (PRINIVIL,ZESTRIL) 20 MG tablet   Oral   Take 20 mg by mouth daily.           . Omega-3 Fatty Acids (FISH OIL) 500 MG CAPS   Oral   Take 1 capsule by mouth daily.         Marland Kitchen omeprazole (PRILOSEC) 20 MG capsule   Oral   Take 20 mg by mouth daily.         . pravastatin (PRAVACHOL) 80 MG tablet   Oral   Take 80 mg by mouth daily with lunch.         . predniSONE (DELTASONE) 1 MG tablet   Oral   Take 8 mg by mouth daily.         . Teriparatide, Recombinant, (FORTEO) 600 MCG/2.4ML SOLN   Subcutaneous   Inject 20 mcg into the skin.         . Triamcinolone Acetonide (TRIAMCINOLONE 0.1 % CREAM : EUCERIN) CREA   Topical   Apply 1 application topically 2 (two) times daily.   1 each   3     May dispense 60 grams   . trolamine salicylate (ASPERCREME) 10 % cream   Topical   Apply 1 application topically at bedtime as needed (for rheumatoid arthritis).         . Vitamin D, Ergocalciferol, (DRISDOL) 50000 UNITS CAPS   Oral   Take 50,000 Units by mouth every 7 (seven) days. On Mondays         . nitrofurantoin, macrocrystal-monohydrate, (MACROBID) 100 MG capsule   Oral   Take 1 capsule (100 mg total) by mouth 2 (two) times daily.   14  capsule   0   . oxyCODONE-acetaminophen (PERCOCET/ROXICET) 5-325 MG per tablet      1 tabs PO q6h prn pain   20 tablet   0     BP 118/53  Pulse 60  Temp(Src) 97.6 F (36.4 C) (Oral)  Resp 18  SpO2 100%  Physical Exam 1255: Physical examination:  Nursing notes reviewed; Vital signs and O2 SAT reviewed;  Constitutional: Well developed, Well nourished, Well hydrated, In no acute distress; Head:  Normocephalic, atraumatic; Eyes: EOMI, PERRL, No scleral icterus; ENMT: Mouth and pharynx normal, Mucous membranes moist; Neck: Supple, Full range of motion, No lymphadenopathy; Cardiovascular: Regular rate and rhythm, No gallop; Respiratory: Breath sounds clear & equal bilaterally, No rales, rhonchi, wheezes.  Speaking full sentences with ease, Normal respiratory effort/excursion; Chest: Nontender, Movement normal; Abdomen: Soft, Nontender, Nondistended, Normal bowel sounds; Genitourinary: No CVA tenderness; Spine:  No midline CS, TS, LS tenderness.  +TTP right lumbar paraspinal muscles;; Extremities: Bilat femoral and RLE pulses normal, decreased peripheral pulses left foot per hx of same. Pelvis stable. No tenderness  right inguinal area, no palp masses, strong palp femoral pulse, no palp thrill. No edema, No calf edema or asymmetry.; Neuro: AA&Ox3, Major CN grossly intact.  Speech clear. Strength 5/5 equal bilat UE's and LE's, including great toe dorsiflexion.  DTR 2/4 equal bilat UE's and LE's.  No gross sensory deficits.  Neg straight leg raises bilat..; Skin: Color normal, Warm, Dry.   ED Course  Procedures     MDM  MDM Reviewed: previous chart, nursing note and vitals Reviewed previous: MRI Interpretation: labs and x-ray     Results for orders placed during the hospital encounter of 01/08/13  URINALYSIS, ROUTINE W REFLEX MICROSCOPIC      Result Value Range   Color, Urine YELLOW  YELLOW   APPearance CLEAR  CLEAR   Specific Gravity, Urine 1.020  1.005 - 1.030   pH 6.0  5.0 - 8.0    Glucose, UA NEGATIVE  NEGATIVE mg/dL   Hgb urine dipstick MODERATE (*) NEGATIVE   Bilirubin Urine NEGATIVE  NEGATIVE   Ketones, ur NEGATIVE  NEGATIVE mg/dL   Protein, ur 30 (*) NEGATIVE mg/dL   Urobilinogen, UA 0.2  0.0 - 1.0 mg/dL   Nitrite NEGATIVE  NEGATIVE   Leukocytes, UA NEGATIVE  NEGATIVE  URINE MICROSCOPIC-ADD ON      Result Value Range   WBC, UA 11-20  <3 WBC/hpf   RBC / HPF 0-2  <3 RBC/hpf   Casts GRANULAR CAST (*) NEGATIVE   Dg Lumbar Spine Complete 01/08/2013  *RADIOLOGY REPORT*  Clinical Data: right-sided back pain  LUMBAR SPINE - COMPLETE 4+ VIEW  Comparison: 11/02/2012  Findings: Left iliac artery stent noted.  There are surgical clips noted within the left upper quadrant of the abdomen.  Round calcified structure in the right upper quadrant of the abdomen is noted which may represent a gallstone.  Curvature of the lumbar spine is convex to the right.  There is mild multilevel disc space narrowing and ventral endplate spurring consistent with degenerative disc disease.  This is most severe at L4-5.  IMPRESSION:  1.  Scoliosis and degenerative disc disease.   Original Report Authenticated By: Signa Kell, M.D.      1550:  No hx aortic aneurysm per EPIC chart review. No abd pain on exam. No red flags on neuro exam. Pt has significant hx chronic LBP, denies any change from his usual today.  Endorses he is "just tired of it hurting."  Feels better after pain meds and wants to go home now. Will change pain meds rx to percocet.  Family states pt was recently treated for "a prostate infection," but are unable to recall the name of abx. +UTI on Udip, UC pending; will start abx.  Dx and testing d/w pt and family.  Questions answered.  Verb understanding, agreeable to d/c home with outpt f/u.        Laray Anger, DO 01/10/13 1247

## 2013-01-09 LAB — URINE CULTURE: Colony Count: NO GROWTH

## 2013-01-10 ENCOUNTER — Inpatient Hospital Stay (HOSPITAL_COMMUNITY)
Admission: EM | Admit: 2013-01-10 | Discharge: 2013-01-13 | DRG: 372 | Disposition: A | Discharge status: Home Health | Payer: Medicare Other | Attending: Internal Medicine | Admitting: Internal Medicine

## 2013-01-10 ENCOUNTER — Emergency Department (HOSPITAL_COMMUNITY): Payer: Medicare Other

## 2013-01-10 ENCOUNTER — Encounter (HOSPITAL_COMMUNITY): Payer: Self-pay | Admitting: *Deleted

## 2013-01-10 DIAGNOSIS — E039 Hypothyroidism, unspecified: Secondary | ICD-10-CM | POA: Diagnosis present

## 2013-01-10 DIAGNOSIS — Z79899 Other long term (current) drug therapy: Secondary | ICD-10-CM

## 2013-01-10 DIAGNOSIS — I739 Peripheral vascular disease, unspecified: Secondary | ICD-10-CM | POA: Diagnosis present

## 2013-01-10 DIAGNOSIS — Z87891 Personal history of nicotine dependence: Secondary | ICD-10-CM

## 2013-01-10 DIAGNOSIS — Z96659 Presence of unspecified artificial knee joint: Secondary | ICD-10-CM

## 2013-01-10 DIAGNOSIS — I2510 Atherosclerotic heart disease of native coronary artery without angina pectoris: Secondary | ICD-10-CM

## 2013-01-10 DIAGNOSIS — K220 Achalasia of cardia: Secondary | ICD-10-CM

## 2013-01-10 DIAGNOSIS — Z800 Family history of malignant neoplasm of digestive organs: Secondary | ICD-10-CM

## 2013-01-10 DIAGNOSIS — I4891 Unspecified atrial fibrillation: Secondary | ICD-10-CM | POA: Diagnosis present

## 2013-01-10 DIAGNOSIS — I679 Cerebrovascular disease, unspecified: Secondary | ICD-10-CM

## 2013-01-10 DIAGNOSIS — M069 Rheumatoid arthritis, unspecified: Secondary | ICD-10-CM | POA: Diagnosis present

## 2013-01-10 DIAGNOSIS — M47812 Spondylosis without myelopathy or radiculopathy, cervical region: Secondary | ICD-10-CM

## 2013-01-10 DIAGNOSIS — E785 Hyperlipidemia, unspecified: Secondary | ICD-10-CM

## 2013-01-10 DIAGNOSIS — I129 Hypertensive chronic kidney disease with stage 1 through stage 4 chronic kidney disease, or unspecified chronic kidney disease: Secondary | ICD-10-CM | POA: Diagnosis present

## 2013-01-10 DIAGNOSIS — Z8249 Family history of ischemic heart disease and other diseases of the circulatory system: Secondary | ICD-10-CM

## 2013-01-10 DIAGNOSIS — A0472 Enterocolitis due to Clostridium difficile, not specified as recurrent: Principal | ICD-10-CM | POA: Diagnosis present

## 2013-01-10 DIAGNOSIS — S82222K Displaced transverse fracture of shaft of left tibia, subsequent encounter for closed fracture with nonunion: Secondary | ICD-10-CM

## 2013-01-10 DIAGNOSIS — M51379 Other intervertebral disc degeneration, lumbosacral region without mention of lumbar back pain or lower extremity pain: Secondary | ICD-10-CM | POA: Diagnosis present

## 2013-01-10 DIAGNOSIS — Z7982 Long term (current) use of aspirin: Secondary | ICD-10-CM

## 2013-01-10 DIAGNOSIS — E038 Other specified hypothyroidism: Secondary | ICD-10-CM

## 2013-01-10 DIAGNOSIS — F17201 Nicotine dependence, unspecified, in remission: Secondary | ICD-10-CM

## 2013-01-10 DIAGNOSIS — L98499 Non-pressure chronic ulcer of skin of other sites with unspecified severity: Secondary | ICD-10-CM

## 2013-01-10 DIAGNOSIS — J984 Other disorders of lung: Secondary | ICD-10-CM | POA: Diagnosis present

## 2013-01-10 DIAGNOSIS — Z806 Family history of leukemia: Secondary | ICD-10-CM

## 2013-01-10 DIAGNOSIS — N179 Acute kidney failure, unspecified: Secondary | ICD-10-CM | POA: Diagnosis present

## 2013-01-10 DIAGNOSIS — E119 Type 2 diabetes mellitus without complications: Secondary | ICD-10-CM | POA: Diagnosis present

## 2013-01-10 DIAGNOSIS — E860 Dehydration: Secondary | ICD-10-CM | POA: Diagnosis present

## 2013-01-10 DIAGNOSIS — M5137 Other intervertebral disc degeneration, lumbosacral region: Secondary | ICD-10-CM | POA: Diagnosis present

## 2013-01-10 DIAGNOSIS — N189 Chronic kidney disease, unspecified: Secondary | ICD-10-CM | POA: Diagnosis present

## 2013-01-10 DIAGNOSIS — Z8619 Personal history of other infectious and parasitic diseases: Secondary | ICD-10-CM

## 2013-01-10 DIAGNOSIS — G8929 Other chronic pain: Secondary | ICD-10-CM | POA: Diagnosis present

## 2013-01-10 DIAGNOSIS — K219 Gastro-esophageal reflux disease without esophagitis: Secondary | ICD-10-CM | POA: Diagnosis present

## 2013-01-10 DIAGNOSIS — Z951 Presence of aortocoronary bypass graft: Secondary | ICD-10-CM

## 2013-01-10 DIAGNOSIS — I10 Essential (primary) hypertension: Secondary | ICD-10-CM | POA: Diagnosis present

## 2013-01-10 LAB — COMPREHENSIVE METABOLIC PANEL
ALT: 109 U/L — ABNORMAL HIGH (ref 0–53)
AST: 95 U/L — ABNORMAL HIGH (ref 0–37)
Alkaline Phosphatase: 139 U/L — ABNORMAL HIGH (ref 39–117)
Calcium: 9.1 mg/dL (ref 8.4–10.5)
Potassium: 4.1 mEq/L (ref 3.5–5.1)
Sodium: 140 mEq/L (ref 135–145)
Total Protein: 7.3 g/dL (ref 6.0–8.3)

## 2013-01-10 LAB — URINALYSIS, ROUTINE W REFLEX MICROSCOPIC
Bilirubin Urine: NEGATIVE
Glucose, UA: NEGATIVE mg/dL
Specific Gravity, Urine: 1.02 (ref 1.005–1.030)
pH: 6 (ref 5.0–8.0)

## 2013-01-10 LAB — CBC WITH DIFFERENTIAL/PLATELET
Basophils Absolute: 0.1 10*3/uL (ref 0.0–0.1)
Eosinophils Absolute: 0.6 10*3/uL (ref 0.0–0.7)
Eosinophils Relative: 7 % — ABNORMAL HIGH (ref 0–5)
HCT: 35.9 % — ABNORMAL LOW (ref 39.0–52.0)
Lymphocytes Relative: 17 % (ref 12–46)
Lymphs Abs: 1.6 10*3/uL (ref 0.7–4.0)
MCH: 29.5 pg (ref 26.0–34.0)
MCV: 85.5 fL (ref 78.0–100.0)
Monocytes Absolute: 1.5 10*3/uL — ABNORMAL HIGH (ref 0.1–1.0)
RDW: 15.6 % — ABNORMAL HIGH (ref 11.5–15.5)
WBC: 9.3 10*3/uL (ref 4.0–10.5)

## 2013-01-10 LAB — CLOSTRIDIUM DIFFICILE BY PCR: Toxigenic C. Difficile by PCR: POSITIVE — AB

## 2013-01-10 LAB — URINE MICROSCOPIC-ADD ON

## 2013-01-10 LAB — LACTIC ACID, PLASMA: Lactic Acid, Venous: 1.2 mmol/L (ref 0.5–2.2)

## 2013-01-10 MED ORDER — ASPIRIN EC 81 MG PO TBEC
81.0000 mg | DELAYED_RELEASE_TABLET | Freq: Every day | ORAL | Status: DC
  Administered 2013-01-10 – 2013-01-13 (×4): 81 mg via ORAL
  Filled 2013-01-10 (×6): qty 1

## 2013-01-10 MED ORDER — TRIAMCINOLONE 0.1 % CREAM:EUCERIN CREAM 1:1
1.0000 "application " | TOPICAL_CREAM | Freq: Two times a day (BID) | CUTANEOUS | Status: DC
  Administered 2013-01-10 – 2013-01-13 (×7): 1 via TOPICAL
  Filled 2013-01-10: qty 1

## 2013-01-10 MED ORDER — VANCOMYCIN 50 MG/ML ORAL SOLUTION
250.0000 mg | Freq: Four times a day (QID) | ORAL | Status: DC
  Administered 2013-01-10 – 2013-01-13 (×13): 250 mg via ORAL
  Filled 2013-01-10 (×21): qty 5

## 2013-01-10 MED ORDER — ATENOLOL 25 MG PO TABS
25.0000 mg | ORAL_TABLET | Freq: Every day | ORAL | Status: DC
  Administered 2013-01-10 – 2013-01-13 (×4): 25 mg via ORAL
  Filled 2013-01-10 (×4): qty 1

## 2013-01-10 MED ORDER — ONDANSETRON HCL 4 MG PO TABS: 4.0000 mg | ORAL_TABLET | Freq: Four times a day (QID) | ORAL | Status: DC | PRN

## 2013-01-10 MED ORDER — HEPARIN SODIUM (PORCINE) 5000 UNIT/ML IJ SOLN
5000.0000 [IU] | Freq: Three times a day (TID) | INTRAMUSCULAR | Status: DC
  Administered 2013-01-10 – 2013-01-13 (×9): 5000 [IU] via SUBCUTANEOUS
  Filled 2013-01-10 (×9): qty 1

## 2013-01-10 MED ORDER — LEVOTHYROXINE SODIUM 100 MCG PO TABS
100.0000 ug | ORAL_TABLET | Freq: Every day | ORAL | Status: DC
  Administered 2013-01-10 – 2013-01-13 (×4): 100 ug via ORAL
  Filled 2013-01-10 (×6): qty 1

## 2013-01-10 MED ORDER — SIMVASTATIN 20 MG PO TABS
40.0000 mg | ORAL_TABLET | Freq: Every day | ORAL | Status: DC
  Administered 2013-01-10 – 2013-01-12 (×3): 40 mg via ORAL
  Filled 2013-01-10 (×4): qty 2

## 2013-01-10 MED ORDER — FOLIC ACID 1 MG PO TABS
1.0000 mg | ORAL_TABLET | Freq: Every day | ORAL | Status: DC
  Administered 2013-01-10 – 2013-01-13 (×4): 1 mg via ORAL
  Filled 2013-01-10 (×4): qty 1

## 2013-01-10 MED ORDER — FOLIC ACID 1 MG PO TABS: 1.0000 mg | ORAL_TABLET | Freq: Every day | ORAL | Status: DC

## 2013-01-10 MED ORDER — MUSCLE RUB 10-15 % EX CREA
TOPICAL_CREAM | CUTANEOUS | Status: DC | PRN
  Filled 2013-01-10: qty 85

## 2013-01-10 MED ORDER — LEFLUNOMIDE 20 MG PO TABS
20.0000 mg | ORAL_TABLET | Freq: Every day | ORAL | Status: DC
  Administered 2013-01-10 – 2013-01-11 (×2): 20 mg via ORAL
  Filled 2013-01-10 (×4): qty 1

## 2013-01-10 MED ORDER — HYDROCODONE-ACETAMINOPHEN 5-325 MG PO TABS
1.0000 | ORAL_TABLET | Freq: Four times a day (QID) | ORAL | Status: DC | PRN
  Administered 2013-01-10 – 2013-01-13 (×10): 1 via ORAL
  Filled 2013-01-10 (×10): qty 1

## 2013-01-10 MED ORDER — TROLAMINE SALICYLATE 10 % EX CREA: 1.0000 "application " | TOPICAL_CREAM | Freq: Every evening | CUTANEOUS | Status: DC | PRN

## 2013-01-10 MED ORDER — CHOLECALCIFEROL 10 MCG (400 UNIT) PO TABS
400.0000 [IU] | ORAL_TABLET | Freq: Every day | ORAL | Status: DC
  Administered 2013-01-10 – 2013-01-13 (×4): 400 [IU] via ORAL
  Filled 2013-01-10 (×6): qty 1

## 2013-01-10 MED ORDER — HYDROMORPHONE HCL PF 1 MG/ML IJ SOLN
1.0000 mg | INTRAMUSCULAR | Status: DC | PRN
  Administered 2013-01-10 – 2013-01-13 (×12): 1 mg via INTRAVENOUS
  Filled 2013-01-10 (×13): qty 1

## 2013-01-10 MED ORDER — PANTOPRAZOLE SODIUM 40 MG PO TBEC
40.0000 mg | DELAYED_RELEASE_TABLET | Freq: Every day | ORAL | Status: DC
  Administered 2013-01-10 – 2013-01-11 (×2): 40 mg via ORAL
  Filled 2013-01-10 (×2): qty 1

## 2013-01-10 MED ORDER — ACETAMINOPHEN 500 MG PO TABS
500.0000 mg | ORAL_TABLET | Freq: Two times a day (BID) | ORAL | Status: DC
  Administered 2013-01-10 – 2013-01-13 (×7): 500 mg via ORAL
  Filled 2013-01-10 (×7): qty 1

## 2013-01-10 MED ORDER — ONDANSETRON HCL 4 MG/2ML IJ SOLN
4.0000 mg | Freq: Once | INTRAMUSCULAR | Status: AC
  Administered 2013-01-10: 4 mg via INTRAVENOUS
  Filled 2013-01-10: qty 2

## 2013-01-10 MED ORDER — COLCHICINE 0.6 MG PO TABS
0.6000 mg | ORAL_TABLET | Freq: Two times a day (BID) | ORAL | Status: DC
  Administered 2013-01-10 – 2013-01-11 (×3): 0.6 mg via ORAL
  Filled 2013-01-10 (×3): qty 1

## 2013-01-10 MED ORDER — ONDANSETRON HCL 4 MG/2ML IJ SOLN: 4.0000 mg | Freq: Four times a day (QID) | INTRAMUSCULAR | Status: DC | PRN

## 2013-01-10 MED ORDER — OMEGA-3-ACID ETHYL ESTERS 1 G PO CAPS
1.0000 g | ORAL_CAPSULE | Freq: Two times a day (BID) | ORAL | Status: DC
  Administered 2013-01-10 – 2013-01-13 (×6): 1 g via ORAL
  Filled 2013-01-10 (×6): qty 1

## 2013-01-10 MED ORDER — VITAMIN D (ERGOCALCIFEROL) 1.25 MG (50000 UNIT) PO CAPS
50000.0000 [IU] | ORAL_CAPSULE | ORAL | Status: DC
  Administered 2013-01-10: 50000 [IU] via ORAL
  Filled 2013-01-10: qty 1

## 2013-01-10 MED ORDER — PREDNISONE 10 MG PO TABS
5.0000 mg | ORAL_TABLET | Freq: Every day | ORAL | Status: DC
  Administered 2013-01-10 – 2013-01-12 (×3): 5 mg via ORAL
  Filled 2013-01-10 (×3): qty 1

## 2013-01-10 MED ORDER — IOHEXOL 300 MG/ML  SOLN: 50.0000 mL | Freq: Once | INTRAMUSCULAR | Status: AC | PRN

## 2013-01-10 MED ORDER — SODIUM CHLORIDE 0.9 % IV SOLN
INTRAVENOUS | Status: DC
  Administered 2013-01-10 – 2013-01-12 (×5): via INTRAVENOUS

## 2013-01-10 NOTE — ED Notes (Addendum)
Pedal pulses attempted with doppler, no pulse detected with exception to posterior tibial pulse on inside of lt ankle.

## 2013-01-10 NOTE — ED Notes (Signed)
Report given to [facility] room 331

## 2013-01-10 NOTE — ED Notes (Signed)
Pt back from CT

## 2013-01-10 NOTE — ED Provider Notes (Signed)
History     CSN: [id number]  Arrival date & time 01/10/13  0800   First MD Initiated Contact with Patient 01/10/13 [phone]      Chief Complaint  Patient presents with  . Diarrhea  . Nausea  . Foot Pain    lt foot has gangreenous 5th toe    (Consider location/radiation/quality/duration/timing/severity/associated sxs/prior treatment) Patient is a 67 y.o. male presenting with diarrhea. The history is provided by the patient.  Diarrhea Quality:  Watery Severity:  Severe Onset quality:  Sudden Timing:  Intermittent Relieved by:  Nothing Associated symptoms: abdominal pain   Associated symptoms: no chills, no fever and no headaches   Associated symptoms comment:  Nausea Risk factors: recent antibiotic use   [name] is a 67 y.o. male who presents to the ED with diarrhea. The diarrhea started last night. During the night he went about 10 times. He describes the diarrhea as watery brown. He was here 2 days ago and treated with Cipro for UTI. Associated symptoms include nausea and abdominal pain. He also complains of pain in the left little toe. He is scheduled for vascular surgery on the foot next week due to decreased circulation.   Past Medical History  Diagnosis Date  . 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 Rheumatoid arthritis     with nephritis  . Hyperlipidemia   . Hypertension   . PVD (peripheral vascular disease)     Left iliac PCI/stent  . Cerebrovascular disease   . Tobacco abuse, in remission     Remote  . GERD (gastroesophageal reflux disease)   . Hypothyroid   . Allergic rhinitis   . Chronic lung disease     ?  Rheumatoid lung; ?  Adverse reaction to methotrexate  . Cervical spondylosis   . Schatzki's ring   . Hx of Clostridium difficile infection   . Achalasia   . Hiatal hernia   . Coronary artery disease   . Anginal pain     occ  . Heart murmur   . Shortness of breath     occ  . Pneumonia     recent pneumonia  . Diabetes  mellitus     borderline no meds  . Stones in the urinary tract   . Atrial fibrillation   . Chronic kidney disease   . DDD (degenerative disc disease), lumbar   . Chronic back pain     Past Surgical History  Procedure Laterality Date  . Coronary artery bypass graft      x6 In 1998  . Total knee arthroplasty      Right  . Ankle fusion      Left  . Wrist fusion      Right  . Shoulder surgery    . Colonoscopy  09/12/2011    Dr. Elly Modena hemorrhoids, normal colon and distal terminal ileum. Random bx negative. Stool for CDiff positive.  . Esophagogastroduodenoscopy  09/13/2011    Dr. Lyndle Herrlich plawues mid-esophagus KOH negative. Distal esophageal ring and ulcer. Mild gastritis. duodenal diverticulum. Savaory dilation 16mm.   Gaspar Bidding dilation  09/13/2011  . Esophageal manometry  09/30/2011    Procedure: ESOPHAGEAL MANOMETRY (EM);  Surgeon: Rob Bunting, MD;  Location: [facility];  Service: Endoscopy;  Laterality: N/A;  . Esophagogastroduodenoscopy  09/12/2011    Dr. Rinaldo Ratel rings s/p dilation  . Cardiac surgery    . Esophagomyotomy  11/12/11    [facility]- with DOR antireflux surgery  . Esophagogastroduodenoscopy  06/25/2012  Procedure: ESOPHAGOGASTRODUODENOSCOPY (EGD);  Surgeon: Corbin Ade, MD;  [facility]: [facility];  Service: Endoscopy;  Laterality: N/A;  7:30  . Savory dilation  06/25/2012    Procedure: SAVORY DILATION;  Surgeon: Corbin Ade, MD;  [facility];  Service: Endoscopy;  Laterality: N/A;  Elease Hashimoto dilation  06/25/2012    Procedure: Elease Hashimoto DILATION;  Surgeon: Corbin Ade, MD;  [facility];  Service: Endoscopy;  Laterality: N/A;  . Ankle fusion  09/01/2012    Procedure: ANKLE FUSION;  Surgeon: Valeria Batman, MD;  Location: [facility] OR;  Service: Orthopedics;  Laterality: Left;  Take down of angulated tibial/fibula Fractures    Family History  Problem Relation Age of Onset  . Stomach cancer Mother 38  . Cancer  Mother   . Heart attack Father   . Heart disease Father   . Heart disease Brother   . Leukemia Brother   . Lung disease Son     infant  . Lung disease Daughter     infant    History  Substance Use Topics  . Smoking status: Former Smoker -- 0.50 packs/day for 5 years    Types: Cigarettes    Quit date: 09/23/1965  . Smokeless tobacco: Not on file  . Alcohol Use: No      Review of Systems  Constitutional: Positive for appetite change (no appetite). Negative for fever and chills.  HENT: Negative for nosebleeds, congestion and neck pain.   Eyes: Negative for visual disturbance.  Respiratory: Negative for cough, chest tightness and shortness of breath.   Cardiovascular: Negative for chest pain and palpitations.  Gastrointestinal: Positive for nausea, abdominal pain and diarrhea.  Genitourinary: Negative for dysuria, urgency and frequency.  Skin: Positive for rash (left leg being treated by PCP).  Allergic/Immunologic: Negative for environmental allergies and food allergies.  Neurological: Positive for light-headedness. Negative for dizziness and headaches.  Psychiatric/Behavioral: Negative for confusion. The patient is not nervous/anxious.     Allergies  Amoxicillin; Doxycycline; and Levofloxacin  Home Medications   Current Outpatient Rx  Name  Route  Sig  Dispense  Refill  . acetaminophen (TYLENOL) 500 MG tablet   Oral   Take 500 mg by mouth 2 (two) times daily.          Marland Kitchen aspirin EC 81 MG tablet   Oral   Take 81 mg by mouth daily.           Marland Kitchen atenolol (TENORMIN) 25 MG tablet   Oral   Take 25 mg by mouth daily.         . cholecalciferol (VITAMIN D) 400 UNITS TABS   Oral   Take 400 Units by mouth daily.          . ciprofloxacin (CIPRO) 500 MG tablet   Oral   Take 1 tablet (500 mg total) by mouth 2 (two) times daily.   14 tablet   0   . colchicine 0.6 MG tablet   Oral   Take 0.6 mg by mouth 2 (two) times daily.          . folic acid (FOLVITE) 1  MG tablet   Oral   Take 1 mg by mouth daily.           Marland Kitchen HYDROcodone-acetaminophen (NORCO/VICODIN) 5-325 MG per tablet   Oral   Take 1 tablet by mouth every 6 (six) hours as needed for pain.         Marland Kitchen ibuprofen (ADVIL,MOTRIN)  800 MG tablet   Oral   Take 800 mg by mouth 3 (three) times daily.          Marland Kitchen leflunomide (ARAVA) 20 MG tablet   Oral   Take 20 mg by mouth daily.         Marland Kitchen levothyroxine (SYNTHROID, LEVOTHROID) 100 MCG tablet   Oral   Take 1 tablet (100 mcg total) by mouth daily.   90 tablet   5   . lisinopril (PRINIVIL,ZESTRIL) 20 MG tablet   Oral   Take 20 mg by mouth daily.           . Omega-3 Fatty Acids (FISH OIL) 500 MG CAPS   Oral   Take 1 capsule by mouth daily.         Marland Kitchen omeprazole (PRILOSEC) 20 MG capsule   Oral   Take 40 mg by mouth daily.          . pravastatin (PRAVACHOL) 80 MG tablet   Oral   Take 80 mg by mouth daily with lunch.         . predniSONE (DELTASONE) 5 MG tablet   Oral   Take 5 mg by mouth daily.         . Teriparatide, Recombinant, (FORTEO) 600 MCG/2.4ML SOLN   Subcutaneous   Inject 20 mcg into the skin.         . Triamcinolone Acetonide (TRIAMCINOLONE 0.1 % CREAM : EUCERIN) CREA   Topical   Apply 1 application topically 2 (two) times daily.   1 each   3     May dispense 60 grams   . trolamine salicylate (ASPERCREME) 10 % cream   Topical   Apply 1 application topically at bedtime as needed (for rheumatoid arthritis).         . Vitamin D, Ergocalciferol, (DRISDOL) 50000 UNITS CAPS   Oral   Take 50,000 Units by mouth every 7 (seven) days. On Mondays           BP 146/63  Pulse 64  Temp(Src) 97.9 F (36.6 C) (Oral)  Resp 18  Ht 5\' 8"  (1.727 m)  Wt 140 lb (63.504 kg)  BMI 21.29 kg/m2  SpO2 99%  Physical Exam  Nursing note and vitals reviewed. Constitutional: He is oriented to person, place, and time. No distress.  Cardiovascular: Normal rate.   Pulmonary/Chest: Effort normal. No  respiratory distress. He has no wheezes.  Abdominal: Soft. Bowel sounds are increased. There is generalized tenderness. There is no rebound, no guarding and no CVA tenderness.  Musculoskeletal:       Left foot: He exhibits swelling and decreased capillary refill. He exhibits no tenderness.       Feet:  Left foot with decreased circulation, pitting edema, unable to palpate pedal pulse. Toes dark in color, 5th toe black at the inner aspect. Decreased touch sensation of entire foot.  Neurological: He is alert and oriented to person, place, and time.  Skin: Rash noted.  Rash to left leg and buttocks. Currently under treatemnt.  Psychiatric: His speech is normal and behavior is normal. Judgment and thought content normal. His mood appears anxious. Cognition and memory are normal.    ED Course  Procedures (including critical care time) Results for orders placed during the hospital encounter of 01/10/13 (from the past 24 hour(s))  CBC WITH DIFFERENTIAL     Status: Abnormal   Collection Time    01/10/13  9:10 AM      Result Value Range  WBC 9.3  4.0 - 10.5 K/uL   RBC 4.20 (*) 4.22 - 5.81 MIL/uL   Hemoglobin 12.4 (*) 13.0 - 17.0 g/dL   HCT 16.1 (*) 09.6 - 04.5 %   MCV 85.5  78.0 - 100.0 fL   MCH 29.5  26.0 - 34.0 pg   MCHC 34.5  30.0 - 36.0 g/dL   RDW 40.9 (*) 81.1 - 91.4 %   Platelets 166  150 - 400 K/uL   Neutrophils Relative 59  43 - 77 %   Neutro Abs 5.5  1.7 - 7.7 K/uL   Lymphocytes Relative 17  12 - 46 %   Lymphs Abs 1.6  0.7 - 4.0 K/uL   Monocytes Relative 16 (*) 3 - 12 %   Monocytes Absolute 1.5 (*) 0.1 - 1.0 K/uL   Eosinophils Relative 7 (*) 0 - 5 %   Eosinophils Absolute 0.6  0.0 - 0.7 K/uL   Basophils Relative 1  0 - 1 %   Basophils Absolute 0.1  0.0 - 0.1 K/uL  COMPREHENSIVE METABOLIC PANEL     Status: Abnormal   Collection Time    01/10/13  9:10 AM      Result Value Range   Sodium 140  135 - 145 mEq/L   Potassium 4.1  3.5 - 5.1 mEq/L   Chloride 111  96 - 112 mEq/L    CO2 15 (*) 19 - 32 mEq/L   Glucose, Bld 79  70 - 99 mg/dL   BUN 26 (*) 6 - 23 mg/dL   Creatinine, Ser 7.82 (*) 0.50 - 1.35 mg/dL   Calcium 9.1  8.4 - 95.6 mg/dL   Total Protein 7.3  6.0 - 8.3 g/dL   Albumin 3.4 (*) 3.5 - 5.2 g/dL   AST 95 (*) 0 - 37 U/L   ALT 109 (*) 0 - 53 U/L   Alkaline Phosphatase 139 (*) 39 - 117 U/L   Total Bilirubin 0.5  0.3 - 1.2 mg/dL   GFR calc non Af Amer 43 (*) >90 mL/min   GFR calc Af Amer 50 (*) >90 mL/min  URINALYSIS, ROUTINE W REFLEX MICROSCOPIC     Status: Abnormal   Collection Time    01/10/13  9:20 AM      Result Value Range   Color, Urine YELLOW  YELLOW   APPearance CLEAR  CLEAR   Specific Gravity, Urine 1.020  1.005 - 1.030   pH 6.0  5.0 - 8.0   Glucose, UA NEGATIVE  NEGATIVE mg/dL   Hgb urine dipstick LARGE (*) NEGATIVE   Bilirubin Urine NEGATIVE  NEGATIVE   Ketones, ur NEGATIVE  NEGATIVE mg/dL   Protein, ur 213 (*) NEGATIVE mg/dL   Urobilinogen, UA 0.2  0.0 - 1.0 mg/dL   Nitrite NEGATIVE  NEGATIVE   Leukocytes, UA NEGATIVE  NEGATIVE  URINE MICROSCOPIC-ADD ON     Status: None   Collection Time    01/10/13  9:20 AM      Result Value Range   Squamous Epithelial / LPF RARE  RARE   WBC, UA 0-2  <3 WBC/hpf   RBC / HPF 7-10  <3 RBC/hpf   Bacteria, UA RARE  RARE  LACTIC ACID, PLASMA     Status: None   Collection Time    01/10/13 10:20 AM      Result Value Range   Lactic Acid, Venous 1.2  0.5 - 2.2 mmol/L  CLOSTRIDIUM DIFFICILE BY PCR     Status: Abnormal   Collection  Time    01/10/13 10:30 AM      Result Value Range   C difficile by pcr POSITIVE (*) NEGATIVE   Assessment:  67 y.o. male with abdominal pain   Diarrhea/C. Diff  Plan:  Admit by Dr. Karilyn Cota   CT abdomen pending    MDM  I have reviewed this patient's vital signs, nurses notes, appropriate labs and imaging.  I have discussed with Dr. Karilyn Cota and he will admit the patient.      [place], [place] 01/10/13 [phone]

## 2013-01-10 NOTE — ED Notes (Signed)
Pt is not ambulatory, HX of RA, pt uses a motorized wheelchair and can stand with assistance. Pt was clean on inspection, no redness noted. Pt states he doesn't feel like he has uncontrollable diarrhea and he will let staff know when he needs to use the bathroom.

## 2013-01-10 NOTE — H&P (Signed)
[facility] History and Physical  [name] WUJ:[id number] DOB: 1946-04-20 DOA: 01/10/2013  Referring physician: ER physician. PCP: [name], MD    Chief Complaint: Watery diarrhea.  HPI: [name] is a 66 y.o. male who presents with watery diarrhea which started yesterday evening. He actually tells me that he has been having diarrhea for 2-3 weeks but not as significant as it was yesterday. The diarrhea was associated with abdominal cramping. There has been nausea but no vomiting. He has not had a fever. Approximately one month ago he was put on 3 week course of antibiotics for prostatitis by his primary care physician. He came to the emergency room 2 days ago with a history of back pain. He was evaluated and apart from the back pain it was felt that he had a UTI. Therefore he was given ciprofloxacin by the emergency room physician. After he started the ciprofloxacin, but profuse watery diarrhea started yesterday. There has been no rectal bleeding with the diarrhea.  Review of Systems: Apart from history of present illness, other systems negative.  Past Medical History  Diagnosis Date  . 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 Rheumatoid arthritis     with nephritis  . Hyperlipidemia   . Hypertension   . PVD (peripheral vascular disease)     Left iliac PCI/stent  . Cerebrovascular disease   . Tobacco abuse, in remission     Remote  . GERD (gastroesophageal reflux disease)   . Hypothyroid   . Allergic rhinitis   . Chronic lung disease     ?  Rheumatoid lung; ?  Adverse reaction to methotrexate  . Cervical spondylosis   . Schatzki's ring   . Hx of Clostridium difficile infection   . Achalasia   . Hiatal hernia   . Coronary artery disease   . Anginal pain     occ  . Heart murmur   . Shortness of breath     occ  . Pneumonia     recent pneumonia  . Diabetes mellitus     borderline no meds  . Stones in the urinary tract   . Atrial  fibrillation   . Chronic kidney disease   . DDD (degenerative disc disease), lumbar   . Chronic back pain    Past Surgical History  Procedure Laterality Date  . Coronary artery bypass graft      x6 In 1998  . Total knee arthroplasty      Right  . Ankle fusion      Left  . Wrist fusion      Right  . Shoulder surgery    . Colonoscopy  09/12/2011    Dr. Elly Modena hemorrhoids, normal colon and distal terminal ileum. Random bx negative. Stool for CDiff positive.  . Esophagogastroduodenoscopy  09/13/2011    Dr. Lyndle Herrlich plawues mid-esophagus KOH negative. Distal esophageal ring and ulcer. Mild gastritis. duodenal diverticulum. Savaory dilation 16mm.   Gaspar Bidding dilation  09/13/2011  . Esophageal manometry  09/30/2011    Procedure: ESOPHAGEAL MANOMETRY (EM);  Surgeon: Rob Bunting, MD;  Location: [facility];  Service: Endoscopy;  Laterality: N/A;  . Esophagogastroduodenoscopy  09/12/2011    Dr. Rinaldo Ratel rings s/p dilation  . Cardiac surgery    . Esophagomyotomy  11/12/11    [facility]- with D[facility] antireflux surgery  . Esophagogastroduodenoscopy  06/25/2012    Procedure: ESOPHAGOGASTRODUODENOSCOPY (EGD);  Surgeon: Corbin Ade, MD;  Location: [facility];  Service: Endoscopy;  Laterality:  N/A;  7:30  . Savory dilation  06/25/2012    Procedure: SAVORY DILATION;  Surgeon: Corbin Ade, MD;  Location: [facility];  Service: Endoscopy;  Laterality: N/A;  Elease Hashimoto dilation  06/25/2012    Procedure: Elease Hashimoto DILATION;  Surgeon: Corbin Ade, MD;  Location: [facility];  Service: Endoscopy;  Laterality: N/A;  . Ankle fusion  09/01/2012    Procedure: ANKLE FUSION;  Surgeon: Valeria Batman, MD;  Location[facility] OR;  Service: Orthopedics;  Laterality: Left;  Take down of angulated tibial/fibula Fractures   Social History:  reports that he quit smoking about 47 years ago. His smoking use included Cigarettes. He has a 2.5 pack-year smoking history. He does not have any  smokeless tobacco history on file. He reports that he does not drink alcohol or use illicit drugs. He is married and lives with his wife. His rheumatoid arthritis limits him with mobility.   Allergies  Allergen Reactions  . Amoxicillin Hives  . Doxycycline Nausea And Vomiting  . Levofloxacin Nausea And Vomiting    Family History  Problem Relation Age of Onset  . Stomach cancer Mother 57  . Cancer Mother   . Heart attack Father   . Heart disease Father   . Heart disease Brother   . Leukemia Brother   . Lung disease Son     infant  . Lung disease Daughter     infant      Prior to Admission medications   Medication Sig Start Date End Date Taking? Authorizing Provider  acetaminophen (TYLENOL) 500 MG tablet Take 500 mg by mouth 2 (two) times daily.    Yes Historical Provider, MD  aspirin EC 81 MG tablet Take 81 mg by mouth daily.     Yes Historical Provider, MD  atenolol (TENORMIN) 25 MG tablet Take 25 mg by mouth daily.   Yes Historical Provider, MD  cholecalciferol (VITAMIN D) 400 UNITS TABS Take 400 Units by mouth daily.    Yes Historical Provider, MD  ciprofloxacin (CIPRO) 500 MG tablet Take 1 tablet (500 mg total) by mouth 2 (two) times daily. 01/08/13  Yes Laray Anger, DO  colchicine 0.6 MG tablet Take 0.6 mg by mouth 2 (two) times daily.    Yes Historical Provider, MD  folic acid (FOLVITE) 1 MG tablet Take 1 mg by mouth daily.     Yes Historical Provider, MD  HYDROcodone-acetaminophen (NORCO/VICODIN) 5-325 MG per tablet Take 1 tablet by mouth every 6 (six) hours as needed for pain.   Yes Historical Provider, MD  ibuprofen (ADVIL,MOTRIN) 800 MG tablet Take 800 mg by mouth 3 (three) times daily.    Yes Historical Provider, MD  leflunomide (ARAVA) 20 MG tablet Take 20 mg by mouth daily.   Yes Historical Provider, MD  levothyroxine (SYNTHROID, LEVOTHROID) 100 MCG tablet Take 1 tablet (100 mcg total) by mouth daily. 12/14/12  Yes Merlyn Albert, MD  lisinopril  (PRINIVIL,ZESTRIL) 20 MG tablet Take 20 mg by mouth daily.     Yes Historical Provider, MD  Omega-3 Fatty Acids (FISH OIL) 500 MG CAPS Take 1 capsule by mouth daily.   Yes Historical Provider, MD  omeprazole (PRILOSEC) 20 MG capsule Take 40 mg by mouth daily.    Yes Historical Provider, MD  pravastatin (PRAVACHOL) 80 MG tablet Take 80 mg by mouth daily with lunch.   Yes Historical Provider, MD  predniSONE (DELTASONE) 5 MG tablet Take 5 mg by mouth daily.   Yes Historical  Provider, MD  Teriparatide, Recombinant, (FORTEO) 600 MCG/2.4ML SOLN Inject 20 mcg into the skin.   Yes Historical Provider, MD  Triamcinolone Acetonide (TRIAMCINOLONE 0.1 % CREAM : EUCERIN) CREA Apply 1 application topically 2 (two) times daily. 01/04/13 02/03/13 Yes Babs Sciara, MD  trolamine salicylate (ASPERCREME) 10 % cream Apply 1 application topically at bedtime as needed (for rheumatoid arthritis).   Yes Historical Provider, MD  Vitamin D, Ergocalciferol, (DRISDOL) 50000 UNITS CAPS Take 50,000 Units by mouth every 7 (seven) days. On Mondays   Yes Historical Provider, MD   Physical Exam: Filed Vitals:   01/10/13 0800 01/10/13 1125 01/10/13 1257 01/10/13 1300  BP: 152/72 146/63 165/74 147/64  Pulse: 70 64 75 72  Temp: 97.9 F (36.6 C)   98.7 F (37.1 C)  TempSrc: Oral   Oral  Resp:  18 18 18   Height: 5\' 8"  (1.727 m)     Weight: 63.504 kg (140 lb)     SpO2: 100% 99% 98% 98%     General:  He looks clinically dehydrated. He does not look septic or toxic.  Eyes: No pallor. No jaundice.  ENT: No abnormalities.  Neck: No lymphadenopathy.  Cardiovascular: Heart sounds are present and in sinus rhythm. No murmurs or added sounds.  Respiratory: Lung fields are clear.  Abdomen: Soft and tenderness generalized fashion. There is no rebound. Bowel sounds are heard.  Skin: No skin rash.  Musculoskeletal: Typical rheumatoid hands.  Psychiatric: Appropriate affect.  Neurologic: Alert and orientated without any  focal neurological signs.  Labs on Admission:  Basic Metabolic Panel:  Recent Labs Lab 01/10/13 0910  NA 140  K 4.1  CL 111  CO2 15*  GLUCOSE 79  BUN 26*  CREATININE 1.60*  CALCIUM 9.1   Liver Function Tests:  Recent Labs Lab 01/10/13 0910  AST 95*  ALT 109*  ALKPHOS 139*  BILITOT 0.5  PROT 7.3  ALBUMIN 3.4*     CBC:  Recent Labs Lab 01/10/13 0910  WBC 9.3  NEUTROABS 5.5  HGB 12.4*  HCT 35.9*  MCV 85.5  PLT 166     Radiological Exams on Admission: Ct Abdomen Pelvis Wo Contrast  01/10/2013  *RADIOLOGY REPORT*  Clinical Data: Abdominal pain, diarrhea.  Nausea.  CT ABDOMEN AND PELVIS WITHOUT CONTRAST  Technique:  Multidetector CT imaging of the abdomen and pelvis was performed following the standard protocol without intravenous contrast.  Comparison: 0427  Findings: Heart is upper limits normal in size.  Areas of scarring in the lung bases.  No acute opacities.  No effusions.  Gallstone again noted within the gallbladder.  No CT evidence for acute cholecystitis.  Liver, spleen, pancreas, adrenals have an unremarkable unenhanced appearance.  Bilateral nonobstructing renal stones.  Small cysts in the right kidney.  No hydronephrosis or ureteral stones.  Appendix is visualized and is normal.  Large and small bowel are grossly unremarkable.  The no bowel wall thickening.  No free fluid, free air or adenopathy.  Urinary bladder and prostate grossly unremarkable.  Aorta and iliac vessels are calcified, non-aneurysmal.  IMPRESSION: Cholelithiasis.  No acute findings in the abdomen or pelvis.   Original Report Authenticated By: Charlett Nose, M.D.    Dg Lumbar Spine Complete  01/08/2013  *RADIOLOGY REPORT*  Clinical Data: right-sided back pain  LUMBAR SPINE - COMPLETE 4+ VIEW  Comparison: 11/02/2012  Findings: Left iliac artery stent noted.  There are surgical clips noted within the left upper quadrant of the abdomen.  Round calcified structure in  the right upper quadrant of the  abdomen is noted which may represent a gallstone.  Curvature of the lumbar spine is convex to the right.  There is mild multilevel disc space narrowing and ventral endplate spurring consistent with degenerative disc disease.  This is most severe at L4-5.  IMPRESSION:  1.  Scoliosis and degenerative disc disease.   Original Report Authenticated By: Signa Kell, M.D.      Assessment/Plan   1. Clostridium difficile diarrhea. CT scan does not indicate colitis, thankfully. Recent use of ciprofloxacin and prior to that also recently use of another antibiotic. 2. Rheumatoid arthritis. 3. Hypertension. 4. Type 2 diabetes mellitus. 5. Dehydration and acute renal failure. Secondary to #1.  Plan: 1. Admit to medical floor.  2. Oral vancomycin. 3. Intravenous fluids. Antiemetics as required. 4. Monitor renal function closely. Further recommendations will depend on patient's hospital progress.   Code Status: Full code.  Family Communication: Discussed plan with patient and patient's wife at the bedside.   Disposition Plan: Home when medically stable.   Time spent: 45 minutes.  Stanforth Singer [facility] Pager [phone].  If 7PM-7AM, please contact night-coverage [email or web address] Password [email or web address] 01/10/2013, 1:16 PM

## 2013-01-10 NOTE — ED Notes (Signed)
Pt co diarrhea all night, nausea, pain to lt 5th toe - gangreenous chronic issue.

## 2013-01-11 LAB — COMPREHENSIVE METABOLIC PANEL
Albumin: 3.1 g/dL — ABNORMAL LOW (ref 3.5–5.2)
Alkaline Phosphatase: 128 U/L — ABNORMAL HIGH (ref 39–117)
BUN: 18 mg/dL (ref 6–23)
Calcium: 8.8 mg/dL (ref 8.4–10.5)
GFR calc Af Amer: 64 mL/min — ABNORMAL LOW (ref >90)
Glucose, Bld: 81 mg/dL (ref 70–99)
Potassium: 3.5 mEq/L (ref 3.5–5.1)
Total Protein: 6.4 g/dL (ref 6.0–8.3)

## 2013-01-11 LAB — CBC
HCT: 32.5 % — ABNORMAL LOW (ref 39.0–52.0)
Hemoglobin: 11.4 g/dL — ABNORMAL LOW (ref 13.0–17.0)
MCV: 85.1 fL (ref 78.0–100.0)
RBC: 3.82 MIL/uL — ABNORMAL LOW (ref 4.22–5.81)
RDW: 15.5 % (ref 11.5–15.5)
WBC: 6.5 10*3/uL (ref 4.0–10.5)

## 2013-01-11 MED ORDER — BOOST / RESOURCE BREEZE PO LIQD
1.0000 | Freq: Three times a day (TID) | ORAL | Status: DC
  Administered 2013-01-11 – 2013-01-13 (×5): 1 via ORAL

## 2013-01-11 MED ORDER — LISINOPRIL 10 MG PO TABS
20.0000 mg | ORAL_TABLET | Freq: Every day | ORAL | Status: DC
  Administered 2013-01-11 – 2013-01-13 (×3): 20 mg via ORAL
  Filled 2013-01-11 (×3): qty 2

## 2013-01-11 NOTE — ED Provider Notes (Signed)
Medical screening examination/treatment/procedure(s) were performed by non-physician practitioner and as supervising physician I was immediately available for consultation/collaboration.  67yo M, c/o diarrhea for the past 2-3 weeks, worse since yesterday. States diarrhea began while taking 3 week course of abx rx by his PMD for "a prostate infection." Has been associated with nausea and cramping abd pain. No black or bloody stools. Seen in ED 2d ago for c/o chronic LBP and +pyuria, tx with cipro.  VSS, afebrile. WBC and lactic acid normal. UA today without pyuria. CT A/P without acute findings.  Has had multiple stools while in the ED; culture pending, cdiff+.  Will admit.  Laray Anger, DO 01/11/13 1227

## 2013-01-11 NOTE — Progress Notes (Addendum)
[facility] PROGRESS NOTE  [name] ZOX:[id number] DOB: Jan 23, 1946 DOA: 01/10/2013 PCP: [name], MD  Assessment/Plan:  C. difficile diarrhea: likely related to recent antibiotic use. CT negative for colitis. Afebrile, no white count.  Stool culture pending. Pt states diarrhea slowing down somewhat. Continues with some abdominal pain. Will continue clear liquid diet today.  Continue oral vanc day #2. Monitor Active Problems:  Acute renal failure related to #1 and decreased po intake. Improved. Will continue gently IV hydration. Monitor.      Peripheral vascular disease; recently evaluated by Dr. Hart Rochester and ischemic left foot and painful left toe. S/p iliac stent 2009. To be scheduled for intervention this week or next.     GASTROESOPHAGEAL REFLUX DISEASE: stable at baseline. Currently on clear liquids due to #1.    Hypertension: fair control. Continue home meds    Rheumatoid arthritis: severe. In wheelchair bound at home. Can stand up to void but non-ambulatory    Diabetes mellitus: controlled. Carb modified diet. No med      Dehydration: resolved. Likely related to #1. Will monitor intake and output. Continue gentle IV hydration.   Code Status: full Family Communication:  Disposition Plan: home when medically stable likely day or two   Consultants:  none  Procedures:  none  Antibiotics:  Vancomycin po 01/10/13>>  HPI/Subjective: Awake alert states he feels a little better and having less frequent loose stool. Continues with some abdominal pain. No nausea  Objective: Filed Vitals:   01/10/13 1522 01/10/13 1554 01/10/13 2100 01/11/13 0500  BP: 155/79  144/69 166/71  Pulse:   64 70  Temp:   98.6 F (37 C) 97.8 F (36.6 C)  TempSrc:   Oral Oral  Resp:   18 18  Height:  5\' 8"  (1.727 m)    Weight:  67 kg (147 lb 11.3 oz)    SpO2:   96%     Intake/Output Summary (Last 24 hours) at 01/11/13 0909 Last data filed at 01/11/13 0600  Gross per 24 hour   Intake 2256.25 ml  Output   1350 ml  Net 906.25 ml   Filed Weights   01/10/13 0800 01/10/13 1554  Weight: 63.504 kg (140 lb) 67 kg (147 lb 11.3 oz)    Exam:   General:  Well nourished NAD  Cardiovascular: RRR No MGR No LE edema. Left foot somewhat mottles with black, tender left small toe.   Respiratory: normal effort BS clear bilaterally. No rhonchi no wheeze  Abdomen: flat soft +BS diffuse mild tenderness to palpation   Musculoskeletal: Rheumatoid joints particularly left elbow, knees left ankle, both hands   Data Reviewed: Basic Metabolic Panel:  Recent Labs Lab 01/10/13 0910 01/11/13 0513  NA 140 141  K 4.1 3.5  CL 111 113*  CO2 15* 16*  GLUCOSE 79 81  BUN 26* 18  CREATININE 1.60* 1.30  CALCIUM 9.1 8.8   Liver Function Tests:  Recent Labs Lab 01/10/13 0910 01/11/13 0513  AST 95* 95*  ALT 109* 94*  ALKPHOS 139* 128*  BILITOT 0.5 0.6  PROT 7.3 6.4  ALBUMIN 3.4* 3.1*   No results found for this basename: LIPASE, AMYLASE,  in the last 168 hours No results found for this basename: AMMONIA,  in the last 168 hours CBC:  Recent Labs Lab 01/10/13 0910 01/11/13 0513  WBC 9.3 6.5  NEUTROABS 5.5  --   HGB 12.4* 11.4*  HCT 35.9* 32.5*  MCV 85.5 85.1  PLT 166 152   Cardiac  Enzymes: No results found for this basename: CKTOTAL, CKMB, CKMBINDEX, TROPONINI,  in the last 168 hours BNP (last 3 results) No results found for this basename: PROBNP,  in the last 8760 hours CBG: No results found for this basename: GLUCAP,  in the last 168 hours  Recent Results (from the past 240 hour(s))  URINE CULTURE     Status: None   Collection Time    01/08/13  1:20 PM      Result Value Range Status   Specimen Description URINE, CLEAN CATCH   Final   Special Requests NONE   Final   Culture  Setup Time 01/08/2013 23:51   Final   Colony Count NO GROWTH   Final   Culture NO GROWTH   Final   Report Status 01/09/2013 FINAL   Final  CLOSTRIDIUM DIFFICILE BY PCR      Status: Abnormal   Collection Time    01/10/13 10:30 AM      Result Value Range Status   C difficile by pcr POSITIVE (*) NEGATIVE Final   Comment: RESULT CALLED TO, READ BACK BY AND VERIFIED WITH:     MEADOWS,G AT 1149 ON 01/10/2013 BY MOSLEY,J     Studies: Ct Abdomen Pelvis Wo Contrast  01/10/2013  *RADIOLOGY REPORT*  Clinical Data: Abdominal pain, diarrhea.  Nausea.  CT ABDOMEN AND PELVIS WITHOUT CONTRAST  Technique:  Multidetector CT imaging of the abdomen and pelvis was performed following the standard protocol without intravenous contrast.  Comparison: 0427  Findings: Heart is upper limits normal in size.  Areas of scarring in the lung bases.  No acute opacities.  No effusions.  Gallstone again noted within the gallbladder.  No CT evidence for acute cholecystitis.  Liver, spleen, pancreas, adrenals have an unremarkable unenhanced appearance.  Bilateral nonobstructing renal stones.  Small cysts in the right kidney.  No hydronephrosis or ureteral stones.  Appendix is visualized and is normal.  Large and small bowel are grossly unremarkable.  The no bowel wall thickening.  No free fluid, free air or adenopathy.  Urinary bladder and prostate grossly unremarkable.  Aorta and iliac vessels are calcified, non-aneurysmal.  IMPRESSION: Cholelithiasis.  No acute findings in the abdomen or pelvis.   Original Report Authenticated By: Charlett Nose, M.D.     Scheduled Meds: . acetaminophen  500 mg Oral BID  . aspirin EC  81 mg Oral Daily  . atenolol  25 mg Oral Daily  . cholecalciferol  400 Units Oral Daily  . colchicine  0.6 mg Oral BID  . folic acid  1 mg Oral Daily  . heparin  5,000 Units Subcutaneous Q8H  . leflunomide  20 mg Oral Daily  . levothyroxine  100 mcg Oral QAC breakfast  . omega-3 acid ethyl esters  1 g Oral BID  . pantoprazole  40 mg Oral Daily  . predniSONE  5 mg Oral Daily  . simvastatin  40 mg Oral q1800  . triamcinolone 0.1 % cream : eucerin  1 application Topical BID  .  vancomycin  250 mg Oral Q6H  . Vitamin D (Ergocalciferol)  50,000 Units Oral Q7 days   Continuous Infusions: . sodium chloride 125 mL/hr at 01/10/13 2200    Time spent: 40 minutes    [facility] M  [facility]  If 7PM-7AM, please contact night-coverage at [email or web address], password [facility] 01/11/2013, 9:09 AM  LOS: 1 day   Attending note  Patient interviewed and examined. Chart reviewed. Discontinue colchicine, as this may worsen diarrhea. Discontinue protonic  6 in the setting of C. difficile infection. Hold Arava in the setting of infection and discuss with Dr. Nickola Major, patient's rheumatologist, tomorrow. Patient reports that his back is "locking up" and it is much stiffer and in a significant amount of pain. Patient is hesitant to advance his diet or DC Foley in the setting of difficulty with ambulation. Will get a physical therapy consult. Patient has an elderly infirm wife and may require assistance at home. advance diet tomorrow morning. Blood pressure is high. Will resume antihypertensive.   Crista Curb, M.D.

## 2013-01-11 NOTE — Progress Notes (Signed)
INITIAL NUTRITION ASSESSMENT  DOCUMENTATION CODES Per approved criteria  -Not Applicable   INTERVENTION: Resource Breeze po BID, each supplement provides 250 kcal and 9 grams of protein.  NUTRITION DIAGNOSIS: Inadequate oral food intake related to decreased appetite as evidenced by 3.3% wt loss x 1 week.   Goal: 1) Pt will meet >75% of estimated energy needs 2) Pt will maintain current wt of 147#  Monitor:  Diet advancement, PO intake, skin integrity, labs, weight changes, changes in stauts  Reason for Assessment: MST=2  67 y.o. male  Admitting Dx: C. difficile diarrhea  ASSESSMENT: Pt unavailable at time of visit. Chart reviewed indicated diarrhea for the past 2-3 weeks. Pt with numerous scratches and lt toe with gangrene tissue. Pt is scheduled to see vascular surgery.  Weight hx reveals UBW of 150#. Noted 3.3% wt loss x 1 week, which is significant.  Pt tolerating clear liquid diet well; noted 75% intake at breakfast this AM.  Pt does not meet criteria for mlanutrition at this time, but is at risk for malnutrition at this time, given hx of recent wt loss, acute illness, and increased nutrient needs due to gangrene.   Height: Ht Readings from Last 1 Encounters:  01/10/13 5\' 8"  (1.727 m)    Weight: Wt Readings from Last 1 Encounters:  01/10/13 147 lb 11.3 oz (67 kg)    Ideal Body Weight: 154#  % Ideal Body Weight: 95%  Wt Readings from Last 10 Encounters:  01/10/13 147 lb 11.3 oz (67 kg)  01/04/13 152 lb 3.2 oz (69.037 kg)  01/01/13 152 lb (68.947 kg)  01/01/13 152 lb (68.947 kg)  12/31/12 152 lb 12.8 oz (69.31 kg)  08/28/12 163 lb 2.3 oz (74 kg)  08/10/12 150 lb (68.04 kg)  06/25/12 151 lb (68.493 kg)  06/25/12 151 lb (68.493 kg)  05/29/12 151 lb 6.4 oz (68.675 kg)    Usual Body Weight: 150#  % Usual Body Weight: 98%  BMI:  Body mass index is 22.46 kg/(m^2). Classified as normal weight.   Estimated Nutritional Needs: Kcal: 1400-1500 daily Protein:  53-67 grams protein daily Fluid: 1.4-1.5 L fluid daily  Skin: scratches, lt toe gangrenous chronic tissue  Diet Order: Clear Liquid  EDUCATION NEEDS: -Education not appropriate at this time   Intake/Output Summary (Last 24 hours) at 01/11/13 1153 Last data filed at 01/11/13 0800  Gross per 24 hour  Intake 2376.25 ml  Output   1350 ml  Net 1026.25 ml    Last BM: 01/11/13   Labs:   Recent Labs Lab 01/10/13 0910 01/11/13 0513  NA 140 141  K 4.1 3.5  CL 111 113*  CO2 15* 16*  BUN 26* 18  CREATININE 1.60* 1.30  CALCIUM 9.1 8.8  GLUCOSE 79 81    CBG (last 3)  No results found for this basename: GLUCAP,  in the last 72 hours  Scheduled Meds: . acetaminophen  500 mg Oral BID  . aspirin EC  81 mg Oral Daily  . atenolol  25 mg Oral Daily  . cholecalciferol  400 Units Oral Daily  . colchicine  0.6 mg Oral BID  . folic acid  1 mg Oral Daily  . heparin  5,000 Units Subcutaneous Q8H  . leflunomide  20 mg Oral Daily  . levothyroxine  100 mcg Oral QAC breakfast  . omega-3 acid ethyl esters  1 g Oral BID  . pantoprazole  40 mg Oral Daily  . predniSONE  5 mg Oral Daily  .  simvastatin  40 mg Oral q1800  . triamcinolone 0.1 % cream : eucerin  1 application Topical BID  . vancomycin  250 mg Oral Q6H  . Vitamin D (Ergocalciferol)  50,000 Units Oral Q7 days    Continuous Infusions: . sodium chloride 75 mL/hr at 01/11/13 4782    Past Medical History  Diagnosis Date  . 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 Hyperlipidemia   . Hypertension   . PVD (peripheral vascular disease)     Left iliac PCI/stent  . Cerebrovascular disease   . Tobacco abuse, in remission     Remote  . GERD (gastroesophageal reflux disease)   . Hypothyroid   . Allergic rhinitis   . Chronic lung disease     ?  Rheumatoid lung; ?  Adverse reaction to methotrexate  . Schatzki's ring   . Hx of Clostridium difficile infection   . Achalasia   . Hiatal hernia   .  Coronary artery disease   . Anginal pain     occ  . Heart murmur   . Shortness of breath     occ  . Pneumonia     recent pneumonia  . Diabetes mellitus     borderline no meds  . Stones in the urinary tract   . Atrial fibrillation   . Chronic kidney disease   . Chronic back pain   . Rheumatoid arthritis     with nephritis  . Cervical spondylosis   . DDD (degenerative disc disease), lumbar     Past Surgical History  Procedure Laterality Date  . Coronary artery bypass graft      x6 In 1998  . Total knee arthroplasty      Right  . Ankle fusion      Left  . Wrist fusion      Right  . Shoulder surgery    . Colonoscopy  09/12/2011    Dr. Elly Modena hemorrhoids, normal colon and distal terminal ileum. Random bx negative. Stool for CDiff positive.  . Esophagogastroduodenoscopy  09/13/2011    Dr. Lyndle Herrlich plawues mid-esophagus KOH negative. Distal esophageal ring and ulcer. Mild gastritis. duodenal diverticulum. Savaory dilation 16mm.   Gaspar Bidding dilation  09/13/2011  . Esophageal manometry  09/30/2011    Procedure: ESOPHAGEAL MANOMETRY (EM);  Surgeon: Rob Bunting, MD;  Location: [facility];  Service: Endoscopy;  Laterality: N/A;  . Esophagogastroduodenoscopy  09/12/2011    Dr. Rinaldo Ratel rings s/p dilation  . Cardiac surgery    . Esophagomyotomy  11/12/11    [facility]- with DOR antireflux surgery  . Esophagogastroduodenoscopy  06/25/2012    Procedure: ESOPHAGOGASTRODUODENOSCOPY (EGD);  Surgeon: Corbin Ade, MD;  Location: [facility];  Service: Endoscopy;  Laterality: N/A;  7:30  . Savory dilation  06/25/2012    Procedure: SAVORY DILATION;  Surgeon: Corbin Ade, MD;  Location: [facility];  Service: Endoscopy;  Laterality: N/A;  Elease Hashimoto dilation  06/25/2012    Procedure: Elease Hashimoto DILATION;  Surgeon: Corbin Ade, MD;  Location: [facility];  Service: Endoscopy;  Laterality: N/A;  . Ankle fusion  09/01/2012    Procedure: ANKLE FUSION;  Surgeon: Valeria Batman, MD;  Location: [facility] OR;  Service: Orthopedics;  Laterality: Left;  Take down of angulated tibial/fibula Fractures    Melody Haver, RD, LDN Pager: [phone]

## 2013-01-12 DIAGNOSIS — L98499 Non-pressure chronic ulcer of skin of other sites with unspecified severity: Secondary | ICD-10-CM

## 2013-01-12 LAB — CBC
HCT: 34.1 % — ABNORMAL LOW (ref 39.0–52.0)
Hemoglobin: 11.9 g/dL — ABNORMAL LOW (ref 13.0–17.0)
MCH: 29.6 pg (ref 26.0–34.0)
MCHC: 34.9 g/dL (ref 30.0–36.0)
MCV: 84.8 fL (ref 78.0–100.0)
RBC: 4.02 MIL/uL — ABNORMAL LOW (ref 4.22–5.81)

## 2013-01-12 LAB — BASIC METABOLIC PANEL
BUN: 14 mg/dL (ref 6–23)
CO2: 18 mEq/L — ABNORMAL LOW (ref 19–32)
Calcium: 8.9 mg/dL (ref 8.4–10.5)
GFR calc non Af Amer: 68 mL/min — ABNORMAL LOW (ref >90)
Glucose, Bld: 87 mg/dL (ref 70–99)
Sodium: 141 mEq/L (ref 135–145)

## 2013-01-12 LAB — GLUCOSE, CAPILLARY

## 2013-01-12 MED ORDER — PREDNISONE 10 MG PO TABS
10.0000 mg | ORAL_TABLET | Freq: Every day | ORAL | Status: DC
  Administered 2013-01-13: 10 mg via ORAL
  Filled 2013-01-12: qty 1

## 2013-01-12 MED ORDER — POTASSIUM CHLORIDE CRYS ER 20 MEQ PO TBCR
40.0000 meq | EXTENDED_RELEASE_TABLET | ORAL | Status: AC
  Administered 2013-01-12 (×2): 40 meq via ORAL
  Filled 2013-01-12: qty 2

## 2013-01-12 NOTE — Clinical Social Work Note (Signed)
Clinical Social Work Department BRIEF PSYCHOSOCIAL ASSESSMENT 01/12/2013  Patient:  [name], [name]     Account Number:  [id number]     Admit date:  01/10/2013  Clinical Social Worker:  Nancie Neas  Date/Time:  01/12/2013 12:20 PM  Referred by:  Physician  Date Referred:  01/12/2013 Referred for  SNF Placement   Other Referral:   Interview type:  Patient Other interview type:   wife and son    PSYCHOSOCIAL DATA Living Status:  WIFE Admitted from facility:   Level of care:   Primary support name:  Hazel Primary support relationship to patient:  SPOUSE Degree of support available:   supportive    CURRENT CONCERNS Current Concerns  Post-Acute Placement   Other Concerns:    SOCIAL WORK ASSESSMENT / PLAN CSW met with pt, pt's wife, and son at bedside. Pt alert and oriented and reports he lives with his wife who is supportive, but limited in her ability to assist pt. Pt began to experience increased weakness last week when he started having diarrhea. At baseline, he furniture walks and uses a power wheelchair. However, this past week he has required more assistance. Pt trying to schedule surgery at [facility].  Pt evaluated by PT today and recommendation is for SNF. CSW discussed placement process and pt and family are agreeable. They request [facility] if possible. SNF list provided. They are aware of Medicare coverage/criteria.   Assessment/plan status:  Psychosocial Support/Ongoing Assessment of Needs Other assessment/ plan:   Information/referral to community resources:   SNF list    PATIENT'S/FAMILY'S RESPONSE TO PLAN OF CARE: Pt and family agreeable to [facility] for rehab. CSW to fax out FL2 and follow up with bed offers when available.       Derenda Fennel, [place] [phone]

## 2013-01-12 NOTE — Evaluation (Signed)
Physical Therapy Evaluation Patient Details Name: [name] MRN: [id number] DOB: 1945-12-08 Today's Date: 01/12/2013 Time: 0902-1010 PT Time Calculation (min): 68 min  PT Assessment / Plan / Recommendation Clinical Impression  Pt was seen for evaluation.  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  Up until a week ago, he had been fairly independent at home and able to ambulate with no assistive device (and occasionally with a walker ).  He has had issues with diarrhea and this has caused severe weakness/deconditioning . He now needs max assist with transfer bed to chair and is unable to ambulate.  He states that the vascular surgeon is hoping to improve the blood supply to his LLE as soon as possible, but wants him to be medically stable before he intercedes.  I am recommending SNF at d/c as his wife is not currently capable of managing him until he is stronger.  Otherwise, they will need a full time CG at home to assist. We did initiate gentle ROM/stretching exercise for his back and this helped  to minimize his pain a great deal.  My impression is that he is dealing with muscular stiffness at this point and he is responding well to stretching and increased activity.              PT Assessment  Patient needs continued PT services    Follow Up Recommendations  SNF    Does the patient have the potential to tolerate intense rehabilitation      Barriers to Discharge Decreased caregiver support      Equipment Recommendations  None recommended by PT    Recommendations for Other Services OT consult   Frequency Min 3X/week    Precautions / Restrictions Precautions Precautions: Fall Restrictions Weight Bearing Restrictions: No   Pertinent Vitals/Pain       Mobility  Bed Mobility Bed Mobility: Rolling Right;Rolling Left;Supine to Sit Rolling Right: 3: Mod assist;With rail Rolling Left: 3: Mod assist;With rail Supine to Sit: 3:  Mod assist;With rails Transfers Transfers: Stand Pivot Transfers Stand Pivot Transfers: 2: Max assist Ambulation/Gait Ambulation/Gait Assistance: Not tested (comment) (unable)    Exercises General Exercises - Lower Extremity Ankle Circles/Pumps: AROM;Both;10 reps Other Exercises Other Exercises: trunk rolling with stretch Other Exercises: scapular protraction, unilaterally with passive stretch   PT Diagnosis: Difficulty walking;Generalized weakness;Acute pain  PT Problem List: Decreased strength;Decreased range of motion;Decreased mobility;Pain PT Treatment Interventions: Functional mobility training;Therapeutic activities;Therapeutic exercise   PT Goals Acute Rehab PT Goals PT Goal Formulation: With patient/family Time For Goal Achievement: 01/19/13 Potential to Achieve Goals: Fair Pt will go Supine/Side to Sit: with min assist;with HOB not 0 degrees (comment degree) PT Goal: Supine/Side to Sit - Progress: Goal set today Pt will go Sit to Supine/Side: with mod assist;with HOB not 0 degrees (comment degree) PT Goal: Sit to Supine/Side - Progress: Goal set today Pt will go Sit to Stand: with min assist;with upper extremity assist PT Goal: Sit to Stand - Progress: Goal set today Pt will go Stand to Sit: with min assist;with upper extremity assist PT Goal: Stand to Sit - Progress: Goal set today Pt will Transfer Bed to Chair/Chair to Bed: with mod assist PT Transfer Goal: Bed to Chair/Chair to Bed - Progress: Goal set today  Visit Information  Last PT Received On: 01/12/13    Subjective Data  Subjective: Pt reports having back stiffness along with diarrhea Patient Stated Goal: functional independence  Prior Functioning  Home Living Lives With: Spouse Available Help at Discharge: Family;Available 24 hours/day Type of Home: House Home Access: Level entry Home Layout: Laundry or work area in basement [employer] Shower/Tub: Other (comment) (takes a sponge bath) Bathroom  Toilet: Standard Bathroom Accessibility: No (not via w/c) Home Adaptive Equipment: Walker - rolling;Straight cane;Wheelchair - powered;Bedside commode/3-in-1 Additional Comments: pt sleeps in a recliner...family would like for him to have a lift chair Prior Function Level of Independence: Independent with assistive device(s) Able to Take Stairs?: No Driving: Yes Vocation: Retired Musician: No difficulties    Copywriter, advertising Arousal/Alertness: Awake/alert Behavior During Therapy: WFL for tasks assessed/performed Overall Cognitive Status: Within Functional Limits for tasks assessed    Extremity/Trunk Assessment Right Upper Extremity Assessment RUE ROM/Strength/Tone: Deficits RUE ROM/Strength/Tone Deficits: severe limitation of function due to RA.. .elbow extension about -30 degrees with almost no active function of shoulder due to rotator cuff degeneration, ..unable to fully flex fingers Left Upper Extremity Assessment LUE ROM/Strength/Tone: Deficits LUE ROM/Strength/Tone Deficits: shoulder is slightly more functional than RUE with -30 degrees of elbow extension, no ROM of the wrist Right Lower Extremity Assessment RLE ROM/Strength/Tone: Deficits RLE ROM/Strength/Tone Deficits: strength 3-/5 generalized Left Lower Extremity Assessment LLE ROM/Strength/Tone: Deficits LLE ROM/Strength/Tone Deficits: generally 3-/5 strength with limited ankle ROM due to old fusion...pt has severe restriction of vascular supply to LLE and gangrenous 5th toe LLE Sensation: Deficits LLE Sensation Deficits: no sensation on plantar surface of foot Trunk Assessment Trunk Assessment: Kyphotic   Balance Balance Balance Assessed: Yes Static Sitting Balance Static Sitting - Balance Support: No upper extremity supported;Feet supported Static Sitting - Level of Assistance: 6: Modified independent (Device/Increase time) Dynamic Sitting Balance Dynamic Sitting - Balance Support: No upper  extremity supported;Feet supported Dynamic Sitting - Level of Assistance: 5: Stand by assistance  End of Session PT - End of Session Equipment Utilized During Treatment: Gait belt Activity Tolerance: Patient tolerated treatment well Patient left: in chair;with call bell/phone within reach;with family/visitor present Nurse Communication: Mobility status  GP     Konrad Penta 01/12/2013, 5:02 PM

## 2013-01-12 NOTE — Care Management Note (Signed)
    Page 1 of 2   01/13/2013     2:57:25 PM   CARE MANAGEMENT NOTE 01/13/2013  Patient:  [name], [name]   Account Number:  [id number]  Date Initiated:  01/12/2013  Documentation initiated by:  Rosemary Holms  Subjective/Objective Assessment:   Pt admitted from home. Per PT, pt could benefit from Skilled placement. CSW working on SNF disposition, hopefully tomorrow if medically stable.     Action/Plan:   Anticipated DC Date:  01/13/2013   Anticipated DC Plan:  HOME W HOME HEALTH SERVICES  In-house referral  Clinical Social Worker      DC Planning Services  CM consult      Choice offered to / List presented to:     DME arranged  3-N-1      DME agency  [facility]     [facility] arranged  HH-1 RN  HH-10 DISEASE MANAGEMENT  HH-2 PT  HH-4 NURSE'S AIDE  HH-6 SOCIAL WORKER      HH agency  [facility]   Status of service:  Completed, signed off Medicare Important Message given?  YES (If response is "NO", the following Medicare IM given date fields will be blank) Date Medicare IM given:  01/13/2013 Date Additional Medicare IM given:    Discharge Disposition:  HOME W HOME HEALTH SERVICES  Per UR Regulation:    If discussed at Long Length of Stay Meetings, dates discussed:    Comments:  01/12/13 Rosemary Holms RN bSN CM

## 2013-01-12 NOTE — Clinical Social Work Placement (Signed)
Clinical Social Work Department CLINICAL SOCIAL WORK PLACEMENT NOTE 01/12/2013  Patient:  [name], [name]  Account Number:  [id number] Admit date:  01/10/2013  Clinical Social Worker:  Derenda Fennel, LCSW  Date/time:  01/12/2013 12:15 PM  Clinical Social Work is seeking post-discharge placement for this patient at the following level of care:   SKILLED NURSING   (*CSW will update this form in Epic as items are completed)   01/12/2013  Patient/family provided with [facility] Department of Clinical Social Work's list of facilities offering this level of care within the geographic area requested by the patient (or if unable, by the patient's family).  01/12/2013  Patient/family informed of their freedom to choose among providers that offer the needed level of care, that participate in Medicare, Medicaid or managed care program needed by the patient, have an available bed and are willing to accept the patient.  01/12/2013  Patient/family informed of [facility]' ownership interest in [facility], as well as of the fact that they are under no obligation to receive care at this facility.  PASARR submitted to EDS on 01/12/2013 PASARR number received from EDS on 01/12/2013  FL2 transmitted to all facilities in geographic area requested by pt/family on  01/12/2013 FL2 transmitted to all facilities within larger geographic area on   Patient informed that his/her managed care company has contracts with or will negotiate with  certain facilities, including the following:     Patient/family informed of bed offers received:   Patient chooses bed at  Physician recommends and patient chooses bed at    Patient to be transferred to  on   Patient to be transferred to facility by   The following physician request were entered in Epic:   Additional Comments:  Derenda Fennel, LCSW [phone]

## 2013-01-12 NOTE — Progress Notes (Signed)
Patient refuses for Foley catheter to be removed. Dr. Lendell Caprice aware.

## 2013-01-12 NOTE — Progress Notes (Signed)
[facility] PROGRESS NOTE  NEWTON FRUTIGER ZOX:[id number] DOB: 12-20-1945 DOA: 01/10/2013 PCP: [name], MD  Assessment/Plan: 1.C. difficile diarrhea: likely related to recent antibiotic use. CT negative for colitis. Afebrile, no white count. Stool culture without suspicious colonies. Stool formed today and less episodes. Will advance  diet today. Continue oral vanc day #3. Monitor  Active Problems:  Acute renal failure related to #1 and decreased po intake. Resolved. Taking po's well . Will discontinue IV fluid.    Peripheral vascular disease; recently evaluated by Dr. Hart Rochester and ischemic left foot and painful left toe. S/p iliac stent 2009. To be scheduled for intervention this week or next.   GASTROESOPHAGEAL REFLUX DISEASE: stable at baseline. Advance diet. PPI discontinued secondary to #1.    Hypertension: fair control. Continue home meds   Reumatoid arthritis: severe and seems to be worse than baseline.  Dr Lendell Caprice spoke to Dr. Juanetta Gosling who agreed to increasing prednisone to 10mg  daily and discontinuing arava.    Diabetes mellitus: controlled. Carb modified diet. No med   Dehydration: resolved. Likely related to #1. Will monitor intake and output. Cont     Code Status: full Family Communication: wife at bedside Disposition Plan: facility when ready hopefully tomorrow   Consultants:  none  Procedures:  none  Antibiotics:  Vancomycin 01/10/13>>>>  HPI/Subjective: Awake up in chair. Anxious about foley removal. No diarrhea  Objective: Filed Vitals:   01/11/13 1338 01/11/13 2154 01/12/13 0433 01/12/13 0441  BP: 179/80 170/81 155/54 155/54  Pulse: 73 67 59 66  Temp: 97.8 F (36.6 C) 98.2 F (36.8 C) 97.8 F (36.6 C) 97.8 F (36.6 C)  TempSrc: Oral Oral Oral Oral  Resp: 18 16 17 18   Height:      Weight:      SpO2: 98% 98% 99% 100%    Intake/Output Summary (Last 24 hours) at 01/12/13 1220 Last data filed at 01/12/13 1121  Gross per 24 hour  Intake  2028.33 ml  Output   2300 ml  Net -271.67 ml   Filed Weights   01/10/13 0800 01/10/13 1554  Weight: 63.504 kg (140 lb) 67 kg (147 lb 11.3 oz)    Exam:   General:  Well nourished NAD  Cardiovascular: RRR No MGR No LE edema Left small toe black tender  Respiratory: normal effort BSCTAB no wheeze no rhonchi  Abdomen: flat +BS non-tender to palpation  Musculoskeletal: rheumatoid joints    Data Reviewed: Basic Metabolic Panel:  Recent Labs Lab 01/10/13 0910 01/11/13 0513 01/12/13 0554  NA 140 141 141  K 4.1 3.5 3.0*  CL 111 113* 111  CO2 15* 16* 18*  GLUCOSE 79 81 87  BUN 26* 18 14  CREATININE 1.60* 1.30 1.09  CALCIUM 9.1 8.8 8.9   Liver Function Tests:  Recent Labs Lab 01/10/13 0910 01/11/13 0513  AST 95* 95*  ALT 109* 94*  ALKPHOS 139* 128*  BILITOT 0.5 0.6  PROT 7.3 6.4  ALBUMIN 3.4* 3.1*   No results found for this basename: LIPASE, AMYLASE,  in the last 168 hours No results found for this basename: AMMONIA,  in the last 168 hours CBC:  Recent Labs Lab 01/10/13 0910 01/11/13 0513 01/12/13 0554  WBC 9.3 6.5 7.7  NEUTROABS 5.5  --   --   HGB 12.4* 11.4* 11.9*  HCT 35.9* 32.5* 34.1*  MCV 85.5 85.1 84.8  PLT 166 152 153   Cardiac Enzymes: No results found for this basename: CKTOTAL, CKMB, CKMBINDEX, TROPONINI,  in the  last 168 hours BNP (last 3 results) No results found for this basename: PROBNP,  in the last 8760 hours CBG: No results found for this basename: GLUCAP,  in the last 168 hours  Recent Results (from the past 240 hour(s))  URINE CULTURE     Status: None   Collection Time    01/08/13  1:20 PM      Result Value Range Status   Specimen Description URINE, CLEAN CATCH   Final   Special Requests NONE   Final   Culture  Setup Time 01/08/2013 23:51   Final   Colony Count NO GROWTH   Final   Culture NO GROWTH   Final   Report Status 01/09/2013 FINAL   Final  CLOSTRIDIUM DIFFICILE BY PCR     Status: Abnormal   Collection Time     01/10/13 10:30 AM      Result Value Range Status   C difficile by pcr POSITIVE (*) NEGATIVE Final   Comment: RESULT CALLED TO, READ BACK BY AND VERIFIED WITH:     MEADOWS,G AT 1149 ON 01/10/2013 BY MOSLEY,J  STOOL CULTURE     Status: None   Collection Time    01/10/13 10:33 AM      Result Value Range Status   Specimen Description STOOL   Final   Special Requests Normal   Final   Culture     Final   Value: NO SUSPICIOUS COLONIES, CONTINUING TO HOLD     Note: REDUCED NORMAL FLORA PRESENT   Report Status PENDING   Incomplete     Studies: Ct Abdomen Pelvis Wo Contrast  01/10/2013  *RADIOLOGY REPORT*  Clinical Data: Abdominal pain, diarrhea.  Nausea.  CT ABDOMEN AND PELVIS WITHOUT CONTRAST  Technique:  Multidetector CT imaging of the abdomen and pelvis was performed following the standard protocol without intravenous contrast.  Comparison: 0427  Findings: Heart is upper limits normal in size.  Areas of scarring in the lung bases.  No acute opacities.  No effusions.  Gallstone again noted within the gallbladder.  No CT evidence for acute cholecystitis.  Liver, spleen, pancreas, adrenals have an unremarkable unenhanced appearance.  Bilateral nonobstructing renal stones.  Small cysts in the right kidney.  No hydronephrosis or ureteral stones.  Appendix is visualized and is normal.  Large and small bowel are grossly unremarkable.  The no bowel wall thickening.  No free fluid, free air or adenopathy.  Urinary bladder and prostate grossly unremarkable.  Aorta and iliac vessels are calcified, non-aneurysmal.  IMPRESSION: Cholelithiasis.  No acute findings in the abdomen or pelvis.   Original Report Authenticated By: Charlett Nose, M.D.     Scheduled Meds: . acetaminophen  500 mg Oral BID  . aspirin EC  81 mg Oral Daily  . atenolol  25 mg Oral Daily  . cholecalciferol  400 Units Oral Daily  . feeding supplement  1 Container Oral TID BM  . folic acid  1 mg Oral Daily  . heparin  5,000 Units Subcutaneous  Q8H  . levothyroxine  100 mcg Oral QAC breakfast  . lisinopril  20 mg Oral Daily  . omega-3 acid ethyl esters  1 g Oral BID  . potassium chloride  40 mEq Oral Q4H  . [START ON 01/13/2013] predniSONE  10 mg Oral Daily  . simvastatin  40 mg Oral q1800  . triamcinolone 0.1 % cream : eucerin  1 application Topical BID  . vancomycin  250 mg Oral Q6H  . Vitamin D (Ergocalciferol)  50,000 Units Oral Q7 days   Continuous Infusions: . sodium chloride 75 mL/hr at 01/12/13 0456    Principal Problem:   C. difficile diarrhea Active Problems:   HYPOTHYROIDISM   Peripheral vascular disease   GASTROESOPHAGEAL REFLUX DISEASE   Hypertension   Rheumatoid arthritis   Diabetes mellitus   Chronic lung disease   Dehydration   Time spent: 30 minutes   [facility] M  [facility]  If 7PM-7AM, please contact night-coverage at [email or web address], password [facility] 01/12/2013, 12:20 PM  LOS: 2 days   Attending note:  Patient interviewed and examined.  D/W Dr. Nickola Major.  She agrees with d/c ARAVA until after surgery.  Increase pred to 10.  Tol solids. Stools becoming more formed.  Less frequent.  To SNF tomorrow.  Crista Curb, M.D.

## 2013-01-13 DIAGNOSIS — A0472 Enterocolitis due to Clostridium difficile, not specified as recurrent: Secondary | ICD-10-CM

## 2013-01-13 LAB — BASIC METABOLIC PANEL
BUN: 12 mg/dL (ref 6–23)
Creatinine, Ser: 0.99 mg/dL (ref 0.50–1.35)
GFR calc Af Amer: >90 mL/min (ref >90)
GFR calc non Af Amer: 83 mL/min — ABNORMAL LOW (ref >90)
Glucose, Bld: 89 mg/dL (ref 70–99)
Potassium: 3.4 mEq/L — ABNORMAL LOW (ref 3.5–5.1)

## 2013-01-13 MED ORDER — BOOST / RESOURCE BREEZE PO LIQD: 1.0000 | Freq: Three times a day (TID) | ORAL | Status: DC

## 2013-01-13 MED ORDER — POTASSIUM CHLORIDE CRYS ER 20 MEQ PO TBCR
40.0000 meq | EXTENDED_RELEASE_TABLET | Freq: Once | ORAL | Status: AC
  Administered 2013-01-13: 40 meq via ORAL
  Filled 2013-01-13: qty 2

## 2013-01-13 MED ORDER — PREDNISONE 10 MG PO TABS: 10.0000 mg | ORAL_TABLET | Freq: Every day | ORAL | Status: DC

## 2013-01-13 MED ORDER — METRONIDAZOLE 500 MG PO TABS: 500.0000 mg | ORAL_TABLET | Freq: Three times a day (TID) | ORAL | Status: DC

## 2013-01-13 NOTE — Progress Notes (Signed)
Patient given d/c instructions and prescriptions. IV cath removed and intact. No swelling or pain at site.

## 2013-01-13 NOTE — Discharge Summary (Signed)
Physician Discharge Summary  [name] GEX:[id number] DOB: February 27, 1946 DOA: 01/10/2013  PCP: [name], MD  Admit date: 01/10/2013 Discharge date: 01/13/2013  Time spent: 40 minutes  Recommendations for Outpatient Follow-up:  1. Dr. Nickola Major in 1 week to evaluate RA status and medication changes made during this hospitalization.  2. Dr. [name] in 1 week for follow up of symptoms. 3. Dr Josephina Gip: call office in 1 day to discuss the rescheduling of revascularization procedure  Discharge Diagnoses:  Principal Problem:   C. difficile diarrhea Active Problems:   HYPOTHYROIDISM   Peripheral vascular disease   GASTROESOPHAGEAL REFLUX DISEASE   Hypertension   Rheumatoid arthritis   Diabetes mellitus   Chronic lung disease   Dehydration   Discharge Condition: stable  Diet recommendation: bland  Filed Weights   01/10/13 0800 01/10/13 1554  Weight: 63.504 kg (140 lb) 67 kg (147 lb 11.3 oz)    History of present illness:  [name] is a 67 y.o. male who presented on 01/10/13 with watery diarrhea which started 01/09/13 evening. He reported having diarrhea for 2-3 weeks but not as significant as it was day before presentation. The diarrhea was associated with abdominal cramping. There had been nausea but no vomiting. He had not had a fever. Approximately one month prior he was put on 3 week course of antibiotics for prostatitis by his primary care physician. He came to the emergency room 2 days prior to presentation with a history of back pain. He was evaluated and apart from the back pain it was felt that he had a UTI. Therefore he was given ciprofloxacin by the emergency room physician. After he started the ciprofloxacin, profuse watery diarrhea started.There had been no rectal bleeding with the diarrhea. [facility] asked to admit as cdiff +.   Hospital Course:  C. difficile diarrhea: likely related to recent antibiotic use. CT negative for colitis. Afebrile, no white count. Stool  culture without suspicious colonies. Pt started on oral van and improved in 2 days. At discharge stool is formed and the usual amount. Tolerating bland diet. Will discharge with 7 days flagyl for total of 10 days of treatment. Recommend follow up with PCP in 1 week for symptom evaluation  Active Problems:  Acute renal failure related to #1 and decreased po intake. Given gentle IV hydration. At discharged resolved. Taking po's well .    Peripheral vascular disease; recently evaluated by Dr. Hart Rochester and ischemic left foot and painful left toe. S/p iliac stent 2009. To be scheduled for intervention. Family has been keeping office updated. Recommend calling office 01/14/13 to discuss rescheduling revascularization procedure.    GASTROESOPHAGEAL REFLUX DISEASE: stable at baseline. Tolerating bland diet. PPI discontinued secondary to #1. Concern as high risk with steroid use and NSAID use for PUD. Recommend close outpatient monitoring.   Hypertension: fair control during this hospitalization. Continue home meds   Reumatoid arthritis: severe and seems to be worse than baseline. Dr Lendell Caprice spoke to Dr. Nickola Major who agreed to increasing prednisone to 10mg  daily and discontinuing arava. At discharge pt much improved. Increased mobility. Decreased pain. Pt evaluated and recommend SNF but patient refused. Will be discharged with [facility] PT, RN SW. Recommend follow up with Dr Nickola Major in 1 week for evaluation.   Diabetes mellitus: controlled during hospitalization . Carb modified diet. No med   Dehydration: resolved. Likely related to #1. At discharge taking po fluids without problem   Procedures:  none  Consultations:  none  Discharge Exam:  Filed Vitals:   01/12/13 0441 01/12/13 2225 01/13/13 0558 01/13/13 1329  BP: 155/54 176/88 173/84 124/70  Pulse: 66 70 69 63  Temp: 97.8 F (36.6 C) 97.9 F (36.6 C) 98 F (36.7 C) 97.8 F (36.6 C)  TempSrc: Oral   Oral  Resp: 18 20 20 18   Height:      Weight:       SpO2: 100% 97% 97% 96%    General: up in chair smiling. Visiting family Cardiovascular: RRR No MGR No LE edema Left foot with small toe black and tender.  Respiratory: normal effort BS clear bilaterally no wheeze no rhonchi Abdomen: soft +BS non-tender to palpation.  Musculoskeletal: RA joints especially left elbow.   Discharge Instructions  Discharge Orders   Future Appointments Provider Department Dept Phone   02/09/2013 1:00 PM Kathlen Brunswick, MD [facility] [place] [phone]   Future Orders Complete By Expires     Call MD for:  difficulty breathing, headache or visual disturbances  As directed     Call MD for:  persistant nausea and vomiting  As directed     Diet - low sodium heart healthy  As directed     Discharge instructions  As directed     Comments:      Take flagyl as prescribed.  Try to minimize NSAID use No PPI as this increases risk for c diff.  Follow up with Dr. Lendon Colonel in 1 week    Increase activity slowly  As directed         Medication List    STOP taking these medications       ciprofloxacin 500 MG tablet  Commonly known as:  CIPRO     colchicine 0.6 MG tablet     leflunomide 20 MG tablet  Commonly known as:  ARAVA     omeprazole 20 MG capsule  Commonly known as:  PRILOSEC      TAKE these medications       acetaminophen 500 MG tablet  Commonly known as:  TYLENOL  Take 500 mg by mouth 2 (two) times daily.     aspirin EC 81 MG tablet  Take 81 mg by mouth daily.     atenolol 25 MG tablet  Commonly known as:  TENORMIN  Take 25 mg by mouth daily.     cholecalciferol 400 UNITS Tabs  Commonly known as:  VITAMIN D  Take 400 Units by mouth daily.     feeding supplement Liqd  Take 1 Container by mouth 3 (three) times daily between meals.     Fish Oil 500 MG Caps  Take 1 capsule by mouth daily.     folic acid 1 MG tablet  Commonly known as:  FOLVITE  Take 1 mg by mouth daily.     FORTEO 600 MCG/2.4ML Soln  Generic drug:   Teriparatide (Recombinant)  Inject 20 mcg into the skin.     HYDROcodone-acetaminophen 5-325 MG per tablet  Commonly known as:  NORCO/VICODIN  Take 1 tablet by mouth every 6 (six) hours as needed for pain.     ibuprofen 800 MG tablet  Commonly known as:  ADVIL,MOTRIN  Take 800 mg by mouth 3 (three) times daily.     levothyroxine 100 MCG tablet  Commonly known as:  SYNTHROID, LEVOTHROID  Take 1 tablet (100 mcg total) by mouth daily.     lisinopril 20 MG tablet  Commonly known as:  PRINIVIL,ZESTRIL  Take 20 mg by mouth daily.  metroNIDAZOLE 500 MG tablet  Commonly known as:  FLAGYL  Take 1 tablet (500 mg total) by mouth 3 (three) times daily.     pravastatin 80 MG tablet  Commonly known as:  PRAVACHOL  Take 80 mg by mouth daily with lunch.     predniSONE 10 MG tablet  Commonly known as:  DELTASONE  Take 1 tablet (10 mg total) by mouth daily.     triamcinolone 0.1 % cream : eucerin Crea  Apply 1 application topically 2 (two) times daily.     trolamine salicylate 10 % cream  Commonly known as:  ASPERCREME  Apply 1 application topically at bedtime as needed (for rheumatoid arthritis).     Vitamin D (Ergocalciferol) 50000 UNITS Caps  Commonly known as:  DRISDOL  Take 50,000 Units by mouth every 7 (seven) days. On Mondays          The results of significant diagnostics from this hospitalization (including imaging, microbiology, ancillary and laboratory) are listed below for reference.    Significant Diagnostic Studies: Ct Abdomen Pelvis Wo Contrast  01/10/2013  *RADIOLOGY REPORT*  Clinical Data: Abdominal pain, diarrhea.  Nausea.  CT ABDOMEN AND PELVIS WITHOUT CONTRAST  Technique:  Multidetector CT imaging of the abdomen and pelvis was performed following the standard protocol without intravenous contrast.  Comparison: 0427  Findings: Heart is upper limits normal in size.  Areas of scarring in the lung bases.  No acute opacities.  No effusions.  Gallstone again noted  within the gallbladder.  No CT evidence for acute cholecystitis.  Liver, spleen, pancreas, adrenals have an unremarkable unenhanced appearance.  Bilateral nonobstructing renal stones.  Small cysts in the right kidney.  No hydronephrosis or ureteral stones.  Appendix is visualized and is normal.  Large and small bowel are grossly unremarkable.  The no bowel wall thickening.  No free fluid, free air or adenopathy.  Urinary bladder and prostate grossly unremarkable.  Aorta and iliac vessels are calcified, non-aneurysmal.  IMPRESSION: Cholelithiasis.  No acute findings in the abdomen or pelvis.   Original Report Authenticated By: Charlett Nose, M.D.    Dg Lumbar Spine Complete  01/08/2013  *RADIOLOGY REPORT*  Clinical Data: right-sided back pain  LUMBAR SPINE - COMPLETE 4+ VIEW  Comparison: 11/02/2012  Findings: Left iliac artery stent noted.  There are surgical clips noted within the left upper quadrant of the abdomen.  Round calcified structure in the right upper quadrant of the abdomen is noted which may represent a gallstone.  Curvature of the lumbar spine is convex to the right.  There is mild multilevel disc space narrowing and ventral endplate spurring consistent with degenerative disc disease.  This is most severe at L4-5.  IMPRESSION:  1.  Scoliosis and degenerative disc disease.   Original Report Authenticated By: Signa Kell, M.D.     Microbiology: Recent Results (from the past 240 hour(s))  URINE CULTURE     Status: None   Collection Time    01/08/13  1:20 PM      Result Value Range Status   Specimen Description URINE, CLEAN CATCH   Final   Special Requests NONE   Final   Culture  Setup Time 01/08/2013 23:51   Final   Colony Count NO GROWTH   Final   Culture NO GROWTH   Final   Report Status 01/09/2013 FINAL   Final  CLOSTRIDIUM DIFFICILE BY PCR     Status: Abnormal   Collection Time    01/10/13 10:30 AM  Result Value Range Status   C difficile by pcr POSITIVE (*) NEGATIVE Final    Comment: RESULT CALLED TO, READ BACK BY AND VERIFIED WITH:     MEADOWS,G AT 1149 ON 01/10/2013 BY MOSLEY,J  STOOL CULTURE     Status: None   Collection Time    01/10/13 10:33 AM      Result Value Range Status   Specimen Description STOOL   Final   Special Requests Normal   Final   Culture     Final   Value: NO SUSPICIOUS COLONIES, CONTINUING TO HOLD     Note: REDUCED NORMAL FLORA PRESENT   Report Status PENDING   Incomplete     Labs: Basic Metabolic Panel:  Recent Labs Lab 01/10/13 0910 01/11/13 0513 01/12/13 0554 01/13/13 0535  NA 140 141 141 141  K 4.1 3.5 3.0* 3.4*  CL 111 113* 111 112  CO2 15* 16* 18* 19  GLUCOSE 79 81 87 89  BUN 26* 18 14 12   CREATININE 1.60* 1.30 1.09 0.99  CALCIUM 9.1 8.8 8.9 9.4   Liver Function Tests:  Recent Labs Lab 01/10/13 0910 01/11/13 0513  AST 95* 95*  ALT 109* 94*  ALKPHOS 139* 128*  BILITOT 0.5 0.6  PROT 7.3 6.4  ALBUMIN 3.4* 3.1*   No results found for this basename: LIPASE, AMYLASE,  in the last 168 hours No results found for this basename: AMMONIA,  in the last 168 hours CBC:  Recent Labs Lab 01/10/13 0910 01/11/13 0513 01/12/13 0554  WBC 9.3 6.5 7.7  NEUTROABS 5.5  --   --   HGB 12.4* 11.4* 11.9*  HCT 35.9* 32.5* 34.1*  MCV 85.5 85.1 84.8  PLT 166 152 153   Cardiac Enzymes: No results found for this basename: CKTOTAL, CKMB, CKMBINDEX, TROPONINI,  in the last 168 hours BNP: BNP (last 3 results) No results found for this basename: PROBNP,  in the last 8760 hours CBG:  Recent Labs Lab 01/12/13 1639  GLUCAP 158*       Signed:  BLACK,KAREN M  [facility] 01/13/2013, 2:48 PM  Seen and agree.  Crista Curb, M.D.

## 2013-01-13 NOTE — Clinical Social Work Note (Signed)
CSW talked to pt and family with CM at bedside. Pt has decided he wants to return home with home health and has significant family support. Pt is aware if he changes his mind after d/c he can contact [facility] who had offered a bed to him. CM discussed home health and equipment needs. MD notified. D/C today. CSW to sign off.  Derenda Fennel, [place] [phone]

## 2013-01-14 LAB — STOOL CULTURE

## 2013-01-15 ENCOUNTER — Inpatient Hospital Stay: Admit: 2013-01-15 | Payer: Self-pay | Admitting: Surgery

## 2013-01-15 SURGERY — CREATION, BYPASS, ARTERIAL, FEMORAL TO TIBIAL, USING GRAFT
Anesthesia: General | Site: Leg Lower | Laterality: Left

## 2013-01-18 ENCOUNTER — Telehealth: Payer: Self-pay

## 2013-01-18 NOTE — Telephone Encounter (Signed)
Pt. Called to request to reschedule surgery; Left Femoral- Posterior Bypass with possible Right LE vein harvest.  States he was discharged from the hospital last Wed., and is taking Flagyl for C-Diff.  Reports no diarrhea stool for 4 days.  C/o severe pain in left small toe, and describes toe as "black, dry and smells very bad".  Also, states "has some moisture between toes".  Denies fever or chills.  States he is scheduled to f/u with Dr. Gerda Diss tomorrow.  Advised pt. to check with Dr. Gerda Diss regarding question of moving forward with leg bypass.  Also, advised will send msg. to Dr. Myra Gianotti regarding rescheduling bypass surgery, and question of adding (L) small toe amputation to procedure.   Advised pt. will call him back after recommendation by Dr. Hermenia Bers with plan.

## 2013-01-19 ENCOUNTER — Encounter: Payer: Self-pay | Admitting: Family Medicine

## 2013-01-19 ENCOUNTER — Ambulatory Visit (INDEPENDENT_AMBULATORY_CARE_PROVIDER_SITE_OTHER): Payer: Medicare Other | Admitting: Family Medicine

## 2013-01-19 VITALS — BP 130/82 | Wt 142.4 lb

## 2013-01-19 DIAGNOSIS — E876 Hypokalemia: Secondary | ICD-10-CM

## 2013-01-19 DIAGNOSIS — A0472 Enterocolitis due to Clostridium difficile, not specified as recurrent: Secondary | ICD-10-CM

## 2013-01-19 NOTE — Progress Notes (Signed)
  Subjective:    Patient ID: [name], male    DOB: [birth year], 67 y.o.   MRN: [id number]  HPI  Patient arrives office for followup. States overall feeling better. Was in the hospital with C. difficile colitis. He had significant dehydration. He had low potassium. Patient was placed on vancomycin in the hospital. He now is on Flagyl and is almost finished with this. Reports progressive pain in his left foot and leg. Due to have amputation with gangrenous toes sent. No fever or chills. States bowel movements back to him normal. No chest pain no shortness of breath. Review systems otherwise negative. Alert  Review of Systems     Objective:   Physical Exam  Alert no acute distress. Lungs clear. Heart regular in rhythm. HEENT normal. Left hip some pain with rotation. Distal leg edematous. Abdomen hyperactive bowel sounds no rebound no guarding.      Assessment & Plan:  Impression #1 C. difficile colitis clinically improved. #2 hypokalemia resolved. #3 chronic pain worsening with gangrenous foot. #4 presurgical clearance. We called and spoke with patient's surgeon's nurse. Advised him that patient is back to baseline and may need to press on with surgery when able. Plan finish Flagyl. Oxycodone #60 tablets 10/325 one every 4 when necessary. Easily 25 minutes spent most in discussion. WSL

## 2013-01-20 ENCOUNTER — Other Ambulatory Visit: Payer: Self-pay

## 2013-01-20 NOTE — Telephone Encounter (Signed)
Rec'd verbal order from Dr. Myra Gianotti to schedule pt. For left leg bypass surgery on 01/28/13.  Called pt. and scheduled for Left Fem-Posterior Tibial BP, with possible Right LE Vein Harvest on 01/28/13.  Advised pt. to expect a phone call from the [facility] Pre-Op Dept. To schedule appt. For Pre-admission testing prior to 01/28/13.  Agrees with plan.

## 2013-01-21 ENCOUNTER — Encounter (HOSPITAL_COMMUNITY): Payer: Self-pay | Admitting: Pharmacy Technician

## 2013-01-21 DIAGNOSIS — I739 Peripheral vascular disease, unspecified: Secondary | ICD-10-CM

## 2013-01-22 ENCOUNTER — Encounter (HOSPITAL_COMMUNITY): Payer: Self-pay

## 2013-01-22 ENCOUNTER — Ambulatory Visit: Payer: Medicare Other | Admitting: Family Medicine

## 2013-01-22 ENCOUNTER — Encounter (HOSPITAL_COMMUNITY)
Admission: RE | Admit: 2013-01-22 | Discharge: 2013-01-22 | Disposition: A | Discharge status: Home / Self Care | Payer: Medicare Other | Source: Ambulatory Visit | Attending: Surgery | Admitting: Surgery

## 2013-01-22 LAB — TYPE AND SCREEN
ABO/RH(D): A POS
Antibody Screen: NEGATIVE

## 2013-01-22 LAB — CBC
MCHC: 34.2 g/dL (ref 30.0–36.0)
Platelets: 282 10*3/uL (ref 150–400)
RDW: 16.4 % — ABNORMAL HIGH (ref 11.5–15.5)

## 2013-01-22 LAB — COMPREHENSIVE METABOLIC PANEL
ALT: 38 U/L (ref 0–53)
Albumin: 3.6 g/dL (ref 3.5–5.2)
Alkaline Phosphatase: 105 U/L (ref 39–117)
Calcium: 9.1 mg/dL (ref 8.4–10.5)
GFR calc Af Amer: 63 mL/min — ABNORMAL LOW (ref >90)
Glucose, Bld: 135 mg/dL — ABNORMAL HIGH (ref 70–99)
Potassium: 5.7 mEq/L — ABNORMAL HIGH (ref 3.5–5.1)
Sodium: 134 mEq/L — ABNORMAL LOW (ref 135–145)
Total Protein: 7.3 g/dL (ref 6.0–8.3)

## 2013-01-22 LAB — URINALYSIS, ROUTINE W REFLEX MICROSCOPIC
Bilirubin Urine: NEGATIVE
Hgb urine dipstick: NEGATIVE
Nitrite: NEGATIVE
Protein, ur: NEGATIVE mg/dL
Specific Gravity, Urine: 1.009 (ref 1.005–1.030)
Urobilinogen, UA: 0.2 mg/dL (ref 0.0–1.0)

## 2013-01-22 LAB — APTT: aPTT: 27 seconds (ref 24–37)

## 2013-01-22 LAB — PROTIME-INR: Prothrombin Time: 13.2 seconds (ref 11.6–15.2)

## 2013-01-22 NOTE — Progress Notes (Signed)
Abnl CBC;WBC 17.2; [name], RN @ [facility] notified.

## 2013-01-22 NOTE — Pre-Procedure Instructions (Signed)
[name]  01/22/2013   Your procedure is scheduled on:  Thursday, May 8  Report to [facility] at 0730 AM.  Call this number if you have problems the morning of surgery: [phone]   Remember:   Do not eat food or drink liquids after midnight.Wednesday night   Take these medicines the morning of surgery with A SIP OF WATER: Atenolol,Levothyroxine,Omeprazole,Prednisone, hydrocodone, if needed   Do not wear jewelry, make-up or nail polish.  Do not wear lotions, powders, or perfumes. You may wear deodorant.  Do not shave 48 hours prior to surgery. Men may shave face and neck.  Do not bring valuables to the hospital.  Contacts, dentures or bridgework may not be worn into surgery.  Leave suitcase in the car. After surgery it may be brought to your room.  For patients admitted to the hospital, checkout time is 11:00 AM the day of  discharge.   Special Instructions: Shower using CHG 2 nights before surgery and the night before surgery.  If you shower the day of surgery use CHG.  Use special wash - you have one bottle of CHG for all showers.  You should use approximately 1/3 of the bottle for each shower.   Please read over the following fact sheets that you were given: Pain Booklet, Coughing and Deep Breathing, Blood Transfusion Information and Surgical Site Infection Prevention

## 2013-01-25 ENCOUNTER — Encounter (HOSPITAL_COMMUNITY): Payer: Self-pay

## 2013-01-25 NOTE — Progress Notes (Signed)
Anesthesia chart review: Patient is a 67 year old male scheduled for left femoral posterior tibial bypass with possible right lower extremity vein harvest on 01/28/2013 by Dr. Myra Gianotti.  Surgery was initially scheduled for 01/15/13, but was postponed due to treatment for C. difficile colitis.  History includes C. 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  PCP is Dr. Lilyan Punt who medically cleared patient for surgery.  Cardiologist is Dr. Gully Bing, last visit 08/12/12 and cleared patient for an ORIF of LLE fracture at that time.  Patient is scheduled for routine cardiology follow-up on 02/09/13.  Nuclear stress test on 06/14/11 showed: Abnormal pharmacologic stress nuclear myocardial study revealing a nondiagnostic stress EKG due to the presence of baseline ST-segment abnormalities, normal left ventricular size and normal overall left ventricular systolic function with a segmental wall motion abnormality as described. By scintigraphic imaging, there appeared to be mild scarring at the base of the inferoseptal segment with minimal superimposed ischemia. The possibility of variable diaphragmatic attenuation causing these findings should be considered. Although abnormal, these study finding predict a low risk for perioperative cardiac complications. Other findings as noted. (Results reviewed by Dr. [name] who felt, "Normal or stable results.  No change in medical therapy."    Echo on 09/27/10 showed: Left ventricle: The cavity size was normal. Wall thickness was increased in a pattern of mild LVH. Systolic function was normal. The estimated ejection fraction was in the range of 60% to 65%. Features are consistent  with a pseudonormal left ventricular filling pattern, with concomitant abnormal relaxation and increased filling pressure (grade 2 diastolic dysfunction). Trivial PR.  Cardiac cath on 11/02/07 showed "progression of native disease, but no significant occlusions, patent LIMA to LAD and SVG to OM1/2, chronic occlusion of SVG to PDA/RCA, EF 60%, medical therapy."  (See Notes tab for full report.)  EKG on 01/01/13 showed NSR, moderate LVH, inferior infarct (age undetermined).  Lateral ST changes have improved since 09/01/12.  CXR on 08/28/12 showed no acute cardiopulmonary disease.   Preoperative labs show a WBC 17.2 (up from 7.7 on 01/12/13), K+ 5.7.  Cr 1.33.  Negative UA.  PT/PTT WNL.  [name], RN reviewed abnormal labs with Dr. Myra Gianotti.  He thinks the leukocytosis is coming from patient's foot/toe wounds and that this procedure needs to be done as scheduled--if not sooner.  He agrees with rechecking a K+ on the day of surgery, but did not recommend any additional lab testing.  I've ordered a STAT repeat BMET on arrival.    Since patient was recently evaluated by his PCP and medically cleared,and patient was cleared for another surgical procedure six months ago by his cardiologist then I would anticipate he could proceed as planned if no acute changes in his CV status and if follow-up labs are acceptable.  He will be further evaluated by Dr. Myra Gianotti and his assigned anesthesiologist on the day of surgery to ensure no new changes/symptomology.  Anesthesiologist Dr. Katrinka Blazing agrees with this plan.    Velna Ochs [facility] Stay [facility]/Anesthesiology Phone [phone] 01/25/2013 5:45 PM

## 2013-01-27 MED ORDER — SODIUM CHLORIDE 0.9 % IV SOLN
INTRAVENOUS | Status: DC
  Administered 2013-01-28: 10:00:00 via INTRAVENOUS

## 2013-01-27 MED ORDER — VANCOMYCIN HCL IN DEXTROSE 1-5 GM/200ML-% IV SOLN
1000.0000 mg | INTRAVENOUS | Status: AC
  Administered 2013-01-28: 1000 mg via INTRAVENOUS
  Filled 2013-01-27: qty 200

## 2013-01-28 ENCOUNTER — Encounter (HOSPITAL_COMMUNITY): Payer: Self-pay | Admitting: Certified Registered"

## 2013-01-28 ENCOUNTER — Encounter (HOSPITAL_COMMUNITY): Payer: Self-pay | Admitting: Vascular Surgery

## 2013-01-28 ENCOUNTER — Inpatient Hospital Stay (HOSPITAL_COMMUNITY): Payer: Medicare Other | Admitting: Certified Registered"

## 2013-01-28 ENCOUNTER — Encounter (HOSPITAL_COMMUNITY)
Admission: RE | Disposition: A | Discharge status: Home Health | Payer: Self-pay | Source: Ambulatory Visit | Attending: Surgery

## 2013-01-28 ENCOUNTER — Inpatient Hospital Stay (HOSPITAL_COMMUNITY)
Admission: RE | Admit: 2013-01-28 | Discharge: 2013-01-31 | DRG: 254 | Disposition: A | Discharge status: Home Health | Payer: Medicare Other | Source: Ambulatory Visit | Attending: Surgery | Admitting: Surgery

## 2013-01-28 DIAGNOSIS — I70269 Atherosclerosis of native arteries of extremities with gangrene, unspecified extremity: Principal | ICD-10-CM | POA: Diagnosis present

## 2013-01-28 DIAGNOSIS — K449 Diaphragmatic hernia without obstruction or gangrene: Secondary | ICD-10-CM | POA: Diagnosis present

## 2013-01-28 DIAGNOSIS — Z951 Presence of aortocoronary bypass graft: Secondary | ICD-10-CM

## 2013-01-28 DIAGNOSIS — Z981 Arthrodesis status: Secondary | ICD-10-CM

## 2013-01-28 DIAGNOSIS — E785 Hyperlipidemia, unspecified: Secondary | ICD-10-CM | POA: Diagnosis present

## 2013-01-28 DIAGNOSIS — K219 Gastro-esophageal reflux disease without esophagitis: Secondary | ICD-10-CM | POA: Diagnosis present

## 2013-01-28 DIAGNOSIS — Z96659 Presence of unspecified artificial knee joint: Secondary | ICD-10-CM

## 2013-01-28 DIAGNOSIS — I2510 Atherosclerotic heart disease of native coronary artery without angina pectoris: Secondary | ICD-10-CM | POA: Diagnosis present

## 2013-01-28 DIAGNOSIS — Z87891 Personal history of nicotine dependence: Secondary | ICD-10-CM

## 2013-01-28 DIAGNOSIS — E875 Hyperkalemia: Secondary | ICD-10-CM | POA: Diagnosis present

## 2013-01-28 DIAGNOSIS — I10 Essential (primary) hypertension: Secondary | ICD-10-CM | POA: Diagnosis present

## 2013-01-28 DIAGNOSIS — I739 Peripheral vascular disease, unspecified: Secondary | ICD-10-CM

## 2013-01-28 DIAGNOSIS — E039 Hypothyroidism, unspecified: Secondary | ICD-10-CM | POA: Diagnosis present

## 2013-01-28 HISTORY — PX: FEMORAL-TIBIAL BYPASS GRAFT: SHX938

## 2013-01-28 HISTORY — PX: AMPUTATION: SHX166

## 2013-01-28 LAB — GLUCOSE, CAPILLARY: Glucose-Capillary: 124 mg/dL — ABNORMAL HIGH (ref 70–99)

## 2013-01-28 LAB — BASIC METABOLIC PANEL
BUN: 36 mg/dL — ABNORMAL HIGH (ref 6–23)
CO2: 20 mEq/L (ref 19–32)
CO2: 20 mEq/L (ref 19–32)
Chloride: 108 mEq/L (ref 96–112)
Chloride: 108 mEq/L (ref 96–112)
Creatinine, Ser: 1.47 mg/dL — ABNORMAL HIGH (ref 0.50–1.35)
GFR calc Af Amer: 77 mL/min — ABNORMAL LOW (ref >90)
Glucose, Bld: 109 mg/dL — ABNORMAL HIGH (ref 70–99)
Potassium: 5.9 mEq/L — ABNORMAL HIGH (ref 3.5–5.1)
Potassium: 5.7 mEq/L — ABNORMAL HIGH (ref 3.5–5.1)

## 2013-01-28 LAB — CBC
HCT: 34.9 % — ABNORMAL LOW (ref 39.0–52.0)
Hemoglobin: 11.8 g/dL — ABNORMAL LOW (ref 13.0–17.0)
MCH: 30.3 pg (ref 26.0–34.0)
MCHC: 33.8 g/dL (ref 30.0–36.0)
MCV: 89.7 fL (ref 78.0–100.0)

## 2013-01-28 LAB — POCT I-STAT 4, (NA,K, GLUC, HGB,HCT): Sodium: 139 mEq/L (ref 135–145)

## 2013-01-28 LAB — CREATININE, SERUM: GFR calc non Af Amer: 68 mL/min — ABNORMAL LOW (ref >90)

## 2013-01-28 SURGERY — CREATION, BYPASS, ARTERIAL, FEMORAL TO TIBIAL, USING GRAFT
Anesthesia: General | Site: Toe | Laterality: Left | Wound class: Clean

## 2013-01-28 MED ORDER — DEXTROSE 50 % IV SOLN
INTRAVENOUS | Status: DC | PRN
  Administered 2013-01-28: 12.5 g via INTRAVENOUS

## 2013-01-28 MED ORDER — LEVOTHYROXINE SODIUM 100 MCG PO TABS
100.0000 ug | ORAL_TABLET | Freq: Every day | ORAL | Status: DC
  Administered 2013-01-29 – 2013-01-31 (×3): 100 ug via ORAL
  Filled 2013-01-28 (×4): qty 1

## 2013-01-28 MED ORDER — ACETAMINOPHEN 650 MG RE SUPP: 325.0000 mg | RECTAL | Status: DC | PRN

## 2013-01-28 MED ORDER — LIDOCAINE HCL (CARDIAC) 20 MG/ML IV SOLN
INTRAVENOUS | Status: DC | PRN
  Administered 2013-01-28: 50 mg via INTRAVENOUS

## 2013-01-28 MED ORDER — LISINOPRIL 20 MG PO TABS
20.0000 mg | ORAL_TABLET | Freq: Every day | ORAL | Status: DC
  Administered 2013-01-30 – 2013-01-31 (×2): 20 mg via ORAL
  Filled 2013-01-28 (×3): qty 1

## 2013-01-28 MED ORDER — OXYCODONE HCL 5 MG PO TABS: 5.0000 mg | ORAL_TABLET | Freq: Once | ORAL | Status: DC | PRN

## 2013-01-28 MED ORDER — PANTOPRAZOLE SODIUM 40 MG PO TBEC: 40.0000 mg | DELAYED_RELEASE_TABLET | Freq: Every day | ORAL | Status: DC

## 2013-01-28 MED ORDER — GLYCOPYRROLATE 0.2 MG/ML IJ SOLN
INTRAMUSCULAR | Status: DC | PRN
  Administered 2013-01-28: 0.4 mg via INTRAVENOUS

## 2013-01-28 MED ORDER — MEPERIDINE HCL 25 MG/ML IJ SOLN: 6.2500 mg | INTRAMUSCULAR | Status: DC | PRN

## 2013-01-28 MED ORDER — PHENOL 1.4 % MT LIQD: 1.0000 | OROMUCOSAL | Status: DC | PRN

## 2013-01-28 MED ORDER — NALOXONE HCL 0.4 MG/ML IJ SOLN: 0.4000 mg | INTRAMUSCULAR | Status: DC | PRN

## 2013-01-28 MED ORDER — ONDANSETRON HCL 4 MG/2ML IJ SOLN
INTRAMUSCULAR | Status: AC
  Filled 2013-01-28: qty 2

## 2013-01-28 MED ORDER — SODIUM CHLORIDE 0.9 % IV SOLN
INTRAVENOUS | Status: DC
  Administered 2013-01-29: 01:00:00 via INTRAVENOUS

## 2013-01-28 MED ORDER — SODIUM CHLORIDE 0.9 % IR SOLN
Status: DC | PRN
  Administered 2013-01-28: 11:00:00

## 2013-01-28 MED ORDER — INSULIN REGULAR HUMAN 100 UNIT/ML IJ SOLN
5.0000 [IU] | Freq: Once | INTRAMUSCULAR | Status: DC
  Filled 2013-01-28: qty 0.05

## 2013-01-28 MED ORDER — PAPAVERINE HCL 30 MG/ML IJ SOLN
INTRAMUSCULAR | Status: AC
  Filled 2013-01-28: qty 2

## 2013-01-28 MED ORDER — SODIUM BICARBONATE 8.4 % IV SOLN
INTRAVENOUS | Status: DC | PRN
  Administered 2013-01-28: 15 mL via INTRAVENOUS

## 2013-01-28 MED ORDER — BACITRACIN ZINC 500 UNIT/GM EX OINT
TOPICAL_OINTMENT | CUTANEOUS | Status: AC
  Filled 2013-01-28: qty 15

## 2013-01-28 MED ORDER — LABETALOL HCL 5 MG/ML IV SOLN
10.0000 mg | INTRAVENOUS | Status: DC | PRN
  Filled 2013-01-28: qty 4

## 2013-01-28 MED ORDER — VANCOMYCIN HCL IN DEXTROSE 750-5 MG/150ML-% IV SOLN
750.0000 mg | Freq: Two times a day (BID) | INTRAVENOUS | Status: AC
  Administered 2013-01-28 – 2013-01-29 (×2): 750 mg via INTRAVENOUS
  Filled 2013-01-28 (×2): qty 150

## 2013-01-28 MED ORDER — PROMETHAZINE HCL 25 MG/ML IJ SOLN: 6.2500 mg | INTRAMUSCULAR | Status: DC | PRN

## 2013-01-28 MED ORDER — ONDANSETRON HCL 4 MG/2ML IJ SOLN
4.0000 mg | Freq: Four times a day (QID) | INTRAMUSCULAR | Status: DC | PRN
  Administered 2013-01-28 (×2): 4 mg via INTRAVENOUS
  Filled 2013-01-28: qty 2

## 2013-01-28 MED ORDER — HYDROCORTISONE SOD SUCCINATE 100 MG IJ SOLR
INTRAMUSCULAR | Status: DC | PRN
  Administered 2013-01-28: 100 mg via INTRAVENOUS

## 2013-01-28 MED ORDER — LACTATED RINGERS IV SOLN
INTRAVENOUS | Status: DC
  Administered 2013-01-28: 09:00:00 via INTRAVENOUS

## 2013-01-28 MED ORDER — DOCUSATE SODIUM 100 MG PO CAPS
100.0000 mg | ORAL_CAPSULE | Freq: Every day | ORAL | Status: DC
  Administered 2013-01-29 – 2013-01-31 (×3): 100 mg via ORAL
  Filled 2013-01-28 (×3): qty 1

## 2013-01-28 MED ORDER — POTASSIUM CHLORIDE CRYS ER 20 MEQ PO TBCR: 20.0000 meq | EXTENDED_RELEASE_TABLET | Freq: Every day | ORAL | Status: DC | PRN

## 2013-01-28 MED ORDER — SODIUM CHLORIDE 0.9 % IJ SOLN: 9.0000 mL | INTRAMUSCULAR | Status: DC | PRN

## 2013-01-28 MED ORDER — ATENOLOL 25 MG PO TABS
25.0000 mg | ORAL_TABLET | Freq: Every day | ORAL | Status: DC
  Administered 2013-01-30 – 2013-01-31 (×2): 25 mg via ORAL
  Filled 2013-01-28 (×3): qty 1

## 2013-01-28 MED ORDER — PROPOFOL 10 MG/ML IV BOLUS
INTRAVENOUS | Status: DC | PRN
  Administered 2013-01-28: 130 mg via INTRAVENOUS

## 2013-01-28 MED ORDER — HYDROMORPHONE HCL PF 1 MG/ML IJ SOLN: 0.2500 mg | INTRAMUSCULAR | Status: DC | PRN

## 2013-01-28 MED ORDER — DEXTROSE 50 % IV SOLN: 1.0000 | Freq: Once | INTRAVENOUS | Status: DC

## 2013-01-28 MED ORDER — BACITRACIN ZINC 500 UNIT/GM EX OINT
TOPICAL_OINTMENT | CUTANEOUS | Status: DC | PRN
  Administered 2013-01-28: 1 via TOPICAL

## 2013-01-28 MED ORDER — ENOXAPARIN SODIUM 30 MG/0.3ML ~~LOC~~ SOLN
30.0000 mg | SUBCUTANEOUS | Status: DC
  Administered 2013-01-29 – 2013-01-30 (×2): 30 mg via SUBCUTANEOUS
  Filled 2013-01-28 (×3): qty 0.3

## 2013-01-28 MED ORDER — INSULIN REGULAR HUMAN 100 UNIT/ML IJ SOLN
5.0000 [IU] | Freq: Once | INTRAMUSCULAR | Status: DC
  Administered 2013-01-28: 5 [IU] via SUBCUTANEOUS
  Filled 2013-01-28: qty 0.05

## 2013-01-28 MED ORDER — PREDNISONE 10 MG PO TABS
10.0000 mg | ORAL_TABLET | Freq: Every day | ORAL | Status: DC
  Administered 2013-01-29 – 2013-01-31 (×3): 10 mg via ORAL
  Filled 2013-01-28 (×3): qty 1

## 2013-01-28 MED ORDER — FOLIC ACID 1 MG PO TABS
1.0000 mg | ORAL_TABLET | Freq: Every day | ORAL | Status: DC
  Administered 2013-01-29 – 2013-01-31 (×3): 1 mg via ORAL
  Filled 2013-01-28 (×3): qty 1

## 2013-01-28 MED ORDER — SODIUM CHLORIDE 0.9 % IV SOLN
500.0000 mL | Freq: Once | INTRAVENOUS | Status: AC | PRN
  Administered 2013-01-29: 500 mL via INTRAVENOUS

## 2013-01-28 MED ORDER — LACTATED RINGERS IV SOLN
INTRAVENOUS | Status: DC | PRN
  Administered 2013-01-28 (×3): via INTRAVENOUS

## 2013-01-28 MED ORDER — PHENYLEPHRINE HCL 10 MG/ML IJ SOLN
INTRAMUSCULAR | Status: DC | PRN
  Administered 2013-01-28 (×2): 80 ug via INTRAVENOUS
  Administered 2013-01-28: 40 ug via INTRAVENOUS
  Administered 2013-01-28: 120 ug via INTRAVENOUS
  Administered 2013-01-28: 80 ug via INTRAVENOUS

## 2013-01-28 MED ORDER — HYDROCODONE-ACETAMINOPHEN 10-325 MG PO TABS
1.0000 | ORAL_TABLET | ORAL | Status: DC | PRN
  Administered 2013-01-28 – 2013-01-29 (×3): 1 via ORAL
  Filled 2013-01-28 (×3): qty 1

## 2013-01-28 MED ORDER — ONDANSETRON HCL 4 MG/2ML IJ SOLN: 4.0000 mg | Freq: Four times a day (QID) | INTRAMUSCULAR | Status: DC | PRN

## 2013-01-28 MED ORDER — TROLAMINE SALICYLATE 10 % EX CREA
1.0000 "application " | TOPICAL_CREAM | Freq: Every evening | CUTANEOUS | Status: DC | PRN
  Filled 2013-01-28: qty 85

## 2013-01-28 MED ORDER — 0.9 % SODIUM CHLORIDE (POUR BTL) OPTIME
TOPICAL | Status: DC | PRN
  Administered 2013-01-28 (×2): 1000 mL

## 2013-01-28 MED ORDER — DIPHENHYDRAMINE HCL 12.5 MG/5ML PO ELIX
12.5000 mg | ORAL_SOLUTION | Freq: Four times a day (QID) | ORAL | Status: DC | PRN
  Filled 2013-01-28: qty 5

## 2013-01-28 MED ORDER — HYDROMORPHONE HCL PF 1 MG/ML IJ SOLN
INTRAMUSCULAR | Status: AC
  Administered 2013-01-28: 0.25 mg via INTRAVENOUS
  Filled 2013-01-28: qty 1

## 2013-01-28 MED ORDER — ACETAMINOPHEN 325 MG PO TABS: 325.0000 mg | ORAL_TABLET | ORAL | Status: DC | PRN

## 2013-01-28 MED ORDER — ONDANSETRON HCL 4 MG/2ML IJ SOLN
INTRAMUSCULAR | Status: DC | PRN
  Administered 2013-01-28: 4 mg via INTRAVENOUS

## 2013-01-28 MED ORDER — ASPIRIN EC 81 MG PO TBEC
81.0000 mg | DELAYED_RELEASE_TABLET | Freq: Every day | ORAL | Status: DC
  Administered 2013-01-28 – 2013-01-31 (×4): 81 mg via ORAL
  Filled 2013-01-28 (×4): qty 1

## 2013-01-28 MED ORDER — HYDRALAZINE HCL 20 MG/ML IJ SOLN: 10.0000 mg | INTRAMUSCULAR | Status: DC | PRN

## 2013-01-28 MED ORDER — HEPARIN SODIUM (PORCINE) 1000 UNIT/ML IJ SOLN
INTRAMUSCULAR | Status: DC | PRN
  Administered 2013-01-28: 8000 [IU] via INTRAVENOUS
  Administered 2013-01-28: 3000 [IU] via INTRAVENOUS

## 2013-01-28 MED ORDER — SIMVASTATIN 40 MG PO TABS
40.0000 mg | ORAL_TABLET | Freq: Every day | ORAL | Status: DC
  Administered 2013-01-28 – 2013-01-30 (×3): 40 mg via ORAL
  Filled 2013-01-28 (×4): qty 1

## 2013-01-28 MED ORDER — EPHEDRINE SULFATE 50 MG/ML IJ SOLN
INTRAMUSCULAR | Status: DC | PRN
  Administered 2013-01-28 (×5): 10 mg via INTRAVENOUS

## 2013-01-28 MED ORDER — FENTANYL CITRATE 0.05 MG/ML IJ SOLN
INTRAMUSCULAR | Status: DC | PRN
  Administered 2013-01-28 (×8): 50 ug via INTRAVENOUS
  Administered 2013-01-28: 100 ug via INTRAVENOUS

## 2013-01-28 MED ORDER — HYDROMORPHONE 0.3 MG/ML IV SOLN
INTRAVENOUS | Status: DC
  Administered 2013-01-28: via INTRAVENOUS
  Administered 2013-01-29: 4.4 mg via INTRAVENOUS
  Administered 2013-01-29: 08:00:00 via INTRAVENOUS
  Administered 2013-01-29: 2.7 mg via INTRAVENOUS
  Filled 2013-01-28 (×2): qty 25

## 2013-01-28 MED ORDER — PROTAMINE SULFATE 10 MG/ML IV SOLN
INTRAVENOUS | Status: DC | PRN
  Administered 2013-01-28: 10 mg via INTRAVENOUS
  Administered 2013-01-28: 40 mg via INTRAVENOUS

## 2013-01-28 MED ORDER — OXYCODONE HCL 5 MG/5ML PO SOLN: 5.0000 mg | Freq: Once | ORAL | Status: DC | PRN

## 2013-01-28 MED ORDER — MIDAZOLAM HCL 5 MG/5ML IJ SOLN
INTRAMUSCULAR | Status: DC | PRN
  Administered 2013-01-28: 2 mg via INTRAVENOUS

## 2013-01-28 MED ORDER — PANTOPRAZOLE SODIUM 40 MG PO TBEC
40.0000 mg | DELAYED_RELEASE_TABLET | Freq: Every day | ORAL | Status: DC
  Administered 2013-01-29 – 2013-01-31 (×3): 40 mg via ORAL
  Filled 2013-01-28 (×2): qty 1

## 2013-01-28 MED ORDER — NEOSTIGMINE METHYLSULFATE 1 MG/ML IJ SOLN
INTRAMUSCULAR | Status: DC | PRN
  Administered 2013-01-28: 3 mg via INTRAVENOUS

## 2013-01-28 MED ORDER — DIPHENHYDRAMINE HCL 50 MG/ML IJ SOLN: 12.5000 mg | Freq: Four times a day (QID) | INTRAMUSCULAR | Status: DC | PRN

## 2013-01-28 MED ORDER — MORPHINE SULFATE 2 MG/ML IJ SOLN
2.0000 mg | INTRAMUSCULAR | Status: DC | PRN
  Administered 2013-01-28 (×2): 2 mg via INTRAVENOUS
  Administered 2013-01-28: 4 mg via INTRAVENOUS
  Filled 2013-01-28: qty 1
  Filled 2013-01-28: qty 2
  Filled 2013-01-28: qty 1

## 2013-01-28 MED ORDER — INSULIN REGULAR HUMAN 100 UNIT/ML IJ SOLN: 5.0000 [IU] | Freq: Once | INTRAMUSCULAR | Status: DC

## 2013-01-28 MED ORDER — ROCURONIUM BROMIDE 100 MG/10ML IV SOLN
INTRAVENOUS | Status: DC | PRN
  Administered 2013-01-28: 40 mg via INTRAVENOUS
  Administered 2013-01-28: 10 mg via INTRAVENOUS

## 2013-01-28 MED ORDER — TERIPARATIDE (RECOMBINANT) 600 MCG/2.4ML ~~LOC~~ SOLN: 2.4000 mL | Freq: Every day | SUBCUTANEOUS | Status: DC

## 2013-01-28 MED ORDER — METOPROLOL TARTRATE 1 MG/ML IV SOLN: 2.0000 mg | INTRAVENOUS | Status: DC | PRN

## 2013-01-28 MED ORDER — LEVOTHYROXINE SODIUM 100 MCG PO TABS: 100.0000 ug | ORAL_TABLET | Freq: Every day | ORAL | Status: DC

## 2013-01-28 SURGICAL SUPPLY — 86 items
ADH SKN CLS APL DERMABOND .7 (GAUZE/BANDAGES/DRESSINGS) ×15
BAG ISL DRAPE 18X18 STRL (DRAPES) ×6
BAG ISOLATION DRAPE 18X18 (DRAPES) IMPLANT
BAG SNAP BAND KOVER 36X36 (MISCELLANEOUS) ×1 IMPLANT
BANDAGE ELASTIC 4 VELCRO ST LF (GAUZE/BANDAGES/DRESSINGS) ×1 IMPLANT
BANDAGE ESMARK 6X9 LF (GAUZE/BANDAGES/DRESSINGS) IMPLANT
BANDAGE GAUZE ELAST BULKY 4 IN (GAUZE/BANDAGES/DRESSINGS) ×1 IMPLANT
BLADE SURG ROTATE 9660 (MISCELLANEOUS) ×1 IMPLANT
BNDG CMPR 9X6 STRL LF SNTH (GAUZE/BANDAGES/DRESSINGS) ×3
BNDG ESMARK 6X9 LF (GAUZE/BANDAGES/DRESSINGS) ×4
CANISTER SUCTION 2500CC (MISCELLANEOUS) ×4 IMPLANT
CATH EMB 4FR 40CM (CATHETERS) ×1 IMPLANT
CLIP TI MEDIUM 24 (CLIP) ×4 IMPLANT
CLIP TI WIDE RED SMALL 24 (CLIP) ×4 IMPLANT
CLOTH BEACON ORANGE TIMEOUT ST (SAFETY) ×4 IMPLANT
COVER DOME SNAP 22 D (MISCELLANEOUS) ×1 IMPLANT
COVER PROBE W GEL 5X96 (DRAPES) ×1 IMPLANT
COVER SURGICAL LIGHT HANDLE (MISCELLANEOUS) ×4 IMPLANT
CUFF TOURNIQUET SINGLE 24IN (TOURNIQUET CUFF) IMPLANT
CUFF TOURNIQUET SINGLE 34IN LL (TOURNIQUET CUFF) ×1 IMPLANT
CUFF TOURNIQUET SINGLE 44IN (TOURNIQUET CUFF) IMPLANT
DERMABOND ADVANCED (GAUZE/BANDAGES/DRESSINGS) ×5
DERMABOND ADVANCED .7 DNX12 (GAUZE/BANDAGES/DRESSINGS) ×3 IMPLANT
DRAIN CHANNEL 15F RND FF W/TCR (WOUND CARE) IMPLANT
DRAPE ISOLATION BAG 18X18 (DRAPES) ×2
DRAPE WARM FLUID 44X44 (DRAPE) ×4 IMPLANT
DRAPE X-RAY CASS 24X20 (DRAPES) IMPLANT
DRSG COVADERM 4X10 (GAUZE/BANDAGES/DRESSINGS) IMPLANT
DRSG COVADERM 4X8 (GAUZE/BANDAGES/DRESSINGS) IMPLANT
E-Z CLEAN BOVIE TIP ×1 IMPLANT
ELECT REM PT RETURN 9FT ADLT (ELECTROSURGICAL) ×8
ELECTRODE REM PT RTRN 9FT ADLT (ELECTROSURGICAL) ×3 IMPLANT
EVACUATOR SILICONE 100CC (DRAIN) IMPLANT
GLOVE BIO SURGEON STRL SZ 6.5 (GLOVE) ×1 IMPLANT
GLOVE BIOGEL PI IND STRL 6.5 (GLOVE) IMPLANT
GLOVE BIOGEL PI IND STRL 7.0 (GLOVE) IMPLANT
GLOVE BIOGEL PI IND STRL 7.5 (GLOVE) ×3 IMPLANT
GLOVE BIOGEL PI INDICATOR 6.5 (GLOVE) ×2
GLOVE BIOGEL PI INDICATOR 7.0 (GLOVE) ×2
GLOVE BIOGEL PI INDICATOR 7.5 (GLOVE) ×3
GLOVE ECLIPSE 7.5 STRL STRAW (GLOVE) ×2 IMPLANT
GLOVE SS BIOGEL STRL SZ 6.5 (GLOVE) IMPLANT
GLOVE SUPERSENSE BIOGEL SZ 6.5 (GLOVE) ×1
GLOVE SURG SS PI 7.5 STRL IVOR (GLOVE) ×4 IMPLANT
GOWN PREVENTION PLUS XXLARGE (GOWN DISPOSABLE) ×4 IMPLANT
GOWN STRL NON-REIN LRG LVL3 (GOWN DISPOSABLE) ×8 IMPLANT
GOWN STRL REIN XL XLG (GOWN DISPOSABLE) ×2 IMPLANT
HEMOSTAT SNOW SURGICEL 2X4 (HEMOSTASIS) IMPLANT
HEMOSTAT SURGICEL 2X14 (HEMOSTASIS) IMPLANT
KIT BASIN OR (CUSTOM PROCEDURE TRAY) ×4 IMPLANT
KIT ROOM TURNOVER OR (KITS) ×4 IMPLANT
MARKER GRAFT CORONARY BYPASS (MISCELLANEOUS) IMPLANT
MARKER SKIN DUAL TIP RULER LAB (MISCELLANEOUS) ×1 IMPLANT
NS IRRIG 1000ML POUR BTL (IV SOLUTION) ×8 IMPLANT
PACK PERIPHERAL VASCULAR (CUSTOM PROCEDURE TRAY) ×4 IMPLANT
PAD ARMBOARD 7.5X6 YLW CONV (MISCELLANEOUS) ×9 IMPLANT
PADDING CAST COTTON 6X4 STRL (CAST SUPPLIES) IMPLANT
PENCIL BUTTON HOLSTER BLD 10FT (ELECTRODE) ×1 IMPLANT
SET COLLECT BLD 21X3/4 12 (NEEDLE) IMPLANT
SLEEVE SURGEON STRL (DRAPES) ×1 IMPLANT
SPONGE GAUZE 4X4 12PLY (GAUZE/BANDAGES/DRESSINGS) ×1 IMPLANT
SPONGE LAP 18X18 X RAY DECT (DISPOSABLE) ×1 IMPLANT
STAPLER VISISTAT 35W (STAPLE) IMPLANT
STOPCOCK 4 WAY LG BORE MALE ST (IV SETS) IMPLANT
STOPCOCK MORSE 400PSI 3WAY (MISCELLANEOUS) ×1 IMPLANT
SUT ETHILON 3 0 PS 1 (SUTURE) IMPLANT
SUT PROLENE 5 0 C 1 24 (SUTURE) ×7 IMPLANT
SUT PROLENE 6 0 BV (SUTURE) ×6 IMPLANT
SUT PROLENE 6 0 C 1 24 (SUTURE) ×2 IMPLANT
SUT PROLENE 7 0 BV 1 (SUTURE) ×2 IMPLANT
SUT SILK 2 0 SH (SUTURE) ×5 IMPLANT
SUT SILK 3 0 (SUTURE) ×8
SUT SILK 3-0 18XBRD TIE 12 (SUTURE) IMPLANT
SUT VIC AB 2-0 CT1 27 (SUTURE) ×8
SUT VIC AB 2-0 CT1 TAPERPNT 27 (SUTURE) ×6 IMPLANT
SUT VIC AB 3-0 SH 27 (SUTURE) ×20
SUT VIC AB 3-0 SH 27X BRD (SUTURE) ×6 IMPLANT
SUT VICRYL 4-0 PS2 18IN ABS (SUTURE) ×10 IMPLANT
SYR TB 1ML LUER SLIP (SYRINGE) ×1 IMPLANT
TAPE UMBILICAL COTTON 1/8X30 (MISCELLANEOUS) ×1 IMPLANT
TOWEL OR 17X24 6PK STRL BLUE (TOWEL DISPOSABLE) ×8 IMPLANT
TOWEL OR 17X26 10 PK STRL BLUE (TOWEL DISPOSABLE) ×8 IMPLANT
TRAY FOLEY CATH 14FRSI W/METER (CATHETERS) ×4 IMPLANT
TUBING EXTENTION W/L.L. (IV SETS) IMPLANT
UNDERPAD 30X30 INCONTINENT (UNDERPADS AND DIAPERS) ×3 IMPLANT
WATER STERILE IRR 1000ML POUR (IV SOLUTION) ×4 IMPLANT

## 2013-01-28 NOTE — Op Note (Signed)
[facility]  Patient name: [name] MRN: [id number] DOB: 1945-12-27 Sex: male  01/28/2013 Pre-operative Diagnosis: Ischemic left fifth toe Post-operative diagnosis:  Same Surgeon:  [name] Assistants:  Della Goo Procedure:   #1: Distal left superficial femoral to posterior tibial artery with ipsilateral non-reversed translocated saphenous vein   #2: Left fifth toe amputation with metatarsal head Anesthesia:  Gen. Blood Loss:  See anesthesia record Specimens:  None  Findings:  Palpable posterior tibial pulse after bypass. Adequate bleeding following amputation. The vein was somewhat sclerotic however there was excellent flow through the vein after bypass. I elected to use the distal superficial femoral artery as the inflow, as the patient did not have enough length on the vein for the common femoral to the inflow vessel  Indications:  The patient has had ischemic changes to the left fifth toe. Angiography reveals an occluded popliteal artery with reconstitution of the posterior tibial artery. He is having ischemic changes to his fifth toe which has dry gangrene. This is very painful for him. He was previously scheduled for tibial bypass 2 weeks ago, however he developed C. difficile colitis in the operation had to be delayed.  Procedure:  The patient was identified in the holding area and taken to [facility] OR ROOM 16  The patient was then placed supine on the table. general anesthesia was administered.  The patient was prepped and draped in the usual sterile fashion.  A time out was called and antibiotics were administered.  The saphenous vein in the left leg was marked with an 8 pen. It became somewhat difficult to follow around the level of the knee. I initially began making a medial incision below the knee and the lower leg. Cautery was used to divide the subcutaneous tissue. The fascia was divided. I then exposed the posterior tibial artery. This was  a calcified artery but had monophasic Doppler flow within this. It did appear adequate to serve as a bypass target. Next the saphenous vein was harvested. This was done through multiple skip incisions. Side branches were ligated between silk ties and metal clips. The vein was dissected out to the saphenofemoral junction. The vein only appeared to be adequate down to below the knee. I did not feel there was enough length on the vein to come from the common femoral artery, and therefore I felt I needed to use the distal superficial femoral artery as the inflow vessel. A medial incision which had been used for the vein harvest was expanded and the popliteal artery was exposed. I divided the adductor canal as there was significant calcification in the region of the adductor canal. I was able to identify a soft spot in the distal superficial femoral artery just proximal to the adductor canal. Next the vein was ligated at the saphenofemoral junction with a 5-0 Prolene. It was also ligated distally. The vein was then prepared back table. Actually in the distal portion of the vein there was occlusion of the vein. Therefore I had to shorten the vein even more in order to get a vein which had a lumen and it. The vein was distended. It distended somewhat it appeared to be of adequate diameter but still was a little sclerotic. It was marked with an ink pen for orientation. I then created a tunnel between the posterior tibial artery exposure and the medial above-knee incision. This was done in a subfascial manner. The patient was fully heparinized. After the heparin circulated,  a web roll was placed on the upper thigh as was the tourniquet. The leg was exsanguinated with an Esmarch. The tourniquet was taken to 300 mm of pressure. The superficial femoral artery was opened with a #11 blade and extended longitudinally with Potts scissors. The vein was placed in a non-reversed fashion. It was spatulated to fit the size of the  arteriotomy. A running anastomosis with 5-0 Prolene was created. Once the anastomosis was completed, the tourniquet was let down. There was good hemostasis at the anastomosis. I used a valvulotome to lyse the valves. This was done on 2 occasions. There was excellent pulsatile flow through the vein graft after lysing the valves. The vein graft was then occluded proximally. It was brought through the previously created tunnel making sure to maintain proper orientation. Next the leg was exsanguinated again with the Esmarch and the tourniquet was reinflated. The leg was straightened to determine the location of the arteriotomy as there was barely enough vein for the bypass. I made a arteriotomy with 11 blade which was extended longitudinally with Potts scissors. The vein was then spatulated the leg straightened to make sure that the vein was adequate in length. A running end-to-side anastomosis was created with 6-0 Prolene. Prior to completion the tourniquet was let down and the appropriate flushing maneuvers were performed. Adequate hemostasis was achieved. There was excellent Doppler dependent blood flow within the posterior tibial artery. This was checked both within the wound and down behind the ankle. Because of the patient's recent creatinine issues I elected not to shoot an arteriogram. 50 mg of protamine were administered. The vein harvest incision were closed with a layer of 3-0 Vicryl and a layer of 4-0 Vicryl. The above-knee arterial incision was closed by reapproximating the fascia with 2-0 Vicryl and then closing the subcutaneous tissue with 3-0 Vicryl and the skin with 4-0 Vicryl. The posterior tibial artery incision was closed reapproximating the distal portion of the fascia with a 2-0 Vicryl. I left the fascia opened in the proximal portion of the incision as I felt that it would be likely compromising the bypass if I closed the fascia. I then reapproximated the subcutaneous tissue with 3-0 Vicryl and  the skin was closed with 4-0 Vicryl. Dermabond placed on all wounds. The patient had a palpable posterior tibial pulse.  Next, attention was turned towards the left fifth toe. This had dry gangrene associated with it. I made a tennis racquet type incision around the base of the toe. This was taken down to the bone. Bone cutter was used to transect the bone. It was very soft at this level. I used a Ron jeweler's to get back to healthy bone. This required taking the metatarsal head. Ultimately I got back to healthy bone. The tissue in this area appeared well perfused. I elected to close the skin with 3 interrupted nylon sutures. Patient tolerated the procedure well there no complications   Disposition:  To PACU in stable condition.   Juleen China, M.D. [facility] Office: [phone] Pager:  [phone]

## 2013-01-28 NOTE — Progress Notes (Signed)
[facility]  Patient Status: Active (receiving services up to time of hospitalization)  [facility] is providing the following services: RN, PT and HHA  If patient discharges after hours, please call [phone].   Jodene Nam 01/28/2013, 5:38 PM

## 2013-01-28 NOTE — Transfer of Care (Signed)
Immediate Anesthesia Transfer of Care Note  Patient: [name]  Procedure(s) Performed: Procedure(s) with comments: BYPASS GRAFT FEMORAL-TIBIAL ARTERY (Left) - Ultrasound Guided AMPUTATION LEFT 5TH TOE (Left) VEIN HARVEST (Left)  Patient Location: PACU  Anesthesia Type:General  Level of Consciousness: oriented, sedated, patient cooperative and responds to stimulation  Airway & Oxygen Therapy: Patient Spontanous Breathing and Patient connected to face mask oxygen  Post-op Assessment: Report given to PACU RN, Post -op Vital signs reviewed and stable, Patient moving all extremities and Patient moving all extremities X 4  Post vital signs: Reviewed and stable  Complications: No apparent anesthesia complications

## 2013-01-28 NOTE — Anesthesia Postprocedure Evaluation (Signed)
  Anesthesia Post-op Note  Patient: [name]  Procedure(s) Performed: Procedure(s) with comments: BYPASS GRAFT FEMORAL-TIBIAL ARTERY (Left) - Ultrasound Guided AMPUTATION LEFT 5TH TOE (Left) VEIN HARVEST (Left)  Patient Location: PACU  Anesthesia Type:General  Level of Consciousness: awake and alert   Airway and Oxygen Therapy: Patient Spontanous Breathing  Post-op Pain: mild  Post-op Assessment: Post-op Vital signs reviewed  Post-op Vital Signs: stable  Complications: No apparent anesthesia complications

## 2013-01-28 NOTE — Preoperative (Signed)
Beta Blockers   Reason not to administer Beta Blockers:Not Applicable 

## 2013-01-28 NOTE — Progress Notes (Addendum)
Pt is crying and moaning. He is complaining of aching/burning pain 10/10 in his L foot. Peroneal pulse is still dopplerable and surgical incisions are unremarkable. He states the pain medicine he is receiving is not working. Dr. Durwin Nora notified. New orders received for a Full dose dilaudid PCA. Will initiate PCA and continue to monitor.

## 2013-01-28 NOTE — Progress Notes (Signed)
Dr. Durwin Nora notified about K+ being 5.7 (down from 6.0) no new orders received. BMP and CBC scheduled to be drawn in AM. Will continue to monitor.

## 2013-01-28 NOTE — H&P (Signed)
[facility]   Patient name: [name] MRN: [id number] DOB: 04/28/1946 Sex: male    No chief complaint on file.   HISTORY OF PRESENT ILLNESS: left great toe which was relieved with "gout medication". Pt states his toes became purple and mottled and he had blister on left small toe which is very painful. He had left iliac stents placed by myself in 2009 for claudication and has done well until the ankle fracture. He denies night or rest pain except in small toe on left He also has a small healing ulcer on lateral malleolus. He has developed ischemic changes to his left fifth toe. He underwent angiogram which revealed an occluded popliteal and tibial vessels with reconstitution of the posterior tibial artery in the midcalf. He has also undergone vein mapping which hasn't have an adequate vein down to the knee. He was originally scheduled for posterior tibial artery bypass graft 2 weeks ago, however he developed C. Difficile colitis and was admitted to the hospital. His operation was canceled. He's back today to have this procedure performed. He continues to complain of rest pain in his left fifth toe.   Past Medical History  Diagnosis Date  . 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 Hyperlipidemia   . Hypertension   . PVD (peripheral vascular disease)     Left iliac PCI/stent  . Cerebrovascular disease   . Tobacco abuse, in remission     Remote  . GERD (gastroesophageal reflux disease)   . Hypothyroid   . Allergic rhinitis   . Chronic lung disease     ?  Rheumatoid lung; ?  Adverse reaction to methotrexate  . Schatzki's ring   . Hx of Clostridium difficile infection   . Achalasia   . Hiatal hernia   . Coronary artery disease   . Heart murmur   . Shortness of breath     occ  . Diabetes mellitus     borderline no meds  . Stones in the urinary tract   . Atrial fibrillation   . Chronic back pain   . Rheumatoid arthritis      with nephritis  . Cervical spondylosis   . DDD (degenerative disc disease), lumbar   . History of kidney stones 2010    s/p lithotripsy  . Chronic kidney disease     pt denies chronic kidney disease  . Soft enlarged prostate   . Pneumonia   . Anginal pain     none as of 01/19/13    Past Surgical History  Procedure Laterality Date  . Coronary artery bypass graft      x6 In 1998  . Total knee arthroplasty      Right  . Ankle fusion      Left  . Wrist fusion      Right  . Shoulder surgery    . Colonoscopy  09/12/2011    Dr. Elly Modena hemorrhoids, normal colon and distal terminal ileum. Random bx negative. Stool for CDiff positive.  . Esophagogastroduodenoscopy  09/13/2011    Dr. Lyndle Herrlich plawues mid-esophagus KOH negative. Distal esophageal ring and ulcer. Mild gastritis. duodenal diverticulum. Savaory dilation 16mm.   Gaspar Bidding dilation  09/13/2011  . Esophageal manometry  09/30/2011    Procedure: ESOPHAGEAL MANOMETRY (EM);  Surgeon: Rob Bunting, MD;  Location: [facility] ENDOSCOPY;  Service: Endoscopy;  Laterality: N/A;  . Esophagogastroduodenoscopy  09/12/2011    Dr. Rinaldo Ratel rings s/p dilation  . Cardiac surgery    .  Esophagomyotomy  11/12/11    [facility]- with DOR antireflux surgery  . Esophagogastroduodenoscopy  06/25/2012    Procedure: ESOPHAGOGASTRODUODENOSCOPY (EGD);  Surgeon: Corbin Ade, MD;  Location: [facility]O SUITE;  Service: Endoscopy;  Laterality: N/A;  7:30  . Savory dilation  06/25/2012    Procedure: SAVORY DILATION;  Surgeon: Corbin Ade, MD;  Location: [facility] SUITE;  Service: Endoscopy;  Laterality: N/A;  Elease Hashimoto dilation  06/25/2012    Procedure: Elease Hashimoto DILATION;  Surgeon: Corbin Ade, MD;  Location: [facility] SU[facility];  Service: Endoscopy;  Laterality: N/A;  . Ankle fusion  09/01/2012    Procedure: ANKLE FUSION;  Surgeon: Valeria Batman, MD;  Location: [facility] OR;  Service: Orthopedics;  Laterality: Left;  Take down of angulated tibial/fibula  Fractures    History   Social History  . Marital Status: Married    Spouse Name: N/A    Number of Children: 1  . Years of Education: N/A   Occupational History  . retired [employer]    Social History Main Topics  . Smoking status: Former Smoker -- 0.50 packs/day for 5 years    Types: Cigarettes    Quit date: 09/23/1965  . Smokeless tobacco: Not on file  . Alcohol Use: No  . Drug Use: No  . Sexually Active: Not on file   Other Topics Concern  . Not on file   Social History Narrative  . No narrative on file    Family History  Problem Relation Age of Onset  . Stomach cancer Mother 44  . Cancer Mother   . Heart attack Father   . Heart disease Father   . Heart disease Brother   . Leukemia Brother   . Lung disease Son     infant  . Lung disease Daughter     infant    Allergies as of 01/20/2013 - Review Complete 01/19/2013  Allergen Reaction Noted  . Amoxicillin Hives 08/24/2011  . Doxycycline Nausea And Vomiting 06/05/2011  . Levofloxacin Nausea And Vomiting 03/07/2008    No current facility-administered medications on file prior to encounter.   Current Outpatient Prescriptions on File Prior to Encounter  Medication Sig Dispense Refill  . acetaminophen (TYLENOL) 500 MG tablet Take 500 mg by mouth 2 (two) times daily.       Marland Kitchen aspirin EC 81 MG tablet Take 81 mg by mouth daily.        Marland Kitchen atenolol (TENORMIN) 25 MG tablet Take 25 mg by mouth daily.      . cholecalciferol (VITAMIN D) 400 UNITS TABS Take 400 Units by mouth daily.       . folic acid (FOLVITE) 1 MG tablet Take 1 mg by mouth daily.        Marland Kitchen ibuprofen (ADVIL,MOTRIN) 800 MG tablet Take 800 mg by mouth 2 (two) times daily.       Marland Kitchen levothyroxine (SYNTHROID, LEVOTHROID) 100 MCG tablet Take 1 tablet (100 mcg total) by mouth daily.  90 tablet  5  . lisinopril (PRINIVIL,ZESTRIL) 20 MG tablet Take 20 mg by mouth daily.        . Omega-3 Fatty Acids (FISH OIL) 500 MG CAPS Take 1 capsule by mouth daily.       . pravastatin (PRAVACHOL) 80 MG tablet Take 80 mg by mouth at bedtime.       . predniSONE (DELTASONE) 10 MG tablet Take 1 tablet (10 mg total) by mouth daily.  30 tablet  0  . Teriparatide, Recombinant, (  FORTEO) 600 MCG/2.4ML SOLN Inject 2.4 mLs into the skin daily.       Marland Kitchen trolamine salicylate (ASPERCREME) 10 % cream Apply 1 application topically at bedtime as needed (for rheumatoid arthritis).      . Vitamin D, Ergocalciferol, (DRISDOL) 50000 UNITS CAPS Take 50,000 Units by mouth every 7 (seven) days. On Mondays         REVIEW OF SYSTEMS: No chest pain or shortness of breath. Please see history of present illness. Otherwise all negative  PHYSICAL EXAMINATION:   Vital signs are BP 126/63  Pulse 61  Temp(Src) 98 F (36.7 C) (Oral)  Resp 18  SpO2 99% General: The patient appears their stated age. HEENT:  No gross abnormalities Pulmonary:  Non labored breathing  Musculoskeletal: There are no major deformities. Neurologic: No focal weakness or paresthesias are detected, Skin: dry gangrene to the left fifth toe. Hyperemic left foot. Psychiatric: The patient has normal affect. Cardiovascular: There is a regular rate and rhythm without significant murmur appreciated.    Assessment: Ischemic left foot Plan: The patient is scheduled for a left common femoral versus superficial femoral artery to posterior tibial artery bypass graft with ipsilateral greater saphenous vein. The risks and benefits of the procedure were discussed with the patient including the risk of graft patency, the need for more proximal amputation. He understands these risks and wishes to proceed. I'll also removed the left fifth toe. This may require dressing changes for secondary wound healing.  [name], M.D. [facility] [place] Office: [phone] Pager:  [phone]

## 2013-01-28 NOTE — Anesthesia Procedure Notes (Signed)
Procedure Name: Intubation Date/Time: 01/28/2013 10:08 AM Performed by: Jerilee Hoh Pre-anesthesia Checklist: Patient identified, Emergency Drugs available, Suction available and Patient being monitored Patient Re-evaluated:Patient Re-evaluated prior to inductionOxygen Delivery Method: Circle system utilized Preoxygenation: Pre-oxygenation with 100% oxygen Intubation Type: IV induction Ventilation: Mask ventilation without difficulty Laryngoscope Size: Mac and 4 Grade View: Grade I Tube type: Oral Tube size: 7.5 mm Number of attempts: 1 Airway Equipment and Method: Stylet Placement Confirmation: ETT inserted through vocal cords under direct vision,  positive ETCO2 and breath sounds checked- equal and bilateral Secured at: 22 cm Tube secured with: Tape Dental Injury: Teeth and Oropharynx as per pre-operative assessment

## 2013-01-28 NOTE — Anesthesia Preprocedure Evaluation (Signed)
Anesthesia Evaluation  Patient identified by MRN, date of birth, ID band Patient awake    Reviewed: Allergy & Precautions, H&P , NPO status , Patient's Chart, lab work & pertinent test results  History of Anesthesia Complications Negative for: history of anesthetic complications  Airway Mallampati: I  Neck ROM: Full    Dental   Pulmonary shortness of breath, former smoker,  breath sounds clear to auscultation        Cardiovascular hypertension, + angina + CAD and + Peripheral Vascular Disease + dysrhythmias Rhythm:Regular Rate:Normal     Neuro/Psych  Neuromuscular disease    GI/Hepatic GERD-  ,achalasia   Endo/Other  diabetesHypothyroidism   Renal/GU      Musculoskeletal  (+) Arthritis -, Rheumatoid disorders,    Abdominal   Peds  Hematology   Anesthesia Other Findings   Reproductive/Obstetrics                           Anesthesia Physical Anesthesia Plan  ASA: III  Anesthesia Plan: General   Post-op Pain Management:    Induction: Intravenous  Airway Management Planned: Oral ETT  Additional Equipment:   Intra-op Plan:   Post-operative Plan: Extubation in OR  Informed Consent: I have reviewed the patients History and Physical, chart, labs and discussed the procedure including the risks, benefits and alternatives for the proposed anesthesia with the patient or authorized representative who has indicated his/her understanding and acceptance.   Dental advisory given  Plan Discussed with: CRNA and Surgeon  Anesthesia Plan Comments:         Anesthesia Quick Evaluation

## 2013-01-29 ENCOUNTER — Encounter (HOSPITAL_COMMUNITY): Payer: Self-pay | Admitting: Surgery

## 2013-01-29 LAB — BASIC METABOLIC PANEL
BUN: 23 mg/dL (ref 6–23)
CO2: 22 mEq/L (ref 19–32)
Chloride: 108 mEq/L (ref 96–112)
Creatinine, Ser: 1.14 mg/dL (ref 0.50–1.35)
Glucose, Bld: 82 mg/dL (ref 70–99)
Potassium: 4.4 mEq/L (ref 3.5–5.1)

## 2013-01-29 LAB — CBC
HCT: 29.7 % — ABNORMAL LOW (ref 39.0–52.0)
Hemoglobin: 9.9 g/dL — ABNORMAL LOW (ref 13.0–17.0)
MCV: 89.5 fL (ref 78.0–100.0)
WBC: 16.3 10*3/uL — ABNORMAL HIGH (ref 4.0–10.5)

## 2013-01-29 MED ORDER — OXYCODONE-ACETAMINOPHEN 5-325 MG PO TABS
1.0000 | ORAL_TABLET | ORAL | Status: DC | PRN
  Administered 2013-01-29 (×2): 2 via ORAL
  Administered 2013-01-30: 1 via ORAL
  Administered 2013-01-30: 2 via ORAL
  Administered 2013-01-30: 1 via ORAL
  Administered 2013-01-30 – 2013-01-31 (×3): 2 via ORAL
  Filled 2013-01-29 (×2): qty 2
  Filled 2013-01-29: qty 1
  Filled 2013-01-29 (×2): qty 2
  Filled 2013-01-29: qty 1
  Filled 2013-01-29 (×2): qty 2

## 2013-01-29 MED ORDER — TERIPARATIDE (RECOMBINANT) 600 MCG/2.4ML ~~LOC~~ SOLN
20.0000 ug | Freq: Every day | SUBCUTANEOUS | Status: DC
  Administered 2013-01-29 – 2013-01-30 (×2): 20 ug via SUBCUTANEOUS

## 2013-01-29 MED ORDER — KETOROLAC TROMETHAMINE 30 MG/ML IJ SOLN
30.0000 mg | Freq: Four times a day (QID) | INTRAMUSCULAR | Status: DC | PRN
  Administered 2013-01-29 (×2): 30 mg via INTRAVENOUS
  Filled 2013-01-29 (×2): qty 1

## 2013-01-29 MED ORDER — DIAZEPAM 5 MG PO TABS
5.0000 mg | ORAL_TABLET | Freq: Every evening | ORAL | Status: DC | PRN
  Administered 2013-01-30 – 2013-01-31 (×2): 5 mg via ORAL
  Filled 2013-01-29 (×2): qty 1

## 2013-01-29 NOTE — Progress Notes (Signed)
I agree with the above Palpable PT Incisions clean Still with significant pain. Will add toradol.  Check Cr in am HyperKalemia - improved this am.  Treated with glc, insulin in OR OOB today D/c foley Keep in 3300 today  Wells Fabiano Ginley

## 2013-01-29 NOTE — Progress Notes (Signed)
UR complete.  Jovonte Commins RN, MSN 

## 2013-01-29 NOTE — Evaluation (Signed)
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 POD #1 able to tranfer to bathroom and brief ambulation. O tto follow acutely. Recommend HHOt for d/c planning     OT Assessment  Patient needs continued OT Services    Follow Up Recommendations  Home health OT;Supervision/Assistance - 24 hour    Barriers to Discharge      Equipment Recommendations       Recommendations for Other Services    Frequency  Min 2X/week    Precautions / Restrictions Precautions Precautions: Fall Restrictions Weight Bearing Restrictions: No   Pertinent Vitals/Pain See vitals    ADL  Eating/Feeding: Set up Where Assessed - Eating/Feeding: Chair Grooming: Wash/dry face;Wash/dry hands;Set up Where Assessed - Grooming: Supported sitting Lower Body Dressing: Maximal assistance Where Assessed - Lower Body Dressing: Unsupported sitting Toilet Transfer: Moderate assistance Toilet Transfer Method: Sit to Barista: Bedside commode Toileting - Clothing Manipulation and Hygiene: Minimal assistance Where Assessed - Toileting Clothing Manipulation and Hygiene: Sit on 3-in-1 or toilet Equipment Used: Rolling walker;Gait belt Transfers/Ambulation Related to ADLs: Pt provided bil plateform attachments for RW. Pt progressing well using bil elbow weight bearing .  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 PT's family to bring in home RW. Pt with burning in left foot with static standing weight bearing. pt tolerating session well and  motivated. Pt completed chair to 3n1 transfer, short ambulation and return to chairl    OT Diagnosis: Generalized weakness;Acute pain  OT Problem List: Decreased strength;Decreased activity tolerance;Impaired balance (sitting and/or standing);Decreased safety awareness;Decreased knowledge of use of DME or AE;Decreased knowledge of precautions;Pain OT Treatment Interventions: Self-care/ADL training;DME and/or AE instruction;Therapeutic activities;Patient/family education;Balance training;Therapeutic exercise   OT Goals Acute Rehab OT Goals OT Goal Formulation: With patient Time For Goal Achievement: 02/12/13 Potential to Achieve Goals: Good ADL Goals Pt Will Perform Grooming: with modified independence;Standing at sink ADL Goal: Grooming - Progress: Goal set today Pt Will Perform Lower Body Dressing: with modified independence;Sit to stand from chair ADL Goal: Lower Body Dressing - Progress: Goal set today Pt Will Transfer to Toilet: with modified independence;Ambulation;3-in-1 ADL Goal: Toilet Transfer - Progress: Goal set today Miscellaneous OT Goals Miscellaneous OT Goal #1: Pt will complete bed mobility MOD I as precursor to adls OT Goal: Miscellaneous Goal #1 - Progress: Goal set today  Visit Information  Last OT Received On: 01/29/13 Assistance Needed: +1 PT/OT Co-Evaluation/Treatment: Yes    Subjective Data  Subjective: "I use arm rest on my walker' Patient Stated Goal: to return home Monday   Prior Functioning     Home Living Lives With: Spouse Available Help at Discharge: Family;Available 24 hours/day Type of Home: House Home Access: Ramped entrance Home Layout: Laundry or work area in basement [employer] Shower/Tub: Other (comment) (takes a sponge bath) Bathroom Toilet: Standard Bathroom Accessibility: No (not via w/c) Home Adaptive Equipment: Walker - rolling;Straight cane;Wheelchair - powered;Bedside commode/3-in-1 (powered lift chair) Additional Comments: sleeps  in the recliner, used a RW PTA, had [facility] active HHPT, RN and aide.   Prior Function Level of Independence: Independent with assistive device(s)  Driving: Yes Vocation: Retired Comments: [place] [employer]. Communication Communication: No difficulties Dominant Hand: Right         Vision/Perception Vision - History Baseline Vision: No visual deficits   Cognition  Cognition Arousal/Alertness: Awake/alert Behavior During Therapy: WFL for tasks assessed/performed Overall Cognitive Status: Within Functional Limits for tasks assessed    Extremity/Trunk Assessment Right Upper Extremity Assessment RUE ROM/Strength/Tone: Deficits RUE ROM/Strength/Tone Deficits: severe limitation of function due to RA.. .elbow extension about -30 degrees with almost no active function of shoulder due to rotator cuff degeneration, ..unable to fully flex fingers Left Upper Extremity Assessment LUE ROM/Strength/Tone: Deficits LUE ROM/Strength/Tone Deficits: shoulder is slightly more functional than RUE with -30 degrees of elbow extension, no ROM of the wrist .   Trunk Assessment Trunk Assessment: Kyphotic     Mobility Bed Mobility Bed Mobility: Not assessed Transfers Sit to Stand: 3: Mod assist Stand to Sit: 4: Min assist Details for Transfer Assistance: mod assist from lower surfaces due to decreased ability to extend wrist and push up through hands.  Verbal cues for hand placement.  Pt reports using a lift chair at home     Exercise   Balance Static Standing Balance Static Standing - Balance Support: Right upper extremity supported;Left upper extremity supported;Bilateral upper extremity supported;During functional activity Static Standing - Level of Assistance: 5: Stand by assistance Static Standing - Comment/# of Minutes: standing for adjustments to plateform attachments   End of Session OT - End of Session Activity Tolerance: Patient tolerated treatment well Patient left: in chair;with call  bell/phone within reach;with family/visitor present Nurse Communication: Mobility status;Precautions  GO     Lucile Shutters 01/29/2013, 10:08 AM Pager: [phone]

## 2013-01-29 NOTE — Progress Notes (Signed)
[facility] Progress Note  01/29/2013 7:23 AM 1 Day Post-Op  Subjective:  Lots of pain through the night  Afebrile  Systolic BP in 80's this am  Filed Vitals:   01/29/13 0336  BP:   Pulse:   Temp:   Resp: 20    Physical Exam: Incisions:  All c/d/i Extremities:  Left foot wrapped; palpable PT left  CBC    Component Value Date/Time   WBC 16.3* 01/29/2013 0430   RBC 3.32* 01/29/2013 0430   HGB 9.9* 01/29/2013 0430   HCT 29.7* 01/29/2013 0430   PLT 223 01/29/2013 0430   MCV 89.5 01/29/2013 0430   MCH 29.8 01/29/2013 0430   MCHC 33.3 01/29/2013 0430   RDW 16.5* 01/29/2013 0430   LYMPHSABS 1.6 01/10/2013 0910   MONOABS 1.5* 01/10/2013 0910   EOSABS 0.6 01/10/2013 0910   BASOSABS 0.1 01/10/2013 0910    BMET    Component Value Date/Time   NA 140 01/29/2013 0430   K 4.4 01/29/2013 0430   CL 108 01/29/2013 0430   CO2 22 01/29/2013 0430   GLUCOSE 82 01/29/2013 0430   BUN 23 01/29/2013 0430   CREATININE 1.14 01/29/2013 0430   CALCIUM 8.4 01/29/2013 0430   GFRNONAA 65* 01/29/2013 0430   GFRAA 75* 01/29/2013 0430    INR    Component Value Date/Time   INR 1.01 01/22/2013 1456     Intake/Output Summary (Last 24 hours) at 01/29/13 0723 Last data filed at 01/29/13 0600  Gross per 24 hour  Intake   4020 ml  Output   3900 ml  Net    120 ml     Assessment/Plan:  67 y.o. 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  Will start Toradol today to help with pain.  Will continue to monitor Cr and check labs in am.  Dr. Myra Gianotti wants PCA discontinued as soon as pt can tolerate. -pressure dropped with PCA-receiving 500 cc fluid bolus this am. -DVT prophylaxis: lovenox  -keep in stepdown today -mobilize-PT to work with pt today -WBC improved from admission   Doreatha Massed,  PA-C [facility] [phone] 01/29/2013 7:23 AM

## 2013-01-29 NOTE — Evaluation (Signed)
Physical Therapy Evaluation Patient Details Name: [name] MRN: [id number] DOB: 05/11/1946 Today's Date: 01/29/2013 Time: 4098-1191 PT Time Calculation (min): 43 min  PT Assessment / Plan / Recommendation Clinical Impression  67 y.o. 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  He presents today POD #1 with increased left foot pain, antalgic gait pattern and generally slower moving than PTA.  He has good family and friend support and should be able to go home at discharge with resumation of [facility] services in place PTA.  HHPT, RN, aide.      PT Assessment  Patient needs continued PT services    Follow Up Recommendations  Home health PT;Supervision/Assistance - 24 hour    Does the patient have the potential to tolerate intense rehabilitation     NA  Barriers to Discharge None None    Equipment Recommendations  None recommended by PT    Recommendations for Other Services   none  Frequency Min 4X/week    Precautions / Restrictions Precautions Precautions: Fall Restrictions Weight Bearing Restrictions: No   Pertinent Vitals/Pain See vitals flow sheet.      Mobility  Transfers Transfers: Sit to Stand;Stand to Sit Sit to Stand: 3: Mod assist Stand to Sit: 4: Min assist Details for Transfer Assistance: mod assist from lower surfaces due to decreased ability to extend wrist and push up through hands.  Verbal cues for hand placement.  Pt reports using a lift chair at home Ambulation/Gait Ambulation/Gait Assistance: 4: Min assist Ambulation Distance (Feet): 85 Feet Assistive device: Bilateral platform walker Ambulation/Gait Assistance Details: min assist to steady pt for balance.  Verbal cues to stay closer/inside of RW.   Gait Pattern: Step-through pattern;Shuffle;Trunk flexed Gait velocity: less than 1.8  ft/sec which inidcates risk for recurrent falls    Exercises General Exercises - Lower Extremity Ankle Circles/Pumps: AROM;Both;10 reps;Seated   PT Diagnosis: Difficulty walking;Abnormality of gait;Acute pain;Generalized weakness  PT Problem List: Decreased strength;Decreased activity tolerance;Decreased mobility;Decreased balance;Pain PT Treatment Interventions: DME instruction;Gait training;Functional mobility training;Therapeutic activities;Therapeutic exercise;Balance training;Neuromuscular re-education;Patient/family education   PT Goals Acute Rehab PT Goals PT Goal Formulation: With patient/family Time For Goal Achievement: 02/12/13 Potential to Achieve Goals: Good Pt will go Supine/Side to Sit: with modified independence;with HOB 0 degrees PT Goal: Supine/Side to Sit - Progress: Goal set today Pt will go Sit to Supine/Side: with modified independence;with HOB 0 degrees PT Goal: Sit to Supine/Side - Progress: Goal set today Pt will go Sit to Stand: with supervision;with upper extremity assist PT Goal: Sit to Stand - Progress: Goal set today Pt will go Stand to Sit: with supervision;with upper extremity assist PT Goal: Stand to Sit - Progress: Goal set today Pt will Transfer Bed to Chair/Chair to Bed: with supervision PT Transfer Goal: Bed to Chair/Chair to Bed - Progress: Goal set today Pt will Ambulate: 51 - 150 feet;with supervision;with rolling walker;Other (comment) (bil platform RW) PT Goal: Ambulate - Progress: Goal set today  Visit Information  Last PT Received On: 01/29/13 Assistance Needed: +1    Subjective Data  Subjective: Pt reports bad arthritis throughout his body, but hands and wrists most limiting to using RW.  Uses bil platforms at home Patient Stated Goal: to go home   Prior Functioning  Home Living Lives With: Spouse Available Help at Discharge: Family;Available 24 hours/day Type of Home: House Home Access: Ramped  entrance Home Layout: Laundry or work  area in basement [employer] Shower/Tub: Other (comment) (takes a sponge bath) Bathroom Toilet: Standard Bathroom Accessibility: No (not via w/c) Home Adaptive Equipment: Walker - rolling;Straight cane;Wheelchair - powered;Bedside commode/3-in-1 (powered lift chair) Additional Comments: sleeps in the recliner, used a RW PTA, had [facility] active HHPT, RN and aide.   Prior Function Level of Independence: Independent with assistive device(s) Driving: Yes Vocation: Retired Comments: [place] [employer]. Communication Communication: No difficulties    Cognition  Cognition Arousal/Alertness: Awake/alert Behavior During Therapy: WFL for tasks assessed/performed Overall Cognitive Status: Within Functional Limits for tasks assessed    Extremity/Trunk Assessment Right Lower Extremity Assessment RLE ROM/Strength/Tone: Deficits RLE ROM/Strength/Tone Deficits: functional strength at least 3/5 per gross assessment.   Left Lower Extremity Assessment LLE ROM/Strength/Tone: Deficits;Due to pain LLE ROM/Strength/Tone Deficits: deficits due to pain and post-op status.  Pt with PMH of L ankle "rod" premorbid limited ankle PF/DF on this side.  Pt able to lift leg against gravity.   Trunk Assessment Trunk Assessment: Kyphotic   Balance Static Standing Balance Static Standing - Balance Support: Right upper extremity supported;Left upper extremity supported;Bilateral upper extremity supported;During functional activity Static Standing - Level of Assistance: 5: Stand by assistance Static Standing - Comment/# of Minutes: supervision for safety while making adjustments to his bil platform RW.    End of Session PT - End of Session Equipment Utilized During Treatment: Gait belt Activity Tolerance: Patient limited by fatigue;Patient limited by pain Patient left: in chair;with call bell/phone within reach;with family/visitor present (wife and son in room at end of session) Nurse Communication: Mobility status     Lurena Joiner B. Tally Mattox, PT, DPT [phone]   01/29/2013, 10:04 AM

## 2013-01-29 NOTE — Progress Notes (Signed)
Pt. Doing well with weaning PCA; has not used since earlier this am.  Percocet and Toradol have been given during the shift and pain level is currently a 2.  Vivi Martens RN

## 2013-01-29 NOTE — Progress Notes (Signed)
Nutrition Brief Note  Patient identified on the Malnutrition Screening Tool (MST) Report for unsure of any weight loss. From review of usual weights in EMR, patient has not lost a significant amount of weight in the past month. Oral intake has been good.  Wt Readings from Last 5 Encounters:  01/28/13 148 lb 2.4 oz (67.2 kg)  01/28/13 148 lb 2.4 oz (67.2 kg)  01/22/13 143 lb (64.864 kg)  01/19/13 142 lb 6.4 oz (64.592 kg)  01/10/13 147 lb 11.3 oz (67 kg)    Body mass index is 22.53 kg/(m^2). Patient meets criteria for normal weight based on current BMI.   Current diet order is Heart Healthy, intake is good. Labs and medications reviewed.   No nutrition interventions warranted at this time. If nutrition issues arise, please consult RD.   Joaquin Courts, RD, LDN, CNSC Pager [phone] After Hours Pager [phone]

## 2013-01-30 LAB — BASIC METABOLIC PANEL
CO2: 21 mEq/L (ref 19–32)
Calcium: 8.4 mg/dL (ref 8.4–10.5)
Chloride: 107 mEq/L (ref 96–112)
Creatinine, Ser: 1.18 mg/dL (ref 0.50–1.35)
Glucose, Bld: 90 mg/dL (ref 70–99)

## 2013-01-30 MED ORDER — SODIUM CHLORIDE 0.9 % IJ SOLN
3.0000 mL | Freq: Two times a day (BID) | INTRAMUSCULAR | Status: DC
  Administered 2013-01-30: 3 mL via INTRAVENOUS

## 2013-01-30 NOTE — Progress Notes (Addendum)
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 male who is doing well. Pain much better controlled with toradol. Not using PCA  VASC. LAB Studies:        ABI: Pend   Imaging: No results found.  Significant Diagnostic Studies: CBC Lab Results  Component Value Date   WBC 16.3* 01/29/2013   HGB 9.9* 01/29/2013   HCT 29.7* 01/29/2013   MCV 89.5 01/29/2013   PLT 223 01/29/2013    BMET     Component Value Date/Time   NA 137 01/30/2013 0344   K 4.1 01/30/2013 0344   CL 107 01/30/2013 0344   CO2 21 01/30/2013 0344   GLUCOSE 90 01/30/2013 0344   BUN 22 01/30/2013 0344   CREATININE 1.18 01/30/2013 0344   CALCIUM 8.4 01/30/2013 0344   GFRNONAA 62* 01/30/2013 0344   GFRAA 72* 01/30/2013 0344    COAG Lab Results  Component Value Date   INR 1.01 01/22/2013   INR 0.91 08/28/2012   INR 1.0 11/11/2007   No results found for this basename: PTT    Physical Examination  BP Readings from Last 3 Encounters:  01/30/13 142/55  01/30/13 142/55  01/22/13 101/63   Temp Readings from Last 3 Encounters:  01/30/13 98.6 F (37 C) Oral  01/30/13 98.6 F (37 C) Oral  01/22/13 97.9 F (36.6 C)    SpO2 Readings from Last 3 Encounters:  01/30/13 99%  01/30/13 99%  01/22/13 98%   Pulse Readings from Last 3 Encounters:  01/30/13 70  01/30/13 70  01/22/13 62    Pt is A&O x 3 left lower extremity: Incision/s is/are clean,dry.intact, and  healing without hematoma, erythema or drainage Limb is warm; with good color Amp site looks healthy Left Posterior tibial pulse is  palpable  Assessment/Plan: Pt. Doing well Post-op pain is controlled Wounds are  healing well PT/OT for ambulation Continue wound care as ordered Transfer to floor  ROCZNIAK,REGINA J  01/30/2013 8:32 AM  Addendum  I have independently interviewed and examined the patient, and I agree with the physician assistant's findings.  Incisions c/d/i, some old clot at left ray amputation incision, incision line intact.  Check CBC tomorrow. Creatinine ok despite toradol.  Convert pt's meds to PO with IV breakthrough.  Leonides Sake, MD [facility] [place] Office: [phone] Pager: [phone]  01/30/2013, 9:05 AM

## 2013-01-30 NOTE — Progress Notes (Signed)
Report called to Fayrene Fearing, receiving RN on 2000.  Pt transferred to 2003 via wheelchair with all belongings. Pt's wife and family accompanied pt to new room.   Roselie Awkward, RN

## 2013-01-31 LAB — BASIC METABOLIC PANEL
BUN: 14 mg/dL (ref 6–23)
CO2: 23 mEq/L (ref 19–32)
Calcium: 8.7 mg/dL (ref 8.4–10.5)
GFR calc non Af Amer: 71 mL/min — ABNORMAL LOW (ref >90)
Glucose, Bld: 66 mg/dL — ABNORMAL LOW (ref 70–99)

## 2013-01-31 LAB — CBC
Hemoglobin: 8.5 g/dL — ABNORMAL LOW (ref 13.0–17.0)
MCH: 29.9 pg (ref 26.0–34.0)
MCHC: 33.3 g/dL (ref 30.0–36.0)
MCV: 89.8 fL (ref 78.0–100.0)
Platelets: 182 10*3/uL (ref 150–400)
RBC: 2.84 MIL/uL — ABNORMAL LOW (ref 4.22–5.81)

## 2013-01-31 MED ORDER — IBUPROFEN 800 MG PO TABS: 800.0000 mg | ORAL_TABLET | Freq: Two times a day (BID) | ORAL | Status: DC | PRN

## 2013-01-31 MED ORDER — OXYCODONE-ACETAMINOPHEN 5-325 MG PO TABS: 1.0000 | ORAL_TABLET | ORAL | Status: DC | PRN

## 2013-01-31 MED ORDER — POLYETHYLENE GLYCOL 3350 17 G PO PACK
17.0000 g | PACK | Freq: Every day | ORAL | Status: DC
  Administered 2013-01-31: 17 g via ORAL
  Filled 2013-01-31: qty 1

## 2013-01-31 NOTE — Progress Notes (Signed)
Discharged to home with family office visits in place teaching done  

## 2013-01-31 NOTE — Progress Notes (Signed)
Physical Therapy Treatment Patient Details Name: [name] MRN: [id number] DOB: [birth year] Today's Date: 01/31/2013 Time: 7253-6644 PT Time Calculation (min): 45 min  PT Assessment / Plan / Recommendation Comments on Treatment Session  Pt s/p ray amputation/bypass presents with improved pain, improved mobility and motivated to return home and continue therapy with [facility] agency.  Supportive family. Safe in decision making and cautious given fall risk.  Ready to d/c home.  Has not had bowel movement so will keep on service in case pt does not d/d today.    Follow Up Recommendations  Home health PT;Supervision/Assistance - 24 hour     Does the patient have the potential to tolerate intense rehabilitation     Barriers to Discharge        Equipment Recommendations       Recommendations for Other Services    Frequency Min 4X/week   Plan Discharge plan remains appropriate;Frequency remains appropriate    Precautions / Restrictions Precautions Precautions: Fall Required Braces or Orthoses: Other Brace/Splint (darco shoe to surgical foot)   Pertinent Vitals/Pain Mild pulling sensation at vein harvest site    Mobility  Bed Mobility Bed Mobility: Supine to Sit;Sit to Supine Supine to Sit: 6: Modified independent (Device/Increase time);With rails;HOB elevated (30 degrees) Sit to Supine: 6: Modified independent (Device/Increase time);With rail;HOB elevated Details for Bed Mobility Assistance: Pt moves with some discomfort but safely with no need for external physical assistance, no need for safety cues and appropriate speed given status Transfers Transfers: Sit to Stand;Stand to Sit Sit to Stand: 5: Supervision;With upper extremity assist;From bed Stand to Sit: 5: Supervision;With upper extremity assist;To bed Details for Transfer Assistance: improved independence with verbal cues only for optimal positioning and increase use of leg strength to rise/eccentric control to  descend Ambulation/Gait Ambulation/Gait Assistance: 5: Supervision Ambulation Distance (Feet): 200 Feet Assistive device: Bilateral platform walker Ambulation/Gait Assistance Details: verbal cues for stride mechanics and timing, with occassional tendency to catch operated side on leg of walker, pt able to self correct. speed not measured but borderline slow Gait Pattern: Step-to pattern;Decreased weight shift to left;Trunk flexed (dependent on walker to off load operated foot) Stairs: No Wheelchair Mobility Wheelchair Mobility: No    Exercises Other Exercises Other Exercises: reviewed HEP provided by HHPT noting multiple seated/standing LE strengthening exercise.  Instructed pt on use of concentric power to stand, initial stability upon standing and eccentric control to sit and to incorporate technique into everyday practice   PT Diagnosis:    PT Problem List:   PT Treatment Interventions:     PT Goals Acute Rehab PT Goals PT Goal: Supine/Side to Sit - Progress: Progressing toward goal PT Goal: Sit to Supine/Side - Progress: Progressing toward goal PT Goal: Sit to Stand - Progress: Progressing toward goal PT Goal: Stand to Sit - Progress: Progressing toward goal PT Transfer Goal: Bed to Chair/Chair to Bed - Progress: Progressing toward goal PT Goal: Ambulate - Progress: Progressing toward goal  Visit Information  Last PT Received On: 01/31/13 Assistance Needed: +1    Subjective Data  Subjective: Ready to go home.  Getting up.  Some pain, could be the rheumatism Patient Stated Goal: home   Cognition  Cognition Arousal/Alertness: Awake/alert Behavior During Therapy: WFL for tasks assessed/performed Overall Cognitive Status: Within Functional Limits for tasks assessed    Balance     End of Session PT - End of Session Equipment Utilized During Treatment:  (darco shoe) Activity Tolerance: Patient tolerated treatment well Patient left: in  bed;with call bell/phone within  reach;with family/visitor present   GP     Dennis Bast 01/31/2013, 11:18 AM

## 2013-01-31 NOTE — Progress Notes (Addendum)
[facility] [place]  Progress Note Bypass Surgery  Date of Surgery: 01/28/2013  Procedure(s): BYPASS GRAFT FEMORAL-TIBIAL ARTERY AMPUTATION LEFT 5TH TOE VEIN HARVEST Surgeon: Surgeon(s): Nada Libman, MD  3 Days Post-Op  History of Present Illness  [name] is a 67 y.o. male who is S/P Procedure(s): BYPASS GRAFT FEMORAL-TIBIAL ARTERY AMPUTATION LEFT 5TH TOE VEIN HARVEST left.  The patient's pre-op symptoms of pain are Improved . Patients pain is well controlled on PO meds. He C/O soreness at vein harvest site.    Significant Diagnostic Studies: CBC Lab Results  Component Value Date   WBC 16.3* 01/29/2013   HGB 9.9* 01/29/2013   HCT 29.7* 01/29/2013   MCV 89.5 01/29/2013   PLT 223 01/29/2013    BMET     Component Value Date/Time   NA 137 01/30/2013 0344   K 4.1 01/30/2013 0344   CL 107 01/30/2013 0344   CO2 21 01/30/2013 0344   GLUCOSE 90 01/30/2013 0344   BUN 22 01/30/2013 0344   CREATININE 1.18 01/30/2013 0344   CALCIUM 8.4 01/30/2013 0344   GFRNONAA 62* 01/30/2013 0344   GFRAA 72* 01/30/2013 0344    COAG Lab Results  Component Value Date   INR 1.01 01/22/2013   INR 0.91 08/28/2012   INR 1.0 11/11/2007   No results found for this basename: PTT    Physical Examination  BP Readings from Last 3 Encounters:  01/31/13 127/64  01/31/13 127/64  01/22/13 101/63   Temp Readings from Last 3 Encounters:  01/31/13 98 F (36.7 C) Oral  01/31/13 98 F (36.7 C) Oral  01/22/13 97.9 F (36.6 C)    SpO2 Readings from Last 3 Encounters:  01/31/13 99%  01/31/13 99%  01/22/13 98%   Pulse Readings from Last 3 Encounters:  01/31/13 63  01/31/13 63  01/22/13 62    Pt is A&O x 3 left lower extremity: Incision/s is/are clean,dry.intact, and  healing without hematoma, erythema or drainage Limb is warm; with good color  Left Posterior tibial pulse is  palpable  Assessment/Plan: Pt. Doing well Post-op pain is controlled Wounds are healing  well PT/OT for ambulation  H/H ordered for home Poss DC today or tomorrow Continue wound care as ordered  ROCZNIAK,REGINA J  01/31/2013 7:55 AM  Addendum  I have independently interviewed and examined the patient, and I agree with the physician assistant's findings.  D/C later is all home arrangement completed and family ready to receive patient.  Leonides Sake, MD [facility] [place] Office: [phone] Pager: [phone]  01/31/2013, 9:14 AM

## 2013-01-31 NOTE — Progress Notes (Signed)
   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   Status of service:  Completed, signed off Medicare Important Message given?   (If response is "NO", the following Medicare IM given date fields will be blank) Date Medicare IM given:   Date Additional Medicare IM given:    Discharge Disposition:  HOME W HOME HEALTH SERVICES  Per UR Regulation:    If discussed at Long Length of Stay Meetings, dates discussed:    Comments:  01/31/2013 1230 NCM spoke to pt and states his wife is at home to assist. He agreeable to resuming services with [facility]. Will notify [facility] of pt's scheduled dc home today. Isidoro Donning RN CCM Case Mgmt phone [phone]

## 2013-02-01 ENCOUNTER — Telehealth: Payer: Self-pay | Admitting: Surgery

## 2013-02-01 NOTE — Discharge Summary (Signed)
I agree with the above  Wells Sarahjane Matherly 

## 2013-02-01 NOTE — Telephone Encounter (Addendum)
Message copied by Fredrich Birks on Mon Feb 01, 2013  3:39 PM ------      Message from: Melene Plan      Created: Mon Feb 01, 2013 11:25 AM                   ----- Message -----         From: Marlowe Shores, PA-C         Sent: 02/01/2013  11:02 AM           To: Melene Plan, RN, [facility] Admin Pool            2-3 week f/u left fem-pop and 5th toe amp with sutures - [place] ------  02/01/13: spoke with patient, dpm

## 2013-02-01 NOTE — Discharge Summary (Signed)
[facility] Discharge Summary   Patient ID:  [name] MRN: [id number] DOB/AGE: 02/01/1946 67 y.o.  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 Hyperlipidemia   . Hypertension   . PVD (peripheral vascular disease)     Left iliac PCI/stent  . Cerebrovascular disease   . Tobacco abuse, in remission     Remote  . GERD (gastroesophageal reflux disease)   . Hypothyroid   . Allergic rhinitis   . Chronic lung disease     ?  Rheumatoid lung; ?  Adverse reaction to methotrexate  . Schatzki's ring   . Hx of Clostridium difficile infection   . Achalasia   . Hiatal hernia   . Coronary artery disease   . Heart murmur   . Shortness of breath     occ  . Diabetes mellitus     borderline no meds  . Stones in the urinary tract   . Atrial fibrillation   . Chronic back pain   . Rheumatoid arthritis     with nephritis  . Cervical spondylosis   . DDD (degenerative disc disease), lumbar   . History of kidney stones 2010    s/p lithotripsy  . Chronic kidney disease     pt denies chronic kidney disease  . Soft enlarged prostate   . Pneumonia   . Anginal pain     none as of 01/19/13    Procedure(s): BYPASS GRAFT FEMORAL-TIBIAL ARTERY AMPUTATION LEFT 5TH TOE VEIN HARVEST  Discharged Condition: good  HPI:  [name] is a 67 y.o. male who had left great toe pain which was relieved with "gout medication". Pt states his toes became purple and mottled and he had blister on left small toe which is very painful. He had left iliac stents placed by myself in 2009 for claudication and has done well until the ankle  fracture. He denies night or rest pain except in small toe on left He also has a small healing ulcer on lateral malleolus. He has developed ischemic changes to his left fifth toe. He underwent angiogram which revealed an occluded popliteal and tibial vessels with reconstitution of the posterior tibial artery in the midcalf. He has also undergone vein mapping which hasn't have an adequate vein down to the knee. He was originally scheduled for posterior tibial artery bypass graft 2 weeks ago, however he developed C. Difficile colitis and was admitted to the hospital. His operation was canceled. He's back today to have this procedure performed. He continues to complain of rest pain in his left fifth toe.    Hospital Course:  [name] is a 67 y.o. male is S/P Left Procedure(s): BYPASS GRAFT FEMORAL-TIBIAL ARTERY AMPUTATION LEFT 5TH TOE VEIN HARVEST Extubated: POD # 0 Physical exam: palpable PT pulse in the left foot 5th toe amp site clean and healing well Post-op wounds clean, dry, intact and healing well Pt. Ambulating, voiding and taking PO diet without difficulty. Pt pain controlled with PO pain meds. Labs as below Complications:none  Consults:     Significant Diagnostic Studies: CBC Lab Results  Component Value Date   WBC 11.3* 01/31/2013   HGB 8.5* 01/31/2013  HCT 25.5* 01/31/2013   MCV 89.8 01/31/2013   PLT 182 01/31/2013    BMET    Component Value Date/Time   NA 142 01/31/2013 0410   K 3.5 01/31/2013 0410   CL 108 01/31/2013 0410   CO2 23 01/31/2013 0410   GLUCOSE 66* 01/31/2013 0410   BUN 14 01/31/2013 0410   CREATININE 1.06 01/31/2013 0410   CALCIUM 8.7 01/31/2013 0410   GFRNONAA 71* 01/31/2013 0410   GFRAA 82* 01/31/2013 0410   COAG Lab Results  Component Value Date   INR 1.01 01/22/2013   INR 0.91 08/28/2012   INR 1.0 11/11/2007     Disposition:  Discharge to :Home Discharge Orders   Future Appointments Provider Department Dept Phone   02/09/2013 1:00 PM Kathlen Brunswick, MD [facility] [place] [phone]   Future Orders Complete By Expires     Call MD for:  redness, tenderness, or signs of infection (pain, swelling, bleeding, redness, odor or green/yellow discharge around incision site)  As directed     Call MD for:  severe or increased pain, loss or decreased feeling  in affected limb(s)  As directed     Call MD for:  temperature >100.5  As directed     Increase activity slowly  As directed     Comments:      Walk with assistance use walker or cane as needed    May shower   As directed     Resume previous diet  As directed     Walker   As directed         Medication List    STOP taking these medications       HYDROcodone-acetaminophen 10-325 MG per tablet  Commonly known as:  NORCO      TAKE these medications       acetaminophen 500 MG tablet  Commonly known as:  TYLENOL  Take 500 mg by mouth 2 (two) times daily.     aspirin EC 81 MG tablet  Take 81 mg by mouth daily.     atenolol 25 MG tablet  Commonly known as:  TENORMIN  Take 25 mg by mouth daily.     cholecalciferol 400 UNITS Tabs  Commonly known as:  VITAMIN D  Take 400 Units by mouth daily.     Fish Oil 500 MG Caps  Take 1 capsule by mouth daily.     folic acid 1 MG tablet  Commonly known as:  FOLVITE  Take 1 mg by mouth daily.     ibuprofen 800 MG tablet  Commonly known as:  ADVIL,MOTRIN  Take 1 tablet (800 mg total) by mouth 2 (two) times daily as needed for pain.     levothyroxine 100 MCG tablet  Commonly known as:  SYNTHROID, LEVOTHROID  Take 1 tablet (100 mcg total) by mouth daily.     lisinopril 20 MG tablet  Commonly known as:  PRINIVIL,ZESTRIL  Take 20 mg by mouth daily.     omeprazole 20 MG capsule  Commonly known as:  PRILOSEC  Take 20 mg by mouth 2 (two) times daily.     oxyCODONE-acetaminophen 5-325 MG per tablet  Commonly known as:  PERCOCET/ROXICET  Take 1-2 tablets by mouth every 4 (four) hours as needed.     pravastatin  80 MG tablet  Commonly known as:  PRAVACHOL  Take 80 mg by mouth at bedtime.     predniSONE 10 MG tablet  Commonly known as:  DELTASONE  Take 1 tablet (10 mg total) by mouth daily.     Teriparatide (Recombinant) 600 MCG/2.4ML Soln  Inject 20 mcg into the skin daily.     triamcinolone 0.1 % cream : eucerin Crea  Apply 1 application topically daily.     trolamine salicylate 10 % cream  Commonly known as:  ASPERCREME  Apply 1 application topically at bedtime as needed (for rheumatoid arthritis).     Vitamin D (Ergocalciferol) 50000 UNITS Caps  Commonly known as:  DRISDOL  Take 50,000 Units by mouth every 7 (seven) days. On Mondays       Verbal and written Discharge instructions given to the patient. Wound care per Discharge AVS     Follow-up Information   Follow up with [facility]. [facility], Occupational Therapy and RN)    Contact information:   [phone]      Signed: Marlowe Shores 02/01/2013, 11:09 AM  - For VQI Registry use --- Instructions: Press F2 to tab through selections.  Delete question if not applicable.   Post-op:  Wound infection: No  Graft infection: No  Transfusion: No  If yes,  units given New Arrhythmia: No Ipsilateral amputation: [x ] no, [ ]  Minor, [ ]  BKA, [ ]  AKA Discharge patency: [x ] Primary, [ ]  Primary assisted, [ ]  Secondary, [ ]  Occluded Patency judged by: [ ]  Dopper only, [ ]  Palpable graft pulse, [x ] Palpable distal pulse, [ ]  ABI inc. > 0.15, [ ]  Duplex Discharge ABI: not done Discharge TBI: not done D/C Ambulatory Status: Ambulatory with Assistance  Complications: MI: [x ] No, [ ]  Troponin only, [ ]  EKG or Clinical CHF: No Resp failure: [x ] none, [ ]  Pneumonia, [ ]  Ventilator Chg in renal function: [x ] none, [ ]  Inc. Cr > 0.5, [ ]  Temp. Dialysis, [ ]  Permanent dialysis Stroke: [x ] None, [ ]  Minor, [ ]  Major Return to OR: No  Reason for return to OR: [ ]  Bleeding, [ ]  Infection, [ ]   Thrombosis, [ ]  Revision  Discharge medications: Statin use:  Yes ASA use:  Yes Plavix use:  No  for medical reason none prescribed Beta blocker use: Yes Coumadin use: No  for medical reason not prescribed

## 2013-02-08 ENCOUNTER — Ambulatory Visit (INDEPENDENT_AMBULATORY_CARE_PROVIDER_SITE_OTHER): Payer: Medicare Other | Admitting: Family Medicine

## 2013-02-08 ENCOUNTER — Encounter: Payer: Self-pay | Admitting: Family Medicine

## 2013-02-08 VITALS — BP 128/78 | Temp 97.8°F | Wt 141.0 lb

## 2013-02-08 DIAGNOSIS — L8991 Pressure ulcer of unspecified site, stage 1: Secondary | ICD-10-CM

## 2013-02-08 DIAGNOSIS — L8990 Pressure ulcer of unspecified site, unspecified stage: Secondary | ICD-10-CM

## 2013-02-08 MED ORDER — ZOLPIDEM TARTRATE 5 MG PO TABS: 5.0000 mg | ORAL_TABLET | Freq: Every evening | ORAL | Status: DC | PRN

## 2013-02-08 MED ORDER — MUPIROCIN 2 % EX OINT: TOPICAL_OINTMENT | CUTANEOUS | Status: DC

## 2013-02-08 NOTE — Patient Instructions (Addendum)
Apply small amount of ointment to the spot on Buttock twice a day.  If getting larger with time please notify us.   Do labwork and office visit in Sept

## 2013-02-08 NOTE — Progress Notes (Signed)
  Subjective:    Patient ID: [name], male    DOB: 1946-01-19, 67 y.o.   MRN: [id number]  Rash The current episode started 1 to 4 weeks ago. The affected locations include the left buttock. The rash is characterized by redness and burning.  He states he has a small soreness but I has been present for the past 24 hours she relates some tenderness. Denies any injury to it. Wondered what may be causing it. He states it started after he was in the hospital. He was bound for a while due to surgery on his left foot Past medical history arthritis Family history noncontributory Social doesn't smoke    Review of Systems  Skin: Positive for rash.       Objective:   Physical Exam Lungs are clear heart is regular the buttock area does show a small sore on the lower tablet region with minimal cellulitis no other particular problems.     Assessment & Plan:  I believe this is a small sacral decubiti I recommend Bactroban ointment if it starts enlarging he ought to followup he should gradually get better he has a home health nurse that comes out to check his foot there look at that area if it gets worse he'll followup here

## 2013-02-09 ENCOUNTER — Ambulatory Visit: Payer: Medicare Other | Admitting: Cardiology

## 2013-02-12 ENCOUNTER — Telehealth: Payer: Self-pay | Admitting: Family Medicine

## 2013-02-12 MED ORDER — PREDNISONE 10 MG PO TABS: 10.0000 mg | ORAL_TABLET | Freq: Every day | ORAL | Status: DC

## 2013-02-12 NOTE — Telephone Encounter (Signed)
Prednisone 10mg  e sent to [facility] in [place].

## 2013-02-12 NOTE — Telephone Encounter (Signed)
Prednisone 10 mg #21.     1 daily.    Keep regular follow up

## 2013-02-12 NOTE — Telephone Encounter (Signed)
Patient would like prednisone 10 mg for his joints called into [facility]. He was prescribed this by the hospital. He is also requesting to have some refills with this.

## 2013-02-18 ENCOUNTER — Other Ambulatory Visit: Payer: Self-pay | Admitting: *Deleted

## 2013-02-18 ENCOUNTER — Telehealth: Payer: Self-pay | Admitting: Surgery

## 2013-02-18 DIAGNOSIS — I739 Peripheral vascular disease, unspecified: Secondary | ICD-10-CM

## 2013-02-18 MED ORDER — TRAMADOL HCL 50 MG PO TABS: 50.0000 mg | ORAL_TABLET | Freq: Four times a day (QID) | ORAL | Status: DC | PRN

## 2013-02-18 NOTE — Telephone Encounter (Signed)
Pt would like a refill on his pain meds.  Perocect 5.3 25mg . [facility] [place] [phone] [phone]

## 2013-02-19 ENCOUNTER — Encounter: Payer: Self-pay | Admitting: Surgery

## 2013-02-22 ENCOUNTER — Encounter: Payer: Self-pay | Admitting: Surgery

## 2013-02-22 ENCOUNTER — Ambulatory Visit (INDEPENDENT_AMBULATORY_CARE_PROVIDER_SITE_OTHER): Payer: Medicare Other | Admitting: Surgery

## 2013-02-22 ENCOUNTER — Other Ambulatory Visit: Payer: Self-pay | Admitting: *Deleted

## 2013-02-22 VITALS — BP 146/68 | HR 57 | Temp 97.7°F | Ht 68.0 in | Wt 143.3 lb

## 2013-02-22 DIAGNOSIS — Z48812 Encounter for surgical aftercare following surgery on the circulatory system: Secondary | ICD-10-CM

## 2013-02-22 DIAGNOSIS — I739 Peripheral vascular disease, unspecified: Secondary | ICD-10-CM

## 2013-02-22 NOTE — Progress Notes (Signed)
The patient is back today for his first postoperative followup. He is status post distal left superficial femoral to posterior tibial artery bypass graft with ipsilateral non-reversed, translocated saphenous vein. He also underwent a left fifth toe amputation with metatarsal head. This was performed on 01/28/2013. This was done in the setting of an ischemic left fifth toe. He states today that he is now pain free and is doing very well.  I removed the 3 sutures from his toe amputation site which is healing nicely. He has an excellent Doppler signal in his bypass graft.  I have encouraged him to continue wearing his Darco shoe for the next 6 weeks. I have scheduled him to come back and see me in 2 months with a repeat duplex. Overall he is doing very well

## 2013-03-02 ENCOUNTER — Other Ambulatory Visit: Payer: Self-pay | Admitting: Family Medicine

## 2013-03-03 ENCOUNTER — Telehealth: Payer: Self-pay | Admitting: Family Medicine

## 2013-03-03 NOTE — Telephone Encounter (Signed)
Patient needs a refill on prednisone 10 MG tablet  to [facility] in [place].  States [facility] sent the request to Dr. Gerda Diss yesterday, However the pharmacist does not have any return messages regarding this medication.  Patient states he needs this medication on a daily basis and he is currently out. Please call patient as soon as possible.  Thanks

## 2013-03-03 NOTE — Telephone Encounter (Signed)
RX refill request faxed back to pharmacy. Patient was notified.

## 2013-03-07 ENCOUNTER — Other Ambulatory Visit: Payer: Self-pay | Admitting: Cardiology

## 2013-03-08 ENCOUNTER — Other Ambulatory Visit: Payer: Medicare Other

## 2013-03-08 ENCOUNTER — Ambulatory Visit: Payer: Medicare Other | Admitting: Surgery

## 2013-03-08 NOTE — Telephone Encounter (Signed)
Medication sent via escribe.  

## 2013-03-18 ENCOUNTER — Encounter: Payer: Self-pay | Admitting: Cardiology

## 2013-03-18 ENCOUNTER — Ambulatory Visit (INDEPENDENT_AMBULATORY_CARE_PROVIDER_SITE_OTHER): Payer: Medicare Other | Admitting: Cardiology

## 2013-03-18 VITALS — BP 120/68 | HR 64 | Ht 68.0 in | Wt 146.1 lb

## 2013-03-18 DIAGNOSIS — I679 Cerebrovascular disease, unspecified: Secondary | ICD-10-CM

## 2013-03-18 DIAGNOSIS — E782 Mixed hyperlipidemia: Secondary | ICD-10-CM

## 2013-03-18 DIAGNOSIS — I2510 Atherosclerotic heart disease of native coronary artery without angina pectoris: Secondary | ICD-10-CM

## 2013-03-18 DIAGNOSIS — I739 Peripheral vascular disease, unspecified: Secondary | ICD-10-CM

## 2013-03-18 DIAGNOSIS — K219 Gastro-esophageal reflux disease without esophagitis: Secondary | ICD-10-CM

## 2013-03-18 MED ORDER — ATORVASTATIN CALCIUM 40 MG PO TABS: 40.0000 mg | ORAL_TABLET | Freq: Every day | ORAL | Status: DC

## 2013-03-18 NOTE — Progress Notes (Signed)
Patient ID: [name], male   DOB: [birth year], 67 y.o.   MRN: [id number]  HPI: Scheduled return visit for this extraordinarily nice gentleman with multiple medical problems including peripheral vascular disease, cerebrovascular disease, coronary artery disease and rheumatoid arthritis. Since his last visit, he required laparoscopic surgery to correct GERD, hiatal hernia and esophageal stricture. He also underwent a left SFA-popliteal bypass graft procedure with reverse saphenous vein. Amputation of the left fifth toe was also required. He has recovered well from these problems plus recurrent C. difficile infections.  Current Outpatient Prescriptions  Medication Sig Dispense Refill  . ALPRAZolam (XANAX) 0.5 MG tablet Take 0.5 mg by mouth daily.      Marland Kitchen aspirin EC 81 MG tablet Take 81 mg by mouth daily.        Marland Kitchen atenolol (TENORMIN) 50 MG tablet TAKE 1 TABLET BY MOUTH EVERY DAY  30 tablet  3  . B Complex Vitamins (VITAMIN B COMPLEX PO) Take 1 tablet by mouth daily.      . chlorzoxazone (PARAFON) 500 MG tablet Take 500 mg by mouth 3 (three) times daily as needed for muscle spasms.      . cholecalciferol (VITAMIN D) 400 UNITS TABS Take 400 Units by mouth daily.       . folic acid (FOLVITE) 1 MG tablet Take 1 mg by mouth daily.        Marland Kitchen HYDROcodone-acetaminophen (NORCO) 10-325 MG per tablet Take 1 tablet by mouth every 4 (four) hours as needed for pain.      Marland Kitchen ibuprofen (ADVIL,MOTRIN) 800 MG tablet TAKE 1 TABLET BY MOUTH THREE TIMES DAILY  270 tablet  0  . levothyroxine (SYNTHROID, LEVOTHROID) 100 MCG tablet Take 1 tablet (100 mcg total) by mouth daily.  90 tablet  5  . lisinopril (PRINIVIL,ZESTRIL) 20 MG tablet Take 20 mg by mouth daily.        . Melatonin 3 MG TABS Take 1 tablet by mouth at bedtime.      . Multiple Vitamin (MULTIVITAMIN) capsule Take 1 capsule by mouth daily.      . Omega-3 Fatty Acids (FISH OIL) 500 MG CAPS Take 1 capsule by mouth daily.      Marland Kitchen omeprazole (PRILOSEC) 20 MG capsule  Take 20 mg by mouth 2 (two) times daily.      . potassium gluconate 595 MG TABS Take 595 mg by mouth daily.      . pravastatin (PRAVACHOL) 80 MG tablet Take 80 mg by mouth at bedtime.       . predniSONE (DELTASONE) 10 MG tablet Take 1 tablet (10 mg total) by mouth daily.  21 tablet  0  . tamsulosin (FLOMAX) 0.4 MG CAPS Take 0.4 mg by mouth daily.       . Teriparatide, Recombinant, (FORTEO) 600 MCG/2.4ML SOLN Inject 20 mcg into the skin daily.      . Vitamin D, Ergocalciferol, (DRISDOL) 50000 UNITS CAPS Take 50,000 Units by mouth every 7 (seven) days. On Mondays      . atorvastatin (LIPITOR) 40 MG tablet Take 1 tablet (40 mg total) by mouth daily.  90 tablet  1   No current facility-administered medications for this visit.   Allergies  Allergen Reactions  . Amoxicillin Hives and Other (See Comments)    Hallucinations   . Ciprofloxacin Other (See Comments)    C-diff  . Doxycycline Nausea And Vomiting  . Levofloxacin Nausea And Vomiting     Past medical history, social history, and family history  reviewed and updated.  ROS: Denies chest pain, dyspnea, palpitations, lightheadedness or syncope. All other systems reviewed and are negative.  PHYSICAL EXAM: BP 120/68  Pulse 64  Ht 5\' 8"  (1.727 m)  Wt 66.28 kg (146 lb 1.9 oz)  BMI 22.22 kg/m2;  Body mass index is 22.22 kg/(m^2). Weight was 180 pounds prior to initial infection with C. difficile General-Well developed; no acute distress Body habitus-proportionate weight and height Neck-No JVD; no carotid bruits Lungs-clear lung fields; resonant to percussion; mild prolongation of the expiratory phase Cardiovascular-normal PMI; normal S1 and S2; modest systolic ejection murmur Abdomen-normal bowel sounds; soft and non-tender without masses or organomegaly Musculoskeletal- no cyanosis or clubbing; rheumatoid deformities, most notable at the right MCP joints Neurologic-Normal cranial nerves; symmetric strength and tone Skin-Warm, no  significant lesions Extremities-distal pulses 0-1+; no edema  [place] Bing, MD 03/18/2013  5:05 PM  ASSESSMENT AND PLAN

## 2013-03-18 NOTE — Progress Notes (Deleted)
Name: [name]    DOB: December 21, 1945  Age: 67 y.o.  MR#: [id number]       PCP:  [name], MD      Insurance: Payor: MEDICARE / Plan: MEDICARE PART A AND B / Product Type: *No Product type* /   CC:   No chief complaint on file.  List  VS Filed Vitals:   03/18/13 1505  BP: 120/68  Pulse: 64  Height: 5\' 8"  (1.727 m)  Weight: 146 lb 1.9 oz (66.28 kg)    Weights Current Weight  03/18/13 146 lb 1.9 oz (66.28 kg)  02/22/13 143 lb 4.8 oz (65 kg)  02/08/13 141 lb (63.957 kg)    Blood Pressure  BP Readings from Last 3 Encounters:  03/18/13 120/68  02/22/13 146/68  02/08/13 128/78     Admit date:  (Not on file) Last encounter with RMR:  03/07/2013   Allergy Amoxicillin; Ciprofloxacin; Doxycycline; and Levofloxacin  Current Outpatient Prescriptions  Medication Sig Dispense Refill  . ALPRAZolam (XANAX) 0.5 MG tablet Take 0.5 mg by mouth daily.      Marland Kitchen aspirin EC 81 MG tablet Take 81 mg by mouth daily.        Marland Kitchen atenolol (TENORMIN) 50 MG tablet TAKE 1 TABLET BY MOUTH EVERY DAY  30 tablet  3  . B Complex Vitamins (VITAMIN B COMPLEX PO) Take 1 tablet by mouth daily.      . chlorzoxazone (PARAFON) 500 MG tablet Take 500 mg by mouth 3 (three) times daily as needed for muscle spasms.      . cholecalciferol (VITAMIN D) 400 UNITS TABS Take 400 Units by mouth daily.       . folic acid (FOLVITE) 1 MG tablet Take 1 mg by mouth daily.        Marland Kitchen HYDROcodone-acetaminophen (NORCO) 10-325 MG per tablet Take 1 tablet by mouth every 4 (four) hours as needed for pain.      Marland Kitchen ibuprofen (ADVIL,MOTRIN) 800 MG tablet TAKE 1 TABLET BY MOUTH THREE TIMES DAILY  270 tablet  0  . levothyroxine (SYNTHROID, LEVOTHROID) 100 MCG tablet Take 1 tablet (100 mcg total) by mouth daily.  90 tablet  5  . lisinopril (PRINIVIL,ZESTRIL) 20 MG tablet Take 20 mg by mouth daily.        . Melatonin 3 MG TABS Take 1 tablet by mouth at bedtime.      . Multiple Vitamin (MULTIVITAMIN) capsule Take 1 capsule by mouth daily.      .  Omega-3 Fatty Acids (FISH OIL) 500 MG CAPS Take 1 capsule by mouth daily.      Marland Kitchen omeprazole (PRILOSEC) 20 MG capsule Take 20 mg by mouth 2 (two) times daily.      . potassium gluconate 595 MG TABS Take 595 mg by mouth daily.      . pravastatin (PRAVACHOL) 80 MG tablet Take 80 mg by mouth at bedtime.       . predniSONE (DELTASONE) 10 MG tablet Take 1 tablet (10 mg total) by mouth daily.  21 tablet  0  . tamsulosin (FLOMAX) 0.4 MG CAPS Take 0.4 mg by mouth daily.       . Teriparatide, Recombinant, (FORTEO) 600 MCG/2.4ML SOLN Inject 20 mcg into the skin daily.      . Vitamin D, Ergocalciferol, (DRISDOL) 50000 UNITS CAPS Take 50,000 Units by mouth every 7 (seven) days. On Mondays       No current facility-administered medications for this visit.    Discontinued  Meds:    Medications Discontinued During This Encounter  Medication Reason  . acetaminophen (TYLENOL) 500 MG tablet Error  . atenolol (TENORMIN) 25 MG tablet Error  . mupirocin ointment (BACTROBAN) 2 % Error  . oxyCODONE-acetaminophen (PERCOCET/ROXICET) 5-325 MG per tablet Error  . Teriparatide, Recombinant, 600 MCG/2.4ML SOLN Error  . traMADol (ULTRAM) 50 MG tablet Error  . zolpidem (AMBIEN) 5 MG tablet Error  . Hydrocodone-APAP-Dietary Prod (HYDROCODONE-APAP-NUTRIT SUPP) 10-325 MG MISC Error  . Levothyroxine Sodium 100 MCG CAPS Error  . lisinopril (PRINIVIL,ZESTRIL) 20 MG tablet Error  . ibuprofen (ADVIL,MOTRIN) 800 MG tablet Error  . atenolol (TENORMIN) 50 MG tablet Error  . pravastatin (PRAVACHOL) 80 MG tablet Error  . trolamine salicylate (ASPERCREME) 10 % cream Error    Patient Active Problem List   Diagnosis Date Noted  . C. difficile diarrhea 01/10/2013  . Dehydration 01/10/2013  . Peripheral vascular disease, unspecified 12/31/2012  . Atherosclerosis of native arteries of the extremities with ulceration(440.23) 12/31/2012  . Other specified acquired hypothyroidism 12/14/2012  . 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 Achalasia 12/18/2011  . History of Clostridium difficile infection 12/18/2011  . Rheumatoid arthritis(714.0)   . Tobacco abuse, in remission   . Diabetes mellitus   . Chronic lung disease   . Cervical spondylosis   . Cerebrovascular disease 06/05/2011  . Hypertension 06/05/2011  . HYPOTHYROIDISM 03/18/2010  . ATHEROSCLEROTIC CARDIOVASCULAR DISEASE 03/18/2010  . Peripheral vascular disease 03/18/2010  . GASTROESOPHAGEAL REFLUX DISEASE 03/18/2010  . Hyperlipidemia 03/07/2008    LABS    Component Value Date/Time   NA 142 01/31/2013 0410   NA 137 01/30/2013 0344   NA 140 01/29/2013 0430   K 3.5 01/31/2013 0410   K 4.1 01/30/2013 0344   K 4.4 01/29/2013 0430   CL 108 01/31/2013 0410   CL 107 01/30/2013 0344   CL 108 01/29/2013 0430   CO2 23 01/31/2013 0410   CO2 21 01/30/2013 0344   CO2 22 01/29/2013 0430   GLUCOSE 66* 01/31/2013 0410   GLUCOSE 90 01/30/2013 0344   GLUCOSE 82 01/29/2013 0430   BUN 14 01/31/2013 0410   BUN 22 01/30/2013 0344   BUN 23 01/29/2013 0430   CREATININE 1.06 01/31/2013 0410   CREATININE 1.18 01/30/2013 0344   CREATININE 1.14 01/29/2013 0430   CALCIUM 8.7 01/31/2013 0410   CALCIUM 8.4 01/30/2013 0344   CALCIUM 8.4 01/29/2013 0430   GFRNONAA 71* 01/31/2013 0410   GFRNONAA 62* 01/30/2013 0344   GFRNONAA 65* 01/29/2013 0430   GFRAA 82* 01/31/2013 0410   GFRAA 72* 01/30/2013 0344   GFRAA 75* 01/29/2013 0430   CMP     Component Value Date/Time   NA 142 01/31/2013 0410   K 3.5 01/31/2013 0410   CL 108 01/31/2013 0410   CO2 23 01/31/2013 0410   GLUCOSE 66* 01/31/2013 0410   BUN 14 01/31/2013 0410   CREATININE 1.06 01/31/2013 0410   CALCIUM 8.7 01/31/2013 0410   PROT 7.3 01/22/2013 1456   ALBUMIN 3.6 01/22/2013 1456   AST 35 01/22/2013 1456   AST 35 12/12/2010 1126   ALT 38 01/22/2013 1456   ALKPHOS 105 01/22/2013 1456   ALKPHOS 54 12/12/2010 1126   BILITOT 0.3 01/22/2013 1456   BILITOT 0.6 12/12/2010 1126   GFRNONAA 71* 01/31/2013 0410   GFRAA  82* 01/31/2013 0410       Component Value Date/Time   WBC 11.3* 01/31/2013 0410   WBC 16.3*  01/29/2013 0430   WBC 21.0* 01/28/2013 1706   HGB 8.5* 01/31/2013 0410   HGB 9.9* 01/29/2013 0430   HGB 11.8* 01/28/2013 1706   HCT 25.5* 01/31/2013 0410   HCT 29.7* 01/29/2013 0430   HCT 34.9* 01/28/2013 1706   MCV 89.8 01/31/2013 0410   MCV 89.5 01/29/2013 0430   MCV 89.7 01/28/2013 1706    Lipid Panel     Component Value Date/Time   CHOL 300* 11/30/2012 0848   TRIG 708* 11/30/2012 0848   HDL 46 11/30/2012 0848   CHOLHDL 6.5 11/30/2012 0848   VLDL NOT CALC 11/30/2012 0848   LDLCALC Comment:   Not calculated due to Triglyceride >400. Suggest ordering Direct LDL (Unit Code: 78295).   Total Cholesterol/HDL Ratio:CHD Risk                        Coronary Heart Disease Risk Table                                        Men       Women          1/2 Average Risk              3.4        3.3              Average Risk              5.0        4.4           2X Average Risk              9.6        7.1           3X Average Risk             23.4       11.0 Use the calculated Patient Ratio above and the CHD Risk table  to determine the patient's CHD Risk. ATP III Classification (LDL):       < 100        mg/dL         Optimal      621 - 129     mg/dL         Near or Above Optimal      130 - 159     mg/dL         Borderline High      160 - 189     mg/dL         High       > 308        mg/dL         Very High   6/57/8469 0848    ABG    Component Value Date/Time   PHART 7.400 02/03/2008 0925   PCO2ART 30.4* 02/03/2008 0925   PO2ART 87.9 02/03/2008 0925   HCO3 18.4* 02/03/2008 0925   TCO2 16 01/01/2013 0937   ACIDBASEDEF 5.5* 02/03/2008 0925   O2SAT 96.8 02/03/2008 0925     Lab Results  Component Value Date   TSH 0.413 09/27/2010   BNP (last 3 results) No results found for this basename: PROBNP,  in the last 8760 hours Cardiac Panel (last 3 results) No results found for this basename: CKTOTAL, CKMB, TROPONINI, RELINDX,  in the last  72 hours  Iron/TIBC/Ferritin No results found for this basename: iron, tibc, ferritin     EKG Orders placed during the hospital encounter of 01/01/13  . EKG  . EKG 12-LEAD  . EKG 12-LEAD     Prior Assessment and Plan Problem List as of 03/18/2013   HYPOTHYROIDISM   Last Assessment & Plan   09/25/2011 Office Visit Written 09/25/2011 11:57 AM by Kathlen Brunswick, MD     TSH was normal in 09/2010 on current replacement therapy.    Hyperlipidemia   Last Assessment & Plan   08/10/2012 Office Visit Written 08/10/2012  4:36 PM by Kathlen Brunswick, MD     Concomitant use of colchicine and most statins is relatively contraindicated.  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  Colchicine will be discontinued and atorvastatin started a dose of 40 mg per day.    ATHEROSCLEROTIC CARDIOVASCULAR DISEASE   Last Assessment & Plan   08/10/2012 Office Visit Edited 08/12/2012 11:11 AM by Kathlen Brunswick, MD     Very stable course since CABG surgery 15 years ago.  In the absence of symptoms, and considering the relatively low physiologic stress represented by the planned orthopaedic surgical procedure, perioperative risk of an acute ischemic event is low.  Dose of beta blockers will be increased in light of patient's mild hypertension and tachycardia.  Dr. Cleophas Dunker can proceed with ORIF surgery as required to assist with fracture healing.  [facility] will be available in hospital if needed.    Peripheral vascular disease   Last Assessment & Plan   08/10/2012 Office Visit Written 08/10/2012  4:28 PM by Kathlen Brunswick, MD     Peripheral vascular disease has been stable without definite symptoms of claudication.  No intervention is warranted at present.    GASTROESOPHAGEAL REFLUX DISEASE   Last Assessment & Plan   12/18/2011 Office Visit Written 12/18/2011 10:01 AM by Tiffany Kocher, PA     Doing very well on omeprazole 20 mg twice a  day. Continue chronically. Office visit when necessary.    Cerebrovascular disease   Last Assessment & Plan   08/10/2012 Office Visit Written 08/10/2012  4:37 PM by Kathlen Brunswick, MD     Atherosclerotic disease was present more than 2 years ago.  A repeat carotid ultrasound will be undertaken.  If results are stable, imaging every 2-3 years should be adequate.    Hypertension   Last Assessment & Plan   08/10/2012 Office Visit Written 08/10/2012  4:37 PM by Kathlen Brunswick, MD     Blood pressure control is slightly suboptimal.  Atenolol dosage will be increased.    Rheumatoid arthritis(714.0)   Last Assessment & Plan   09/25/2011 Office Visit Edited 09/29/2011 12:24 PM by Kathlen Brunswick, MD     Therapy has been limited by recent GI problems, as patient has been advised not to use nonsteroidal medication.  Hopefully, anti-inflammatory medication can be resumed once his GI evaluation and treatment have been completed.    Tobacco abuse, in remission   Diabetes mellitus   Chronic lung disease   Cervical spondylosis   Achalasia   Last Assessment & Plan   12/18/2011 Office Visit Written 12/18/2011 10:00 AM by Tiffany Kocher, PA     Status post surgery as outlined above. He is doing very well. Appetite is improving. He's been able eat soft meats, bread this week. He has one more followup planned with  his Careers adviser. Denies any heartburn.    History of Clostridium difficile infection   Last Assessment & Plan   12/18/2011 Office Visit Written 12/18/2011 10:02 AM by Tiffany Kocher, PA     Avoid unnecessary antibiotics. Recommend probiotics especially with antibiotics and for one month afterwards. He will let us know if he has any alteration in his bowel habits, diarrhea.    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 difficile diarrhea   Dehydration       Imaging: No results found.

## 2013-03-18 NOTE — Patient Instructions (Addendum)
Your physician recommends that you schedule a follow-up appointment in: 8 months with DR K  Your physician recommends that you return for lab work in: ONE MONTH FOR FLP Your physician recommends that you have follow up lab work, we will mail you a reminder letter to alert you when to go [employer], located across the street from our office.   Your physician has recommended you make the following change in your medication:   1) STOP TAKING PRAVASTATIN 2) START TAKING ATORVASTATIN 40MG  ONCE DAILY

## 2013-03-19 ENCOUNTER — Encounter: Payer: Self-pay | Admitting: Cardiology

## 2013-03-19 NOTE — Assessment & Plan Note (Addendum)
Patient has done well with respect to coronary disease despite total occlusion of a vein graft to an occluded RCA with modest ischemia in that vascular territory. We will continue to optimize treatment of cardiovascular risk factors.

## 2013-03-19 NOTE — Assessment & Plan Note (Addendum)
Patient has had known but mild cerebrovascular disease. A repeat imaging study within the next 24-36 months would be reasonable.

## 2013-03-19 NOTE — Assessment & Plan Note (Signed)
Doing well following surgery with good perfusion to both lower extremities and no remaining skin ulceration.

## 2013-03-25 ENCOUNTER — Other Ambulatory Visit: Payer: Self-pay | Admitting: *Deleted

## 2013-03-25 MED ORDER — PREDNISONE 10 MG PO TABS: 10.0000 mg | ORAL_TABLET | Freq: Every day | ORAL | Status: DC

## 2013-04-01 ENCOUNTER — Ambulatory Visit (INDEPENDENT_AMBULATORY_CARE_PROVIDER_SITE_OTHER): Payer: Medicare Other | Admitting: Family Medicine

## 2013-04-01 ENCOUNTER — Encounter: Payer: Self-pay | Admitting: Family Medicine

## 2013-04-01 VITALS — BP 110/80 | HR 70 | Wt 150.5 lb

## 2013-04-01 DIAGNOSIS — E785 Hyperlipidemia, unspecified: Secondary | ICD-10-CM

## 2013-04-01 DIAGNOSIS — I2510 Atherosclerotic heart disease of native coronary artery without angina pectoris: Secondary | ICD-10-CM

## 2013-04-01 DIAGNOSIS — I10 Essential (primary) hypertension: Secondary | ICD-10-CM

## 2013-04-01 DIAGNOSIS — E119 Type 2 diabetes mellitus without complications: Secondary | ICD-10-CM

## 2013-04-01 DIAGNOSIS — I679 Cerebrovascular disease, unspecified: Secondary | ICD-10-CM

## 2013-04-01 DIAGNOSIS — E039 Hypothyroidism, unspecified: Secondary | ICD-10-CM

## 2013-04-01 LAB — POCT GLYCOSYLATED HEMOGLOBIN (HGB A1C): Hemoglobin A1C: 6.1

## 2013-04-01 NOTE — Progress Notes (Signed)
  Subjective:    Patient ID: [name], male    DOB: September 14, 1946, 67 y.o.   MRN: [id number]  Diabetes He has type 2 diabetes mellitus. His disease course has been improving. Pertinent negatives for hypoglycemia include no confusion. Pertinent negatives for diabetes include no blurred vision and no chest pain. There are no hypoglycemic complications. Symptoms are improving. Risk factors for coronary artery disease include diabetes mellitus. His weight is stable. His breakfast blood glucose is taken between 7-8 am. His breakfast blood glucose range is generally 90-110 mg/dl.    Results for orders placed in visit on 04/01/13  POCT GLYCOSYLATED HEMOGLOBIN (HGB A1C)      Result Value Range   Hemoglobin A1C 6.1     Patient also concerned about his elevated triglycerides. The cardiologist and in falling this. I review the numbers. Numbers are quite high. Unfortunately he could not take the feeding of 5 rate. Patient was also confused and he took a "lower dose" on the generic Lipitor I let him know the 40 of Lipitor generic is equal to Pravachol 80.  No chest pain no shortness of breath.  Ongoing challenges with rheumatoid arthritis. We supplied him to narcotic pain medicine which keeps him going. No obvious side effects from this.   Review of Systems  Eyes: Negative for blurred vision.  Cardiovascular: Negative for chest pain.  Psychiatric/Behavioral: Negative for confusion.       Objective:   Physical Exam  Alert no acute distress. HEENT normal. Lungs clear. Heart regular rate and rhythm. Changes rheumatoid arthritis diffuse. Ankles no edema pulses adequate status post surgery on left side. Sensation somewhat diminished left distal foot.      Assessment & Plan:  Impression 1 type 2 diabetes good control. #2 hyperlipidemia discussed at length. #3 hypertension clinically stable. #4 hypothyroidism ongoing. #5 reflux stable. #6 status post amputation left small toe. Plan maintain same meds.  Recheck in several months. Diet exercise discussed. Will allow cardiologist and make further changes on lipid medicine. Rationale discussed.

## 2013-04-13 ENCOUNTER — Other Ambulatory Visit: Payer: Self-pay | Admitting: Family Medicine

## 2013-04-20 ENCOUNTER — Encounter: Payer: Self-pay | Admitting: *Deleted

## 2013-04-20 ENCOUNTER — Other Ambulatory Visit: Payer: Self-pay | Admitting: *Deleted

## 2013-04-20 DIAGNOSIS — E782 Mixed hyperlipidemia: Secondary | ICD-10-CM

## 2013-04-23 ENCOUNTER — Encounter: Payer: Self-pay | Admitting: Surgery

## 2013-04-26 ENCOUNTER — Encounter (INDEPENDENT_AMBULATORY_CARE_PROVIDER_SITE_OTHER): Payer: Medicare Other | Admitting: *Deleted

## 2013-04-26 ENCOUNTER — Encounter: Payer: Self-pay | Admitting: Surgery

## 2013-04-26 ENCOUNTER — Ambulatory Visit (INDEPENDENT_AMBULATORY_CARE_PROVIDER_SITE_OTHER): Payer: Medicare Other | Admitting: Surgery

## 2013-04-26 VITALS — BP 132/62 | HR 60 | Resp 14 | Ht 68.0 in | Wt 151.0 lb

## 2013-04-26 DIAGNOSIS — Z48812 Encounter for surgical aftercare following surgery on the circulatory system: Secondary | ICD-10-CM | POA: Insufficient documentation

## 2013-04-26 DIAGNOSIS — I739 Peripheral vascular disease, unspecified: Secondary | ICD-10-CM | POA: Insufficient documentation

## 2013-04-26 NOTE — Addendum Note (Signed)
Addended by: Adria Dill L on: 04/26/2013 02:12 PM   Modules accepted: Orders

## 2013-04-26 NOTE — Progress Notes (Signed)
[facility]   Patient name: [name] MRN: [id number] DOB: Nov 07, 1945 Sex: male     Chief Complaint  Patient presents with  . PVD    3 month follow up with vascular labs    HISTORY OF PRESENT ILLNESS: The patient is back today for his first postoperative followup. He is status post distal left superficial femoral to posterior tibial artery bypass graft with ipsilateral non-reversed, translocated saphenous vein. He also underwent a left fifth toe amputation with metatarsal head. This was performed on 01/28/2013. This was done in the setting of an ischemic left fifth toe. He states that the pain in his left fifth toe is completely resolved.   Past Medical History  Diagnosis Date  . 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 Hyperlipidemia   . Hypertension   . PVD (peripheral vascular disease)     Left iliac PCI/stent  . Cerebrovascular disease   . Tobacco abuse, in remission     Remote  . GERD (gastroesophageal reflux disease)   . Hypothyroid   . Allergic rhinitis   . Chronic lung disease     ?  Rheumatoid lung; ?  Adverse reaction to methotrexate  . Schatzki's ring   . Hx of Clostridium difficile infection     Recurrent; associated 40 pound weight loss  . Achalasia   . Hiatal hernia   . Diabetes mellitus     borderline no meds  . Atrial fibrillation   . Chronic back pain   . 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 Cervical spondylosis   . DDD (degenerative disc disease), lumbar   . Nephrolithiasis 2010    s/p lithotripsy  . Pneumonia     Past Surgical History  Procedure Laterality Date  . Coronary artery bypass graft      x6 In 1998  . Total knee arthroplasty      Right  . Ankle fusion      Left  . Wrist fusion      Right  . Shoulder surgery    . Colonoscopy  09/12/2011    Dr. Elly Modena hemorrhoids, normal colon and distal terminal ileum. Random bx negative. Stool for CDiff positive.   . Esophagogastroduodenoscopy  09/13/2011    Dr. Lyndle Herrlich plawues mid-esophagus KOH negative. Distal esophageal ring and ulcer. Mild gastritis. duodenal diverticulum. Savaory dilation 16mm.   Gaspar Bidding dilation  09/13/2011  . Esophageal manometry  09/30/2011    Procedure: ESOPHAGEAL MANOMETRY (EM);  Surgeon: Rob Bunting, MD;  Location: [facility];  Service: Endoscopy;  Laterality: N/A;  . Esophagogastroduodenoscopy  09/12/2011    Dr. Rinaldo Ratel rings s/p dilation  . Cardiac surgery    . Esophagomyotomy  11/12/11    [facility]- with DOR antireflux surgery  . Esophagogastroduodenoscopy  06/25/2012    Procedure: ESOPHAGOGASTRODUODENOSCOPY (EGD);  Surgeon: Corbin Ade, MD;  Location: [facility];  Service: Endoscopy;  Laterality: N/A;  7:30  . Savory dilation  06/25/2012    Procedure: SAVORY DILATION;  Surgeon: Corbin Ade, MD;  Location: [facility];  Service: Endoscopy;  Laterality: N/A;  Elease Hashimoto dilation  06/25/2012    Procedure: Elease Hashimoto DILATION;  Surgeon: Corbin Ade, MD;  Location: [facility];  Service: Endoscopy;  Laterality: N/A;  . Ankle fusion  09/01/2012    Procedure: ANKLE FUSION;  Surgeon: Valeria Batman, MD;  Location: [facility] OR;  Service: Orthopedics;  Laterality: Left;  Take down of angulated  tibial/fibula Fractures  . Femoral-tibial bypass graft Left 01/28/2013    Procedure: BYPASS GRAFT FEMORAL-TIBIAL ARTERY;  Surgeon: Nada Libman, MD;  Location: [facility] OR;  Service: Vascular;  Laterality: Left;  Ultrasound Guided  . Amputation Left 01/28/2013    Procedure: AMPUTATION LEFT 5TH TOE;  Surgeon: Nada Libman, MD;  Location: [facility] OR;  Service: Vascular;  Laterality: Left;    History   Social History  . Marital Status: Married    Spouse Name: N/A    Number of Children: 1  . Years of Education: N/A   Occupational History  . retired [employer]    Social History Main Topics  . Smoking status: Former Smoker -- 0.50 packs/day for 5 years    Types:  Cigarettes    Quit date: 09/23/1965  . Smokeless tobacco: Not on file  . Alcohol Use: No  . Drug Use: No  . Sexually Active: Not on file   Other Topics Concern  . Not on file   Social History Narrative  . No narrative on file    Family History  Problem Relation Age of Onset  . Stomach cancer Mother 64  . Cancer Mother   . Heart attack Father   . Heart disease Father   . Heart disease Brother   . Leukemia Brother   . Lung disease Son     infant  . Lung disease Daughter     infant    Allergies as of 04/26/2013 - Review Complete 04/26/2013  Allergen Reaction Noted  . Amoxicillin Hives and Other (See Comments) 08/24/2011  . Ciprofloxacin Other (See Comments) 01/21/2013  . Doxycycline Nausea And Vomiting 06/05/2011  . Levofloxacin Nausea And Vomiting 03/07/2008    Current Outpatient Prescriptions on File Prior to Visit  Medication Sig Dispense Refill  . ALPRAZolam (XANAX) 0.5 MG tablet Take 0.5 mg by mouth daily.      Marland Kitchen aspirin EC 81 MG tablet Take 81 mg by mouth daily.        Marland Kitchen atenolol (TENORMIN) 50 MG tablet TAKE 1 TABLET BY MOUTH EVERY DAY  30 tablet  3  . atorvastatin (LIPITOR) 40 MG tablet Take 1 tablet (40 mg total) by mouth daily.  90 tablet  1  . B Complex Vitamins (VITAMIN B COMPLEX PO) Take 1 tablet by mouth daily.      . chlorzoxazone (PARAFON) 500 MG tablet Take 500 mg by mouth 3 (three) times daily as needed for muscle spasms.      . cholecalciferol (VITAMIN D) 400 UNITS TABS Take 400 Units by mouth daily.       . folic acid (FOLVITE) 1 MG tablet Take 1 mg by mouth daily.        Marland Kitchen HYDROcodone-acetaminophen (NORCO) 10-325 MG per tablet Take 1 tablet by mouth every 4 (four) hours as needed for pain.      Marland Kitchen ibuprofen (ADVIL,MOTRIN) 800 MG tablet TAKE 1 TABLET BY MOUTH THREE TIMES DAILY  270 tablet  3  . levothyroxine (SYNTHROID, LEVOTHROID) 100 MCG tablet Take 1 tablet (100 mcg total) by mouth daily.  90 tablet  5  . lisinopril (PRINIVIL,ZESTRIL) 20 MG tablet  Take 20 mg by mouth daily.        . Melatonin 3 MG TABS Take 1 tablet by mouth at bedtime.      . Multiple Vitamin (MULTIVITAMIN) capsule Take 1 capsule by mouth daily.      . Omega-3 Fatty Acids (FISH OIL) 500 MG CAPS Take 1 capsule  by mouth daily.      Marland Kitchen omeprazole (PRILOSEC) 20 MG capsule Take 20 mg by mouth 2 (two) times daily.      . potassium gluconate 595 MG TABS Take 595 mg by mouth daily.      . predniSONE (DELTASONE) 10 MG tablet Take 1 tablet (10 mg total) by mouth daily.  21 tablet  1  . Teriparatide, Recombinant, (FORTEO) 600 MCG/2.4ML SOLN Inject 20 mcg into the skin daily.       No current facility-administered medications on file prior to visit.     REVIEW OF SYSTEMS: See history of present illness, otherwise all systems negative  PHYSICAL EXAMINATION:   Vital signs are BP 132/62  Pulse 60  Resp 14  Ht 5\' 8"  (1.727 m)  Wt 151 lb (68.493 kg)  BMI 22.96 kg/m2  SpO2 100% General: The patient appears their stated age. HEENT:  No gross abnormalities Pulmonary:  Non labored breathing  There are no major deformities. Neurologic: Pinpoint opening at the amputation site with clear drainage. No evidence of infection Skin: There are no ulcer or rashes noted. Psychiatric: The patient has normal affect. Cardiovascular: Pedal pulses are nonpalpable   Diagnostic Studies Duplex was ordered and reviewed. ABI is 1.2 and biphasic duplex shows widely patent bypass graftMusculoskeletal:  Assessment:  Status post left leg bypass graft for ulcer disease Plan:  The patient is doing very well. His bypass graft is widely patent. The amputation of his left fifth toe has nearly healed. I encouraged him to keep this area dry. He is currently wearing a Band-Aid which I think is adequate. I discussed the importance of not wearing shoes there were 2 tighter having anything that could damage the skin incision. I'm can have him come back in 6 months for followup with repeat ultrasound.  [name], M.D. [facility] [place] Office: [phone] Pager:  [phone]

## 2013-04-27 LAB — LIPID PANEL
Cholesterol: 254 mg/dL — ABNORMAL HIGH (ref 0–200)
VLDL: 67 mg/dL — ABNORMAL HIGH (ref 0–40)

## 2013-05-02 ENCOUNTER — Encounter: Payer: Self-pay | Admitting: Cardiology

## 2013-05-03 MED ORDER — ATORVASTATIN CALCIUM 80 MG PO TABS: 80.0000 mg | ORAL_TABLET | Freq: Every day | ORAL | Status: DC

## 2013-05-03 NOTE — Addendum Note (Signed)
Addended by: Thompson Grayer on: 05/03/2013 10:44 AM   Modules accepted: Orders

## 2013-05-19 ENCOUNTER — Other Ambulatory Visit (HOSPITAL_COMMUNITY): Payer: Self-pay | Admitting: Family Medicine

## 2013-05-20 ENCOUNTER — Other Ambulatory Visit: Payer: Self-pay | Admitting: Family Medicine

## 2013-05-20 NOTE — Telephone Encounter (Signed)
Tell pt I want his rheumatologist to prescribe this (we've spoke before about it)

## 2013-05-21 ENCOUNTER — Telehealth: Payer: Self-pay | Admitting: Family Medicine

## 2013-05-21 ENCOUNTER — Other Ambulatory Visit: Payer: Self-pay

## 2013-05-21 MED ORDER — PREDNISONE 10 MG PO TABS: 10.0000 mg | ORAL_TABLET | Freq: Every day | ORAL | Status: DC

## 2013-05-21 NOTE — Telephone Encounter (Signed)
Rx for prednisone sent to pharmacy. Patient was notified.

## 2013-05-21 NOTE — Telephone Encounter (Signed)
Chlorzoxazone was sent in to pharmacy on 05/19/13 with 5 refills. Just needs refill on the prednisone.

## 2013-05-21 NOTE — Telephone Encounter (Signed)
Refill prednisone times 5 please

## 2013-05-21 NOTE — Telephone Encounter (Signed)
Patient would like Rx prednisone and Chlorzoxazone   [facility]

## 2013-05-25 ENCOUNTER — Other Ambulatory Visit: Payer: Self-pay | Admitting: Family Medicine

## 2013-05-25 NOTE — Telephone Encounter (Signed)
Ok plus 2 ref if poss

## 2013-05-26 ENCOUNTER — Other Ambulatory Visit: Payer: Self-pay | Admitting: Orthopaedic Surgery

## 2013-05-26 DIAGNOSIS — M545 Low back pain: Secondary | ICD-10-CM

## 2013-05-26 DIAGNOSIS — M810 Age-related osteoporosis without current pathological fracture: Secondary | ICD-10-CM

## 2013-05-26 DIAGNOSIS — IMO0002 Reserved for concepts with insufficient information to code with codable children: Secondary | ICD-10-CM

## 2013-05-27 ENCOUNTER — Ambulatory Visit
Admission: RE | Admit: 2013-05-27 | Discharge: 2013-05-27 | Disposition: A | Discharge status: Home / Self Care | Payer: Medicare Other | Source: Ambulatory Visit | Attending: Orthopaedic Surgery | Admitting: Orthopaedic Surgery

## 2013-05-27 DIAGNOSIS — IMO0002 Reserved for concepts with insufficient information to code with codable children: Secondary | ICD-10-CM

## 2013-05-27 DIAGNOSIS — M810 Age-related osteoporosis without current pathological fracture: Secondary | ICD-10-CM

## 2013-05-27 DIAGNOSIS — M545 Low back pain: Secondary | ICD-10-CM

## 2013-05-29 ENCOUNTER — Other Ambulatory Visit: Payer: Medicare Other

## 2013-05-31 ENCOUNTER — Other Ambulatory Visit: Payer: Self-pay | Admitting: *Deleted

## 2013-05-31 ENCOUNTER — Encounter: Payer: Self-pay | Admitting: *Deleted

## 2013-05-31 DIAGNOSIS — E782 Mixed hyperlipidemia: Secondary | ICD-10-CM

## 2013-06-08 LAB — LIPID PANEL
Cholesterol: 243 mg/dL — ABNORMAL HIGH (ref 0–200)
HDL: 47 mg/dL (ref >39)
LDL Cholesterol: 116 mg/dL — ABNORMAL HIGH (ref 0–99)
Triglycerides: 400 mg/dL — ABNORMAL HIGH (ref <150)
VLDL: 80 mg/dL — ABNORMAL HIGH (ref 0–40)

## 2013-06-10 ENCOUNTER — Other Ambulatory Visit: Payer: Self-pay | Admitting: *Deleted

## 2013-06-10 ENCOUNTER — Other Ambulatory Visit: Payer: Self-pay | Admitting: Family Medicine

## 2013-06-10 MED ORDER — ALPRAZOLAM 0.5 MG PO TABS: 0.5000 mg | ORAL_TABLET | Freq: Every day | ORAL | Status: DC

## 2013-06-10 NOTE — Telephone Encounter (Signed)
Ok five ref 

## 2013-07-06 ENCOUNTER — Encounter: Payer: Self-pay | Admitting: Family Medicine

## 2013-07-06 ENCOUNTER — Ambulatory Visit (INDEPENDENT_AMBULATORY_CARE_PROVIDER_SITE_OTHER): Payer: Medicare Other | Admitting: Family Medicine

## 2013-07-06 VITALS — BP 114/70 | Ht 67.0 in | Wt 151.2 lb

## 2013-07-06 DIAGNOSIS — E119 Type 2 diabetes mellitus without complications: Secondary | ICD-10-CM

## 2013-07-06 LAB — POCT GLYCOSYLATED HEMOGLOBIN (HGB A1C): Hemoglobin A1C: 6.4

## 2013-07-06 MED ORDER — HYDROCODONE-ACETAMINOPHEN 10-325 MG PO TABS: ORAL_TABLET | ORAL | Status: DC

## 2013-07-06 MED ORDER — PREDNISONE 10 MG PO TABS: 10.0000 mg | ORAL_TABLET | Freq: Every day | ORAL | Status: DC

## 2013-07-06 NOTE — Progress Notes (Signed)
  Subjective:    Patient ID: [name], male    DOB: 1946-03-17, 67 y.o.   MRN: [id number]  HPI Patient is here today for diabetic check up. He states blood sugars have been WNL. Trying to cut down sugar in his diet.  Would like to get a refill on prednisone.  Patient unfortunately states he actually has to have prednisone results he cannot go on. His rheumatologist as recommended he not be on it. But he states he absolutely has to take it he recognizes there are significant risks to use it long term.  Compliant with his blood pressure medication. Blood pressures generally are good elsewhere. No concerns.   Results for orders placed in visit on 07/06/13  POCT GLYCOSYLATED HEMOGLOBIN (HGB A1C)      Result Value Range   Hemoglobin A1C 6.4       Review of Systems No headache no chest pain no back pain no abdominal pain ROS otherwise negative ongoing joint pain secondary to rheumatoid arthritis    Objective:   Physical Exam Alert mild malaise HEENT normal. Lungs clear. Heart regular in rhythm. Diffuse damage to joints secondary to rheumatoid arthritis ankles without edema.   Feet followed by specialist    Assessment & Plan:  Impression type 2 diabetes good control. #2 chronic pain discuss. #3 hypertension good control #4 reflux stable #5 prednisone use not ideal but I will maintain at patient's insistence plan hydrocodone refilled. Check every 90 days. Diet exercise discussed. Maintain same meds maintain ongoing visits a specialist. WSL

## 2013-07-08 ENCOUNTER — Ambulatory Visit: Payer: Medicare Other | Admitting: Cardiology

## 2013-07-09 ENCOUNTER — Other Ambulatory Visit: Payer: Self-pay

## 2013-07-09 MED ORDER — ATENOLOL 50 MG PO TABS: ORAL_TABLET | ORAL | Status: DC

## 2013-07-22 ENCOUNTER — Encounter: Payer: Self-pay | Admitting: Cardiology

## 2013-07-22 ENCOUNTER — Ambulatory Visit (INDEPENDENT_AMBULATORY_CARE_PROVIDER_SITE_OTHER): Payer: Medicare Other | Admitting: Cardiology

## 2013-07-22 VITALS — BP 134/79 | HR 67 | Ht 68.0 in | Wt 152.8 lb

## 2013-07-22 DIAGNOSIS — I25810 Atherosclerosis of coronary artery bypass graft(s) without angina pectoris: Secondary | ICD-10-CM

## 2013-07-22 DIAGNOSIS — E785 Hyperlipidemia, unspecified: Secondary | ICD-10-CM

## 2013-07-22 DIAGNOSIS — R0989 Other specified symptoms and signs involving the circulatory and respiratory systems: Secondary | ICD-10-CM

## 2013-07-22 MED ORDER — ROSUVASTATIN CALCIUM 20 MG PO TABS: 20.0000 mg | ORAL_TABLET | Freq: Every day | ORAL | Status: DC

## 2013-07-22 NOTE — Patient Instructions (Addendum)
Your physician recommends that you schedule a follow-up appointment in:  3 months You will receive a reminder letter two months in advance reminding you to call and schedule your appointment. If you don't receive this letter, please contact our office.  Your physician has recommended you make the following change in your medication:  1.Stop Pravastatin 2. Start Crestor 20 mg daily  Your physician has requested that you have a carotid duplex. This test is an ultrasound of the carotid arteries in your neck. It looks at blood flow through these arteries that supply the brain with blood. Allow one hour for this exam. There are no restrictions or special instructions.

## 2013-07-22 NOTE — Progress Notes (Signed)
Clinical Summary [name] is a 67 y.o.male seen today for follow up for the following problems. He is a former patient of Dr [name]  1. CAD - prior CABG 1998 at [facility] - last cath 10/2007 shows LM 95%, occluded LAD, LCX, and RCA. LIMA-LAD patent, SVG-OM patent, SVG to RCA occluded. Echo Jan 2012 LVEF 60-65%, grade II diastolic dysfunction  - denies any chest pain. Denies any significant SOB or DOE. No orthopnea, no PND, occas LE edema -compliant w/ meds: ASA, lisinopril, prava, atenolol  2. PAD - prior SFA to popliteal bypass with SVG, with amputation of the left fifth toe - also histor of left iliac stenting - followed by vascular, has appt in Feb 2015  3. Rheumatoid arthritis - he is on prednisone, followed by rheum  4. HL - reports lipitor in the past caused joint pain, switched to prava and has tolerated well - 05/2013: TC 242, TG 400, HDL 47 LDL 116  Past Medical History  Diagnosis Date  . 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 Hyperlipidemia   . Hypertension   . PVD (peripheral vascular disease)     Left iliac PCI/stent  . Cerebrovascular disease   . Tobacco abuse, in remission     Remote  . GERD (gastroesophageal reflux disease)   . Hypothyroid   . Allergic rhinitis   . Chronic lung disease     ?  Rheumatoid lung; ?  Adverse reaction to methotrexate  . Schatzki's ring   . Hx of Clostridium difficile infection     Recurrent; associated 40 pound weight loss  . Achalasia   . Hiatal hernia   . Diabetes mellitus     borderline no meds  . Atrial fibrillation   . Chronic back pain   . 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 Cervical spondylosis   . DDD (degenerative disc disease), lumbar   . Nephrolithiasis 2010    s/p lithotripsy  . Pneumonia      Allergies  Allergen Reactions  . Amoxicillin Hives and Other (See Comments)    Hallucinations   . Ciprofloxacin Other (See Comments)    C-diff  . Doxycycline  Nausea And Vomiting  . Levofloxacin Nausea And Vomiting     Current Outpatient Prescriptions  Medication Sig Dispense Refill  . ALPRAZolam (XANAX) 0.5 MG tablet Take 1 tablet (0.5 mg total) by mouth daily.  30 tablet  5  . aspirin EC 81 MG tablet Take 81 mg by mouth daily.        Marland Kitchen atenolol (TENORMIN) 50 MG tablet TAKE 1 TABLET BY MOUTH EVERY DAY  30 tablet  3  . atorvastatin (LIPITOR) 80 MG tablet Take 1 tablet (80 mg total) by mouth daily.  90 tablet  1  . B Complex Vitamins (VITAMIN B COMPLEX PO) Take 1 tablet by mouth daily.      . chlorzoxazone (PARAFON) 500 MG tablet TAKE 1 TABLET BY MOUTH THREE TIMES DAILY AS NEEDED FOR SPASMS  60 tablet  5  . cholecalciferol (VITAMIN D) 400 UNITS TABS Take 400 Units by mouth daily.       . folic acid (FOLVITE) 1 MG tablet Take 1 mg by mouth daily.        Marland Kitchen HYDROcodone-acetaminophen (NORCO) 10-325 MG per tablet One tab every 4 hrs prn  120 tablet  0  . HYDROcodone-acetaminophen (NORCO) 10-325 MG per tablet One tab every 4 hrs prn  120  tablet  0  . HYDROcodone-acetaminophen (NORCO) 10-325 MG per tablet One tab every 4 hrs prn  120 tablet  0  . ibuprofen (ADVIL,MOTRIN) 800 MG tablet TAKE 1 TABLET BY MOUTH THREE TIMES DAILY  270 tablet  3  . levothyroxine (SYNTHROID, LEVOTHROID) 100 MCG tablet Take 1 tablet (100 mcg total) by mouth daily.  90 tablet  5  . lisinopril (PRINIVIL,ZESTRIL) 20 MG tablet Take 20 mg by mouth daily.        . Melatonin 3 MG TABS Take 1 tablet by mouth at bedtime.      . Multiple Vitamin (MULTIVITAMIN) capsule Take 1 capsule by mouth daily.      . Omega-3 Fatty Acids (FISH OIL) 500 MG CAPS Take 1 capsule by mouth daily.      Marland Kitchen omeprazole (PRILOSEC) 20 MG capsule Take 20 mg by mouth 2 (two) times daily.      . potassium gluconate 595 MG TABS Take 595 mg by mouth daily.      . predniSONE (DELTASONE) 10 MG tablet Take 1 tablet (10 mg total) by mouth daily.  30 tablet  5  . Teriparatide, Recombinant, (FORTEO) 600 MCG/2.4ML SOLN  Inject 20 mcg into the skin daily.       No current facility-administered medications for this visit.     Past Surgical History  Procedure Laterality Date  . Coronary artery bypass graft      x6 In 1998  . Total knee arthroplasty      Right  . Ankle fusion      Left  . Wrist fusion      Right  . Shoulder surgery    . Colonoscopy  09/12/2011    Dr. Elly Modena hemorrhoids, normal colon and distal terminal ileum. Random bx negative. Stool for CDiff positive.  . Esophagogastroduodenoscopy  09/13/2011    Dr. Lyndle Herrlich plawues mid-esophagus KOH negative. Distal esophageal ring and ulcer. Mild gastritis. duodenal diverticulum. Savaory dilation 16mm.   Gaspar Bidding dilation  09/13/2011  . Esophageal manometry  09/30/2011    Procedure: ESOPHAGEAL MANOMETRY (EM);  Surgeon: Rob Bunting, MD;  Location: [facility];  Service: Endoscopy;  Laterality: N/A;  . Esophagogastroduodenoscopy  09/12/2011    Dr. Rinaldo Ratel rings s/p dilation  . Cardiac surgery    . Esophagomyotomy  11/12/11    [facility]- with DOR antireflux surgery  . Esophagogastroduodenoscopy  06/25/2012    Procedure: ESOPHAGOGASTRODUODENOSCOPY (EGD);  Surgeon: Corbin Ade, MD;  Location: [facility];  Service: Endoscopy;  Laterality: N/A;  7:30  . Savory dilation  06/25/2012    Procedure: SAVORY DILATION;  Surgeon: Corbin Ade, MD;  Location: [facility];  Service: Endoscopy;  Laterality: N/A;  Elease Hashimoto dilation  06/25/2012    Procedure: Elease Hashimoto DILATION;  Surgeon: Corbin Ade, MD;  Location: [facility];  Service: Endoscopy;  Laterality: N/A;  . Ankle fusion  09/01/2012    Procedure: ANKLE FUSION;  Surgeon: Valeria Batman, MD;  Location: [facility] OR;  Service: Orthopedics;  Laterality: Left;  Take down of angulated tibial/fibula Fractures  . Femoral-tibial bypass graft Left 01/28/2013    Procedure: BYPASS GRAFT FEMORAL-TIBIAL ARTERY;  Surgeon: Nada Libman, MD;  Location[facility] OR;  Service: Vascular;  Laterality:  Left;  Ultrasound Guided  . Amputation Left 01/28/2013    Procedure: AMPUTATION LEFT 5TH TOE;  Surgeon: Nada Libman, MD;  Location: [facility] OR;  Service: Vascular;  Laterality: Left;     Allergies  Allergen Reactions  . Amoxicillin  Hives and Other (See Comments)    Hallucinations   . Ciprofloxacin Other (See Comments)    C-diff  . Doxycycline Nausea And Vomiting  . Levofloxacin Nausea And Vomiting      Family History  Problem Relation Age of Onset  . Stomach cancer Mother 47  . Cancer Mother   . Heart attack Father   . Heart disease Father   . Heart disease Brother   . Leukemia Brother   . Lung disease Son     infant  . Lung disease Daughter     infant     Social History Mr. Thilges reports that he quit smoking about 47 years ago. His smoking use included Cigarettes. He has a 2.5 pack-year smoking history. He does not have any smokeless tobacco history on file. Mr. Petter reports that he does not drink alcohol.   Review of Systems CONSTITUTIONAL: No weight loss, fever, chills, weakness or fatigue.  HEENT: Eyes: No visual loss, blurred vision, double vision or yellow sclerae.No hearing loss, sneezing, congestion, runny nose or sore throat.  SKIN: No rash or itching.  CARDIOVASCULAR: per HPI RESPIRATORY: per HPI GASTROINTESTINAL: No anorexia, nausea, vomiting or diarrhea. No abdominal pain or blood.  GENITOURINARY: No burning on urination, no polyuria NEUROLOGICAL: No headache, dizziness, syncope, paralysis, ataxia, numbness or tingling in the extremities. No change in bowel or bladder control.  MUSCULOSKELETAL: No muscle, back pain, joint pain or stiffness.  LYMPHATICS: No enlarged nodes. No history of splenectomy.  PSYCHIATRIC: No history of depression or anxiety.  ENDOCRINOLOGIC: No reports of sweating, cold or heat intolerance. No polyuria or polydipsia.  Marland Kitchen   Physical Examination p 67 bp 134/79 Wt 152 lbs BMI 23 Gen: resting comfortably, no acute distress HEENT:  no scleral icterus, pupils equal round and reactive, no palptable cervical adenopathy,  CV: RRR, no m/r/g, no JVD, bilateral mild carotid bruits Resp: Clear to auscultation bilaterally GI: abdomen is soft, non-tender, non-distended, normal bowel sounds, no hepatosplenomegaly MSK: extremities are warm, no edema.  Skin: warm, no rash Neuro:  no focal deficits Psych: appropriate affect   Diagnostic Studies 10/2007 Cath HEMODYNAMIC RESULTS: Left ventricle 139/16 mmHg. Aorta 136/67 mmHg.  ANGIOGRAPHIC FINDINGS:  1. Left main coronary artery is severely diseased to approximately 95%  in diffuse fashion. This has progressed compared to the previous  angiogram. This vessel gives rise to the left anterior descending,  a very small ramus intermedius, and circumflex vessels.  2. The left anterior descending is occluded proximally.  3. The circumflex coronary artery is occluded proximally.  4. The right coronary artery is occluded proximally just after the  takeoff of a right ventricular marginal Keary Waterson. There are some  right to right bridging collaterals noted filling the right  coronary artery nearly down to the distal portion.  5. The saphenous vein graft to the first and second obtuse marginals  is widely patent. There is an area of 30-40% stenosis within the  proximal vessel although not clearly flow-limiting. The  anastomotic sites look to be intact.  6. There is known occlusion of the saphenous vein graft of the right  coronary system.  7. The LIMA to the left anterior descending and diagonal is widely  patent. Anastomotic sites are intact. Of note, the distal left  anterior descending is diffusely diseased, and there is an apical  90% stenosis noted. The diagonal Adylynn Hertenstein is also diffusely diseased  and relatively small.  8. There is some left-to-right collateralization of the distal right  coronary  artery system, although not well-developed.  Ventriculography is performed in the RAO  projection and reveals an  ejection fraction of approximately 60% with no significant wall motion  abnormality and no mitral regurgitation.  DIAGNOSES:  1. Severe native coronary artery disease as outlined. There has been  progression in left main disease, although continued occlusion  proximally of the left anterior descending and circumflex vessels.  The distal left anterior descending is diffusely diseased, and  there is some left-to-right collateralization of the occluded right  coronary artery although not to a large degree.  2. Patent left internal mammary artery to left anterior descending and  diagonal, patent saphenous vein graft to the first and second  obtuse marginal with 30-40% proximal vein graft stenosis (not  clearly flow-limiting), and known occlusion of the saphenous vein  graft to posterior descending and right coronary artery.  3. Left ventricular ejection fraction of approximately 60% with no  large focal wall motion abnormality and a left ventricular end-  diastolic pressure of 16 mmHg. No significant mitral regurgitation  is noted.  DISCUSSION: No obvious revascularization options noted at this time.  Suspect that angina may well be related to progression in the patient's  native disease rather than new hemodynamically significant occlusions  within his remaining 2 bypass grafts which are widely patent. There is  30-40% stenosis within the saphenous vein graft of the obtuse marginal  system, although this does not appear to be flow-limiting. At this  point, medical therapy seems to be the best option. I discussed this  with the patient, his family, and with Dr. [name].   Echo 09/2010: LVEF 60-65%, grade II diastolic dysfunction  04/2013 ABI: right 1.39 left 1.46   Assessment and Plan  1. CAD - no current symptoms - continue risk factor modification, continue current meds  2. PAD - continue follow up with vascular, has appt in Feb 2015  3. Carotid  bruits - prior US 2012 showed mild plaque, will repeat study. No current symptoms  4. Hyperlipidemia - not at goal, given hx of CAD goal LDL <70.  - reports myalgias on lipitor before. Will try crestor and see how he tolerates. Pending response, he also may need fenofibrate started at follow up appointments.        Antoine Poche, M.D., F.A.C.C.

## 2013-07-26 ENCOUNTER — Telehealth: Payer: Self-pay | Admitting: *Deleted

## 2013-07-26 NOTE — Telephone Encounter (Signed)
He was previously on pravastatin, he can just continue on that.  Dina Rich MD

## 2013-07-26 NOTE — Telephone Encounter (Signed)
Spoke to pt to advise results/instructions. Pt understood. Pt advised he does NOT need any refills on the pravastatin

## 2013-07-26 NOTE — Telephone Encounter (Signed)
Pt was switched to crestor at last visit and he can not afford it.

## 2013-07-26 NOTE — Telephone Encounter (Signed)
Please advise (please note if applicable [facility] has mevacor 10 or 20mg  on their 4 dollar list)

## 2013-07-27 ENCOUNTER — Ambulatory Visit (HOSPITAL_COMMUNITY)
Admission: RE | Admit: 2013-07-27 | Discharge: 2013-07-27 | Disposition: A | Discharge status: Home / Self Care | Payer: Medicare Other | Source: Ambulatory Visit | Attending: Cardiology | Admitting: Cardiology

## 2013-07-27 DIAGNOSIS — E785 Hyperlipidemia, unspecified: Secondary | ICD-10-CM | POA: Insufficient documentation

## 2013-07-27 DIAGNOSIS — I6529 Occlusion and stenosis of unspecified carotid artery: Secondary | ICD-10-CM | POA: Insufficient documentation

## 2013-07-27 DIAGNOSIS — I10 Essential (primary) hypertension: Secondary | ICD-10-CM | POA: Insufficient documentation

## 2013-07-27 DIAGNOSIS — Z87891 Personal history of nicotine dependence: Secondary | ICD-10-CM | POA: Insufficient documentation

## 2013-07-27 DIAGNOSIS — I658 Occlusion and stenosis of other precerebral arteries: Secondary | ICD-10-CM | POA: Insufficient documentation

## 2013-07-27 DIAGNOSIS — I4891 Unspecified atrial fibrillation: Secondary | ICD-10-CM | POA: Insufficient documentation

## 2013-07-27 DIAGNOSIS — I739 Peripheral vascular disease, unspecified: Secondary | ICD-10-CM | POA: Insufficient documentation

## 2013-07-27 DIAGNOSIS — Z951 Presence of aortocoronary bypass graft: Secondary | ICD-10-CM | POA: Insufficient documentation

## 2013-07-27 DIAGNOSIS — E119 Type 2 diabetes mellitus without complications: Secondary | ICD-10-CM | POA: Insufficient documentation

## 2013-07-27 DIAGNOSIS — R0989 Other specified symptoms and signs involving the circulatory and respiratory systems: Secondary | ICD-10-CM | POA: Insufficient documentation

## 2013-08-10 ENCOUNTER — Encounter: Payer: Self-pay | Admitting: Family Medicine

## 2013-08-10 ENCOUNTER — Ambulatory Visit (INDEPENDENT_AMBULATORY_CARE_PROVIDER_SITE_OTHER): Payer: Medicare Other | Admitting: Family Medicine

## 2013-08-10 VITALS — BP 118/82 | Temp 98.3°F | Wt 150.4 lb

## 2013-08-10 DIAGNOSIS — R300 Dysuria: Secondary | ICD-10-CM

## 2013-08-10 DIAGNOSIS — N400 Enlarged prostate without lower urinary tract symptoms: Secondary | ICD-10-CM

## 2013-08-10 LAB — POCT URINALYSIS DIPSTICK
Spec Grav, UA: 1.015
pH, UA: 7

## 2013-08-10 MED ORDER — TAMSULOSIN HCL 0.4 MG PO CAPS: 0.4000 mg | ORAL_CAPSULE | Freq: Every day | ORAL | Status: DC

## 2013-08-10 NOTE — Progress Notes (Signed)
  Subjective:    Patient ID: [name], male    DOB: August 22, 1946, 67 y.o.   MRN: [id number]  HPI Patient arrives with complaint of painful urination for months-feels like he has to go but can't get it out and having problems holding his urine for months.  Some discomfort with urinating.   No nocturia,  Urine stream not as good  Review of Systems No fever no chills no abdominal pain no rash ROS otherwise negative    Objective:   Physical Exam  Alert no apparent distress. Lungs clear. Heart regular in rhythm. H&T normal. Prostate gland somewhat diffusely enlarged no nodules no abnormalities  Urinalysis within normal limits.      Assessment & Plan:  Impression progressive outlet syndrome with prostate hypertrophy discussed plan add Flomax 0.4 daily. Symptomatic care discussed. Followup regular appointment. WSL

## 2013-08-15 DIAGNOSIS — N400 Enlarged prostate without lower urinary tract symptoms: Secondary | ICD-10-CM | POA: Insufficient documentation

## 2013-08-16 ENCOUNTER — Other Ambulatory Visit: Payer: Self-pay | Admitting: Family Medicine

## 2013-09-04 ENCOUNTER — Other Ambulatory Visit: Payer: Self-pay | Admitting: Family Medicine

## 2013-09-30 ENCOUNTER — Telehealth: Payer: Self-pay | Admitting: Family Medicine

## 2013-09-30 ENCOUNTER — Encounter (HOSPITAL_COMMUNITY): Payer: Self-pay | Admitting: Emergency Medicine

## 2013-09-30 ENCOUNTER — Ambulatory Visit: Payer: No Typology Code available for payment source | Admitting: Family Medicine

## 2013-09-30 ENCOUNTER — Emergency Department (HOSPITAL_COMMUNITY)
Admission: EM | Admit: 2013-09-30 | Discharge: 2013-09-30 | Disposition: A | Discharge status: Home / Self Care | Payer: Medicare Other | Attending: Emergency Medicine | Admitting: Emergency Medicine

## 2013-09-30 DIAGNOSIS — J449 Chronic obstructive pulmonary disease, unspecified: Secondary | ICD-10-CM | POA: Insufficient documentation

## 2013-09-30 DIAGNOSIS — Z791 Long term (current) use of non-steroidal anti-inflammatories (NSAID): Secondary | ICD-10-CM | POA: Insufficient documentation

## 2013-09-30 DIAGNOSIS — M549 Dorsalgia, unspecified: Secondary | ICD-10-CM | POA: Insufficient documentation

## 2013-09-30 DIAGNOSIS — Z8701 Personal history of pneumonia (recurrent): Secondary | ICD-10-CM | POA: Insufficient documentation

## 2013-09-30 DIAGNOSIS — E785 Hyperlipidemia, unspecified: Secondary | ICD-10-CM | POA: Insufficient documentation

## 2013-09-30 DIAGNOSIS — Z79899 Other long term (current) drug therapy: Secondary | ICD-10-CM | POA: Insufficient documentation

## 2013-09-30 DIAGNOSIS — Z87442 Personal history of urinary calculi: Secondary | ICD-10-CM | POA: Insufficient documentation

## 2013-09-30 DIAGNOSIS — Z87891 Personal history of nicotine dependence: Secondary | ICD-10-CM | POA: Insufficient documentation

## 2013-09-30 DIAGNOSIS — K089 Disorder of teeth and supporting structures, unspecified: Secondary | ICD-10-CM | POA: Insufficient documentation

## 2013-09-30 DIAGNOSIS — Z8619 Personal history of other infectious and parasitic diseases: Secondary | ICD-10-CM | POA: Insufficient documentation

## 2013-09-30 DIAGNOSIS — Z8673 Personal history of transient ischemic attack (TIA), and cerebral infarction without residual deficits: Secondary | ICD-10-CM | POA: Insufficient documentation

## 2013-09-30 DIAGNOSIS — R197 Diarrhea, unspecified: Secondary | ICD-10-CM

## 2013-09-30 DIAGNOSIS — IMO0002 Reserved for concepts with insufficient information to code with codable children: Secondary | ICD-10-CM | POA: Insufficient documentation

## 2013-09-30 DIAGNOSIS — Z951 Presence of aortocoronary bypass graft: Secondary | ICD-10-CM | POA: Insufficient documentation

## 2013-09-30 DIAGNOSIS — E039 Hypothyroidism, unspecified: Secondary | ICD-10-CM | POA: Insufficient documentation

## 2013-09-30 DIAGNOSIS — I10 Essential (primary) hypertension: Secondary | ICD-10-CM | POA: Insufficient documentation

## 2013-09-30 DIAGNOSIS — J4489 Other specified chronic obstructive pulmonary disease: Secondary | ICD-10-CM | POA: Insufficient documentation

## 2013-09-30 DIAGNOSIS — M069 Rheumatoid arthritis, unspecified: Secondary | ICD-10-CM | POA: Insufficient documentation

## 2013-09-30 DIAGNOSIS — R110 Nausea: Secondary | ICD-10-CM | POA: Insufficient documentation

## 2013-09-30 DIAGNOSIS — Z7982 Long term (current) use of aspirin: Secondary | ICD-10-CM | POA: Insufficient documentation

## 2013-09-30 DIAGNOSIS — K219 Gastro-esophageal reflux disease without esophagitis: Secondary | ICD-10-CM | POA: Insufficient documentation

## 2013-09-30 DIAGNOSIS — G8929 Other chronic pain: Secondary | ICD-10-CM | POA: Insufficient documentation

## 2013-09-30 DIAGNOSIS — Z9889 Other specified postprocedural states: Secondary | ICD-10-CM | POA: Insufficient documentation

## 2013-09-30 LAB — CBC WITH DIFFERENTIAL/PLATELET
BASOS PCT: 0 % (ref 0–1)
Basophils Absolute: 0 10*3/uL (ref 0.0–0.1)
Eosinophils Absolute: 0.2 10*3/uL (ref 0.0–0.7)
Eosinophils Relative: 2 % (ref 0–5)
HEMATOCRIT: 37.3 % — AB (ref 39.0–52.0)
HEMOGLOBIN: 12.2 g/dL — AB (ref 13.0–17.0)
Lymphocytes Relative: 4 % — ABNORMAL LOW (ref 12–46)
Lymphs Abs: 0.5 10*3/uL — ABNORMAL LOW (ref 0.7–4.0)
MCH: 31.4 pg (ref 26.0–34.0)
MCHC: 32.7 g/dL (ref 30.0–36.0)
MCV: 95.9 fL (ref 78.0–100.0)
MONO ABS: 0.4 10*3/uL (ref 0.1–1.0)
MONOS PCT: 3 % (ref 3–12)
Neutro Abs: 11.9 10*3/uL — ABNORMAL HIGH (ref 1.7–7.7)
Neutrophils Relative %: 91 % — ABNORMAL HIGH (ref 43–77)
Platelets: 135 10*3/uL — ABNORMAL LOW (ref 150–400)
RBC: 3.89 MIL/uL — ABNORMAL LOW (ref 4.22–5.81)
RDW: 15.6 % — ABNORMAL HIGH (ref 11.5–15.5)
WBC: 13.1 10*3/uL — ABNORMAL HIGH (ref 4.0–10.5)

## 2013-09-30 LAB — BASIC METABOLIC PANEL
BUN: 19 mg/dL (ref 6–23)
CO2: 21 mEq/L (ref 19–32)
CREATININE: 1.02 mg/dL (ref 0.50–1.35)
Calcium: 9.3 mg/dL (ref 8.4–10.5)
Chloride: 108 mEq/L (ref 96–112)
GFR, EST AFRICAN AMERICAN: 85 mL/min — AB (ref >90)
GFR, EST NON AFRICAN AMERICAN: 73 mL/min — AB (ref >90)
Glucose, Bld: 111 mg/dL — ABNORMAL HIGH (ref 70–99)
Potassium: 4.3 mEq/L (ref 3.7–5.3)
Sodium: 143 mEq/L (ref 137–147)

## 2013-09-30 MED ORDER — DIPHENOXYLATE-ATROPINE 2.5-0.025 MG PO TABS: 1.0000 | ORAL_TABLET | Freq: Four times a day (QID) | ORAL | Status: DC | PRN

## 2013-09-30 MED ORDER — ONDANSETRON HCL 4 MG/2ML IJ SOLN
4.0000 mg | Freq: Once | INTRAMUSCULAR | Status: AC
  Administered 2013-09-30: 4 mg via INTRAVENOUS
  Filled 2013-09-30: qty 2

## 2013-09-30 MED ORDER — MORPHINE SULFATE 4 MG/ML IJ SOLN
4.0000 mg | INTRAMUSCULAR | Status: DC | PRN
  Administered 2013-09-30: 4 mg via INTRAVENOUS
  Filled 2013-09-30: qty 1

## 2013-09-30 MED ORDER — ONDANSETRON 4 MG PO TBDP: 4.0000 mg | ORAL_TABLET | Freq: Four times a day (QID) | ORAL | Status: DC | PRN

## 2013-09-30 MED ORDER — SODIUM CHLORIDE 0.9 % IV BOLUS (SEPSIS)
1000.0000 mL | Freq: Once | INTRAVENOUS | Status: AC
  Administered 2013-09-30: 1000 mL via INTRAVENOUS

## 2013-09-30 MED ORDER — MORPHINE SULFATE 4 MG/ML IJ SOLN: 4.0000 mg | INTRAMUSCULAR | Status: DC | PRN

## 2013-09-30 MED ORDER — DIPHENOXYLATE-ATROPINE 2.5-0.025 MG PO TABS
2.0000 | ORAL_TABLET | Freq: Once | ORAL | Status: AC
  Administered 2013-09-30: 2 via ORAL
  Filled 2013-09-30: qty 2

## 2013-09-30 NOTE — Discharge Instructions (Signed)
Diarrhea Diarrhea is frequent loose and watery bowel movements. It can cause you to feel weak and dehydrated. Dehydration can cause you to become tired and thirsty, have a dry mouth, and have decreased urination that often is dark yellow. Diarrhea is a sign of another problem, most often an infection that will not last long. In most cases, diarrhea typically lasts 2 3 days. However, it can last longer if it is a sign of something more serious. It is important to treat your diarrhea as directed by your caregive to lessen or prevent future episodes of diarrhea. CAUSES  Some common causes include:  Gastrointestinal infections caused by viruses, bacteria, or parasites.  Food poisoning or food allergies.  Certain medicines, such as antibiotics, chemotherapy, and laxatives.  Artificial sweeteners and fructose.  Digestive disorders. HOME CARE INSTRUCTIONS  Ensure adequate fluid intake (hydration): have 1 cup (8 oz) of fluid for each diarrhea episode. Avoid fluids that contain simple sugars or sports drinks, fruit juices, whole milk products, and sodas. Your urine should be clear or pale yellow if you are drinking enough fluids. Hydrate with an oral rehydration solution that you can purchase at pharmacies, retail stores, and online. You can prepare an oral rehydration solution at home by mixing the following ingredients together:    tsp table salt.   tsp baking soda.   tsp salt substitute containing potassium chloride.  1  tablespoons sugar.  1 L (34 oz) of water.  Certain foods and beverages may increase the speed at which food moves through the gastrointestinal (GI) tract. These foods and beverages should be avoided and include:  Caffeinated and alcoholic beverages.  High-fiber foods, such as raw fruits and vegetables, nuts, seeds, and whole grain breads and cereals.  Foods and beverages sweetened with sugar alcohols, such as xylitol, sorbitol, and mannitol.  Some foods may be well  tolerated and may help thicken stool including:  Starchy foods, such as rice, toast, pasta, low-sugar cereal, oatmeal, grits, baked potatoes, crackers, and bagels.  Bananas.  Applesauce.  Add probiotic-rich foods to help increase healthy bacteria in the GI tract, such as yogurt and fermented milk products.  Wash your hands well after each diarrhea episode.  Only take over-the-counter or prescription medicines as directed by your caregiver.  Take a warm bath to relieve any burning or pain from frequent diarrhea episodes. SEEK IMMEDIATE MEDICAL CARE IF:   You are unable to keep fluids down.  You have persistent vomiting.  You have blood in your stool, or your stools are black and tarry.  You do not urinate in 6 8 hours, or there is only a small amount of very dark urine.  You have abdominal pain that increases or localizes.  You have weakness, dizziness, confusion, or lightheadedness.  You have a severe headache.  Your diarrhea gets worse or does not get better.  You have a fever or persistent symptoms for more than 2 3 days.  You have a fever and your symptoms suddenly get worse. MAKE SURE YOU:   Understand these instructions.  Will watch your condition.  Will get help right away if you are not doing well or get worse. Document Released: 08/30/2002 Document Revised: 08/26/2012 Document Reviewed: 05/17/2012 [employer] Patient Information 2014 [employer].  

## 2013-09-30 NOTE — Telephone Encounter (Signed)
Pt cancelled his appt for today due to vomiting an diarrhea starting last night, no fever, concerned its Cdiff, may go to hospital later if worsens   Would like something for both of these called into [facility] for delivery please

## 2013-09-30 NOTE — ED Notes (Signed)
Pt co diarrhea x1 day, brought in by EMS, denies abdominal pain. Pt states he went to dentist and received amoxicillin, usually gets diarrhea from anti-biotics.

## 2013-09-30 NOTE — ED Notes (Signed)
Pt alert & oriented x4, stable gait. Patient given discharge instructions, paperwork & prescription(s). Patient  instructed to stop at the registration desk to finish any additional paperwork. Patient verbalized understanding. Pt left department w/ no further questions. 

## 2013-09-30 NOTE — Telephone Encounter (Signed)
With sudden onset likely viral and not c diff. zofran 4 mg odt numb 28 one sl q 6 prn nausea, use otc immodium--but sparingly

## 2013-09-30 NOTE — ED Provider Notes (Signed)
CSN: [id number]     Arrival date & time 09/30/13  1003 History  This chart was scribed for Rolland Porter, MD by Quintella Reichert, ED scribe.  This patient was seen in room APA06/APA06 and the patient's care was started at 11:06 AM.   Chief Complaint  Patient presents with  . Diarrhea    The history is provided by the patient. No language interpreter was used.    HPI Comments: [name] is a 68 y.o. male who presents to the Emergency Department complaining of one day of persistent diarrhea.  Pt was seen by his dentist yesterday to have bone fragments removed subsequent to a recent excision, and he was placed on amoxicillin.  He states he was in his usual state of health until that evening when he took his first dose and 3 hours later developed diarrhea.  It has been occurring about every hour since then.  Pt also complains of nausea but denies vomiting.  He denies blood in stool.  He states he feels generally weak as well.  He denies abdominal pain or generalized body aches.  Pt states he has had diarrhea from antibiotics in the past.  He has also been hospitalized a few times with c. diff.  Pt also complains of moderate pain to his right shoulder that began 2 days ago.   Past Medical History  Diagnosis Date  . 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 Hyperlipidemia   . Hypertension   . PVD (peripheral vascular disease)     Left iliac PCI/stent  . Cerebrovascular disease   . Tobacco abuse, in remission     Remote  . GERD (gastroesophageal reflux disease)   . Hypothyroid   . Allergic rhinitis   . Chronic lung disease     ?  Rheumatoid lung; ?  Adverse reaction to methotrexate  . Schatzki's ring   . Hx of Clostridium difficile infection     Recurrent; associated 40 pound weight loss  . Achalasia   . Hiatal hernia   . Diabetes mellitus     borderline no meds  . Atrial fibrillation   . Chronic back pain   . Rheumatoid arthritis(714.0)     with nephritis   . Cervical spondylosis   . DDD (degenerative disc disease), lumbar   . Nephrolithiasis 2010    s/p lithotripsy  . Pneumonia     Past Surgical History  Procedure Laterality Date  . Coronary artery bypass graft      x6 In 1998  . Total knee arthroplasty      Right  . Ankle fusion      Left  . Wrist fusion      Right  . Shoulder surgery    . Colonoscopy  09/12/2011    Dr. Elly Modena hemorrhoids, normal colon and distal terminal ileum. Random bx negative. Stool for CDiff positive.  . Esophagogastroduodenoscopy  09/13/2011    Dr. Lyndle Herrlich plawues mid-esophagus KOH negative. Distal esophageal ring and ulcer. Mild gastritis. duodenal diverticulum. Savaory dilation 45mm.   Gaspar Bidding dilation  09/13/2011  . Esophageal manometry  09/30/2011    Procedure: ESOPHAGEAL MANOMETRY (EM);  Surgeon: Rob Bunting, MD;  Location: [facility];  Service: Endoscopy;  Laterality: N/A;  . Esophagogastroduodenoscopy  09/12/2011    Dr. Rinaldo Ratel rings s/p dilation  . Cardiac surgery    . Esophagomyotomy  11/12/11    [facility]- with DOR antireflux surgery  . Esophagogastroduodenoscopy  06/25/2012    Procedure: ESOPHAGOGASTRODUODENOSCOPY (EGD);  Surgeon: Corbin Ade, MD;  [facility];  Service: Endoscopy;  Laterality: N/A;  7:30  . Savory dilation  06/25/2012    Procedure: SAVORY DILATION;  Surgeon: Corbin Ade, MD;  [facility];  Service: Endoscopy;  Laterality: N/A;  Elease Hashimoto dilation  06/25/2012    Procedure: Elease Hashimoto DILATION;  Surgeon: Corbin Ade, MD;  [facility];  Service: Endoscopy;  Laterality: N/A;  . Ankle fusion  09/01/2012    Procedure: ANKLE FUSION;  Surgeon: Valeria Batman, MD;  Location: [facility] OR;  Service: Orthopedics;  Laterality: Left;  Take down of angulated tibial/fibula Fractures  . Femoral-tibial bypass graft Left 01/28/2013    Procedure: BYPASS GRAFT FEMORAL-TIBIAL ARTERY;  Surgeon: Nada Libman, MD;  Location: [facility] OR;   Service: Vascular;  Laterality: Left;  Ultrasound Guided  . Amputation Left 01/28/2013    Procedure: AMPUTATION LEFT 5TH TOE;  Surgeon: Nada Libman, MD;  Location: [facility] OR;  Service: Vascular;  Laterality: Left;    Family History  Problem Relation Age of Onset  . Stomach cancer Mother 65  . Cancer Mother   . Heart attack Father   . Heart disease Father   . Heart disease Brother   . Leukemia Brother   . Lung disease Son     infant  . Lung disease Daughter     infant    History  Substance Use Topics  . Smoking status: Former Smoker -- 0.50 packs/day for 5 years    Types: Cigarettes    Quit date: 09/23/1965  . Smokeless tobacco: Not on file  . Alcohol Use: No     Review of Systems  Constitutional: Negative for fever, chills, diaphoresis, appetite change and fatigue.  HENT: Positive for dental problem (due to recent dental surgery). Negative for mouth sores, sore throat and trouble swallowing.   Eyes: Negative for visual disturbance.  Respiratory: Negative for cough, chest tightness, shortness of breath and wheezing.   Cardiovascular: Negative for chest pain.  Gastrointestinal: Positive for nausea and diarrhea. Negative for vomiting, abdominal pain, blood in stool and abdominal distention.  Endocrine: Negative for polydipsia, polyphagia and polyuria.  Genitourinary: Negative for dysuria, frequency and hematuria.  Musculoskeletal: Positive for back pain (chronic, unchanged). Negative for gait problem and neck pain.  Skin: Negative for color change, pallor and rash.  Neurological: Negative for dizziness, syncope, light-headedness and headaches.  Hematological: Does not bruise/bleed easily.  Psychiatric/Behavioral: Negative for behavioral problems and confusion.     Allergies  Amoxicillin; Ciprofloxacin; Doxycycline; and Levofloxacin  Home Medications   Current Outpatient Rx  Name  Route  Sig  Dispense  Refill  . ALPRAZolam (XANAX) 0.5 MG tablet   Oral   Take 1 tablet  (0.5 mg total) by mouth daily.   30 tablet   5   . aspirin EC 81 MG tablet   Oral   Take 81 mg by mouth daily.           Marland Kitchen atenolol (TENORMIN) 25 MG tablet   Oral   Take 25 mg by mouth daily.         Marland Kitchen atorvastatin (LIPITOR) 80 MG tablet   Oral   Take 80 mg by mouth daily.         . B Complex Vitamins (VITAMIN B COMPLEX PO)   Oral   Take 1 tablet by mouth daily.         . cholecalciferol (VITAMIN D) 400 UNITS TABS  Oral   Take 400 Units by mouth daily.          . folic acid (FOLVITE) 1 MG tablet   Oral   Take 1 mg by mouth daily.           Marland Kitchen. HYDROcodone-acetaminophen (NORCO) 10-325 MG per tablet   Oral   Take 1 tablet by mouth 4 (four) times daily.         Marland Kitchen. ibuprofen (ADVIL,MOTRIN) 800 MG tablet   Oral   Take 800 mg by mouth 3 (three) times daily.         Marland Kitchen. leflunomide (ARAVA) 20 MG tablet   Oral   Take 20 mg by mouth daily.         Marland Kitchen. levothyroxine (SYNTHROID, LEVOTHROID) 100 MCG tablet   Oral   Take 1 tablet (100 mcg total) by mouth daily.   90 tablet   5   . lisinopril (PRINIVIL,ZESTRIL) 20 MG tablet      TAKE 1 TABLET BY MOUTH EVERY DAY   90 tablet   1   . loperamide (IMODIUM) 2 MG capsule   Oral   Take 4 mg by mouth as needed for diarrhea or loose stools.         . Melatonin 3 MG TABS   Oral   Take 1 tablet by mouth at bedtime.         . Multiple Vitamin (MULTIVITAMIN) capsule   Oral   Take 1 capsule by mouth daily.         . Omega-3 Fatty Acids (FISH OIL) 500 MG CAPS   Oral   Take 1 capsule by mouth daily.         Marland Kitchen. omeprazole (PRILOSEC) 20 MG capsule   Oral   Take 20 mg by mouth 2 (two) times daily.         . predniSONE (DELTASONE) 10 MG tablet   Oral   Take 1 tablet (10 mg total) by mouth daily.   30 tablet   5   . tamsulosin (FLOMAX) 0.4 MG CAPS capsule   Oral   Take 1 capsule (0.4 mg total) by mouth daily after supper.   30 capsule   11   . Teriparatide, Recombinant, (FORTEO) 600 MCG/2.4ML  SOLN   Subcutaneous   Inject 600 mcg into the skin daily. Inject 2.4 ml daily.         Marland Kitchen. ACCU-CHEK AVIVA PLUS test strip      USE TO TEST BLOOD GLUCOSE EVERY DAY   50 each   2   . diphenoxylate-atropine (LOMOTIL) 2.5-0.025 MG per tablet   Oral   Take 1 tablet by mouth 4 (four) times daily as needed for diarrhea or loose stools.   30 tablet   0    BP 118/62  Pulse 89  Temp(Src) 98.9 F (37.2 C) (Oral)  Resp 18  SpO2 95%  Physical Exam  Nursing note and vitals reviewed. Constitutional: He is oriented to person, place, and time. He appears well-developed and well-nourished. No distress.  HENT:  Head: Normocephalic.  Mouth/Throat: Oropharynx is clear and moist. Mucous membranes are not dry. No oropharyngeal exudate, posterior oropharyngeal edema or posterior oropharyngeal erythema.  Eyes: Conjunctivae are normal. Pupils are equal, round, and reactive to light. No scleral icterus.  Neck: Normal range of motion. Neck supple. No thyromegaly present.  Cardiovascular: Normal rate and regular rhythm.  Exam reveals no gallop and no friction rub.   No murmur heard. Pulmonary/Chest: Effort normal.  No respiratory distress. He has no wheezes. He has rhonchi (few scattered rhonchi in both bases). He has no rales.  Abdominal: Soft. He exhibits no distension. Bowel sounds are increased. There is no tenderness. There is no rebound.  Musculoskeletal: Normal range of motion.       Right shoulder: He exhibits tenderness.  Neurological: He is alert and oriented to person, place, and time.  Skin: Skin is warm and dry. No rash noted.  Psychiatric: He has a normal mood and affect. His behavior is normal.    ED Course  Procedures (including critical care time)  DIAGNOSTIC STUDIES: Oxygen Saturation is 95% on room air, adequate by my interpretation.    COORDINATION OF CARE: 11:12 AM-Discussed treatment plan which includes IV fluids, anti-emetics, anti-diarrheal medications, and labs with pt  at bedside and pt agreed to plan.    Labs Review Labs Reviewed  CBC WITH DIFFERENTIAL - Abnormal; Notable for the following:    WBC 13.1 (*)    RBC 3.89 (*)    Hemoglobin 12.2 (*)    HCT 37.3 (*)    RDW 15.6 (*)    Platelets 135 (*)    Neutrophils Relative % 91 (*)    Neutro Abs 11.9 (*)    Lymphocytes Relative 4 (*)    Lymphs Abs 0.5 (*)    All other components within normal limits  BASIC METABOLIC PANEL - Abnormal; Notable for the following:    Glucose, Bld 111 (*)    GFR calc non Af Amer 73 (*)    GFR calc Af Amer 85 (*)    All other components within normal limits  CLOSTRIDIUM DIFFICILE BY PCR    Imaging Review No results found.  EKG Interpretation   None       MDM   1. Diarrhea   2. Chronic back pain    No additional diarrhea in the emergency room after the above treatment. His only complaint was of chronic back pain. Better after some IV pain meds.  Plan is discharge home with Lomotil. Primary care followup.   I personally performed the services described in this documentation, which was scribed in my presence. The recorded information has been reviewed and is accurate.    Rolland Porter, MD 09/30/13 [phone]

## 2013-10-01 ENCOUNTER — Ambulatory Visit: Payer: No Typology Code available for payment source | Admitting: Family Medicine

## 2013-10-01 ENCOUNTER — Encounter: Payer: Self-pay | Admitting: Family

## 2013-10-01 NOTE — Telephone Encounter (Signed)
Notified patient according to [facility], medication has been sent in to pharmacy. Patient verbalized understanding.

## 2013-10-04 ENCOUNTER — Ambulatory Visit (INDEPENDENT_AMBULATORY_CARE_PROVIDER_SITE_OTHER)
Admission: RE | Admit: 2013-10-04 | Discharge: 2013-10-04 | Disposition: A | Discharge status: Home / Self Care | Payer: Medicare Other | Source: Ambulatory Visit | Attending: Surgery | Admitting: Surgery

## 2013-10-04 ENCOUNTER — Encounter (HOSPITAL_COMMUNITY): Payer: Self-pay | Admitting: Respiratory Therapy

## 2013-10-04 ENCOUNTER — Encounter: Payer: Self-pay | Admitting: Family

## 2013-10-04 ENCOUNTER — Ambulatory Visit (INDEPENDENT_AMBULATORY_CARE_PROVIDER_SITE_OTHER): Payer: Medicare Other | Admitting: Family

## 2013-10-04 VITALS — BP 163/71 | HR 58 | Resp 14 | Ht 68.0 in | Wt 150.0 lb

## 2013-10-04 DIAGNOSIS — I739 Peripheral vascular disease, unspecified: Secondary | ICD-10-CM

## 2013-10-04 DIAGNOSIS — S98139A Complete traumatic amputation of one unspecified lesser toe, initial encounter: Secondary | ICD-10-CM | POA: Diagnosis not present

## 2013-10-04 DIAGNOSIS — Y832 Surgical operation with anastomosis, bypass or graft as the cause of abnormal reaction of the patient, or of later complication, without mention of misadventure at the time of the procedure: Secondary | ICD-10-CM | POA: Diagnosis not present

## 2013-10-04 DIAGNOSIS — I10 Essential (primary) hypertension: Secondary | ICD-10-CM | POA: Diagnosis not present

## 2013-10-04 DIAGNOSIS — K219 Gastro-esophageal reflux disease without esophagitis: Secondary | ICD-10-CM | POA: Diagnosis not present

## 2013-10-04 DIAGNOSIS — Z87891 Personal history of nicotine dependence: Secondary | ICD-10-CM | POA: Diagnosis not present

## 2013-10-04 DIAGNOSIS — M5137 Other intervertebral disc degeneration, lumbosacral region: Secondary | ICD-10-CM | POA: Diagnosis not present

## 2013-10-04 DIAGNOSIS — T82898A Other specified complication of vascular prosthetic devices, implants and grafts, initial encounter: Secondary | ICD-10-CM | POA: Diagnosis present

## 2013-10-04 DIAGNOSIS — I2510 Atherosclerotic heart disease of native coronary artery without angina pectoris: Secondary | ICD-10-CM | POA: Diagnosis not present

## 2013-10-04 DIAGNOSIS — Z951 Presence of aortocoronary bypass graft: Secondary | ICD-10-CM | POA: Diagnosis not present

## 2013-10-04 DIAGNOSIS — Z48812 Encounter for surgical aftercare following surgery on the circulatory system: Secondary | ICD-10-CM

## 2013-10-04 DIAGNOSIS — M47812 Spondylosis without myelopathy or radiculopathy, cervical region: Secondary | ICD-10-CM | POA: Diagnosis not present

## 2013-10-04 DIAGNOSIS — M069 Rheumatoid arthritis, unspecified: Secondary | ICD-10-CM | POA: Diagnosis not present

## 2013-10-04 DIAGNOSIS — Z7901 Long term (current) use of anticoagulants: Secondary | ICD-10-CM | POA: Diagnosis not present

## 2013-10-04 DIAGNOSIS — R7309 Other abnormal glucose: Secondary | ICD-10-CM | POA: Diagnosis not present

## 2013-10-04 DIAGNOSIS — E785 Hyperlipidemia, unspecified: Secondary | ICD-10-CM | POA: Diagnosis not present

## 2013-10-04 DIAGNOSIS — I4891 Unspecified atrial fibrillation: Secondary | ICD-10-CM | POA: Diagnosis not present

## 2013-10-04 NOTE — Progress Notes (Signed)
[facility] [place] HISTORY AND PHYSICAL -PAD  History of Present Illness [name] is a 68 y.o. male patient of Dr. Myra Gianotti who is status post distal left superficial femoral to posterior tibial artery bypass graft with ipsilateral non-reversed, translocated saphenous vein, and left fifth toe amputation with metatarsal head on 01/28/2013. This was done in the setting of an ischemic left fifth toe. He returns today for PAD and graft surveillance. He started having left fifth metatarsal pain with walking about a month ago, not just when he has a shoe on, pain is intermittent, denies non-healing wounds. He denies rest pain. He cannot distinguish claudication type pain from joint pain since he has severe RA.  Pt reports New Medical or Surgical History: fractured left ankle and rod was placed in this area. Reports that he has 2 pinched nerves in his lumbar spine that limits his walking due to pain, is scheduled for lumbar spine surgery in May.  Pt Diabetic: "borderline", no DM medications Pt smoker: former smoker, quit in 1967  Pt meds include: Statin :Yes Betablocker: Yes ASA: Yes Other anticoagulants/antiplatelets: no  Past Medical History  Diagnosis Date  . 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 Hyperlipidemia   . Hypertension   . PVD (peripheral vascular disease)     Left iliac PCI/stent  . Cerebrovascular disease   . Tobacco abuse, in remission     Remote  . GERD (gastroesophageal reflux disease)   . Hypothyroid   . Allergic rhinitis   . Chronic lung disease     ?  Rheumatoid lung; ?  Adverse reaction to methotrexate  . Schatzki's ring   . Hx of Clostridium difficile infection     Recurrent; associated 40 pound weight loss  . Achalasia   . Hiatal hernia   . Diabetes mellitus     borderline no meds  . Atrial fibrillation   . Chronic back pain   . 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 Cervical spondylosis    . DDD (degenerative disc disease), lumbar   . Nephrolithiasis 2010    s/p lithotripsy  . Pneumonia     Social History History  Substance Use Topics  . Smoking status: Former Smoker -- 0.50 packs/day for 5 years    Types: Cigarettes    Quit date: 09/23/1965  . Smokeless tobacco: Not on file  . Alcohol Use: No    Family History Family History  Problem Relation Age of Onset  . Stomach cancer Mother 81  . Cancer Mother   . Heart attack Father   . Heart disease Father   . Heart disease Brother   . Leukemia Brother   . Lung disease Son     infant  . Lung disease Daughter     infant    Past Surgical History  Procedure Laterality Date  . Coronary artery bypass graft      x6 In 1998  . Total knee arthroplasty      Right  . Ankle fusion      Left  . Wrist fusion      Right  . Shoulder surgery    . Colonoscopy  09/12/2011    Dr. Elly Modena hemorrhoids, normal colon and distal terminal ileum. Random bx negative. Stool for CDiff positive.  . Esophagogastroduodenoscopy  09/13/2011    Dr. Lyndle Herrlich plawues mid-esophagus KOH negative. Distal esophageal ring and ulcer. Mild gastritis. duodenal diverticulum. Savaory dilation 12mm.   Gaspar Bidding  dilation  09/13/2011  . Esophageal manometry  09/30/2011    Procedure: ESOPHAGEAL MANOMETRY (EM);  Surgeon: Daniel Jacobs, MD;  Location: [facility];  Service: Endoscopy;  Laterality: N/A;  . Esophagogastroduodenoscopy  09/12/2011    Dr. Rourk-->schatzki rings s/p dilation  . Cardiac surgery    . Esophagomyotomy  11/12/11    [facility]- with DOR antireflux surgery  . Esophagogastroduodenoscopy  06/25/2012    Procedure: ESOPHAGOGASTRODUODENOSCOPY (EGD);  Surgeon: Robert M Rourk, MD;  [facility];  Service: Endoscopy;  Laterality: N/A;  7:30  . Savory dilation  06/25/2012    Procedure: SAVORY DILATION;  Surgeon: Robert M Rourk, MD;  [facility];  Service: Endoscopy;  Laterality: N/A;  . Maloney dilation   06/25/2012    Procedure: MALONEY DILATION;  Surgeon: Robert M Rourk, MD;  [facility];  Service: Endoscopy;  Laterality: N/A;  . Ankle fusion  09/01/2012    Procedure: ANKLE FUSION;  Surgeon: Peter W Whitfield, MD;  Location: [facility] OR;  Service: Orthopedics;  Laterality: Left;  Take down of angulated tibial/fibula Fractures  . Femoral-tibial bypass graft Left 01/28/2013    Procedure: BYPASS GRAFT FEMORAL-TIBIAL ARTERY;  Surgeon: Vance W Brabham, MD;  Location: [facility] OR;  Service: Vascular;  Laterality: Left;  Ultrasound Guided  . Amputation Left 01/28/2013    Procedure: AMPUTATION LEFT 5TH TOE;  Surgeon: Vance W Brabham, MD;  Location: [facility] OR;  Service: Vascular;  Laterality: Left;    Allergies  Allergen Reactions  . Amoxicillin Hives and Other (See Comments)    Hallucinations   . Ciprofloxacin Other (See Comments)    C-diff  . Doxycycline Nausea And Vomiting  . Levofloxacin Nausea And Vomiting    Current Outpatient Prescriptions  Medication Sig Dispense Refill  . ACCU-CHEK AVIVA PLUS test strip USE TO TEST BLOOD GLUCOSE EVERY DAY  50 each  2  . ALPRAZolam (XANAX) 0.5 MG tablet Take 1 tablet (0.5 mg total) by mouth daily.  30 tablet  5  . aspirin EC 81 MG tablet Take 81 mg by mouth daily.        . atenolol (TENORMIN) 25 MG tablet Take 25 mg by mouth daily.      . atorvastatin (LIPITOR) 80 MG tablet Take 80 mg by mouth daily.      . B Complex Vitamins (VITAMIN B COMPLEX PO) Take 1 tablet by mouth daily.      . cholecalciferol (VITAMIN D) 400 UNITS TABS Take 400 Units by mouth daily.       . diphenoxylate-atropine (LOMOTIL) 2.5-0.025 MG per tablet Take 1 tablet by mouth 4 (four) times daily as needed for diarrhea or loose stools.  30 tablet  0  . folic acid (FOLVITE) 1 MG tablet Take 1 mg by mouth daily.        . HYDROcodone-acetaminophen (NORCO) 10-325 MG per tablet Take 1 tablet by mouth 4 (four) times daily.      . ibuprofen (ADVIL,MOTRIN) 800 MG tablet Take 800 mg by mouth 3 (three)  times daily.      . leflunomide (ARAVA) 20 MG tablet Take 20 mg by mouth daily.      . levothyroxine (SYNTHROID, LEVOTHROID) 100 MCG tablet Take 1 tablet (100 mcg total) by mouth daily.  90 tablet  5  . lisinopril (PRINIVIL,ZESTRIL) 20 MG tablet TAKE 1 TABLET BY MOUTH EVERY DAY  90 tablet  1  . loperamide (IMODIUM) 2 MG capsule Take 4 mg by mouth as needed for diarrhea or   loose stools.      . Melatonin 3 MG TABS Take 1 tablet by mouth at bedtime.      . Multiple Vitamin (MULTIVITAMIN) capsule Take 1 capsule by mouth daily.      . Omega-3 Fatty Acids (FISH OIL) 500 MG CAPS Take 1 capsule by mouth daily.      Marland Kitchen omeprazole (PRILOSEC) 20 MG capsule Take 20 mg by mouth 2 (two) times daily.      . predniSONE (DELTASONE) 10 MG tablet Take 1 tablet (10 mg total) by mouth daily.  30 tablet  5  . tamsulosin (FLOMAX) 0.4 MG CAPS capsule Take 1 capsule (0.4 mg total) by mouth daily after supper.  30 capsule  11  . Teriparatide, Recombinant, (FORTEO) 600 MCG/2.4ML SOLN Inject 600 mcg into the skin daily. Inject 2.4 ml daily.       No current facility-administered medications for this visit.    ROS: See HPI for pertinent positives and negatives.   Physical Examination  Filed Vitals:   10/04/13 1036  BP: 163/71  Pulse: 58  Resp: 14   Filed Weights   10/04/13 1036  Weight: 150 lb (68.04 kg)   Body mass index is 22.81 kg/(m^2).  General: A&O x 3, WDWN,  Gait: slow, deliberate Eyes: PERRLA, Pulmonary: CTAB, without wheezes , rales or rhonchi Cardiac: regular Rythm , without murmur          Carotid Bruits Left Right   Negative Negative  Aorta: is not palpable Radial pulses: are 2+ and =                           VASCULAR EXAM: Extremities without ischemic changes  without Gangrene; without open wounds.                                                                                                          LE Pulses LEFT RIGHT       FEMORAL   palpable   palpable        POPLITEAL  not  palpable   not palpable       POSTERIOR TIBIAL   palpable   not palpable        DORSALIS PEDIS      ANTERIOR TIBIAL  palpable  faintly palpable        PERONEAL not Palpable   not Palpable    Abdomen: soft, NT, no masses. Skin: no rashes, no ulcers noted. Musculoskeletal: some generalized muscle, RA deformities noted in hands.  Neurologic: A&O X 3; Appropriate Affect ; SENSATION: normal; MOTOR FUNCTION:  moving all extremities equally, motor strength 4/5 throughout. Speech is fluent/normal. CN 2-12 intact.    Non-Invasive Vascular Imaging: DATE: 10/04/2013 ABI: Bilateral ABI's could not be calculated due to non-compressible vessels. Waveform analysis indicates mild arterial insufficiency in the bilateral lower extremities.  Bilateral toe pressures are abnormal. DUPLEX SCAN OF LLE BYPASS: Patent graft with mild irregularity in the distal segment and significant stenosis at the distal anastomosis which was not present  on 04/26/13 LLE Duplex.  ASSESSMENT: DEMARIAN EPPS is a 68 y.o. male who is status post distal left superficial femoral to posterior tibial artery bypass graft with ipsilateral non-reversed, translocated saphenous vein, and left fifth toe amputation with metatarsal head on 01/28/2013.  He has a patent graft with mild irregularity in the distal segment and significant stenosis at the distal anastomosis which was not present on 04/26/13 LLE Duplex. Mild bilateral LE arterial insufficiency. Needs aortogram with run-off , possible intervention of the left LE bypass graft which now has a significant stenosis at the distal anastomosis that was not present on 04/26/13 LLE Duplex. Fortunately he is not a smoker and his DM is under good control  Since it is borderline.  PLAN:  I discussed in depth with the patient the nature of atherosclerosis, and emphasized the importance of maximal medical management including strict control of blood pressure, blood glucose, and lipid levels,  obtaining regular exercise, and continued cessation of smoking.  The patient is aware that without maximal medical management the underlying atherosclerotic disease process will progress, limiting the benefit of any interventions.  Based on the patient's vascular studies and examination, and after discussing with Dr. Myra Gianotti, pt will be scheduled tomorrow for aortogram with run-off, possible intervention.   The patient was given information about PAD including signs, symptoms, treatment, what symptoms should prompt the patient to seek immediate medical care, and risk reduction measures to take.  Charisse March, RN, MSN, FNP-C [facility] [place] Phone: [phone]  Clinic MD: Myra Gianotti  10/04/2013 9:37 AM

## 2013-10-04 NOTE — Patient Instructions (Addendum)
Peripheral Vascular Disease Peripheral Vascular Disease (PVD), also called Peripheral Arterial Disease (PAD), is a circulation problem caused by cholesterol (atherosclerotic plaque) deposits in the arteries. PVD commonly occurs in the lower extremities (legs) but it can occur in other areas of the body, such as your arms. The cholesterol buildup in the arteries reduces blood flow which can cause pain and other serious problems. The presence of PVD can place a person at risk for Coronary Artery Disease (CAD).  CAUSES  Causes of PVD can be many. It is usually associated with more than one risk factor such as:   High Cholesterol.  Smoking.  Diabetes.  Lack of exercise or inactivity.  High blood pressure (hypertension).  Obesity.  Family history. SYMPTOMS   When the lower extremities are affected, patients with PVD may experience:  Leg pain with exertion or physical activity. This is called INTERMITTENT CLAUDICATION. This may present as cramping or numbness with physical activity. The location of the pain is associated with the level of blockage. For example, blockage at the abdominal level (distal abdominal aorta) may result in buttock or hip pain. Lower leg arterial blockage may result in calf pain.  As PVD becomes more severe, pain can develop with less physical activity.  In people with severe PVD, leg pain may occur at rest.  Other PVD signs and symptoms:  Leg numbness or weakness.  Coldness in the affected leg or foot, especially when compared to the other leg.  A change in leg color.  Patients with significant PVD are more prone to ulcers or sores on toes, feet or legs. These may take longer to heal or may reoccur. The ulcers or sores can become infected.  If signs and symptoms of PVD are ignored, gangrene may occur. This can result in the loss of toes or loss of an entire limb.  Not all leg pain is related to PVD. Other medical conditions can cause leg pain such  as:  Blood clots (embolism) or Deep Vein Thrombosis.  Inflammation of the blood vessels (vasculitis).  Spinal stenosis. DIAGNOSIS  Diagnosis of PVD can involve several different types of tests. These can include:  Pulse Volume Recording Method (PVR). This test is simple, painless and does not involve the use of X-rays. PVR involves measuring and comparing the blood pressure in the arms and legs. An ABI (Ankle-Brachial Index) is calculated. The normal ratio of blood pressures is 1. As this number becomes smaller, it indicates more severe disease.  < 0.95  indicates significant narrowing in one or more leg vessels.  <0.8 there will usually be pain in the foot, leg or buttock with exercise.  <0.4 will usually have pain in the legs at rest.  <0.25  usually indicates limb threatening PVD.  Doppler detection of pulses in the legs. This test is painless and checks to see if you have a pulses in your legs/feet.  A dye or contrast material (a substance that highlights the blood vessels so they show up on x-ray) may be given to help your caregiver better see the arteries for the following tests. The dye is eliminated from your body by the kidney's. Your caregiver may order blood work to check your kidney function and other laboratory values before the following tests are performed:  Magnetic Resonance Angiography (MRA). An MRA is a picture study of the blood vessels and arteries. The MRA machine uses a large magnet to produce images of the blood vessels.  Computed Tomography Angiography (CTA). A CTA is a  specialized x-ray that looks at how the blood flows in your blood vessels. An IV may be inserted into your arm so contrast dye can be injected.  Angiogram. Is a procedure that uses x-rays to look at your blood vessels. This procedure is minimally invasive, meaning a small incision (cut) is made in your groin. A small tube (catheter) is then inserted into the artery of your groin. The catheter is  guided to the blood vessel or artery your caregiver wants to examine. Contrast dye is injected into the catheter. X-rays are then taken of the blood vessel or artery. After the images are obtained, the catheter is taken out. TREATMENT  Treatment of PVD involves many interventions which may include:  Lifestyle changes:  Quitting smoking.  Exercise.  Following a low fat, low cholesterol diet.  Control of diabetes.  Foot care is very important to the PVD patient. Good foot care can help prevent infection.  Medication:  Cholesterol-lowering medicine.  Blood pressure medicine.  Anti-platelet drugs.  Certain medicines may reduce symptoms of Intermittent Claudication.  Interventional/Surgical options:  Angioplasty. An Angioplasty is a procedure that inflates a balloon in the blocked artery. This opens the blocked artery to improve blood flow.  Stent Implant. A wire mesh tube (stent) is placed in the artery. The stent expands and stays in place, allowing the artery to remain open.  Peripheral Bypass Surgery. This is a surgical procedure that reroutes the blood around a blocked artery to help improve blood flow. This type of procedure may be performed if Angioplasty or stent implants are not an option. SEEK IMMEDIATE MEDICAL CARE IF:   You develop pain or numbness in your arms or legs.  Your arm or leg turns cold, becomes blue in color.  You develop redness, warmth, swelling and pain in your arms or legs. MAKE SURE YOU:   Understand these instructions.  Will watch your condition.  Will get help right away if you are not doing well or get worse. Document Released: 10/17/2004 Document Revised: 12/02/2011 Document Reviewed: 09/13/2008 [facility] Patient Information 2014 [place], [place].  Aortography The aorta is the largest blood vessel (artery) in the body. To find out what is going on inside this artery, your caregiver can use a procedure called an aortography. In this  procedure, a dye is injected into the aorta. The dye can be seen on an X-ray. This shows how well blood is flowing through the aorta. Aortography can show if there:  Are any blockages in the aorta.  Is a tear in the wall of the aorta (aortic dissection).  Is a weak spot with widening in the aorta (aortic aneurysm). LET YOUR CAREGIVER KNOW ABOUT:  Health problems such as:  Kidney failure.  Kidney transplant.  Diabetes.  Allergies to the following:  Medications.  Contrast dye or iodine.  Seafood such as shrimp.  Tape.  Any food products.  Medications you are taking, including:  Blood thinners (anticoagulants), aspirin, or other drugs that could affect blood clotting.  Steroid medication (by mouth or creams).  Any over-the-counter medicines, herbs, eye drops, and creams.  History of bleeding or blood problems.  Previous problems with anesthetics, including local anesthetics.  History of blood clots (thrombophlebitis).  Smoking history.  If you may be pregnant. RISKS AND COMPLICATIONS With any procedure, risks and complications can occur. With aortography, some people may experience:  Bleeding.  An allergic reaction to the dye or medications used.  Swelling or bruising at the groin insertion site.  Infection.  Blood clots. BEFORE THE PROCEDURE   If you take prescription blood thinners, ask your caregiver if you should stop taking them.  If you take over-the-counter medications such as aspirin or nonsteroidal anti-inflammatory drugs (NSAIDs), ask your caregiver if you should stop taking them. Also, if you take vitamin E, consult with your caregiver.  Do not eat or drink 8 hours before the procedure or as instructed by your caregiver.  Arrive at least 1 hour before the procedure or as recommended by your caregiver. You will need to fill out paperwork and sign consent forms for the procedure.  Make arrangements in advance for someone to drive you home  after the procedure. PROCEDURE  Preparation:  An IV will be started in your hand or arm. Medications such as a sedative and pain medicine will be given through the IV. These will help you relax and decrease pain during the procedure.  Hair will be removed from your groin area and the site will be cleaned with an antiseptic solution.  You will be given a local anesthetic to your groin area. (this area of your body will be numbed, but you will stay awake).  A specialized physician (radiologist) will insert a needle into the artery of your groin. A thin flexible wire will be passed through the needle. Over this wire, a thin tube (catheter) will be inserted. The catheter will be threaded up the artery to the aorta. Once the catheter is in the aorta:  Dye will be injected into the catheter. You may feel some warmth or a burning sensation when the dye is injected.  Once the dye is in your blood vessels, X-ray pictures will be taken. You will be asked to lie still and to hold your breath for a few seconds for each picture.  The catheter will be removed after the procedure. Pressure will be applied for up to 20 minutes to close the puncture hole made in your groin. A bandage will be placed over the insertion site. AFTER THE PROCEDURE   You will be taken to a recovery room area. Your vital signs (blood pressure, heart rate, breathing) and the insertion site will be monitored.  You will need to lie flat for about 4 hours after the procedure. Bending your leg that has the groin insertion site can cause bleeding.  If your blood pressure and heart rate are stable and no bleeding occurs at the insertion site, you may go home the same day as your procedure.  If complications occur or your caregiver feels you should be watched, you may need to stay in the hospital overnight. HOME CARE INSTRUCTIONS   Wait a day before driving a car.  Do not do any heavy lifting or other types of hard physical work for  about a week.  If you have to cough or sneeze, first put pressure on the puncture site. This protects the wound.  Resume your home medications as instructed by your caregiver.  Only take over-the-counter or prescription medicines for pain, discomfort, or fever as directed by your caregiver. Do not take aspirin unless instructed by your caregiver. Aspirin may increase your risk of bleeding at the insertion site. SEEK MEDICAL CARE IF:   The insertion site becomes red, swollen or leaks fluid or blood.  Your pain increases or does not get better with pain medication.  You have nausea and vomiting.  You develop a fever of more than 100.5 F (38.1 C). SEEK IMMEDIATE MEDICAL CARE IF:   Bleeding from  the insertion site does not stop. Hold pressure to this area. Call your local emergency service immediately!  If the leg that has the insertion site becomes:  Blue.  Pale or cold.  Numb.  You develop chest pain that is crushing or pressure-like.  You have difficulty breathing or shortness of breath.  You faint or pass out.  You develop a fever of 102.0 F (38.9 C) or higher. MAKE SURE YOU:   Understand these instructions.  Will watch your condition.  Will get help right away if you are not doing well or get worse. Document Released: 01/26/2009 Document Revised: 12/02/2011 Document Reviewed: 01/26/2009 [facility] Patient Information 2014 [place], [place].

## 2013-10-05 ENCOUNTER — Ambulatory Visit (HOSPITAL_COMMUNITY)
Admission: RE | Admit: 2013-10-05 | Discharge: 2013-10-05 | Disposition: A | Discharge status: Home / Self Care | Payer: Medicare Other | Source: Ambulatory Visit | Attending: Surgery | Admitting: Surgery

## 2013-10-05 ENCOUNTER — Encounter (HOSPITAL_COMMUNITY)
Admission: RE | Disposition: A | Discharge status: Home / Self Care | Payer: Self-pay | Source: Ambulatory Visit | Attending: Surgery

## 2013-10-05 ENCOUNTER — Other Ambulatory Visit: Payer: Self-pay | Admitting: *Deleted

## 2013-10-05 ENCOUNTER — Other Ambulatory Visit: Payer: Self-pay

## 2013-10-05 ENCOUNTER — Telehealth: Payer: Self-pay | Admitting: Surgery

## 2013-10-05 DIAGNOSIS — E785 Hyperlipidemia, unspecified: Secondary | ICD-10-CM | POA: Insufficient documentation

## 2013-10-05 DIAGNOSIS — Z7901 Long term (current) use of anticoagulants: Secondary | ICD-10-CM | POA: Insufficient documentation

## 2013-10-05 DIAGNOSIS — Z951 Presence of aortocoronary bypass graft: Secondary | ICD-10-CM | POA: Insufficient documentation

## 2013-10-05 DIAGNOSIS — R7309 Other abnormal glucose: Secondary | ICD-10-CM | POA: Insufficient documentation

## 2013-10-05 DIAGNOSIS — T82898A Other specified complication of vascular prosthetic devices, implants and grafts, initial encounter: Secondary | ICD-10-CM | POA: Insufficient documentation

## 2013-10-05 DIAGNOSIS — M5137 Other intervertebral disc degeneration, lumbosacral region: Secondary | ICD-10-CM | POA: Insufficient documentation

## 2013-10-05 DIAGNOSIS — K219 Gastro-esophageal reflux disease without esophagitis: Secondary | ICD-10-CM | POA: Insufficient documentation

## 2013-10-05 DIAGNOSIS — I4891 Unspecified atrial fibrillation: Secondary | ICD-10-CM | POA: Insufficient documentation

## 2013-10-05 DIAGNOSIS — I10 Essential (primary) hypertension: Secondary | ICD-10-CM | POA: Insufficient documentation

## 2013-10-05 DIAGNOSIS — Z87891 Personal history of nicotine dependence: Secondary | ICD-10-CM | POA: Insufficient documentation

## 2013-10-05 DIAGNOSIS — M47812 Spondylosis without myelopathy or radiculopathy, cervical region: Secondary | ICD-10-CM | POA: Insufficient documentation

## 2013-10-05 DIAGNOSIS — Z48812 Encounter for surgical aftercare following surgery on the circulatory system: Secondary | ICD-10-CM

## 2013-10-05 DIAGNOSIS — I2510 Atherosclerotic heart disease of native coronary artery without angina pectoris: Secondary | ICD-10-CM | POA: Insufficient documentation

## 2013-10-05 DIAGNOSIS — I739 Peripheral vascular disease, unspecified: Secondary | ICD-10-CM

## 2013-10-05 DIAGNOSIS — M069 Rheumatoid arthritis, unspecified: Secondary | ICD-10-CM | POA: Insufficient documentation

## 2013-10-05 DIAGNOSIS — T82598A Other mechanical complication of other cardiac and vascular devices and implants, initial encounter: Secondary | ICD-10-CM

## 2013-10-05 DIAGNOSIS — M51379 Other intervertebral disc degeneration, lumbosacral region without mention of lumbar back pain or lower extremity pain: Secondary | ICD-10-CM | POA: Insufficient documentation

## 2013-10-05 DIAGNOSIS — S98139A Complete traumatic amputation of one unspecified lesser toe, initial encounter: Secondary | ICD-10-CM | POA: Insufficient documentation

## 2013-10-05 DIAGNOSIS — Y832 Surgical operation with anastomosis, bypass or graft as the cause of abnormal reaction of the patient, or of later complication, without mention of misadventure at the time of the procedure: Secondary | ICD-10-CM | POA: Insufficient documentation

## 2013-10-05 HISTORY — PX: ABDOMINAL AORTAGRAM: SHX5454

## 2013-10-05 HISTORY — PX: LOWER EXTREMITY ANGIOGRAM: SHX5508

## 2013-10-05 LAB — POCT I-STAT, CHEM 8
BUN: 18 mg/dL (ref 6–23)
CHLORIDE: 113 meq/L — AB (ref 96–112)
Calcium, Ion: 1 mmol/L — ABNORMAL LOW (ref 1.13–1.30)
Creatinine, Ser: 0.9 mg/dL (ref 0.50–1.35)
Glucose, Bld: 118 mg/dL — ABNORMAL HIGH (ref 70–99)
HCT: 38 % — ABNORMAL LOW (ref 39.0–52.0)
Hemoglobin: 12.9 g/dL — ABNORMAL LOW (ref 13.0–17.0)
POTASSIUM: 3.9 meq/L (ref 3.7–5.3)
Sodium: 145 mEq/L (ref 137–147)
TCO2: 19 mmol/L (ref 0–100)

## 2013-10-05 LAB — GLUCOSE, CAPILLARY: Glucose-Capillary: 116 mg/dL — ABNORMAL HIGH (ref 70–99)

## 2013-10-05 SURGERY — ABDOMINAL AORTAGRAM
Anesthesia: LOCAL

## 2013-10-05 MED ORDER — ONDANSETRON HCL 4 MG/2ML IJ SOLN
INTRAMUSCULAR | Status: AC
  Filled 2013-10-05: qty 2

## 2013-10-05 MED ORDER — MORPHINE SULFATE 10 MG/ML IJ SOLN
2.0000 mg | INTRAMUSCULAR | Status: DC | PRN
  Administered 2013-10-05: 2 mg via INTRAVENOUS

## 2013-10-05 MED ORDER — SODIUM CHLORIDE 0.9 % IV SOLN
INTRAVENOUS | Status: DC
  Administered 2013-10-05: 10:00:00 via INTRAVENOUS

## 2013-10-05 MED ORDER — ACETAMINOPHEN 325 MG PO TABS: 325.0000 mg | ORAL_TABLET | ORAL | Status: DC | PRN

## 2013-10-05 MED ORDER — ONDANSETRON HCL 4 MG/2ML IJ SOLN: 4.0000 mg | Freq: Four times a day (QID) | INTRAMUSCULAR | Status: DC | PRN

## 2013-10-05 MED ORDER — HYDRALAZINE HCL 20 MG/ML IJ SOLN
10.0000 mg | INTRAMUSCULAR | Status: DC | PRN
  Administered 2013-10-05: 10 mg via INTRAVENOUS

## 2013-10-05 MED ORDER — HYDROMORPHONE HCL PF 1 MG/ML IJ SOLN
INTRAMUSCULAR | Status: AC
  Administered 2013-10-05: 2 mg
  Filled 2013-10-05: qty 2

## 2013-10-05 MED ORDER — MORPHINE SULFATE 2 MG/ML IJ SOLN
INTRAMUSCULAR | Status: AC
  Filled 2013-10-05: qty 1

## 2013-10-05 MED ORDER — ACETAMINOPHEN 325 MG RE SUPP: 325.0000 mg | RECTAL | Status: DC | PRN

## 2013-10-05 MED ORDER — HYDROMORPHONE HCL 1 MG/ML PO LIQD: 2.0000 mg | Freq: Once | ORAL | Status: DC

## 2013-10-05 MED ORDER — PHENOL 1.4 % MT LIQD: 1.0000 | OROMUCOSAL | Status: DC | PRN

## 2013-10-05 MED ORDER — HYDRALAZINE HCL 20 MG/ML IJ SOLN
INTRAMUSCULAR | Status: AC
  Filled 2013-10-05: qty 1

## 2013-10-05 MED ORDER — MIDAZOLAM HCL 2 MG/2ML IJ SOLN
INTRAMUSCULAR | Status: AC
  Filled 2013-10-05: qty 2

## 2013-10-05 MED ORDER — HEPARIN SODIUM (PORCINE) 1000 UNIT/ML IJ SOLN
INTRAMUSCULAR | Status: AC
  Filled 2013-10-05: qty 1

## 2013-10-05 MED ORDER — GUAIFENESIN-DM 100-10 MG/5ML PO SYRP: 15.0000 mL | ORAL_SOLUTION | ORAL | Status: DC | PRN

## 2013-10-05 MED ORDER — HEPARIN (PORCINE) IN NACL 2-0.9 UNIT/ML-% IJ SOLN
INTRAMUSCULAR | Status: AC
  Filled 2013-10-05: qty 1000

## 2013-10-05 MED ORDER — ONDANSETRON HCL 4 MG/2ML IJ SOLN
4.0000 mg | Freq: Once | INTRAMUSCULAR | Status: AC
  Administered 2013-10-05: 4 mg via INTRAVENOUS

## 2013-10-05 MED ORDER — LABETALOL HCL 5 MG/ML IV SOLN
INTRAVENOUS | Status: AC
  Filled 2013-10-05: qty 4

## 2013-10-05 MED ORDER — METOPROLOL TARTRATE 1 MG/ML IV SOLN: 2.0000 mg | INTRAVENOUS | Status: DC | PRN

## 2013-10-05 MED ORDER — SODIUM CHLORIDE 0.9 % IV SOLN: INTRAVENOUS | Status: DC

## 2013-10-05 MED ORDER — OXYCODONE HCL 5 MG PO TABS
5.0000 mg | ORAL_TABLET | ORAL | Status: DC | PRN
  Administered 2013-10-05: 10 mg via ORAL
  Filled 2013-10-05: qty 2

## 2013-10-05 MED ORDER — ALUM & MAG HYDROXIDE-SIMETH 200-200-20 MG/5ML PO SUSP: 15.0000 mL | ORAL | Status: DC | PRN

## 2013-10-05 MED ORDER — FENTANYL CITRATE 0.05 MG/ML IJ SOLN
INTRAMUSCULAR | Status: AC
  Filled 2013-10-05: qty 2

## 2013-10-05 MED ORDER — LIDOCAINE HCL (PF) 1 % IJ SOLN
INTRAMUSCULAR | Status: AC
  Filled 2013-10-05: qty 30

## 2013-10-05 MED ORDER — LABETALOL HCL 5 MG/ML IV SOLN
10.0000 mg | INTRAVENOUS | Status: DC | PRN
  Administered 2013-10-05 (×2): 10 mg via INTRAVENOUS

## 2013-10-05 SURGICAL SUPPLY — 55 items
ADH SKN CLS APL DERMABOND .7 (GAUZE/BANDAGES/DRESSINGS) ×2
BANDAGE ELASTIC 4 VELCRO ST LF (GAUZE/BANDAGES/DRESSINGS) IMPLANT
BANDAGE ESMARK 6X9 LF (GAUZE/BANDAGES/DRESSINGS) IMPLANT
BNDG CMPR 9X6 STRL LF SNTH (GAUZE/BANDAGES/DRESSINGS)
BNDG ESMARK 6X9 LF (GAUZE/BANDAGES/DRESSINGS)
CANISTER SUCTION 2500CC (MISCELLANEOUS) ×3 IMPLANT
CLIP TI MEDIUM 24 (CLIP) ×3 IMPLANT
CLIP TI WIDE RED SMALL 24 (CLIP) ×3 IMPLANT
COVER SURGICAL LIGHT HANDLE (MISCELLANEOUS) ×3 IMPLANT
CUFF TOURNIQUET SINGLE 24IN (TOURNIQUET CUFF) IMPLANT
CUFF TOURNIQUET SINGLE 34IN LL (TOURNIQUET CUFF) IMPLANT
CUFF TOURNIQUET SINGLE 44IN (TOURNIQUET CUFF) IMPLANT
DERMABOND ADVANCED (GAUZE/BANDAGES/DRESSINGS) ×1
DERMABOND ADVANCED .7 DNX12 (GAUZE/BANDAGES/DRESSINGS) ×2 IMPLANT
DRAIN CHANNEL 15F RND FF W/TCR (WOUND CARE) IMPLANT
DRAPE WARM FLUID 44X44 (DRAPE) ×3 IMPLANT
DRAPE X-RAY CASS 24X20 (DRAPES) IMPLANT
DRSG COVADERM 4X10 (GAUZE/BANDAGES/DRESSINGS) IMPLANT
DRSG COVADERM 4X8 (GAUZE/BANDAGES/DRESSINGS) IMPLANT
ELECT REM PT RETURN 9FT ADLT (ELECTROSURGICAL) ×3
ELECTRODE REM PT RTRN 9FT ADLT (ELECTROSURGICAL) ×2 IMPLANT
EVACUATOR SILICONE 100CC (DRAIN) IMPLANT
GLOVE BIOGEL PI IND STRL 7.5 (GLOVE) ×2 IMPLANT
GLOVE BIOGEL PI INDICATOR 7.5 (GLOVE) ×1
GLOVE SURG SS PI 7.5 STRL IVOR (GLOVE) ×3 IMPLANT
GOWN PREVENTION PLUS XXLARGE (GOWN DISPOSABLE) ×3 IMPLANT
GOWN STRL NON-REIN LRG LVL3 (GOWN DISPOSABLE) ×9 IMPLANT
HEMOSTAT SNOW SURGICEL 2X4 (HEMOSTASIS) IMPLANT
KIT BASIN OR (CUSTOM PROCEDURE TRAY) ×3 IMPLANT
KIT ROOM TURNOVER OR (KITS) ×3 IMPLANT
MARKER GRAFT CORONARY BYPASS (MISCELLANEOUS) IMPLANT
NS IRRIG 1000ML POUR BTL (IV SOLUTION) ×6 IMPLANT
PACK PERIPHERAL VASCULAR (CUSTOM PROCEDURE TRAY) ×3 IMPLANT
PAD ARMBOARD 7.5X6 YLW CONV (MISCELLANEOUS) ×6 IMPLANT
PADDING CAST COTTON 6X4 STRL (CAST SUPPLIES) IMPLANT
SET COLLECT BLD 21X3/4 12 (NEEDLE) IMPLANT
STOPCOCK 4 WAY LG BORE MALE ST (IV SETS) IMPLANT
SUT ETHILON 3 0 PS 1 (SUTURE) IMPLANT
SUT PROLENE 5 0 C 1 24 (SUTURE) ×3 IMPLANT
SUT PROLENE 6 0 BV (SUTURE) ×3 IMPLANT
SUT PROLENE 7 0 BV 1 (SUTURE) IMPLANT
SUT SILK 2 0 SH (SUTURE) ×3 IMPLANT
SUT SILK 3 0 (SUTURE)
SUT SILK 3-0 18XBRD TIE 12 (SUTURE) IMPLANT
SUT VIC AB 2-0 CT1 27 (SUTURE) ×6
SUT VIC AB 2-0 CT1 TAPERPNT 27 (SUTURE) ×4 IMPLANT
SUT VIC AB 3-0 SH 27 (SUTURE) ×6
SUT VIC AB 3-0 SH 27X BRD (SUTURE) ×4 IMPLANT
SUT VICRYL 4-0 PS2 18IN ABS (SUTURE) ×6 IMPLANT
TOWEL OR 17X24 6PK STRL BLUE (TOWEL DISPOSABLE) ×6 IMPLANT
TOWEL OR 17X26 10 PK STRL BLUE (TOWEL DISPOSABLE) ×6 IMPLANT
TRAY FOLEY CATH 16FRSI W/METER (SET/KITS/TRAYS/PACK) ×3 IMPLANT
TUBING EXTENTION W/L.L. (IV SETS) IMPLANT
UNDERPAD 30X30 INCONTINENT (UNDERPADS AND DIAPERS) ×3 IMPLANT
WATER STERILE IRR 1000ML POUR (IV SOLUTION) ×3 IMPLANT

## 2013-10-05 NOTE — Interval H&P Note (Signed)
History and Physical Interval Note:  10/05/2013 10:13 AM  [name]  has presented today for surgery, with the diagnosis of Increased stenosis  The various methods of treatment have been discussed with the patient and family. After consideration of risks, benefits and other options for treatment, the patient has consented to  Procedure(s): ABDOMINAL AORTAGRAM (N/A) as a surgical intervention .  The patient's history has been reviewed, patient examined, no change in status, stable for surgery.  I have reviewed the patient's chart and labs.  Questions were answered to the patient's satisfaction.     BRABHAM IV, V. WELLS

## 2013-10-05 NOTE — Op Note (Signed)
Patient name: [name] MRN: [id number] DOB: 03-23-1946 Sex: male  10/05/2013 Pre-operative Diagnosis: Bypass graft stenosis Post-operative diagnosis:  Same Surgeon:  [name] Procedure Performed:  1.  ultrasound-guided access, right femoral artery  2.  abdominal aortogram  3.  left lower extremity runoff  4.  additional order catheterization  5.  angioplasty left posterior tibial artery   Indications:  The patient has previously undergone a femoral to posterior tibial bypass graft.  Ultrasound identified a high-grade stenosis at the distal anastomosis.  Comes in for further dilation.  Procedure:  The patient was identified in the holding area and taken to room 8.  The patient was then placed supine on the table and prepped and draped in the usual sterile fashion.  A time out was called.  Ultrasound was used to evaluate the right common femoral artery.  It was patent .  A digital ultrasound image was acquired.  A micropuncture needle was used to access the right common femoral artery under ultrasound guidance.  An 018 wire was advanced without resistance and a micropuncture sheath was placed.  The 018 wire was removed and a benson wire was placed.  The micropuncture sheath was exchanged for a 5 french sheath.  An omniflush catheter was advanced over the wire to the level of L-1.  An abdominal angiogram was obtained.  Next, using the omniflush catheter and a benson wire, the aortic bifurcation was crossed and the catheter was placed into theleft external iliac artery and left runoff was obtained.  I placed a straight catheter into the superficial femoral artery to get a distal images of the distal anastomosis  Findings:   Aortogram:  No significant suprarenal aortic stenosis is identified.  There is no evidence of renal artery stenosis.  The infrarenal abdominal aorta is widely patent.  The stent within the left common iliac artery is widely patent.  The left external iliac artery  is widely patent.  The right iliac system is widely patent.  Left Lower Extremity:  The left common femoral and profunda femoral arteries are widely patent.  The left superficial femoral artery is patent down to the adductor canal where it occludes.  There is a bypass graft originating from the distal superficial femoral artery.  The proximal anastomosis is widely patent.  The vein bypass graft is widely patent.  The distal anastomosis shows mild narrowing and just beyond the anastomosis is a high-grade stenosis of approximately 80%.  The posterior tibial artery isn't patent down across the ankle.   Intervention:  After the above images were acquired, the decision was made to proceed with intervention.  Over an 035 wire, a 6 French sheath was advanced into the left superficial femoral artery.  The patient was fully heparinized.  A [place] core wire was easily advanced across the distal anastomosis.  I selected a 3 x 20 Fox SV balloon and perform primary balloon angioplasty of the distal anastomosis and posterior tibial artery.  The balloon was taken 14 atmospheres for 2 minutes.  Completion angiography revealed resolution of the stenosis.  At this point catheters and wires were removed.  The sheath was withdrawn to the right external iliac artery.  The patient was taken to the holding area for sheath pull once his coagulation profile corrects.  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  This was successfully dilated using a 3 x 20 balloon  with excellent results.   Juleen China, M.D. [facility] Office: [phone] Pager:  [phone]

## 2013-10-05 NOTE — Progress Notes (Signed)
Fley Cath D/Ced intact without complications

## 2013-10-05 NOTE — H&P (View-Only) (Signed)
[facility] [place] HISTORY AND PHYSICAL -PAD  History of Present Illness [name] is a 68 y.o. male patient of Dr. Myra Gianotti who is status post distal left superficial femoral to posterior tibial artery bypass graft with ipsilateral non-reversed, translocated saphenous vein, and left fifth toe amputation with metatarsal head on 01/28/2013. This was done in the setting of an ischemic left fifth toe. He returns today for PAD and graft surveillance. He started having left fifth metatarsal pain with walking about a month ago, not just when he has a shoe on, pain is intermittent, denies non-healing wounds. He denies rest pain. He cannot distinguish claudication type pain from joint pain since he has severe RA.  Pt reports New Medical or Surgical History: fractured left ankle and rod was placed in this area. Reports that he has 2 pinched nerves in his lumbar spine that limits his walking due to pain, is scheduled for lumbar spine surgery in May.  Pt Diabetic: "borderline", no DM medications Pt smoker: former smoker, quit in 1967  Pt meds include: Statin :Yes Betablocker: Yes ASA: Yes Other anticoagulants/antiplatelets: no  Past Medical History  Diagnosis Date  . 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 Hyperlipidemia   . Hypertension   . PVD (peripheral vascular disease)     Left iliac PCI/stent  . Cerebrovascular disease   . Tobacco abuse, in remission     Remote  . GERD (gastroesophageal reflux disease)   . Hypothyroid   . Allergic rhinitis   . Chronic lung disease     ?  Rheumatoid lung; ?  Adverse reaction to methotrexate  . Schatzki's ring   . Hx of Clostridium difficile infection     Recurrent; associated 40 pound weight loss  . Achalasia   . Hiatal hernia   . Diabetes mellitus     borderline no meds  . Atrial fibrillation   . Chronic back pain   . 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 Cervical spondylosis    . DDD (degenerative disc disease), lumbar   . Nephrolithiasis 2010    s/p lithotripsy  . Pneumonia     Social History History  Substance Use Topics  . Smoking status: Former Smoker -- 0.50 packs/day for 5 years    Types: Cigarettes    Quit date: 09/23/1965  . Smokeless tobacco: Not on file  . Alcohol Use: No    Family History Family History  Problem Relation Age of Onset  . Stomach cancer Mother 81  . Cancer Mother   . Heart attack Father   . Heart disease Father   . Heart disease Brother   . Leukemia Brother   . Lung disease Son     infant  . Lung disease Daughter     infant    Past Surgical History  Procedure Laterality Date  . Coronary artery bypass graft      x6 In 1998  . Total knee arthroplasty      Right  . Ankle fusion      Left  . Wrist fusion      Right  . Shoulder surgery    . Colonoscopy  09/12/2011    Dr. Elly Modena hemorrhoids, normal colon and distal terminal ileum. Random bx negative. Stool for CDiff positive.  . Esophagogastroduodenoscopy  09/13/2011    Dr. Lyndle Herrlich plawues mid-esophagus KOH negative. Distal esophageal ring and ulcer. Mild gastritis. duodenal diverticulum. Savaory dilation 12mm.   Gaspar Bidding  dilation  09/13/2011  . Esophageal manometry  09/30/2011    Procedure: ESOPHAGEAL MANOMETRY (EM);  Surgeon: Rob Buntinganiel Jacobs, MD;  Location: [facility];  Service: Endoscopy;  Laterality: N/A;  . Esophagogastroduodenoscopy  09/12/2011    Dr. Rinaldo Ratelourk-->schatzki rings s/p dilation  . Cardiac surgery    . [facility]  [facility]12[facility]11    [facility]flux [facility]  . Esophagogastroduodenoscopy  06/25/2012    Procedure: ESOPHAGOGASTRODUODENOSCOPY (EGD);  Surgeon: Corbin Adeobert M Rourk, MD;  Location: [facility];  Service: Endoscopy;  Laterality: N/A;  7:30  . Savory dilation  06/25/2012    Procedure: SAVORY DILATION;  Surgeon: Corbin Adeobert M Rourk, MD;  Location: [facility];  Service: Endoscopy;  Laterality: N/A;  Elease Hashimoto. Maloney dilation   06/25/2012    Procedure: Elease HashimotoMALONEY DILATION;  Surgeon: Corbin Adeobert M Rourk, MD;  Location: [facility];  Service: Endoscopy;  Laterality: N/A;  . Ankle fusion  09/01/2012    Procedure: ANKLE FUSION;  Surgeon: Valeria BatmanPeter W Whitfield, MD;  [facility];  Service: Orthopedics;  Laterality: Left;  Take down of angulated tibial/fibula Fractures  . Femoral-tibial bypass graft Left 01/28/2013    Procedure: BYPASS GRAFT FEMORAL-TIBIAL ARTERY;  Surgeon: Nada LibmanVance W Brabham, MD;  [facility];  Service: Vascular;  Laterality: Left;  Ultrasound Guided  . Amputation Left 01/28/2013    Procedure: AMPUTATION LEFT 5TH TOE;  Surgeon: Nada LibmanVance W Brabham, MD;  [facility];  Service: Vascular;  Laterality: Left;    Allergies  Allergen Reactions  . Amoxicillin Hives and Other (See Comments)    Hallucinations   . Ciprofloxacin Other (See Comments)    C-diff  . Doxycycline Nausea And Vomiting  . Levofloxacin Nausea And Vomiting    Current Outpatient Prescriptions  Medication Sig Dispense Refill  . ACCU-CHEK AVIVA PLUS test strip USE TO TEST BLOOD GLUCOSE EVERY DAY  50 each  2  . ALPRAZolam (XANAX) 0.5 MG tablet Take 1 tablet (0.5 mg total) by mouth daily.  30 tablet  5  . aspirin EC 81 MG tablet Take 81 mg by mouth daily.        Marland Kitchen. atenolol (TENORMIN) 25 MG tablet Take 25 mg by mouth daily.      Marland Kitchen. atorvastatin (LIPITOR) 80 MG tablet Take 80 mg by mouth daily.      . B Complex Vitamins (VITAMIN B COMPLEX PO) Take 1 tablet by mouth daily.      . cholecalciferol (VITAMIN D) 400 UNITS TABS Take 400 Units by mouth daily.       . diphenoxylate-atropine (LOMOTIL) 2.5-0.025 MG per tablet Take 1 tablet by mouth 4 (four) times daily as needed for diarrhea or loose stools.  30 tablet  0  . folic acid (FOLVITE) 1 MG tablet Take 1 mg by mouth daily.        Marland Kitchen. HYDROcodone-acetaminophen (NORCO) 10-325 MG per tablet Take 1 tablet by mouth 4 (four) times daily.      Marland Kitchen. ibuprofen (ADVIL,MOTRIN) 800 MG tablet Take 800 mg by mouth 3 (three)  times daily.      Marland Kitchen. leflunomide (ARAVA) 20 MG tablet Take 20 mg by mouth daily.      Marland Kitchen. levothyroxine (SYNTHROID, LEVOTHROID) 100 MCG tablet Take 1 tablet (100 mcg total) by mouth daily.  90 tablet  5  . lisinopril (PRINIVIL,ZESTRIL) 20 MG tablet TAKE 1 TABLET BY MOUTH EVERY DAY  90 tablet  1  . loperamide (IMODIUM) 2 MG capsule Take 4 mg by mouth as needed for diarrhea or  loose stools.      . Melatonin 3 MG TABS Take 1 tablet by mouth at bedtime.      . Multiple Vitamin (MULTIVITAMIN) capsule Take 1 capsule by mouth daily.      . Omega-3 Fatty Acids (FISH OIL) 500 MG CAPS Take 1 capsule by mouth daily.      Marland Kitchen omeprazole (PRILOSEC) 20 MG capsule Take 20 mg by mouth 2 (two) times daily.      . predniSONE (DELTASONE) 10 MG tablet Take 1 tablet (10 mg total) by mouth daily.  30 tablet  5  . tamsulosin (FLOMAX) 0.4 MG CAPS capsule Take 1 capsule (0.4 mg total) by mouth daily after supper.  30 capsule  11  . Teriparatide, Recombinant, (FORTEO) 600 MCG/2.4ML SOLN Inject 600 mcg into the skin daily. Inject 2.4 ml daily.       No current facility-administered medications for this visit.    ROS: See HPI for pertinent positives and negatives.   Physical Examination  Filed Vitals:   10/04/13 1036  BP: 163/71  Pulse: 58  Resp: 14   Filed Weights   10/04/13 1036  Weight: 150 lb (68.04 kg)   Body mass index is 22.81 kg/(m^2).  General: A&O x 3, WDWN,  Gait: slow, deliberate Eyes: PERRLA, Pulmonary: CTAB, without wheezes , rales or rhonchi Cardiac: regular Rythm , without murmur          Carotid Bruits Left Right   Negative Negative  Aorta: is not palpable Radial pulses: are 2+ and =                           VASCULAR EXAM: Extremities without ischemic changes  without Gangrene; without open wounds.                                                                                                          LE Pulses LEFT RIGHT       FEMORAL   palpable   palpable        POPLITEAL  not  palpable   not palpable       POSTERIOR TIBIAL   palpable   not palpable        DORSALIS PEDIS      ANTERIOR TIBIAL  palpable  faintly palpable        PERONEAL not Palpable   not Palpable    Abdomen: soft, NT, no masses. Skin: no rashes, no ulcers noted. Musculoskeletal: some generalized muscle, RA deformities noted in hands.  Neurologic: A&O X 3; Appropriate Affect ; SENSATION: normal; MOTOR FUNCTION:  moving all extremities equally, motor strength 4/5 throughout. Speech is fluent/normal. CN 2-12 intact.    Non-Invasive Vascular Imaging: DATE: 10/04/2013 ABI: Bilateral ABI's could not be calculated due to non-compressible vessels. Waveform analysis indicates mild arterial insufficiency in the bilateral lower extremities.  Bilateral toe pressures are abnormal. DUPLEX SCAN OF LLE BYPASS: Patent graft with mild irregularity in the distal segment and significant stenosis at the distal anastomosis which was not present  on 04/26/13 LLE Duplex.  ASSESSMENT: DEMARIAN EPPS is a 68 y.o. male who is status post distal left superficial femoral to posterior tibial artery bypass graft with ipsilateral non-reversed, translocated saphenous vein, and left fifth toe amputation with metatarsal head on 01/28/2013.  He has a patent graft with mild irregularity in the distal segment and significant stenosis at the distal anastomosis which was not present on 04/26/13 LLE Duplex. Mild bilateral LE arterial insufficiency. Needs aortogram with run-off , possible intervention of the left LE bypass graft which now has a significant stenosis at the distal anastomosis that was not present on 04/26/13 LLE Duplex. Fortunately he is not a smoker and his DM is under good control  Since it is borderline.  PLAN:  I discussed in depth with the patient the nature of atherosclerosis, and emphasized the importance of maximal medical management including strict control of blood pressure, blood glucose, and lipid levels,  obtaining regular exercise, and continued cessation of smoking.  The patient is aware that without maximal medical management the underlying atherosclerotic disease process will progress, limiting the benefit of any interventions.  Based on the patient's vascular studies and examination, and after discussing with Dr. Myra Gianotti, pt will be scheduled tomorrow for aortogram with run-off, possible intervention.   The patient was given information about PAD including signs, symptoms, treatment, what symptoms should prompt the patient to seek immediate medical care, and risk reduction measures to take.  Charisse March, RN, MSN, FNP-C [facility] Phone: [phone]  Clinic MD: Myra Gianotti  10/04/2013 9:37 AM

## 2013-10-05 NOTE — Telephone Encounter (Addendum)
Message copied by Rosalyn Charters on Tue Oct 05, 2013  3:49 PM ------      Message from: Sharee Pimple      Created: Tue Oct 05, 2013 12:32 PM      Regarding: schedule                   ----- Message -----         From: Erenest Blank, RN         Sent: 10/05/2013  12:05 PM           To: Sharee Pimple, CMA                        ----- Message -----         From: Nada Libman, MD         Sent: 10/05/2013  11:47 AM           To: Reuel Derby, Melene Plan, RN, #            10-05-2013:            Surgeon:  [name]      Procedure Performed:       1.  ultrasound-guided access, right femoral artery       2.  abdominal aortogram       3.  left lower extremity runoff       4.  additional order catheterization       5.  angioplasty left posterior tibial artery                  Please schedule patient for followup with a left lower extremity duplex ultrasound and ankle-brachial indices in 3 months to see Rosalita Chessman ------  l/v/m for patient and mailed appt. letter for 01-03-14 11am lab 12:20 np

## 2013-10-06 ENCOUNTER — Telehealth: Payer: Self-pay | Admitting: Surgery

## 2013-10-06 LAB — POCT ACTIVATED CLOTTING TIME: Activated Clotting Time: 171 seconds

## 2013-10-06 NOTE — Telephone Encounter (Signed)
Message copied by Fredrich Birks on Wed Oct 06, 2013 10:06 AM ------      Message from: Melene Plan      Created: Tue Oct 05, 2013  2:24 PM                   ----- Message -----         From: Nada Libman, MD         Sent: 10/05/2013  11:47 AM           To: Reuel Derby, Melene Plan, RN, #            10-05-2013:            Surgeon:  [name]      Procedure Performed:       1.  ultrasound-guided access, right femoral artery       2.  abdominal aortogram       3.  left lower extremity runoff       4.  additional order catheterization       5.  angioplasty left posterior tibial artery                  Please schedule patient for followup with a left lower extremity duplex ultrasound and ankle-brachial indices in 3 months to see Rosalita Chessman ------

## 2013-10-11 ENCOUNTER — Encounter: Payer: Self-pay | Admitting: Family Medicine

## 2013-10-11 ENCOUNTER — Ambulatory Visit (INDEPENDENT_AMBULATORY_CARE_PROVIDER_SITE_OTHER): Payer: Medicare Other | Admitting: Family Medicine

## 2013-10-11 VITALS — BP 128/70 | Temp 97.9°F | Ht 68.0 in | Wt 151.0 lb

## 2013-10-11 DIAGNOSIS — K219 Gastro-esophageal reflux disease without esophagitis: Secondary | ICD-10-CM

## 2013-10-11 DIAGNOSIS — I679 Cerebrovascular disease, unspecified: Secondary | ICD-10-CM

## 2013-10-11 MED ORDER — PANTOPRAZOLE SODIUM 40 MG PO TBEC: 40.0000 mg | DELAYED_RELEASE_TABLET | Freq: Every day | ORAL | Status: DC

## 2013-10-11 MED ORDER — HYDROCODONE-ACETAMINOPHEN 10-325 MG PO TABS: 1.0000 | ORAL_TABLET | Freq: Four times a day (QID) | ORAL | Status: DC

## 2013-10-11 NOTE — Progress Notes (Signed)
   Subjective:    Patient ID: [name], male    DOB: 1946-09-12, 68 y.o.   MRN: [id number]  HPICheck incision from surgery. Status post left leg arterial dilatation. Groin  Area is red and painful. Overall urinating without difficulty   Needs refill on hydrocodone. States overall the pain medicine still helps is chronic pain. States it definitely needs medicine. No major side effects from it.  Patient having a lot of difficulty with reflux. Omeprazole not helping as much. Use his wife's protonic some had improved symptoms. Requests a prescription for it  Patient states compliance with blood pressure medicine. Trying to watch his salt intake. Not exercising much due to current difficulties.    Review of Systems No chest pain no back pain no abdominal pain no shortness of breath ongoing pain in joints from rheumatoid arthritis ROS otherwise negative    Objective:   Physical Exam Alert no apparent distress. HEENT normal. Lungs clear. Heart regular rate and rhythm. Groin impressive hematoma pulses palpated diffuse tenderness       Assessment & Plan:  Impression hematoma post arterial procedure within normal limits discussed #2 chronic pain discussed #3 hypertension good control. #4 reflux suboptimum plan medications changes noted. Switch to protonic. Pain medicine filled out. Recheck in 3 months. Symptomatic care discussed. WSL

## 2013-10-14 ENCOUNTER — Ambulatory Visit: Payer: No Typology Code available for payment source | Admitting: Family Medicine

## 2013-10-25 ENCOUNTER — Ambulatory Visit: Payer: Medicare Other | Admitting: Surgery

## 2013-10-25 ENCOUNTER — Other Ambulatory Visit (HOSPITAL_COMMUNITY): Payer: Medicare Other

## 2013-10-25 ENCOUNTER — Encounter (HOSPITAL_COMMUNITY): Payer: Medicare Other

## 2013-11-01 ENCOUNTER — Ambulatory Visit: Payer: Medicare Other | Admitting: Surgery

## 2013-11-01 ENCOUNTER — Other Ambulatory Visit (HOSPITAL_COMMUNITY): Payer: Medicare Other

## 2013-11-01 ENCOUNTER — Ambulatory Visit: Payer: Medicare Other | Admitting: Cardiology

## 2013-11-01 ENCOUNTER — Encounter (HOSPITAL_COMMUNITY): Payer: Medicare Other

## 2013-11-03 ENCOUNTER — Other Ambulatory Visit: Payer: Self-pay

## 2013-11-03 HISTORY — PX: SPINE SURGERY: SHX786

## 2013-11-03 MED ORDER — ATENOLOL 25 MG PO TABS: 25.0000 mg | ORAL_TABLET | Freq: Every day | ORAL | Status: DC

## 2013-11-05 ENCOUNTER — Other Ambulatory Visit: Payer: Self-pay

## 2013-11-05 MED ORDER — ATORVASTATIN CALCIUM 80 MG PO TABS: 80.0000 mg | ORAL_TABLET | Freq: Every day | ORAL | Status: DC

## 2013-11-29 ENCOUNTER — Ambulatory Visit: Payer: Medicare Other | Admitting: Cardiology

## 2013-12-01 ENCOUNTER — Encounter: Payer: Self-pay | Admitting: Cardiology

## 2013-12-01 ENCOUNTER — Ambulatory Visit (INDEPENDENT_AMBULATORY_CARE_PROVIDER_SITE_OTHER): Payer: Medicare Other | Admitting: Cardiology

## 2013-12-01 VITALS — BP 176/88 | HR 83 | Ht 68.0 in | Wt 139.0 lb

## 2013-12-01 DIAGNOSIS — I25810 Atherosclerosis of coronary artery bypass graft(s) without angina pectoris: Secondary | ICD-10-CM

## 2013-12-01 DIAGNOSIS — I2510 Atherosclerotic heart disease of native coronary artery without angina pectoris: Secondary | ICD-10-CM

## 2013-12-01 DIAGNOSIS — I10 Essential (primary) hypertension: Secondary | ICD-10-CM

## 2013-12-01 DIAGNOSIS — E785 Hyperlipidemia, unspecified: Secondary | ICD-10-CM

## 2013-12-01 NOTE — Patient Instructions (Signed)
Your physician recommends that you schedule a follow-up appointment in: 6 months with Dr Lurena Joiner will receive a reminder letter two months in advance reminding you to call and schedule your appointment. If you don't receive this letter, please contact our office.  Your physician recommends that you return for lab work this week or next. Fasting Lipids

## 2013-12-01 NOTE — Progress Notes (Addendum)
Clinical Summary [name] is a 68 y.o.male seen today for follow up of the following medical problems.   1. CAD  - prior CABG 1998 at [facility]  - last cath 10/2007 shows LM 95%, occluded LAD, LCX, and RCA. LIMA-LAD patent, SVG-OM patent, SVG to RCA occluded. Echo Jan 2012 LVEF 60-65%, grade II diastolic dysfunction   - denies any chest pain. Denies any significant SOB or DOE. No orthopnea, no PND, occas LE edema  -compliant w/ meds  2. PAD  - prior SFA to popliteal bypass with SVG, with amputation of the left fifth toe  - also histor of left iliac stenting  - recent procedure 09/2013 with lower extremity cath/angio, high grade stenosis tibial artery that was balloon dilated.  - followed by vascular  3. HL  - currently on high dose atorva - 05/2013 most recent lipid panel: TC 242, TG 400, HDL 47 LDL 116  4. HTN - does not check regularly - compliant with meds, reports bp typically elevated when he comes to doctors office  Past Medical History  Diagnosis Date  . 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 Hyperlipidemia   . Hypertension   . PVD (peripheral vascular disease)     Left iliac PCI/stent  . Cerebrovascular disease   . Tobacco abuse, in remission     Remote  . GERD (gastroesophageal reflux disease)   . Hypothyroid   . Allergic rhinitis   . Chronic lung disease     ?  Rheumatoid lung; ?  Adverse reaction to methotrexate  . Schatzki's ring   . Hx of Clostridium difficile infection     Recurrent; associated 40 pound weight loss  . Achalasia   . Hiatal hernia   . Diabetes mellitus     borderline no meds  . Atrial fibrillation   . Chronic back pain   . 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 Cervical spondylosis   . DDD (degenerative disc disease), lumbar   . Nephrolithiasis 2010    s/p lithotripsy  . Pneumonia      Allergies  Allergen Reactions  . Amoxicillin Hives and Other (See Comments)    Hallucinations   .  Ciprofloxacin Other (See Comments)    C-diff  . Doxycycline Nausea And Vomiting  . Levofloxacin Nausea And Vomiting     Current Outpatient Prescriptions  Medication Sig Dispense Refill  . ACCU-CHEK AVIVA PLUS test strip USE TO TEST BLOOD GLUCOSE EVERY DAY  50 each  2  . ALPRAZolam (XANAX) 0.5 MG tablet Take 1 tablet (0.5 mg total) by mouth daily.  30 tablet  5  . aspirin EC 81 MG tablet Take 81 mg by mouth daily.        Marland Kitchen atenolol (TENORMIN) 25 MG tablet Take 1 tablet (25 mg total) by mouth daily.  30 tablet  4  . atorvastatin (LIPITOR) 80 MG tablet Take 1 tablet (80 mg total) by mouth daily.  30 tablet  3  . B Complex Vitamins (VITAMIN B COMPLEX PO) Take 1 tablet by mouth daily.      . cholecalciferol (VITAMIN D) 400 UNITS TABS Take 400 Units by mouth daily.       . diphenoxylate-atropine (LOMOTIL) 2.5-0.025 MG per tablet Take 1 tablet by mouth 4 (four) times daily as needed for diarrhea or loose stools.  30 tablet  0  . folic acid (FOLVITE) 1 MG tablet Take 1 mg by mouth daily.        Marland Kitchen  HYDROcodone-acetaminophen (NORCO) 10-325 MG per tablet Take 1 tablet by mouth 4 (four) times daily.  120 tablet  0  . ibuprofen (ADVIL,MOTRIN) 800 MG tablet Take 800 mg by mouth 3 (three) times daily.      Marland Kitchen leflunomide (ARAVA) 20 MG tablet Take 20 mg by mouth daily.      Marland Kitchen levothyroxine (SYNTHROID, LEVOTHROID) 100 MCG tablet Take 1 tablet (100 mcg total) by mouth daily.  90 tablet  5  . lisinopril (PRINIVIL,ZESTRIL) 20 MG tablet Take 20 mg by mouth daily.      Marland Kitchen loperamide (IMODIUM) 2 MG capsule Take 4 mg by mouth as needed for diarrhea or loose stools.      . Melatonin 3 MG TABS Take 1 tablet by mouth at bedtime.      . Multiple Vitamin (MULTIVITAMIN) capsule Take 1 capsule by mouth daily.      . Omega-3 Fatty Acids (FISH OIL) 500 MG CAPS Take 1 capsule by mouth daily.      . pantoprazole (PROTONIX) 40 MG tablet Take 1 tablet (40 mg total) by mouth daily.  30 tablet  11  . predniSONE (DELTASONE) 10 MG  tablet Take 1 tablet (10 mg total) by mouth daily.  30 tablet  5  . tamsulosin (FLOMAX) 0.4 MG CAPS capsule Take 1 capsule (0.4 mg total) by mouth daily after supper.  30 capsule  11  . Teriparatide, Recombinant, (FORTEO) 600 MCG/2.4ML SOLN Inject 600 mcg into the skin daily. Inject 2.4 ml daily.      . Vitamin D, Ergocalciferol, (DRISDOL) 50000 UNITS CAPS capsule Take 50,000 Units by mouth every 7 (seven) days. Take on Monday.       No current facility-administered medications for this visit.     Past Surgical History  Procedure Laterality Date  . Coronary artery bypass graft      x6 In 1998  . Total knee arthroplasty      Right  . Ankle fusion      Left  . Wrist fusion      Right  . Shoulder surgery    . Colonoscopy  09/12/2011    Dr. Elly Modena hemorrhoids, normal colon and distal terminal ileum. Random bx negative. Stool for CDiff positive.  . Esophagogastroduodenoscopy  09/13/2011    Dr. Lyndle Herrlich plawues mid-esophagus KOH negative. Distal esophageal ring and ulcer. Mild gastritis. duodenal diverticulum. Savaory dilation 30mm.   Gaspar Bidding dilation  09/13/2011  . Esophageal manometry  09/30/2011    Procedure: ESOPHAGEAL MANOMETRY (EM);  Surgeon: Rob Bunting, MD;  Location: [facility];  Service: Endoscopy;  Laterality: N/A;  . Esophagogastroduodenoscopy  09/12/2011    Dr. Rinaldo Ratel rings s/p dilation  . Cardiac surgery    . Esophagomyotomy  11/12/11    [facility]- with DOR antireflux surgery  . Esophagogastroduodenoscopy  06/25/2012    Procedure: ESOPHAGOGASTRODUODENOSCOPY (EGD);  Surgeon: Corbin Ade, MD;  Location: [facility];  Service: Endoscopy;  Laterality: N/A;  7:30  . Savory dilation  06/25/2012    Procedure: SAVORY DILATION;  Surgeon: Corbin Ade, MD;  Location: [facility];  Service: Endoscopy;  Laterality: N/A;  Elease Hashimoto dilation  06/25/2012    Procedure: Elease Hashimoto DILATION;  Surgeon: Corbin Ade, MD;  Location: [facility];  Service:  Endoscopy;  Laterality: N/A;  . Ankle fusion  09/01/2012    Procedure: ANKLE FUSION;  Surgeon: Valeria Batman, MD;  Location: [facility];  Service: Orthopedics;  Laterality: Left;  Take down of angulated tibial/fibula Fractures  .  Femoral-tibial bypass graft Left 01/28/2013    Procedure: BYPASS GRAFT FEMORAL-TIBIAL ARTERY;  Surgeon: Nada Libman, MD;  Location: [facility] OR;  Service: Vascular;  Laterality: Left;  Ultrasound Guided  . Amputation Left 01/28/2013    Procedure: AMPUTATION LEFT 5TH TOE;  Surgeon: Nada Libman, MD;  Location: [facility] OR;  Service: Vascular;  Laterality: Left;  . Joint replacement Right 1999    Knee     Allergies  Allergen Reactions  . Amoxicillin Hives and Other (See Comments)    Hallucinations   . Ciprofloxacin Other (See Comments)    C-diff  . Doxycycline Nausea And Vomiting  . Levofloxacin Nausea And Vomiting      Family History  Problem Relation Age of Onset  . Stomach cancer Mother 40  . Cancer Mother     Stomach  . Heart disease Mother   . Heart attack Mother   . Heart attack Father   . Heart disease Father   . Heart disease Brother   . Leukemia Brother   . Lung disease Son     infant  . Lung disease Daughter     infant     Social History Mr. Douthit reports that he quit smoking about 48 years ago. His smoking use included Cigarettes. He has a 2.5 pack-year smoking history. He has quit using smokeless tobacco. Mr. Choung reports that he does not drink alcohol.   Review of Systems CONSTITUTIONAL: No weight loss, fever, chills, weakness or fatigue.  HEENT: Eyes: No visual loss, blurred vision, double vision or yellow sclerae.No hearing loss, sneezing, congestion, runny nose or sore throat.  SKIN: No rash or itching.  CARDIOVASCULAR: per HPI RESPIRATORY: No shortness of breath, cough or sputum.  GASTROINTESTINAL: No anorexia, nausea, vomiting or diarrhea. No abdominal pain or blood.  GENITOURINARY: No burning on urination, no  polyuria NEUROLOGICAL: No headache, dizziness, syncope, paralysis, ataxia, numbness or tingling in the extremities. No change in bowel or bladder control.  MUSCULOSKELETAL: No muscle, back pain, joint pain or stiffness.  LYMPHATICS: No enlarged nodes. No history of splenectomy.  PSYCHIATRIC: No history of depression or anxiety.  ENDOCRINOLOGIC: No reports of sweating, cold or heat intolerance. No polyuria or polydipsia.  Marland Kitchen   Physical Examination p 83 bp 134/70 Wt 139 lbs BMI 21 Gen: resting comfortably, no acute distress HEENT: no scleral icterus, pupils equal round and reactive, no palptable cervical adenopathy,  CV: RRR, no m/r/g, no JVD, mild bilateral carotid bruits Resp: Clear to auscultation bilaterally GI: abdomen is soft, non-tender, non-distended, normal bowel sounds, no hepatosplenomegaly MSK: extremities are warm, no edema.  Skin: warm, no rash Neuro:  no focal deficits Psych: appropriate affect   Diagnostic Studies 10/2007 Cath  HEMODYNAMIC RESULTS: Left ventricle 139/16 mmHg. Aorta 136/67 mmHg.  ANGIOGRAPHIC FINDINGS:  1. Left main coronary artery is severely diseased to approximately 95%  in diffuse fashion. This has progressed compared to the previous  angiogram. This vessel gives rise to the left anterior descending,  a very small ramus intermedius, and circumflex vessels.  2. The left anterior descending is occluded proximally.  3. The circumflex coronary artery is occluded proximally.  4. The right coronary artery is occluded proximally just after the  takeoff of a right ventricular marginal Yi Falletta. There are some  right to right bridging collaterals noted filling the right  coronary artery nearly down to the distal portion.  5. The saphenous vein graft to the first and second obtuse marginals  is widely patent. There is an  area of 30-40% stenosis within the  proximal vessel although not clearly flow-limiting. The  anastomotic sites look to be intact.  6.  There is known occlusion of the saphenous vein graft of the right  coronary system.  7. The LIMA to the left anterior descending and diagonal is widely  patent. Anastomotic sites are intact. Of note, the distal left  anterior descending is diffusely diseased, and there is an apical  90% stenosis noted. The diagonal Maple Odaniel is also diffusely diseased  and relatively small.  8. There is some left-to-right collateralization of the distal right  coronary artery system, although not well-developed.  Ventriculography is performed in the RAO projection and reveals an  ejection fraction of approximately 60% with no significant wall motion  abnormality and no mitral regurgitation.  DIAGNOSES:  1. Severe native coronary artery disease as outlined. There has been  progression in left main disease, although continued occlusion  proximally of the left anterior descending and circumflex vessels.  The distal left anterior descending is diffusely diseased, and  there is some left-to-right collateralization of the occluded right  coronary artery although not to a large degree.  2. Patent left internal mammary artery to left anterior descending and  diagonal, patent saphenous vein graft to the first and second  obtuse marginal with 30-40% proximal vein graft stenosis (not  clearly flow-limiting), and known occlusion of the saphenous vein  graft to posterior descending and right coronary artery.  3. Left ventricular ejection fraction of approximately 60% with no  large focal wall motion abnormality and a left ventricular end-  diastolic pressure of 16 mmHg. No significant mitral regurgitation  is noted.  DISCUSSION: No obvious revascularization options noted at this time.  Suspect that angina may well be related to progression in the patient's  native disease rather than new hemodynamically significant occlusions  within his remaining 2 bypass grafts which are widely patent. There is  30-40% stenosis  within the saphenous vein graft of the obtuse marginal  system, although this does not appear to be flow-limiting. At this  point, medical therapy seems to be the best option. I discussed this  with the patient, his family, and with Dr. [name].   Echo 09/2010: LVEF 60-65%, grade II diastolic dysfunction   04/2013 ABI: right 1.39 left 1.46  Jan 2015 LE Angiogram  Findings:  Aortogram: No significant suprarenal aortic stenosis is identified. There is no evidence of renal artery stenosis. The infrarenal abdominal aorta is widely patent. The stent within the left common iliac artery is widely patent. The left external iliac artery is widely patent. The right iliac system is widely patent.  Left Lower Extremity: The left common femoral and profunda femoral arteries are widely patent. The left superficial femoral artery is patent down to the adductor canal where it occludes. There is a bypass graft originating from the distal superficial femoral artery. The proximal anastomosis is widely patent. The vein bypass graft is widely patent. The distal anastomosis shows mild narrowing and just beyond the anastomosis is a high-grade stenosis of approximately 80%. The posterior tibial artery isn't patent down across the ankle.  Intervention: After the above images were acquired, the decision was made to proceed with intervention. Over an 035 wire, a 6 French sheath was advanced into the left superficial femoral artery. The patient was fully heparinized. A [place] core wire was easily advanced across the distal anastomosis. I selected a 3 x 20 Fox SV balloon and perform primary balloon angioplasty of the distal  anastomosis and posterior tibial artery. The balloon was taken 14 atmospheres for 2 minutes. Completion angiography revealed resolution of the stenosis. At this point catheters and wires were removed. The sheath was withdrawn to the right external iliac artery. The patient was taken to the holding area for  sheath pull once his coagulation profile corrects.  Impression:  #1 high-grade stenosis at the distal anastomosis and in the native posterior tibial artery just beyond the distal anastomosis. This was successfully dilated using a 3 x 20 balloon with excellent results.  12/01/13 Clinic EKG NSR, LVH, LAE  Assessment and Plan  1. CAD  - no current symptoms  - continue risk factor modification, continue current meds   2. PAD  - continue follow up with vascular  3. Carotid bruits  - No current symptoms, recent US 06/2013 with no significant stenosis  4. Hyperlipidemia  - recently changed from prava to atorva because LDL was not at goal. He reports history of myalgias previously on lipitor but has been tolerating fine this time around.  - will repeat lipid panel  5. HTN - history of white coat HTN, elevated on initial vitals. Repeat manual bp at goal - continue current meds, will bring bp log next visit.   F/u 6 months       Antoine Poche, M.D., F.A.C.C.

## 2013-12-06 LAB — LIPID PANEL
CHOL/HDL RATIO: 4.5 ratio
CHOLESTEROL: 198 mg/dL (ref 0–200)
HDL: 44 mg/dL (ref >39)
LDL CALC: 90 mg/dL (ref 0–99)
TRIGLYCERIDES: 320 mg/dL — AB (ref <150)
VLDL: 64 mg/dL — AB (ref 0–40)

## 2013-12-13 ENCOUNTER — Other Ambulatory Visit: Payer: Self-pay | Admitting: Family Medicine

## 2013-12-13 NOTE — Telephone Encounter (Signed)
Ok plus five monthly ref 

## 2013-12-17 ENCOUNTER — Other Ambulatory Visit: Payer: Self-pay | Admitting: Neurosurgery

## 2013-12-17 DIAGNOSIS — M47817 Spondylosis without myelopathy or radiculopathy, lumbosacral region: Secondary | ICD-10-CM

## 2013-12-25 ENCOUNTER — Ambulatory Visit
Admission: RE | Admit: 2013-12-25 | Discharge: 2013-12-25 | Disposition: A | Discharge status: Home / Self Care | Payer: Medicare Other | Source: Ambulatory Visit | Attending: Neurosurgery | Admitting: Neurosurgery

## 2013-12-25 DIAGNOSIS — M47817 Spondylosis without myelopathy or radiculopathy, lumbosacral region: Secondary | ICD-10-CM

## 2013-12-31 ENCOUNTER — Encounter: Payer: Self-pay | Admitting: Family

## 2014-01-03 ENCOUNTER — Encounter: Payer: Self-pay | Admitting: Family

## 2014-01-03 ENCOUNTER — Ambulatory Visit (HOSPITAL_COMMUNITY)
Admit: 2014-01-03 | Discharge: 2014-01-03 | Disposition: A | Discharge status: Home / Self Care | Payer: Medicare Other | Source: Ambulatory Visit | Attending: Family | Admitting: Family

## 2014-01-03 ENCOUNTER — Other Ambulatory Visit: Payer: Self-pay | Admitting: Family Medicine

## 2014-01-03 ENCOUNTER — Ambulatory Visit (INDEPENDENT_AMBULATORY_CARE_PROVIDER_SITE_OTHER)
Admit: 2014-01-03 | Discharge: 2014-01-03 | Disposition: A | Discharge status: Home / Self Care | Payer: Medicare Other | Attending: Family | Admitting: Family

## 2014-01-03 ENCOUNTER — Ambulatory Visit (INDEPENDENT_AMBULATORY_CARE_PROVIDER_SITE_OTHER): Payer: Medicare Other | Admitting: Family

## 2014-01-03 VITALS — BP 124/71 | HR 61 | Resp 16 | Ht 68.0 in | Wt 146.0 lb

## 2014-01-03 DIAGNOSIS — I739 Peripheral vascular disease, unspecified: Secondary | ICD-10-CM

## 2014-01-03 DIAGNOSIS — Z48812 Encounter for surgical aftercare following surgery on the circulatory system: Secondary | ICD-10-CM | POA: Insufficient documentation

## 2014-01-03 NOTE — Patient Instructions (Signed)
Peripheral Vascular Disease Peripheral Vascular Disease (PVD), also called Peripheral Arterial Disease (PAD), is a circulation problem caused by cholesterol (atherosclerotic plaque) deposits in the arteries. PVD commonly occurs in the lower extremities (legs) but it can occur in other areas of the body, such as your arms. The cholesterol buildup in the arteries reduces blood flow which can cause pain and other serious problems. The presence of PVD can place a person at risk for Coronary Artery Disease (CAD).  CAUSES  Causes of PVD can be many. It is usually associated with more than one risk factor such as:   High Cholesterol.  Smoking.  Diabetes.  Lack of exercise or inactivity.  High blood pressure (hypertension).  Obesity.  Family history. SYMPTOMS   When the lower extremities are affected, patients with PVD may experience:  Leg pain with exertion or physical activity. This is called INTERMITTENT CLAUDICATION. This may present as cramping or numbness with physical activity. The location of the pain is associated with the level of blockage. For example, blockage at the abdominal level (distal abdominal aorta) may result in buttock or hip pain. Lower leg arterial blockage may result in calf pain.  As PVD becomes more severe, pain can develop with less physical activity.  In people with severe PVD, leg pain may occur at rest.  Other PVD signs and symptoms:  Leg numbness or weakness.  Coldness in the affected leg or foot, especially when compared to the other leg.  A change in leg color.  Patients with significant PVD are more prone to ulcers or sores on toes, feet or legs. These may take longer to heal or may reoccur. The ulcers or sores can become infected.  If signs and symptoms of PVD are ignored, gangrene may occur. This can result in the loss of toes or loss of an entire limb.  Not all leg pain is related to PVD. Other medical conditions can cause leg pain such  as:  Blood clots (embolism) or Deep Vein Thrombosis.  Inflammation of the blood vessels (vasculitis).  Spinal stenosis. DIAGNOSIS  Diagnosis of PVD can involve several different types of tests. These can include:  Pulse Volume Recording Method (PVR). This test is simple, painless and does not involve the use of X-rays. PVR involves measuring and comparing the blood pressure in the arms and legs. An ABI (Ankle-Brachial Index) is calculated. The normal ratio of blood pressures is 1. As this number becomes smaller, it indicates more severe disease.  < 0.95  indicates significant narrowing in one or more leg vessels.  <0.8 there will usually be pain in the foot, leg or buttock with exercise.  <0.4 will usually have pain in the legs at rest.  <0.25  usually indicates limb threatening PVD.  Doppler detection of pulses in the legs. This test is painless and checks to see if you have a pulses in your legs/feet.  A dye or contrast material (a substance that highlights the blood vessels so they show up on x-ray) may be given to help your caregiver better see the arteries for the following tests. The dye is eliminated from your body by the kidney's. Your caregiver may order blood work to check your kidney function and other laboratory values before the following tests are performed:  Magnetic Resonance Angiography (MRA). An MRA is a picture study of the blood vessels and arteries. The MRA machine uses a large magnet to produce images of the blood vessels.  Computed Tomography Angiography (CTA). A CTA is a   specialized x-ray that looks at how the blood flows in your blood vessels. An IV may be inserted into your arm so contrast dye can be injected.  Angiogram. Is a procedure that uses x-rays to look at your blood vessels. This procedure is minimally invasive, meaning a small incision (cut) is made in your groin. A small tube (catheter) is then inserted into the artery of your groin. The catheter is  guided to the blood vessel or artery your caregiver wants to examine. Contrast dye is injected into the catheter. X-rays are then taken of the blood vessel or artery. After the images are obtained, the catheter is taken out. TREATMENT  Treatment of PVD involves many interventions which may include:  Lifestyle changes:  Quitting smoking.  Exercise.  Following a low fat, low cholesterol diet.  Control of diabetes.  Foot care is very important to the PVD patient. Good foot care can help prevent infection.  Medication:  Cholesterol-lowering medicine.  Blood pressure medicine.  Anti-platelet drugs.  Certain medicines may reduce symptoms of Intermittent Claudication.  Interventional/Surgical options:  Angioplasty. An Angioplasty is a procedure that inflates a balloon in the blocked artery. This opens the blocked artery to improve blood flow.  Stent Implant. A wire mesh tube (stent) is placed in the artery. The stent expands and stays in place, allowing the artery to remain open.  Peripheral Bypass Surgery. This is a surgical procedure that reroutes the blood around a blocked artery to help improve blood flow. This type of procedure may be performed if Angioplasty or stent implants are not an option. SEEK IMMEDIATE MEDICAL CARE IF:   You develop pain or numbness in your arms or legs.  Your arm or leg turns cold, becomes blue in color.  You develop redness, warmth, swelling and pain in your arms or legs. MAKE SURE YOU:   Understand these instructions.  Will watch your condition.  Will get help right away if you are not doing well or get worse. Document Released: 10/17/2004 Document Revised: 12/02/2011 Document Reviewed: 09/13/2008 [employer] Patient Information 2014 [employer].   Stroke Prevention Some medical conditions and behaviors are associated with an increased chance of having a stroke. You may prevent a stroke by making healthy choices and managing medical  conditions. HOW CAN I REDUCE MY RISK OF HAVING A STROKE?   Stay physically active. Get at least 30 minutes of activity on most or all days.  Do not smoke. It may also be helpful to avoid exposure to secondhand smoke.  Limit alcohol use. Moderate alcohol use is considered to be:  No more than 2 drinks per day for men.  No more than 1 drink per day for nonpregnant women.  Eat healthy foods. This involves  Eating 5 or more servings of fruits and vegetables a day.  Following a diet that addresses high blood pressure (hypertension), high cholesterol, diabetes, or obesity.  Manage your cholesterol levels.  A diet low in saturated fat, trans fat, and cholesterol and high in fiber may control cholesterol levels.  Take any prescribed medicines to control cholesterol as directed by your health care provider.  Manage your diabetes.  A controlled-carbohydrate, controlled-sugar diet is recommended to manage diabetes.  Take any prescribed medicines to control diabetes as directed by your health care provider.  Control your hypertension.  A low-salt (sodium), low-saturated fat, low-trans fat, and low-cholesterol diet is recommended to manage hypertension.  Take any prescribed medicines to control hypertension as directed by your health care provider.    Maintain a healthy weight.  A reduced-calorie, low-sodium, low-saturated fat, low-trans fat, low-cholesterol diet is recommended to manage weight.  Stop drug abuse.  Avoid taking birth control pills.  Talk to your health care provider about the risks of taking birth control pills if you are over 35 years old, smoke, get migraines, or have ever had a blood clot.  Get evaluated for sleep disorders (sleep apnea).  Talk to your health care provider about getting a sleep evaluation if you snore a lot or have excessive sleepiness.  Take medicines as directed by your health care provider.  For some people, aspirin or blood thinners  (anticoagulants) are helpful in reducing the risk of forming abnormal blood clots that can lead to stroke. If you have the irregular heart rhythm of atrial fibrillation, you should be on a blood thinner unless there is a good reason you cannot take them.  Understand all your medicine instructions.  Make sure that other other conditions (such as anemia or atherosclerosis) are addressed. SEEK IMMEDIATE MEDICAL CARE IF:   You have sudden weakness or numbness of the face, arm, or leg, especially on one side of the body.  Your face or eyelid droops to one side.  You have sudden confusion.  You have trouble speaking (aphasia) or understanding.  You have sudden trouble seeing in one or both eyes.  You have sudden trouble walking.  You have dizziness.  You have a loss of balance or coordination.  You have a sudden, severe headache with no known cause.  You have new chest pain or an irregular heartbeat. Any of these symptoms may represent a serious problem that is an emergency. Do not wait to see if the symptoms will go away. Get medical help at once. Call your local emergency services  (911 in [place] Do not drive yourself to the hospital. Document Released: 10/17/2004 Document Revised: 06/30/2013 Document Reviewed: 03/12/2013 [employer] Patient Information 2014 [employer].  

## 2014-01-03 NOTE — Progress Notes (Signed)
[facility] [place] HISTORY AND PHYSICAL -PAD  History of Present Illness [name] is a 68 y.o. male patient of Dr. Myra Gianotti who is status post distal left superficial femoral to posterior tibial artery bypass graft with ipsilateral non-reversed, translocated saphenous vein, and left fifth toe amputation with metatarsal head on 01/28/2013. This was done in the setting of an ischemic left fifth toe.  On 10/05/2013 Dr. Myra Gianotti performed the following: Procedure Performed:  1. ultrasound-guided access, right femoral artery  2. abdominal aortogram  3. left lower extremity runoff  4. additional order catheterization  5. angioplasty left posterior tibial artery  Patient returns today for PAD and graft surveillance.  Since the 01/13/2015angioplasty of his left posterior tibial artery, patient state he has had no further resting pain in his left leg. He denies claudication symptoms in his legs with walking, denies non healing wounds.    Reports that he has 2 pinched nerves in his lumbar spine that limits his walking due to pain, his worst complaint is right low back pain with radiculopathy and RA pain.  Pt Diabetic: "borderline", no DM medications  Pt smoker: former smoker, quit in 1967  Pt meds include:  Statin :Yes  Betablocker: Yes  ASA: Yes  Other anticoagulants/antiplatelets: no   Past Medical History  Diagnosis Date  . 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 Hyperlipidemia   . Hypertension   . PVD (peripheral vascular disease)     Left iliac PCI/stent  . Cerebrovascular disease   . Tobacco abuse, in remission     Remote  . GERD (gastroesophageal reflux disease)   . Hypothyroid   . Allergic rhinitis   . Chronic lung disease     ?  Rheumatoid lung; ?  Adverse reaction to methotrexate  . Schatzki's ring   . Hx of Clostridium difficile infection     Recurrent; associated 40 pound weight loss  . Achalasia   . Hiatal hernia    . Diabetes mellitus     borderline no meds  . Atrial fibrillation   . Chronic back pain   . 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 Cervical spondylosis   . DDD (degenerative disc disease), lumbar   . Nephrolithiasis 2010    s/p lithotripsy  . Pneumonia     Social History History  Substance Use Topics  . Smoking status: Former Smoker -- 0.50 packs/day for 5 years    Types: Cigarettes    Quit date: 09/23/1965  . Smokeless tobacco: Former Neurosurgeon  . Alcohol Use: No    Family History Family History  Problem Relation Age of Onset  . Stomach cancer Mother 28  . Cancer Mother     Stomach  . Heart disease Mother   . Heart attack Mother   . Heart attack Father   . Heart disease Father   . Heart disease Brother   . Leukemia Brother   . Lung disease Son     infant  . Lung disease Daughter     infant    Past Surgical History  Procedure Laterality Date  . Coronary artery bypass graft      x6 In 1998  . Total knee arthroplasty      Right  . Ankle fusion      Left  . Wrist fusion      Right  . Shoulder surgery    . Colonoscopy  09/12/2011    Dr. Elly Modena hemorrhoids, normal  colon and distal terminal ileum. Random bx negative. Stool for CDiff positive.  . Esophagogastroduodenoscopy  09/13/2011    Dr. Lyndle Herrlich plawues mid-esophagus KOH negative. Distal esophageal ring and ulcer. Mild gastritis. duodenal diverticulum. Savaory dilation 70mm.   Gaspar Bidding dilation  09/13/2011  . Esophageal manometry  09/30/2011    Procedure: ESOPHAGEAL MANOMETRY (EM);  Surgeon: Rob Bunting, MD;  Location: [facility];  Service: Endoscopy;  Laterality: N/A;  . Esophagogastroduodenoscopy  09/12/2011    Dr. Rinaldo Ratel rings s/p dilation  . Cardiac surgery    . Esophagomyotomy  11/12/11    [facility]- with DOR antireflux surgery  . Esophagogastroduodenoscopy  06/25/2012    Procedure: ESOPHAGOGASTRODUODENOSCOPY (EGD);  Surgeon: Corbin Ade, MD;  Location: [facility]  SU[facility];  Service: Endoscopy;  Laterality: N/A;  7:30  . Savory dilation  06/25/2012    Procedure: SAVORY DILATION;  Surgeon: Corbin Ade, MD;  Location: [facility];  Service: Endoscopy;  Laterality: N/A;  Elease Hashimoto dilation  06/25/2012    Procedure: Elease Hashimoto DILATION;  Surgeon: Corbin Ade, MD;  Location: [facility];  Service: Endoscopy;  Laterality: N/A;  . Ankle fusion  09/01/2012    Procedure: ANKLE FUSION;  Surgeon: Valeria Batman, MD;  Location: [facility] OR;  Service: Orthopedics;  Laterality: Left;  Take down of angulated tibial/fibula Fractures  . Femoral-tibial bypass graft Left 01/28/2013    Procedure: BYPASS GRAFT FEMORAL-TIBIAL ARTERY;  Surgeon: Nada Libman, MD;  Location[facility] OR;  Service: Vascular;  Laterality: Left;  Ultrasound Guided  . Amputation Left 01/28/2013    Procedure: AMPUTATION LEFT 5TH TOE;  Surgeon: Nada Libman, MD;  Location: [facility] OR;  Service: Vascular;  Laterality: Left;  . Joint replacement Right 1999    Knee  . Spine surgery  11-03-13    Allergies  Allergen Reactions  . Amoxicillin Hives and Other (See Comments)    Hallucinations   . Ciprofloxacin Other (See Comments)    C-diff  . Doxycycline Nausea And Vomiting  . Levofloxacin Nausea And Vomiting    Current Outpatient Prescriptions  Medication Sig Dispense Refill  . ACCU-CHEK AVIVA PLUS test strip USE TO TEST BLOOD GLUCOSE EVERY DAY  50 each  2  . ALPRAZolam (XANAX) 0.5 MG tablet TAKE 1 TABLET BY MOUTH EVERY DAY  30 tablet  5  . aspirin EC 81 MG tablet Take 81 mg by mouth daily.        Marland Kitchen atenolol (TENORMIN) 25 MG tablet Take 1 tablet (25 mg total) by mouth daily.  30 tablet  4  . atorvastatin (LIPITOR) 80 MG tablet Take 1 tablet (80 mg total) by mouth daily.  30 tablet  3  . B Complex Vitamins (VITAMIN B COMPLEX PO) Take 1 tablet by mouth daily.      . diphenoxylate-atropine (LOMOTIL) 2.5-0.025 MG per tablet Take 1 tablet by mouth 4 (four) times daily as needed for diarrhea or loose stools.  30  tablet  0  . Dutasteride-Tamsulosin HCl (JALYN) 0.5-0.4 MG CAPS Take by mouth daily.      . folic acid (FOLVITE) 1 MG tablet Take 1 mg by mouth daily.        Marland Kitchen HYDROcodone-acetaminophen (NORCO) 10-325 MG per tablet Take 1 tablet by mouth 4 (four) times daily.  120 tablet  0  . ibuprofen (ADVIL,MOTRIN) 800 MG tablet Take 800 mg by mouth 3 (three) times daily.      Marland Kitchen leflunomide (ARAVA) 20 MG tablet Take 20 mg by mouth daily.      Marland Kitchen  levothyroxine (SYNTHROID, LEVOTHROID) 100 MCG tablet TAKE 1 TABLET BY MOUTH DAILY  90 tablet  1  . lisinopril (PRINIVIL,ZESTRIL) 20 MG tablet Take 20 mg by mouth daily.      Marland Kitchen loperamide (IMODIUM) 2 MG capsule Take 4 mg by mouth as needed for diarrhea or loose stools.      . Melatonin 3 MG TABS Take 1 tablet by mouth at bedtime.      . Multiple Vitamin (MULTIVITAMIN) capsule Take 1 capsule by mouth daily.      . Omega-3 Fatty Acids (FISH OIL) 500 MG CAPS Take 1 capsule by mouth daily.      . pantoprazole (PROTONIX) 40 MG tablet Take 1 tablet (40 mg total) by mouth daily.  30 tablet  11  . predniSONE (DELTASONE) 10 MG tablet Take 1 tablet (10 mg total) by mouth daily.  30 tablet  5  . tamsulosin (FLOMAX) 0.4 MG CAPS capsule Take 1 capsule (0.4 mg total) by mouth daily after supper.  30 capsule  11  . Teriparatide, Recombinant, (FORTEO) 600 MCG/2.4ML SOLN Inject 600 mcg into the skin daily. Inject 2.4 ml daily.      . Vitamin D, Ergocalciferol, (DRISDOL) 50000 UNITS CAPS capsule Take 50,000 Units by mouth every 7 (seven) days. Take on Monday.       No current facility-administered medications for this visit.    ROS: See HPI for pertinent positives and negatives.   Physical Examination  Filed Vitals:   01/03/14 1213  BP: 124/71  Pulse: 61  Resp: 16   Filed Weights   01/03/14 1213  Weight: 146 lb (66.225 kg)   Body mass index is 22.2 kg/(m^2).  General: A&O x 3, WDWN,  Gait: slow, deliberate  Eyes: PERRLA,  Pulmonary: CTAB, without wheezes , rales or  rhonchi  Cardiac: regular Rythm , without murmur  Carotid Bruits  Left  Right    Negative  Positive   Aorta: is not palpable  Radial pulses: are 2+ and =  VASCULAR EXAM:  Extremities without ischemic changes  without Gangrene; without open wounds.  LE Pulses  LEFT  RIGHT   FEMORAL  palpable  palpable   POPLITEAL  not palpable  not palpable   POSTERIOR TIBIAL  Not palpable  not palpable   DORSALIS PEDIS  ANTERIOR TIBIAL  Not palpable  not palpable   PERONEAL  not Palpable  not Palpable   Abdomen: soft, NT, no masses.  Skin: no rashes, no ulcers noted.  Musculoskeletal: some generalized muscle, RA deformities noted in hands.  Neurologic: A&O X 3; Appropriate Affect ; SENSATION: normal; MOTOR FUNCTION: moving all extremities equally, motor strength 4/5 throughout. Speech is fluent/normal. CN 2-12 intact.   Non-Invasive Vascular Imaging: DATE: 01/03/2014 ABI: RIGHT 1.28, Waveforms: bi and monophasic;  LEFT 1.00, Waveforms: biphasic DUPLEX SCAN OF BYPASS: Patent left distal SFA PTA graft  Previous carotid Duplex (01/06/2014): Bilateral ICA's with <40% stenosis. Bilateral external carotid arteries are patent. Bilateral vertebral arteries patent and antegrade.; unchanged since 11/30/2007.   ASSESSMENT: [name] is a 68 y.o. male  who is status post distal left superficial femoral to posterior tibial artery bypass graft with ipsilateral non-reversed, translocated saphenous vein, and left fifth toe amputation with metatarsal head on 01/28/2013. This was done in the setting of an ischemic left fifth toe.  He is also s/p angioplasty left posterior tibial artery on 10/05/2013. Since the 01/13/2015angioplasty of his left posterior tibial artery, patient state he has had no further resting pain  in his left leg. He denies claudication symptoms in his legs with walking, denies non healing wounds.  Right carotid bruit auscultated today that was not auscultated on 10/04/2013, will add carotid  Duplex when he returns in 3 months for LE arterial evaluation.   PLAN:  I discussed in depth with the patient the nature of atherosclerosis, and emphasized the importance of maximal medical management including strict control of blood pressure, blood glucose, and lipid levels, obtaining regular exercise, and continued cessation of smoking.  The patient is aware that without maximal medical management the underlying atherosclerotic disease process will progress, limiting the benefit of any interventions.  Based on the patient's vascular studies and examination, pt will return to clinic in 3 months for ABI's, left LE arterial Duplex, and carotid Duplex.  The patient was given information about PAD including signs, symptoms, treatment, what symptoms should prompt the patient to seek immediate medical care, and risk reduction measures to take.  Charisse March, RN, MSN, FNP-C [facility] [place] Phone: [phone]  Clinic MD: Myra Gianotti  01/03/2014 12:30 PM

## 2014-01-04 NOTE — Addendum Note (Signed)
Addended by: Adria Dill L on: 01/04/2014 03:54 PM   Modules accepted: Orders

## 2014-01-10 ENCOUNTER — Encounter: Payer: Self-pay | Admitting: Family Medicine

## 2014-01-10 ENCOUNTER — Ambulatory Visit (INDEPENDENT_AMBULATORY_CARE_PROVIDER_SITE_OTHER): Payer: Medicare Other | Admitting: Family Medicine

## 2014-01-10 VITALS — BP 118/70 | Ht 68.0 in | Wt 147.0 lb

## 2014-01-10 DIAGNOSIS — E119 Type 2 diabetes mellitus without complications: Secondary | ICD-10-CM

## 2014-01-10 DIAGNOSIS — N400 Enlarged prostate without lower urinary tract symptoms: Secondary | ICD-10-CM

## 2014-01-10 DIAGNOSIS — E785 Hyperlipidemia, unspecified: Secondary | ICD-10-CM

## 2014-01-10 DIAGNOSIS — G894 Chronic pain syndrome: Secondary | ICD-10-CM | POA: Insufficient documentation

## 2014-01-10 DIAGNOSIS — I739 Peripheral vascular disease, unspecified: Secondary | ICD-10-CM

## 2014-01-10 DIAGNOSIS — I2510 Atherosclerotic heart disease of native coronary artery without angina pectoris: Secondary | ICD-10-CM

## 2014-01-10 LAB — POCT GLYCOSYLATED HEMOGLOBIN (HGB A1C): Hemoglobin A1C: 6

## 2014-01-10 MED ORDER — HYDROCODONE-ACETAMINOPHEN 10-325 MG PO TABS: 1.0000 | ORAL_TABLET | Freq: Four times a day (QID) | ORAL | Status: DC

## 2014-01-10 NOTE — Progress Notes (Signed)
   Subjective:    Patient ID: [name], male    DOB: 1946/03/28, 68 y.o.   MRN: [id number]  HPI Comments: Needs a refill on hydrocodone.  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   Diabetes He presents for his follow-up diabetic visit. He has type 2 diabetes mellitus. There are no hypoglycemic associated symptoms. There are no diabetic associated symptoms. Current diabetic treatment includes diet. He never participates in exercise. Frequency home blood tests: Once a month. His overall blood glucose range is 70-90 mg/dl.   Hx of carotid stenosis followed by specialist for this.  Reports most of his sugars in good control in a relative 100. No low sugar spells. Trying to watch diet.  Ongoing back pain fairly severe times. Radiates into the right leg. Had back surgery but unfortunately it did not help at all according to the patient.  Claims compliance with blood pressure and lipid meds. No obvious side effects. Trying to watch his diet.  Urologist to started patient on a new medicine which includes Flomax plus testosterone blocker. Patient to finish Flomax portion before starting other   Review of Systems No current chest pain no abdominal pain no change in bowel habits no blood in stool ROS otherwise negative    Objective:   Physical Exam  Alert no apparent distress vitals reviewed. H&T normal. Lungs clear. Heart regular in rhythm. Ankles without edema.  See diabetic foot exam     Assessment & Plan:  Impression 1 type 2 diabetes good control. #2 hypertension good control. #3 chronic pain ongoing and significant discussed plan hydrocodone refilled. Maintain other medications. Diet exercise discussed. No blood work this time blood work on next visit. WSL

## 2014-01-26 ENCOUNTER — Other Ambulatory Visit: Payer: Self-pay | Admitting: Rheumatology

## 2014-01-26 ENCOUNTER — Ambulatory Visit (HOSPITAL_COMMUNITY)
Admission: RE | Admit: 2014-01-26 | Discharge: 2014-01-26 | Disposition: A | Discharge status: Home / Self Care | Payer: Medicare Other | Source: Ambulatory Visit | Attending: Rheumatology | Admitting: Rheumatology

## 2014-01-26 ENCOUNTER — Other Ambulatory Visit (HOSPITAL_COMMUNITY): Payer: Self-pay | Admitting: Rheumatology

## 2014-01-26 ENCOUNTER — Other Ambulatory Visit: Payer: Self-pay | Admitting: Family Medicine

## 2014-01-26 DIAGNOSIS — M7989 Other specified soft tissue disorders: Secondary | ICD-10-CM

## 2014-01-26 DIAGNOSIS — M79609 Pain in unspecified limb: Secondary | ICD-10-CM | POA: Insufficient documentation

## 2014-01-26 DIAGNOSIS — R609 Edema, unspecified: Secondary | ICD-10-CM

## 2014-02-04 ENCOUNTER — Emergency Department (HOSPITAL_COMMUNITY): Payer: Medicare Other

## 2014-02-04 ENCOUNTER — Encounter (HOSPITAL_COMMUNITY): Payer: Self-pay | Admitting: Emergency Medicine

## 2014-02-04 ENCOUNTER — Inpatient Hospital Stay (HOSPITAL_COMMUNITY)
Admission: EM | Admit: 2014-02-04 | Discharge: 2014-02-08 | DRG: 682 | Disposition: A | Discharge status: Home / Self Care | Payer: Medicare Other | Attending: Internal Medicine | Admitting: Internal Medicine

## 2014-02-04 DIAGNOSIS — E875 Hyperkalemia: Secondary | ICD-10-CM

## 2014-02-04 DIAGNOSIS — Z96659 Presence of unspecified artificial knee joint: Secondary | ICD-10-CM

## 2014-02-04 DIAGNOSIS — Z800 Family history of malignant neoplasm of digestive organs: Secondary | ICD-10-CM

## 2014-02-04 DIAGNOSIS — E119 Type 2 diabetes mellitus without complications: Secondary | ICD-10-CM | POA: Diagnosis present

## 2014-02-04 DIAGNOSIS — M51379 Other intervertebral disc degeneration, lumbosacral region without mention of lumbar back pain or lower extremity pain: Secondary | ICD-10-CM | POA: Diagnosis present

## 2014-02-04 DIAGNOSIS — M069 Rheumatoid arthritis, unspecified: Secondary | ICD-10-CM | POA: Diagnosis present

## 2014-02-04 DIAGNOSIS — D649 Anemia, unspecified: Secondary | ICD-10-CM | POA: Diagnosis present

## 2014-02-04 DIAGNOSIS — I739 Peripheral vascular disease, unspecified: Secondary | ICD-10-CM | POA: Diagnosis present

## 2014-02-04 DIAGNOSIS — E872 Acidosis, unspecified: Secondary | ICD-10-CM | POA: Diagnosis present

## 2014-02-04 DIAGNOSIS — R945 Abnormal results of liver function studies: Secondary | ICD-10-CM

## 2014-02-04 DIAGNOSIS — Z79899 Other long term (current) drug therapy: Secondary | ICD-10-CM

## 2014-02-04 DIAGNOSIS — Z87442 Personal history of urinary calculi: Secondary | ICD-10-CM

## 2014-02-04 DIAGNOSIS — N400 Enlarged prostate without lower urinary tract symptoms: Secondary | ICD-10-CM | POA: Diagnosis present

## 2014-02-04 DIAGNOSIS — Z87891 Personal history of nicotine dependence: Secondary | ICD-10-CM

## 2014-02-04 DIAGNOSIS — A0472 Enterocolitis due to Clostridium difficile, not specified as recurrent: Secondary | ICD-10-CM | POA: Diagnosis present

## 2014-02-04 DIAGNOSIS — N179 Acute kidney failure, unspecified: Principal | ICD-10-CM | POA: Diagnosis present

## 2014-02-04 DIAGNOSIS — R7401 Elevation of levels of liver transaminase levels: Secondary | ICD-10-CM | POA: Diagnosis present

## 2014-02-04 DIAGNOSIS — I4891 Unspecified atrial fibrillation: Secondary | ICD-10-CM | POA: Diagnosis present

## 2014-02-04 DIAGNOSIS — M5137 Other intervertebral disc degeneration, lumbosacral region: Secondary | ICD-10-CM | POA: Diagnosis present

## 2014-02-04 DIAGNOSIS — G8929 Other chronic pain: Secondary | ICD-10-CM | POA: Diagnosis present

## 2014-02-04 DIAGNOSIS — I10 Essential (primary) hypertension: Secondary | ICD-10-CM | POA: Diagnosis present

## 2014-02-04 DIAGNOSIS — R7989 Other specified abnormal findings of blood chemistry: Secondary | ICD-10-CM

## 2014-02-04 DIAGNOSIS — I2510 Atherosclerotic heart disease of native coronary artery without angina pectoris: Secondary | ICD-10-CM | POA: Diagnosis present

## 2014-02-04 DIAGNOSIS — Z7982 Long term (current) use of aspirin: Secondary | ICD-10-CM

## 2014-02-04 DIAGNOSIS — K219 Gastro-esophageal reflux disease without esophagitis: Secondary | ICD-10-CM | POA: Diagnosis present

## 2014-02-04 DIAGNOSIS — Z8249 Family history of ischemic heart disease and other diseases of the circulatory system: Secondary | ICD-10-CM

## 2014-02-04 DIAGNOSIS — R7402 Elevation of levels of lactic acid dehydrogenase (LDH): Secondary | ICD-10-CM | POA: Diagnosis present

## 2014-02-04 DIAGNOSIS — E039 Hypothyroidism, unspecified: Secondary | ICD-10-CM | POA: Diagnosis present

## 2014-02-04 DIAGNOSIS — J984 Other disorders of lung: Secondary | ICD-10-CM | POA: Diagnosis present

## 2014-02-04 DIAGNOSIS — J189 Pneumonia, unspecified organism: Secondary | ICD-10-CM | POA: Diagnosis present

## 2014-02-04 DIAGNOSIS — E2749 Other adrenocortical insufficiency: Secondary | ICD-10-CM | POA: Diagnosis present

## 2014-02-04 DIAGNOSIS — E785 Hyperlipidemia, unspecified: Secondary | ICD-10-CM | POA: Diagnosis present

## 2014-02-04 DIAGNOSIS — E860 Dehydration: Secondary | ICD-10-CM | POA: Diagnosis present

## 2014-02-04 DIAGNOSIS — IMO0002 Reserved for concepts with insufficient information to code with codable children: Secondary | ICD-10-CM

## 2014-02-04 DIAGNOSIS — R740 Nonspecific elevation of levels of transaminase and lactic acid dehydrogenase [LDH]: Secondary | ICD-10-CM

## 2014-02-04 DIAGNOSIS — Z951 Presence of aortocoronary bypass graft: Secondary | ICD-10-CM

## 2014-02-04 DIAGNOSIS — Z806 Family history of leukemia: Secondary | ICD-10-CM

## 2014-02-04 LAB — CBC WITH DIFFERENTIAL/PLATELET
BASOS ABS: 0 10*3/uL (ref 0.0–0.1)
Basophils Relative: 0 % (ref 0–1)
Eosinophils Absolute: 0.1 10*3/uL (ref 0.0–0.7)
Eosinophils Relative: 1 % (ref 0–5)
HCT: 35.2 % — ABNORMAL LOW (ref 39.0–52.0)
Hemoglobin: 11.4 g/dL — ABNORMAL LOW (ref 13.0–17.0)
LYMPHS ABS: 0.7 10*3/uL (ref 0.7–4.0)
LYMPHS PCT: 6 % — AB (ref 12–46)
MCH: 28.6 pg (ref 26.0–34.0)
MCHC: 32.4 g/dL (ref 30.0–36.0)
MCV: 88.4 fL (ref 78.0–100.0)
Monocytes Absolute: 0.9 10*3/uL (ref 0.1–1.0)
Monocytes Relative: 8 % (ref 3–12)
NEUTROS ABS: 9.5 10*3/uL — AB (ref 1.7–7.7)
NEUTROS PCT: 85 % — AB (ref 43–77)
PLATELETS: 184 10*3/uL (ref 150–400)
RBC: 3.98 MIL/uL — AB (ref 4.22–5.81)
RDW: 17.6 % — ABNORMAL HIGH (ref 11.5–15.5)
WBC: 11.1 10*3/uL — ABNORMAL HIGH (ref 4.0–10.5)

## 2014-02-04 LAB — COMPREHENSIVE METABOLIC PANEL
ALK PHOS: 424 U/L — AB (ref 39–117)
ALT: 54 U/L — AB (ref 0–53)
AST: 74 U/L — AB (ref 0–37)
Albumin: 2.6 g/dL — ABNORMAL LOW (ref 3.5–5.2)
BUN: 69 mg/dL — ABNORMAL HIGH (ref 6–23)
CO2: 16 meq/L — AB (ref 19–32)
Calcium: 10.8 mg/dL — ABNORMAL HIGH (ref 8.4–10.5)
Chloride: 105 mEq/L (ref 96–112)
Creatinine, Ser: 2.79 mg/dL — ABNORMAL HIGH (ref 0.50–1.35)
GFR, EST AFRICAN AMERICAN: 25 mL/min — AB (ref >90)
GFR, EST NON AFRICAN AMERICAN: 22 mL/min — AB (ref >90)
Glucose, Bld: 147 mg/dL — ABNORMAL HIGH (ref 70–99)
POTASSIUM: 6.1 meq/L — AB (ref 3.7–5.3)
SODIUM: 138 meq/L (ref 137–147)
Total Bilirubin: 2.8 mg/dL — ABNORMAL HIGH (ref 0.3–1.2)
Total Protein: 7 g/dL (ref 6.0–8.3)

## 2014-02-04 LAB — RAPID URINE DRUG SCREEN, HOSP PERFORMED
Amphetamines: NOT DETECTED
Barbiturates: NOT DETECTED
Benzodiazepines: POSITIVE — AB
Cocaine: NOT DETECTED
OPIATES: POSITIVE — AB
Tetrahydrocannabinol: NOT DETECTED

## 2014-02-04 LAB — URINALYSIS, ROUTINE W REFLEX MICROSCOPIC
Glucose, UA: NEGATIVE mg/dL
HGB URINE DIPSTICK: NEGATIVE
Ketones, ur: NEGATIVE mg/dL
LEUKOCYTES UA: NEGATIVE
NITRITE: NEGATIVE
Protein, ur: 30 mg/dL — AB
UROBILINOGEN UA: 1 mg/dL (ref 0.0–1.0)
pH: 6 (ref 5.0–8.0)

## 2014-02-04 LAB — URINE MICROSCOPIC-ADD ON

## 2014-02-04 LAB — GLUCOSE, CAPILLARY: Glucose-Capillary: 273 mg/dL — ABNORMAL HIGH (ref 70–99)

## 2014-02-04 LAB — ETHANOL: Alcohol, Ethyl (B): <11 mg/dL (ref 0–11)

## 2014-02-04 LAB — LACTIC ACID, PLASMA: Lactic Acid, Venous: 0.9 mmol/L (ref 0.5–2.2)

## 2014-02-04 LAB — TROPONIN I: Troponin I: <0.3 ng/mL (ref <0.30)

## 2014-02-04 LAB — LIPASE, BLOOD: LIPASE: 173 U/L — AB (ref 11–59)

## 2014-02-04 LAB — AMMONIA: Ammonia: 17 umol/L (ref 11–60)

## 2014-02-04 MED ORDER — ZOLPIDEM TARTRATE 5 MG PO TABS
5.0000 mg | ORAL_TABLET | Freq: Every evening | ORAL | Status: DC | PRN
  Administered 2014-02-05: 5 mg via ORAL
  Filled 2014-02-04: qty 1

## 2014-02-04 MED ORDER — INSULIN ASPART 100 UNIT/ML ~~LOC~~ SOLN
10.0000 [IU] | Freq: Once | SUBCUTANEOUS | Status: AC
  Administered 2014-02-04: 10 [IU] via SUBCUTANEOUS
  Filled 2014-02-04: qty 1

## 2014-02-04 MED ORDER — SODIUM CHLORIDE 0.9 % IJ SOLN
3.0000 mL | Freq: Two times a day (BID) | INTRAMUSCULAR | Status: DC
  Administered 2014-02-05 – 2014-02-08 (×5): 3 mL via INTRAVENOUS

## 2014-02-04 MED ORDER — ASPIRIN EC 81 MG PO TBEC
81.0000 mg | DELAYED_RELEASE_TABLET | Freq: Every day | ORAL | Status: DC
  Administered 2014-02-05 – 2014-02-08 (×4): 81 mg via ORAL
  Filled 2014-02-04 (×4): qty 1

## 2014-02-04 MED ORDER — DEXTROSE 5 % IV SOLN
500.0000 mg | Freq: Once | INTRAVENOUS | Status: AC
  Administered 2014-02-04: 500 mg via INTRAVENOUS

## 2014-02-04 MED ORDER — SODIUM POLYSTYRENE SULFONATE 15 GM/60ML PO SUSP
15.0000 g | Freq: Once | ORAL | Status: AC
  Administered 2014-02-04: 15 g via ORAL
  Filled 2014-02-04: qty 60

## 2014-02-04 MED ORDER — DEXTROSE 5 % IV SOLN
1.0000 g | INTRAVENOUS | Status: DC
  Administered 2014-02-05 – 2014-02-06 (×2): 1 g via INTRAVENOUS
  Filled 2014-02-04 (×3): qty 10

## 2014-02-04 MED ORDER — LEVOTHYROXINE SODIUM 100 MCG PO TABS
100.0000 ug | ORAL_TABLET | Freq: Every day | ORAL | Status: DC
  Administered 2014-02-05 – 2014-02-08 (×4): 100 ug via ORAL
  Filled 2014-02-04 (×4): qty 1

## 2014-02-04 MED ORDER — RISAQUAD PO CAPS
1.0000 | ORAL_CAPSULE | Freq: Every day | ORAL | Status: DC
  Administered 2014-02-05 – 2014-02-08 (×4): 1 via ORAL
  Filled 2014-02-04 (×5): qty 1

## 2014-02-04 MED ORDER — ONDANSETRON HCL 4 MG PO TABS: 4.0000 mg | ORAL_TABLET | Freq: Four times a day (QID) | ORAL | Status: DC | PRN

## 2014-02-04 MED ORDER — PREDNISONE 20 MG PO TABS
20.0000 mg | ORAL_TABLET | Freq: Two times a day (BID) | ORAL | Status: DC
  Administered 2014-02-05: 20 mg via ORAL
  Filled 2014-02-04 (×2): qty 1

## 2014-02-04 MED ORDER — DEXTROSE 5 % IV SOLN
1.0000 g | Freq: Once | INTRAVENOUS | Status: AC
  Administered 2014-02-04: 1 g via INTRAVENOUS
  Filled 2014-02-04: qty 10

## 2014-02-04 MED ORDER — HYDROCORTISONE NA SUCCINATE PF 100 MG IJ SOLR
50.0000 mg | Freq: Three times a day (TID) | INTRAMUSCULAR | Status: DC
  Administered 2014-02-04 – 2014-02-05 (×2): 50 mg via INTRAVENOUS
  Filled 2014-02-04 (×2): qty 2

## 2014-02-04 MED ORDER — ONDANSETRON HCL 4 MG/2ML IJ SOLN
4.0000 mg | Freq: Four times a day (QID) | INTRAMUSCULAR | Status: DC | PRN
  Administered 2014-02-04: 4 mg via INTRAVENOUS
  Filled 2014-02-04: qty 2

## 2014-02-04 MED ORDER — SODIUM CHLORIDE 0.9 % IV SOLN
1.0000 g | Freq: Once | INTRAVENOUS | Status: AC
  Administered 2014-02-04: 1 g via INTRAVENOUS
  Filled 2014-02-04: qty 10

## 2014-02-04 MED ORDER — SODIUM CHLORIDE 0.9 % IV BOLUS (SEPSIS)
500.0000 mL | Freq: Once | INTRAVENOUS | Status: AC
  Administered 2014-02-04: 500 mL via INTRAVENOUS

## 2014-02-04 MED ORDER — TAMSULOSIN HCL 0.4 MG PO CAPS
0.8000 mg | ORAL_CAPSULE | Freq: Every day | ORAL | Status: DC
  Administered 2014-02-05 – 2014-02-08 (×4): 0.8 mg via ORAL
  Filled 2014-02-04 (×4): qty 2

## 2014-02-04 MED ORDER — HEPARIN SODIUM (PORCINE) 5000 UNIT/ML IJ SOLN
5000.0000 [IU] | Freq: Three times a day (TID) | INTRAMUSCULAR | Status: DC
  Administered 2014-02-04 – 2014-02-08 (×11): 5000 [IU] via SUBCUTANEOUS
  Filled 2014-02-04 (×10): qty 1

## 2014-02-04 MED ORDER — DEXTROSE 5 % IV SOLN
500.0000 mg | INTRAVENOUS | Status: DC
  Administered 2014-02-05: 500 mg via INTRAVENOUS
  Filled 2014-02-04 (×2): qty 500

## 2014-02-04 MED ORDER — GI COCKTAIL ~~LOC~~
30.0000 mL | Freq: Once | ORAL | Status: AC
  Administered 2014-02-04: 30 mL via ORAL
  Filled 2014-02-04: qty 30

## 2014-02-04 MED ORDER — CEFTRIAXONE SODIUM 1 G IJ SOLR
1.0000 g | Freq: Once | INTRAMUSCULAR | Status: DC
  Filled 2014-02-04: qty 10

## 2014-02-04 MED ORDER — ATORVASTATIN CALCIUM 40 MG PO TABS
80.0000 mg | ORAL_TABLET | Freq: Every day | ORAL | Status: DC
  Administered 2014-02-04 – 2014-02-07 (×4): 80 mg via ORAL
  Filled 2014-02-04 (×4): qty 2

## 2014-02-04 MED ORDER — SODIUM CHLORIDE 0.9 % IV SOLN
INTRAVENOUS | Status: DC
  Administered 2014-02-04: 23:00:00 via INTRAVENOUS

## 2014-02-04 MED ORDER — FOLIC ACID 1 MG PO TABS
1.0000 mg | ORAL_TABLET | Freq: Every day | ORAL | Status: DC
  Administered 2014-02-05 – 2014-02-08 (×4): 1 mg via ORAL
  Filled 2014-02-04 (×4): qty 1

## 2014-02-04 MED ORDER — DEXTROSE 50 % IV SOLN
1.0000 | Freq: Once | INTRAVENOUS | Status: AC
  Administered 2014-02-04: 50 mL via INTRAVENOUS

## 2014-02-04 MED ORDER — INSULIN ASPART 100 UNIT/ML ~~LOC~~ SOLN
0.0000 [IU] | Freq: Three times a day (TID) | SUBCUTANEOUS | Status: DC
  Administered 2014-02-05 – 2014-02-07 (×5): 1 [IU] via SUBCUTANEOUS
  Administered 2014-02-08: 2 [IU] via SUBCUTANEOUS

## 2014-02-04 MED ORDER — ATENOLOL 25 MG PO TABS
25.0000 mg | ORAL_TABLET | Freq: Every day | ORAL | Status: DC
  Administered 2014-02-05 – 2014-02-08 (×4): 25 mg via ORAL
  Filled 2014-02-04 (×4): qty 1

## 2014-02-04 MED ORDER — DEXTROSE 50 % IV SOLN
INTRAVENOUS | Status: AC
Start: 2014-02-04 — End: 2014-02-05
  Filled 2014-02-04: qty 50

## 2014-02-04 MED ORDER — INSULIN ASPART 100 UNIT/ML ~~LOC~~ SOLN
0.0000 [IU] | Freq: Every day | SUBCUTANEOUS | Status: DC
  Administered 2014-02-04: 3 [IU] via SUBCUTANEOUS

## 2014-02-04 MED ORDER — FAMOTIDINE IN NACL 20-0.9 MG/50ML-% IV SOLN
20.0000 mg | Freq: Once | INTRAVENOUS | Status: AC
  Administered 2014-02-04: 20 mg via INTRAVENOUS
  Filled 2014-02-04: qty 50
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
## 2014-02-04 NOTE — Progress Notes (Signed)
ANTIBIOTIC CONSULT NOTE - INITIAL  Pharmacy Consult for Renal Adjustment Antibiotics Indication: pneumonia  Allergies  Allergen Reactions  . Amoxicillin Hives and Other (See Comments)    Hallucinations   . Ciprofloxacin Other (See Comments)    C-diff  . Doxycycline Nausea And Vomiting  . Levofloxacin Nausea And Vomiting    Patient Measurements: Height: 5\' 8"  (172.7 cm) Weight: 145 lb (65.772 kg) IBW/kg (Calculated) : 68.4 Adjusted Body Weight:   Vital Signs: Temp: 97.7 F (36.5 C) (05/15 1940) Temp src: Oral (05/15 1804) BP: 149/54 mmHg (05/15 2032) Pulse Rate: 62 (05/15 2032) Intake/Output from previous day:   Intake/Output from this shift:    Labs:  Recent Labs  02/04/14 1835  WBC 11.1*  HGB 11.4*  PLT 184  CREATININE 2.79*   Estimated Creatinine Clearance: 23.6 ml/min (by C-G formula based on Cr of 2.79). No results found for this basename: VANCOTROUGH, VANCOPEAK, VANCORANDOM, GENTTROUGH, GENTPEAK, GENTRANDOM, TOBRATROUGH, TOBRAPEAK, TOBRARND, AMIKACINPEAK, AMIKACINTROU, AMIKACIN,  in the last 72 hours   Microbiology: No results found for this or any previous visit (from the past 720 hour(s)).  Medical History: Past Medical History  Diagnosis Date  . 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 Hyperlipidemia   . Hypertension   . PVD (peripheral vascular disease)     Left iliac PCI/stent  . Cerebrovascular disease   . Tobacco abuse, in remission     Remote  . GERD (gastroesophageal reflux disease)   . Hypothyroid   . Allergic rhinitis   . Chronic lung disease     ?  Rheumatoid lung; ?  Adverse reaction to methotrexate  . Schatzki's ring   . Hx of Clostridium difficile infection     Recurrent; associated 40 pound weight loss  . Achalasia   . Hiatal hernia   . Diabetes mellitus     borderline no meds  . Atrial fibrillation   . Chronic back pain   . 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 Cervical spondylosis    . DDD (degenerative disc disease), lumbar   . Nephrolithiasis 2010    s/p lithotripsy  . Pneumonia     Medications:   Scheduled:    Assessment: Rocephin 1 GM IV given in ED Azithromycin 500 mg IV given in ED Antibiotics continued on admission  Goal of Therapy:  Eradicate infection  Plan:  Rocephin 1 GM IV every 24 hours continued on admission Azithromycin 500 mg IV every 24 hours continued on admission No renal adjustment necessary F/U changes in antibiotics F/U LOT Labs per protocol  Lorynn Moeser 2011 02/04/2014,9:15 PM

## 2014-02-05 ENCOUNTER — Inpatient Hospital Stay (HOSPITAL_COMMUNITY): Payer: Medicare Other

## 2014-02-05 DIAGNOSIS — E875 Hyperkalemia: Secondary | ICD-10-CM

## 2014-02-05 DIAGNOSIS — A0472 Enterocolitis due to Clostridium difficile, not specified as recurrent: Secondary | ICD-10-CM

## 2014-02-05 DIAGNOSIS — R945 Abnormal results of liver function studies: Secondary | ICD-10-CM

## 2014-02-05 DIAGNOSIS — E872 Acidosis, unspecified: Secondary | ICD-10-CM

## 2014-02-05 DIAGNOSIS — R7989 Other specified abnormal findings of blood chemistry: Secondary | ICD-10-CM

## 2014-02-05 LAB — COMPREHENSIVE METABOLIC PANEL
ALT: 60 U/L — ABNORMAL HIGH (ref 0–53)
AST: 82 U/L — AB (ref 0–37)
Albumin: 2.4 g/dL — ABNORMAL LOW (ref 3.5–5.2)
Alkaline Phosphatase: 434 U/L — ABNORMAL HIGH (ref 39–117)
BILIRUBIN TOTAL: 2 mg/dL — AB (ref 0.3–1.2)
BUN: 65 mg/dL — ABNORMAL HIGH (ref 6–23)
CHLORIDE: 108 meq/L (ref 96–112)
CO2: 10 mEq/L — CL (ref 19–32)
CREATININE: 2.54 mg/dL — AB (ref 0.50–1.35)
Calcium: 10.6 mg/dL — ABNORMAL HIGH (ref 8.4–10.5)
GFR calc Af Amer: 28 mL/min — ABNORMAL LOW (ref >90)
GFR calc non Af Amer: 24 mL/min — ABNORMAL LOW (ref >90)
Glucose, Bld: 81 mg/dL (ref 70–99)
Potassium: 6.4 mEq/L — ABNORMAL HIGH (ref 3.7–5.3)
Sodium: 138 mEq/L (ref 137–147)
TOTAL PROTEIN: 7 g/dL (ref 6.0–8.3)

## 2014-02-05 LAB — CBC
HCT: 37.6 % — ABNORMAL LOW (ref 39.0–52.0)
Hemoglobin: 11.8 g/dL — ABNORMAL LOW (ref 13.0–17.0)
MCH: 28.1 pg (ref 26.0–34.0)
MCHC: 31.4 g/dL (ref 30.0–36.0)
MCV: 89.5 fL (ref 78.0–100.0)
Platelets: 212 10*3/uL (ref 150–400)
RBC: 4.2 MIL/uL — AB (ref 4.22–5.81)
RDW: 17.7 % — ABNORMAL HIGH (ref 11.5–15.5)
WBC: 13.6 10*3/uL — ABNORMAL HIGH (ref 4.0–10.5)

## 2014-02-05 LAB — BASIC METABOLIC PANEL
BUN: 62 mg/dL — AB (ref 6–23)
CHLORIDE: 107 meq/L (ref 96–112)
CO2: 15 mEq/L — ABNORMAL LOW (ref 19–32)
Calcium: 9.7 mg/dL (ref 8.4–10.5)
Creatinine, Ser: 2.53 mg/dL — ABNORMAL HIGH (ref 0.50–1.35)
GFR calc Af Amer: 28 mL/min — ABNORMAL LOW (ref >90)
GFR, EST NON AFRICAN AMERICAN: 25 mL/min — AB (ref >90)
GLUCOSE: 145 mg/dL — AB (ref 70–99)
POTASSIUM: 4.9 meq/L (ref 3.7–5.3)
SODIUM: 140 meq/L (ref 137–147)

## 2014-02-05 LAB — CLOSTRIDIUM DIFFICILE BY PCR: Toxigenic C. Difficile by PCR: POSITIVE — AB

## 2014-02-05 LAB — HEPATITIS PANEL, ACUTE
HCV AB: NEGATIVE
HEP A IGM: NONREACTIVE
HEP B S AG: NEGATIVE
Hep B C IgM: NONREACTIVE

## 2014-02-05 LAB — HIV ANTIBODY (ROUTINE TESTING W REFLEX): HIV: NONREACTIVE

## 2014-02-05 LAB — MRSA PCR SCREENING: MRSA BY PCR: NEGATIVE

## 2014-02-05 LAB — GLUCOSE, CAPILLARY
GLUCOSE-CAPILLARY: 121 mg/dL — AB (ref 70–99)
Glucose-Capillary: 89 mg/dL (ref 70–99)
Glucose-Capillary: 131 mg/dL — ABNORMAL HIGH (ref 70–99)
Glucose-Capillary: 130 mg/dL — ABNORMAL HIGH (ref 70–99)

## 2014-02-05 MED ORDER — DEXTROSE 50 % IV SOLN
INTRAVENOUS | Status: AC
  Filled 2014-02-05: qty 50

## 2014-02-05 MED ORDER — DEXTROSE 50 % IV SOLN
1.0000 | Freq: Once | INTRAVENOUS | Status: AC
  Administered 2014-02-05: 50 mL via INTRAVENOUS

## 2014-02-05 MED ORDER — SODIUM BICARBONATE 8.4 % IV SOLN
INTRAVENOUS | Status: AC
  Filled 2014-02-05: qty 100

## 2014-02-05 MED ORDER — SODIUM CHLORIDE 0.45 % IV SOLN
INTRAVENOUS | Status: DC
  Administered 2014-02-05 – 2014-02-06 (×2): via INTRAVENOUS
  Filled 2014-02-05 (×7): qty 1000

## 2014-02-05 MED ORDER — METRONIDAZOLE 500 MG PO TABS
500.0000 mg | ORAL_TABLET | Freq: Three times a day (TID) | ORAL | Status: DC
  Administered 2014-02-05 – 2014-02-08 (×12): 500 mg via ORAL
  Filled 2014-02-05 (×12): qty 1

## 2014-02-05 MED ORDER — STERILE WATER FOR INJECTION IV SOLN
INTRAVENOUS | Status: DC
  Filled 2014-02-05 (×8): qty 850

## 2014-02-05 MED ORDER — STERILE WATER FOR INJECTION IV SOLN
Freq: Once | INTRAVENOUS | Status: AC
  Administered 2014-02-05: 15:00:00 via INTRAVENOUS
  Filled 2014-02-05: qty 850

## 2014-02-05 MED ORDER — SODIUM BICARBONATE 8.4 % IV SOLN
INTRAVENOUS | Status: DC
  Filled 2014-02-05 (×5): qty 100

## 2014-02-05 MED ORDER — INSULIN ASPART 100 UNIT/ML IV SOLN
10.0000 [IU] | Freq: Once | INTRAVENOUS | Status: AC
  Administered 2014-02-05: 10 [IU] via INTRAVENOUS

## 2014-02-05 MED ORDER — SODIUM POLYSTYRENE SULFONATE 15 GM/60ML PO SUSP
15.0000 g | Freq: Once | ORAL | Status: AC
  Administered 2014-02-05: 15 g via ORAL
  Filled 2014-02-05: qty 60

## 2014-02-05 MED ORDER — OXYCODONE HCL 5 MG PO TABS
5.0000 mg | ORAL_TABLET | Freq: Four times a day (QID) | ORAL | Status: DC | PRN
  Administered 2014-02-05 – 2014-02-07 (×5): 5 mg via ORAL
  Filled 2014-02-05 (×5): qty 1

## 2014-02-05 NOTE — Progress Notes (Signed)
Critical CO2 of 10 called by lab. MD notified via text page

## 2014-02-05 NOTE — Progress Notes (Signed)
PROGRESS NOTE  [name] YBO:[id number] DOB: 10/03/1945 DOA: 02/04/2014 PCP: [name], MD  Summary: 68 year old man with complex past medical history including rheumatoid arthritis maintained on prednisone for over 10 years who presented with several day history of fatigue, generalized weakness, nonproductive cough, diarrhea. Admitted for community-acquired pneumonia, acute renal failure, hyperkalemia, possible adrenal insufficiency.  Assessment/Plan: 1. Pneumonia. Recently treated with clindamycin for tooth infection, however, respiratory rate, no hypoxia. Therefore continue current antibiotics ceftriaxone and Zithromax, especially in light of cdiff infection. 2. Acute renal failure likely secondary to GI loss, possibly complicated by ibuprofen, lisinopril. Somewhat better today with fluids. 3. Combined anion gap and non-gap metabolic acidosis. Likely secondary to uremia and bicarbonate loss through diarrhea. IVF, sodium bicarbonate. 4. Transaminitis, elevated alkaline phosphatase and total bilirubin, elevated lipase. Total bilirubin somewhat improved, otherwise no significant change. Etiology and significance unclear. Plan to obtain right upper quadrant ultrasound and hepatitis panel. 5. Hyperkalemia. Secondary to acute renal failure complicated by lisinopril and potassium supplementation. Without improvement despite incremental improvement in renal function and standard treatment on admission. Telemetry unremarkable, sinus rhythm. Has received standard treatment again this morning. 6. Adrenal insufficiency? Patient is hypertensive, do not suspect adrenal insufficiency at this time. 7. Recurrent C. 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 Patient cannot recall when he last had C. difficile, last seen in the system 12/2012 8. Hypercalcemia. Likely secondary to dehydration. 9. RA on prednisone 10mg  for over 10 years. 10. Hypothyroidism. 11. Recent tooth infection treated with  clindamycin as an outpatient. 12. History of chronic lung disease, possible reaction to methotrexate "rheumatoid lung". Appears stable. 13. Chronic back pain. Urine drug screen positive for benzodiazepines and opiates.   Continue empiric Rocephin and Zithromax for pneumonia. Continue Flagyl for cdiff.  Continue IV fluids, serial BMP, monitor potassium closely. Followup renal ultrasound. Place foley.  Check abdominal ultrasound, hepatitis panel, CMP in the morning.  Sputum culture, pneumonia pathway, blood cultures  Appears clinically stable given constellation of signs and symptoms patient's acutely ill.  Code Status: full code DVT prophylaxis: heparin subq  Family Communication: none present Disposition Plan: home when improved  , MD  [facility]  Pager [phone] If 7PM-7AM, please contact night-coverage at [email or web address], password [facility] 02/05/2014, 7:40 AM  LOS: 1 day   Consultants:    Procedures:    Antibiotics:  Azithromycin 5/15 >>   Ceftriaxone 5/15 >>   Flagyl  5/16 >>  HPI/Subjective: No complaints this morning. No n/v/d. No abdominal pain. No SOB.  Objective: Filed Vitals:   02/05/14 0300 02/05/14 0400 02/05/14 0500 02/05/14 0600  BP: 148/67 161/76 157/79 184/83  Pulse: 55 57 66 69  Temp:  97.7 F (36.5 C)    TempSrc:  Oral    Resp: 16 16 12 16   Height:      Weight:   67.6 kg (149 lb 0.5 oz)   SpO2: 100% 100% 99% 98%    Intake/Output Summary (Last 24 hours) at 02/05/14 0740 Last data filed at 02/05/14 0600  Gross per 24 hour  Intake    530 ml  Output    375 ml  Net    155 ml     Filed Weights   02/04/14 1804 02/04/14 2248 02/05/14 0500  Weight: 65.772 kg (145 lb) 66 kg (145 lb 8.1 oz) 67.6 kg (149 lb 0.5 oz)    Exam:   Afebrile, vital signs stable. Hypertensive.  Gen. Appears calm, comfortable, nontoxic. Sitting up in bed, eating breakfast.  Psychiatric. Grossly normal  mood and affect. Speech is fluent and  clear. Alert and oriented to self, month, year, location, president.  Eyes. Pupils equal, round, react to light. Normal lids, irises.  ENT poor dentition.  Cardiovascular. Regular rate and rhythm. No murmur, rub or gallop. No lower extremity edema. Telemetry sinus rhythm.  Respiratory. Clear to auscultation bilaterally. No wheezes or rales or rhonchi. Normal respiratory effort.  Abdomen gross inspection unremarkable. Soft, nondistended. Mild to moderate right upper quadrant pain.  Skin. Grossly unremarkable.  Musculoskeletal. Arthropathy noted consistent with history of rheumatoid arthritis. Left fifth toe surgically absent.  Data Reviewed:  Urine output incompletely recorded  Capillary blood sugars stable.  Potassium 6.4, CO2 10, BUN somewhat improved 65, creatinine somewhat improved, 2.54. Calcium 10.6. Alkaline phosphatase 434, total bilirubin improving, 2.0. AST and ALT without significant change  CBC stable.  C. difficile positive.  Chest x-ray suggested left base pneumonia.  Scheduled Meds: . acidophilus  1 capsule Oral Daily  . aspirin EC  81 mg Oral Daily  . atenolol  25 mg Oral Daily  . atorvastatin  80 mg Oral q1800  . azithromycin  500 mg Intravenous Q24H  . cefTRIAXone (ROCEPHIN)  IV  1 g Intravenous Q24H  . dextrose  1 ampule Intravenous Once  . dextrose      . folic acid  1 mg Oral Daily  . heparin  5,000 Units Subcutaneous 3 times per day  . hydrocortisone sod succinate (SOLU-CORTEF) inj  50 mg Intravenous Q8H  . insulin aspart  0-5 Units Subcutaneous QHS  . insulin aspart  0-9 Units Subcutaneous TID WC  . insulin aspart  10 Units Intravenous Once  . levothyroxine  100 mcg Oral QAC breakfast  . metroNIDAZOLE  500 mg Oral 3 times per day  . [START ON 02/06/2014] predniSONE  20 mg Oral BID WC  . sodium chloride  3 mL Intravenous Q12H  . sodium polystyrene  15 g Oral Once  . tamsulosin  0.8 mg Oral Daily   Continuous Infusions: . sodium chloride 75  mL/hr at 02/05/14 0600  .  sodium bicarbonate  infusion 1000 mL      Principal Problem:   AKI (acute kidney injury) Active Problems:   HYPOTHYROIDISM   Hyperlipidemia   Peripheral vascular disease   Rheumatoid arthritis(714.0)   Diabetes mellitus   Chronic lung disease   Prostate hypertrophy   CAP (community acquired pneumonia)   Secondary adrenal insufficiency   Metabolic acidosis   Elevated LFTs   Hyperkalemia   C. difficile colitis   Time spent 40 minutes

## 2014-02-05 NOTE — Progress Notes (Signed)
Pt is cdiff positive. MD notified via page

## 2014-02-06 DIAGNOSIS — N179 Acute kidney failure, unspecified: Secondary | ICD-10-CM

## 2014-02-06 DIAGNOSIS — E875 Hyperkalemia: Secondary | ICD-10-CM

## 2014-02-06 DIAGNOSIS — J189 Pneumonia, unspecified organism: Secondary | ICD-10-CM

## 2014-02-06 DIAGNOSIS — A0472 Enterocolitis due to Clostridium difficile, not specified as recurrent: Secondary | ICD-10-CM

## 2014-02-06 LAB — GLUCOSE, CAPILLARY
GLUCOSE-CAPILLARY: 126 mg/dL — AB (ref 70–99)
GLUCOSE-CAPILLARY: 85 mg/dL (ref 70–99)
GLUCOSE-CAPILLARY: 101 mg/dL — AB (ref 70–99)
Glucose-Capillary: 98 mg/dL (ref 70–99)

## 2014-02-06 LAB — COMPREHENSIVE METABOLIC PANEL
ALBUMIN: 2.1 g/dL — AB (ref 3.5–5.2)
ALK PHOS: 335 U/L — AB (ref 39–117)
ALT: 43 U/L (ref 0–53)
AST: 56 U/L — ABNORMAL HIGH (ref 0–37)
BUN: 50 mg/dL — AB (ref 6–23)
CALCIUM: 8.2 mg/dL — AB (ref 8.4–10.5)
CO2: 23 mEq/L (ref 19–32)
Chloride: 104 mEq/L (ref 96–112)
Creatinine, Ser: 1.93 mg/dL — ABNORMAL HIGH (ref 0.50–1.35)
GFR calc non Af Amer: 34 mL/min — ABNORMAL LOW (ref >90)
GFR, EST AFRICAN AMERICAN: 39 mL/min — AB (ref >90)
GLUCOSE: 123 mg/dL — AB (ref 70–99)
Potassium: 3.2 mEq/L — ABNORMAL LOW (ref 3.7–5.3)
SODIUM: 142 meq/L (ref 137–147)
Total Bilirubin: 0.9 mg/dL (ref 0.3–1.2)
Total Protein: 5.5 g/dL — ABNORMAL LOW (ref 6.0–8.3)

## 2014-02-06 LAB — CBC
HEMATOCRIT: 28.6 % — AB (ref 39.0–52.0)
HEMOGLOBIN: 9.7 g/dL — AB (ref 13.0–17.0)
MCH: 28.4 pg (ref 26.0–34.0)
MCHC: 33.9 g/dL (ref 30.0–36.0)
MCV: 83.6 fL (ref 78.0–100.0)
Platelets: 168 10*3/uL (ref 150–400)
RBC: 3.42 MIL/uL — AB (ref 4.22–5.81)
RDW: 16.8 % — ABNORMAL HIGH (ref 11.5–15.5)
WBC: 11.3 10*3/uL — ABNORMAL HIGH (ref 4.0–10.5)

## 2014-02-06 LAB — LIPASE, BLOOD: LIPASE: 215 U/L — AB (ref 11–59)

## 2014-02-06 MED ORDER — SODIUM CHLORIDE 0.9 % IV SOLN
INTRAVENOUS | Status: DC
  Administered 2014-02-06 – 2014-02-07 (×2): via INTRAVENOUS

## 2014-02-06 MED ORDER — ALPRAZOLAM 0.5 MG PO TABS
0.5000 mg | ORAL_TABLET | Freq: Once | ORAL | Status: AC
  Administered 2014-02-06: 0.5 mg via ORAL
  Filled 2014-02-06: qty 1

## 2014-02-06 MED ORDER — POTASSIUM CHLORIDE CRYS ER 20 MEQ PO TBCR: 40.0000 meq | EXTENDED_RELEASE_TABLET | Freq: Once | ORAL | Status: DC

## 2014-02-06 MED ORDER — PREDNISONE 20 MG PO TABS
20.0000 mg | ORAL_TABLET | Freq: Every day | ORAL | Status: DC
  Administered 2014-02-07 – 2014-02-08 (×2): 20 mg via ORAL
  Filled 2014-02-06 (×2): qty 1

## 2014-02-06 MED ORDER — AZITHROMYCIN 250 MG PO TABS
500.0000 mg | ORAL_TABLET | Freq: Every day | ORAL | Status: DC
  Administered 2014-02-06 – 2014-02-07 (×2): 500 mg via ORAL
  Filled 2014-02-06 (×2): qty 2

## 2014-02-06 MED ORDER — AMLODIPINE BESYLATE 5 MG PO TABS
5.0000 mg | ORAL_TABLET | Freq: Every day | ORAL | Status: DC
  Administered 2014-02-06 – 2014-02-08 (×3): 5 mg via ORAL
  Filled 2014-02-06 (×3): qty 1

## 2014-02-06 NOTE — Progress Notes (Signed)
PROGRESS NOTE  [name] WCH:[id number] DOB: 1946-01-26 DOA: 02/04/2014 PCP: [name], MD  Summary: 68 year old man with complex past medical history including rheumatoid arthritis maintained on prednisone for over 10 years who presented with several day history of fatigue, generalized weakness, nonproductive cough, diarrhea. Admitted for community-acquired pneumonia, acute renal failure, hyperkalemia, possible adrenal insufficiency. Subsequently diagnosed as C. difficile colitis.  Assessment/Plan: 1. Recurrent C. 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 Clinically improving. Patient cannot recall when he last had C. difficile, last seen in the system 12/2012. 2. Pneumonia. Recently treated with clindamycin for tooth infection, however, respiratory rate normal, no hypoxia. Continue antibiotics. 3. Acute renal failure likely secondary to GI loss, possibly complicated by ibuprofen, lisinopril. Slowly improving. 4. Combined anion gap and non-gap metabolic acidosis. Resolved. Likely secondary to uremia and bicarbonate loss through diarrhea.  5. Transaminitis, elevated alkaline phosphatase and total bilirubin, elevated lipase. No abdominal pain, right upper quadrant ultrasound unremarkable. Elevated LFTs trending downwards. Elevated lipase nonspecific. No abdominal pain or symptoms to suggest pancreatitis. Likely elevated secondary to acute illness. 6. Hypertension. Remains elevated. Add Norvasc. Hold ACE-I, no diuretic for now. 7. Hyperkalemia. Resolved. Secondary to acute renal failure complicated by lisinopril and potassium supplementation.  8. Normocytic anemia without bleeding. Monitor clinically. Suspect anemia of chronic disease. 9. RA on prednisone 10mg  for over 10 years. 10. Hypothyroidism. 11. Recent tooth infection treated with clindamycin as an outpatient. 12. History of chronic lung disease, possible reaction to methotrexate "rheumatoid lung". Stable. 13. Chronic back pain.  Stable. Urine drug screen positive for benzodiazepines and opiates.   Change to oral antibiotics for pneumonia. Favor a short course given C. Difficile. Continue Flagyl for cdiff.  CBC, BMP in the morning.  Continue IV fluids.  Transfer to medical floor today.  Discontinue Foley 5/18.  Code Status: full code DVT prophylaxis: heparin subq  Family Communication: none present Disposition Plan: home when improved  6/18, MD  [facility]  Pager [phone] If 7PM-7AM, please contact night-coverage at [email or web address], password [facility] 02/06/2014, 9:09 AM  LOS: 2 days   Consultants:    Procedures:    Antibiotics:  Azithromycin 5/15 >>   Ceftriaxone 5/15 >>   Flagyl  5/16 >>  HPI/Subjective: No issues overnight. He reports diarrhea improved. Good appetite. No nausea, vomiting or abdominal pain. No cough or shortness of breath. No complaints except chronic rheumatoid joint pain.  Objective: Filed Vitals:   02/06/14 0200 02/06/14 0300 02/06/14 0400 02/06/14 0500  BP: 184/73 180/80 173/72   Pulse: 45  74   Temp:   97.9 F (36.6 C)   TempSrc:   Axillary   Resp: 20 19 21    Height:      Weight:    67.9 kg (149 lb 11.1 oz)  SpO2: 96%  95%     Intake/Output Summary (Last 24 hours) at 02/06/14 0909 Last data filed at 02/06/14 0653  Gross per 24 hour  Intake      0 ml  Output    756 ml  Net   -756 ml     Filed Weights   02/04/14 2248 02/05/14 0500 02/06/14 0500  Weight: 66 kg (145 lb 8.1 oz) 67.6 kg (149 lb 0.5 oz) 67.9 kg (149 lb 11.1 oz)    Exam:   Afebrile, vital signs stable. Remains hypertensive.  Gen. Appears calm, comfortable. Sitting up. Eating breakfast. Well-appearing.  Cardiovascular. Regular rate and rhythm. No murmur, rub gallop. Telemetry sinus bradycardia, sinus rhythm. No lower extremity edema.  Respiratory clear to auscultation bilaterally. No wheezes, rales or rhonchi. Normal respiratory effort.  Abdomen soft nontender and  nondistended.  Psychiatric. Grossly normal mood and affect. Speech fluent and appropriate.  Data Reviewed:  Adequate urine output.  Capillary blood sugars stable.  Potassium 3.2. BUN, creatinine continued to trend downwards. CO2 was normalized. Alkaline phosphatase, AST, ALT 20 downwards. Total bilirubin normal. Lipase 215.  Leukocytosis trending downward. Hemoglobin trending downwards, 9.7  Hepatitis panel negative. HIV nonreactive. Blood cultures no growth thus far.  Renal ultrasound without hydronephrosis.  Right upper quadrant ultrasound cholelithiasis. No evidence of acute cholecystitis.  Scheduled Meds: . acidophilus  1 capsule Oral Daily  . aspirin EC  81 mg Oral Daily  . atenolol  25 mg Oral Daily  . atorvastatin  80 mg Oral q1800  . azithromycin  500 mg Intravenous Q24H  . cefTRIAXone (ROCEPHIN)  IV  1 g Intravenous Q24H  . folic acid  1 mg Oral Daily  . heparin  5,000 Units Subcutaneous 3 times per day  . insulin aspart  0-5 Units Subcutaneous QHS  . insulin aspart  0-9 Units Subcutaneous TID WC  . levothyroxine  100 mcg Oral QAC breakfast  . metroNIDAZOLE  500 mg Oral 3 times per day  . predniSONE  20 mg Oral BID WC  . sodium chloride  3 mL Intravenous Q12H  . tamsulosin  0.8 mg Oral Daily   Continuous Infusions: . sodium chloride 0.45 % 1,000 mL with sodium bicarbonate 150 mEq infusion 150 mL/hr at 02/06/14 3354    Principal Problem:   AKI (acute kidney injury) Active Problems:   HYPOTHYROIDISM   Hyperlipidemia   Peripheral vascular disease   Rheumatoid arthritis(714.0)   Diabetes mellitus   Chronic lung disease   Prostate hypertrophy   CAP (community acquired pneumonia)   Secondary adrenal insufficiency   Metabolic acidosis   Elevated LFTs   Hyperkalemia   C. difficile colitis   Time spent 25 minutes

## 2014-02-07 LAB — CBC
HCT: 32.5 % — ABNORMAL LOW (ref 39.0–52.0)
Hemoglobin: 10.9 g/dL — ABNORMAL LOW (ref 13.0–17.0)
MCH: 28.5 pg (ref 26.0–34.0)
MCHC: 33.5 g/dL (ref 30.0–36.0)
MCV: 84.9 fL (ref 78.0–100.0)
PLATELETS: 155 10*3/uL (ref 150–400)
RBC: 3.83 MIL/uL — ABNORMAL LOW (ref 4.22–5.81)
RDW: 16.9 % — ABNORMAL HIGH (ref 11.5–15.5)
WBC: 7.7 10*3/uL (ref 4.0–10.5)

## 2014-02-07 LAB — GLUCOSE, CAPILLARY
GLUCOSE-CAPILLARY: 66 mg/dL — AB (ref 70–99)
GLUCOSE-CAPILLARY: 125 mg/dL — AB (ref 70–99)
GLUCOSE-CAPILLARY: 143 mg/dL — AB (ref 70–99)
Glucose-Capillary: 110 mg/dL — ABNORMAL HIGH (ref 70–99)
Glucose-Capillary: 126 mg/dL — ABNORMAL HIGH (ref 70–99)

## 2014-02-07 LAB — BASIC METABOLIC PANEL
BUN: 31 mg/dL — ABNORMAL HIGH (ref 6–23)
CO2: 25 mEq/L (ref 19–32)
Calcium: 8.4 mg/dL (ref 8.4–10.5)
Chloride: 104 mEq/L (ref 96–112)
Creatinine, Ser: 1.34 mg/dL (ref 0.50–1.35)
GFR calc Af Amer: 61 mL/min — ABNORMAL LOW (ref >90)
GFR, EST NON AFRICAN AMERICAN: 53 mL/min — AB (ref >90)
Glucose, Bld: 81 mg/dL (ref 70–99)
POTASSIUM: 2.8 meq/L — AB (ref 3.7–5.3)
SODIUM: 143 meq/L (ref 137–147)

## 2014-02-07 LAB — URINE CULTURE
Colony Count: NO GROWTH
Culture: NO GROWTH

## 2014-02-07 MED ORDER — POTASSIUM CHLORIDE CRYS ER 20 MEQ PO TBCR
40.0000 meq | EXTENDED_RELEASE_TABLET | Freq: Four times a day (QID) | ORAL | Status: AC
  Administered 2014-02-07 (×4): 40 meq via ORAL
  Filled 2014-02-07 (×4): qty 2

## 2014-02-07 MED ORDER — HYDROCODONE-ACETAMINOPHEN 10-325 MG PO TABS
1.0000 | ORAL_TABLET | Freq: Three times a day (TID) | ORAL | Status: DC
  Administered 2014-02-07 – 2014-02-08 (×3): 1 via ORAL
  Filled 2014-02-07 (×4): qty 1

## 2014-02-07 MED ORDER — CEFUROXIME AXETIL 250 MG PO TABS
500.0000 mg | ORAL_TABLET | Freq: Two times a day (BID) | ORAL | Status: DC
  Administered 2014-02-07 – 2014-02-08 (×2): 500 mg via ORAL
  Filled 2014-02-07 (×2): qty 2

## 2014-02-07 MED ORDER — ALPRAZOLAM 0.5 MG PO TABS: 0.5000 mg | ORAL_TABLET | Freq: Every day | ORAL | Status: DC | PRN

## 2014-02-07 MED ORDER — LEFLUNOMIDE 20 MG PO TABS
20.0000 mg | ORAL_TABLET | Freq: Every day | ORAL | Status: DC
  Administered 2014-02-07 – 2014-02-08 (×2): 20 mg via ORAL
  Filled 2014-02-07 (×3): qty 1

## 2014-02-07 MED ORDER — LISINOPRIL 10 MG PO TABS
20.0000 mg | ORAL_TABLET | Freq: Every day | ORAL | Status: DC
  Administered 2014-02-07 – 2014-02-08 (×2): 20 mg via ORAL
  Filled 2014-02-07 (×2): qty 2

## 2014-02-07 NOTE — Progress Notes (Signed)
PROGRESS NOTE  [name] YTK:[id number] DOB: [birth year] DOA: 02/04/2014 PCP: [name], MD  Summary: 68 year old man with complex past medical history including rheumatoid arthritis maintained on prednisone for over 10 years who presented with several day history of fatigue, generalized weakness, nonproductive cough, diarrhea. Admitted for community-acquired pneumonia, acute renal failure, hyperkalemia, possible adrenal insufficiency. Subsequently diagnosed as C. difficile colitis.  Assessment/Plan: 1. Recurrent C. 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 Clinically improving. Last episode appears to have been in 2014. 2. Pneumonia. Recently treated with clindamycin for tooth infection, however, respiratory rate normal, no hypoxia. Continue antibiotics. 3. Acute renal failure likely secondary to GI loss, possibly complicated by ibuprofen, lisinopril. Appears resolved at this point. 4. Combined anion gap and non-gap metabolic acidosis. Resolved. Likely secondary to uremia and bicarbonate loss through diarrhea.  5. Transaminitis, elevated alkaline phosphatase and total bilirubin, elevated lipase. No abdominal pain, right upper quadrant ultrasound unremarkable. Elevated LFTs trending downwards. Elevated lipase nonspecific. No abdominal pain or symptoms to suggest pancreatitis. Likely elevated secondary to acute illness. 6. Hypertension. Remains elevated. Add Norvasc. Resume ACE inhibitor. Hold diuretics for now. 7. Hyperkalemia. Resolved. Secondary to acute renal failure complicated by lisinopril and potassium supplementation.  8. Normocytic anemia without bleeding. Monitor clinically. Suspect anemia of chronic disease. 9. RA on prednisone 10mg  for over 10 years. 10. Recent tooth infection treated with clindamycin as an outpatient. 11. History of chronic lung disease, possible reaction to methotrexate "rheumatoid lung". Stable. 12. Chronic back pain. Stable.    Change to oral  antibiotics for pneumonia. Favor a short course given C. Difficile. Continue Flagyl for cdiff.  Saline lock IV. Resume lisinopril.  Discontinue Foley  Likely old next one to 2 days.  Code Status: full code DVT prophylaxis: heparin subq  Family Communication: none present Disposition Plan: home when improved  , MD  [facility]  Pager [phone] If 7PM-7AM, please contact night-coverage at [email or web address], password [facility] 02/07/2014, 3:41 PM  LOS: 3 days   Consultants:    Procedures:    Antibiotics:  Azithromycin 5/15 >> 5/19  Ceftriaxone 5/15 >> 5/17  Ceftin 5/18 >>  Flagyl  5/16 >>  HPI/Subjective: Continues to feel better. No further diarrhea since this morning. Tolerating diet. No pain except shoulder which is chronic.  Objective: Filed Vitals:   02/06/14 2035 02/07/14 0353 02/07/14 0356 02/07/14 1335  BP: 187/86 215/94 209/99 162/75  Pulse: 60 82  79  Temp: 98.2 F (36.8 C) 98.3 F (36.8 C)  98.6 F (37 C)  TempSrc: Oral Oral  Oral  Resp: 20 20  20   Height:      Weight:      SpO2: 96% 96%  93%    Intake/Output Summary (Last 24 hours) at 02/07/14 1541 Last data filed at 02/07/14 1300  Gross per 24 hour  Intake 2675.41 ml  Output    651 ml  Net 2024.41 ml     Filed Weights   02/04/14 2248 02/05/14 0500 02/06/14 0500  Weight: 66 kg (145 lb 8.1 oz) 67.6 kg (149 lb 0.5 oz) 67.9 kg (149 lb 11.1 oz)    Exam:   Afebrile, vital signs stable.   Gen. Appears calm and comfortable. Sitting on the side of the bed.  Cardiovascular regular rate and rhythm. No murmur, rub or gallop. No lower extremity edema.  Respiratory clear to auscultation bilaterally. No wheezes, rales or rhonchi. Normal respiratory effort.  Abdomen soft and nontender.  Data Reviewed:  Hypoglycemia this morning.  Potassium 2.8.  Creatinine has normalized. BUN near normal.  Hemoglobin stable 10.9  Scheduled Meds: . acidophilus  1 capsule Oral Daily  .  amLODipine  5 mg Oral Daily  . aspirin EC  81 mg Oral Daily  . atenolol  25 mg Oral Daily  . atorvastatin  80 mg Oral q1800  . azithromycin  500 mg Oral QHS  . cefTRIAXone (ROCEPHIN)  IV  1 g Intravenous Q24H  . folic acid  1 mg Oral Daily  . heparin  5,000 Units Subcutaneous 3 times per day  . HYDROcodone-acetaminophen  1 tablet Oral TID  . insulin aspart  0-5 Units Subcutaneous QHS  . insulin aspart  0-9 Units Subcutaneous TID WC  . leflunomide  20 mg Oral Daily  . levothyroxine  100 mcg Oral QAC breakfast  . lisinopril  20 mg Oral Daily  . metroNIDAZOLE  500 mg Oral 3 times per day  . potassium chloride  40 mEq Oral QID  . predniSONE  20 mg Oral Q breakfast  . sodium chloride  3 mL Intravenous Q12H  . tamsulosin  0.8 mg Oral Daily   Continuous Infusions:    Principal Problem:   AKI (acute kidney injury) Active Problems:   HYPOTHYROIDISM   Hyperlipidemia   Peripheral vascular disease   Rheumatoid arthritis(714.0)   Diabetes mellitus   Chronic lung disease   Prostate hypertrophy   CAP (community acquired pneumonia)   Secondary adrenal insufficiency   Metabolic acidosis   Elevated LFTs   Hyperkalemia   C. difficile colitis   Time spent 20 minutes

## 2014-02-08 ENCOUNTER — Other Ambulatory Visit (HOSPITAL_COMMUNITY): Payer: Self-pay | Admitting: Internal Medicine

## 2014-02-08 LAB — BASIC METABOLIC PANEL
BUN: 20 mg/dL (ref 6–23)
CALCIUM: 8.6 mg/dL (ref 8.4–10.5)
CO2: 24 meq/L (ref 19–32)
Chloride: 104 mEq/L (ref 96–112)
Creatinine, Ser: 1.06 mg/dL (ref 0.50–1.35)
GFR calc Af Amer: 81 mL/min — ABNORMAL LOW (ref >90)
GFR calc non Af Amer: 70 mL/min — ABNORMAL LOW (ref >90)
Glucose, Bld: 104 mg/dL — ABNORMAL HIGH (ref 70–99)
Potassium: 3.6 mEq/L — ABNORMAL LOW (ref 3.7–5.3)
SODIUM: 140 meq/L (ref 137–147)

## 2014-02-08 LAB — GLUCOSE, CAPILLARY
GLUCOSE-CAPILLARY: 183 mg/dL — AB (ref 70–99)
Glucose-Capillary: 79 mg/dL (ref 70–99)

## 2014-02-08 LAB — MAGNESIUM: MAGNESIUM: 1.7 mg/dL (ref 1.5–2.5)

## 2014-02-08 LAB — STREP PNEUMONIAE URINARY ANTIGEN: Strep Pneumo Urinary Antigen: NEGATIVE

## 2014-02-08 MED ORDER — PREDNISONE 10 MG PO TABS: 10.0000 mg | ORAL_TABLET | Freq: Every day | ORAL | Status: DC

## 2014-02-08 MED ORDER — CEFUROXIME AXETIL 500 MG PO TABS: 500.0000 mg | ORAL_TABLET | Freq: Two times a day (BID) | ORAL | Status: AC

## 2014-02-08 MED ORDER — RISAQUAD PO CAPS: 1.0000 | ORAL_CAPSULE | Freq: Every day | ORAL | Status: DC

## 2014-02-08 MED ORDER — AMLODIPINE BESYLATE 5 MG PO TABS: 5.0000 mg | ORAL_TABLET | Freq: Every day | ORAL | Status: DC

## 2014-02-08 MED ORDER — METRONIDAZOLE 500 MG PO TABS: 500.0000 mg | ORAL_TABLET | Freq: Three times a day (TID) | ORAL | Status: AC

## 2014-02-08 NOTE — Discharge Instructions (Signed)
Clostridium Difficile Infection Clostridium difficile (C. difficile) is a bacteria found in the intestinal tract or colon. Under certain conditions, it causes diarrhea and sometimes severe disease. The severe form of the disease is known as pseudomembranous colitis (often called C. difficile colitis). This disease can damage the lining of the colon or cause the colon to become enlarged (toxic megacolon).  CAUSES  Your colon normally contains many different bacteria, including C. difficile. The balance of bacteria in your colon can change during illness. This is especially true when you take antibiotic medicine. Taking antibiotics may allow the C. difficile to grow, multiply excessively, and make a toxin that then causes illness. The elderly and people with certain medical conditions have a greater risk of getting C. difficile infections. SYMPTOMS   Watery diarrhea.  Fever.  Fatigue.  Loss of appetite.  Nausea.  Abdominal swelling, pain, or tenderness.  Dehydration. DIAGNOSIS  Your symptoms may make your caregiver suspicious of a C. difficile infection, especially if you have used antibiotics in the preceding weeks. However, there are only 2 ways to know for certain whether you have a C. difficile infection:  A lab test that finds the toxin in your stool.  The specific appearance of an abnormality (pseudomembrane) in your colon. This can only be seen by doing a sigmoidoscopy or colonoscopy. These procedures involve passing an instrument through your rectum to look at the inside of your colon. Your caregiver will help determine if these tests are necessary. TREATMENT   Most people are successfully treated with one of two specific antibiotics, usually given by mouth. Other antibiotics you are receiving are stopped if possible.  Intravenous (IV) fluids and correction of electrolyte imbalance may be necessary.  Rarely, surgery may be needed to remove the infected part of the  intestines.  Careful hand washing by you and your caregivers is important to prevent the spread of infection. In the hospital, your caregivers may also put on gowns and gloves to prevent the spread of the C. difficile bacteria. Your room is also cleaned regularly with a solution containing bleach or a product that is known to kill C. difficile. HOME CARE INSTRUCTIONS  Drink enough fluids to keep your urine clear or pale yellow. Avoid milk, caffeine, and alcohol.  Ask your caregiver for specific rehydration instructions.  Try eating small, frequent meals rather than large meals.  Take your antibiotics as directed. Finish them even if you start to feel better.  Do not use medicines to slow diarrhea. This could delay healing or cause complications.  Wash your hands thoroughly after using the bathroom and before preparing food.  Make sure people who live with you wash their hands often, too.  Carefully disinfect all surfaces with a product that contains chlorine bleach. SEEK MEDICAL CARE IF:  Diarrhea persists longer than expected or recurs after completing your course of antibiotic treatment for the C. difficile infection.  You have trouble staying hydrated. SEEK IMMEDIATE MEDICAL CARE IF:  You develop a new fever.  You have increasing abdominal pain or tenderness.  There is blood in your stools, or your stools are dark black and tarry.  You cannot hold down food or liquids. MAKE SURE YOU:   Understand these instructions.  Will watch your condition.  Will get help right away if you are not doing well or get worse. Document Released: 06/19/2005 Document Revised: 01/04/2013 Document Reviewed: 02/15/2011 [employer] Patient Information 2014 [employer].  

## 2014-02-08 NOTE — Care Management Note (Signed)
    Page 1 of 1   02/08/2014     2:21:40 PM CARE MANAGEMENT NOTE 02/08/2014  Patient:  [name], [name]   Account Number:  [id number]  Date Initiated:  02/08/2014  Documentation initiated by:  Rosemary Holms  Subjective/Objective Assessment:   Pt admitted from home where he lives with his wife. Plan to DC back home     Action/Plan:   Anticipated DC Date:  02/08/2014   Anticipated DC Plan:  HOME/SELF CARE      DC Planning Services  CM consult      Choice offered to / List presented to:             Status of service:  Completed, signed off Medicare Important Message given?  YES (If response is "NO", the following Medicare IM given date fields will be blank) Date Medicare IM given:  02/08/2014 Date Additional Medicare IM given:    Discharge Disposition:  HOME/SELF CARE  Per UR Regulation:    If discussed at Long Length of Stay Meetings, dates discussed:    Comments:  02/08/14 Rosemary Holms RN BSN CM

## 2014-02-08 NOTE — Progress Notes (Signed)
Pt discharged home today per Dr. Sharon Seller. Pt's IV site D/C'd and WNL. Pt's VSS. Pt provided with home medication list and discharge instructions. Verbalized understanding. Pt left floor via WC in stable condition accompanied by NT.

## 2014-02-08 NOTE — Care Management Utilization Note (Signed)
UR completed 

## 2014-02-08 NOTE — Discharge Summary (Signed)
DISCHARGE SUMMARY  [name]  MR#: [id number]  DOB:Sep 28, 1945  Date of Admission: 02/04/2014 Date of Discharge: 02/08/2014  Attending Physician:Jeffrey T McClung  Patient's EXH:BZJIRC,V S, MD  Consults: None  Disposition: d/c home w/ wife w/ HHPT/OT  Follow-up Appts:     Follow-up Information   Schedule an appointment as soon as possible for a visit with [name], MD. (make an appointment to be seen in 3-5 days)    Specialty:  Family Medicine   Contact information:   [address] [place] [address] [phone]       Tests Needing Follow-up: - check of renal function, LFTs, Hgb, and electrolytes is suggested  - check to assure diarrhea has resolved is suggested  - check BP to determine if medication titration indicated   Discharge Diagnoses: Recurrent C. 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 Pneumonia Acute renal failure likely secondary to GI loss, possibly complicated by ibuprofen lisinopril Combined anion gap and non-gap metabolic acidosis Transaminitis Hypertension Hyperkalemia Normocytic anemia without bleeding RA on prednisone 10mg  for over 10 years Recent tooth infection treated with clindamycin as an outpatient History of chronic lung disease, possible reaction to methotrexate "rheumatoid lung" Chronic back pain  Initial presentation: 68 year old man with complex past medical history including rheumatoid arthritis maintained on prednisone for over 10 years who presented with several day history of fatigue, generalized weakness, nonproductive cough, diarrhea. Admitted for community-acquired pneumonia, acute renal failure, hyperkalemia, possible adrenal insufficiency. Subsequently diagnosed as C. difficile colitis.   Hospital Course:  1. Recurrent C. 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 Clinically improving. Last episode appears to have been in 2014. To complete 14 day flagly course AFTER abx for pna  completed.   2. Pneumonia. Recently treated with clindamycin for tooth infection, however, respiratory rate normal, no hypoxia. Continue antibiotics for 7 day course.  Clinically stable.  3. Acute renal failure likely secondary to GI loss, possibly complicated by ibuprofen, lisinopril. Appears resolved at this point. 4. Combined anion gap and non-gap metabolic acidosis. Resolved. Likely secondary to uremia and bicarbonate loss through diarrhea.  5. Transaminitis, elevated alkaline phosphatase and total bilirubin, elevated lipase. No abdominal pain, right upper quadrant ultrasound unremarkable. Elevated LFTs trending downwards. Elevated lipase nonspecific. No abdominal pain or symptoms to suggest pancreatitis. Likely elevated secondary to acute illness. Recheck in outpt setting suggested. 6. Hypertension. BP poorly controlled/remains elevated. Added Norvasc. Resumed ACE inhibitor. Hold diuretics for until intake improved (perhaps resume as outpt) 7. Hyperkalemia. Resolved. Secondary to acute renal failure complicated by lisinopril and potassium supplementation.  8. Normocytic anemia without bleeding. Monitor clinically. Suspect anemia of chronic disease. 9. RA on prednisone 10mg  for over 10 years. No change in dosing. 10. Recent tooth infection treated with clindamycin as an outpatient. 11. History of chronic lung disease, possible reaction to methotrexate "rheumatoid lung". Stable. 12. Chronic back pain. Stable.     Medication List    STOP taking these medications       clindamycin 300 MG capsule  Commonly known as:  CLEOCIN     omeprazole 20 MG capsule  Commonly known as:  PRILOSEC      TAKE these medications       acidophilus Caps capsule  Take 1 capsule by mouth daily.     ALPRAZolam 0.5 MG tablet  Commonly known as:  XANAX  Take 0.5 mg by mouth daily.     amLODipine 5 MG tablet  Commonly known as:  NORVASC  Take 1 tablet (5 mg  total) by mouth daily.     aspirin EC 81 MG  tablet  Take 81 mg by mouth daily.     atenolol 25 MG tablet  Commonly known as:  TENORMIN  Take 1 tablet (25 mg total) by mouth daily.     atorvastatin 80 MG tablet  Commonly known as:  LIPITOR  Take 1 tablet (80 mg total) by mouth daily.     cefUROXime 500 MG tablet  Commonly known as:  CEFTIN  Take 1 tablet (500 mg total) by mouth 2 (two) times daily with a meal.     HYDROcodone-acetaminophen 10-325 MG per tablet  Commonly known as:  NORCO  Take 1 tablet by mouth 3 (three) times daily.     ibuprofen 800 MG tablet  Commonly known as:  ADVIL,MOTRIN  Take 800 mg by mouth 3 (three) times daily.     JALYN 0.5-0.4 MG Caps  Generic drug:  Dutasteride-Tamsulosin HCl  Take 1 capsule by mouth daily.     leflunomide 20 MG tablet  Commonly known as:  ARAVA  Take 20 mg by mouth daily.     levothyroxine 100 MCG tablet  Commonly known as:  SYNTHROID, LEVOTHROID  Take 100 mcg by mouth daily before breakfast.     lisinopril 20 MG tablet  Commonly known as:  PRINIVIL,ZESTRIL  Take 20 mg by mouth daily.     Melatonin 3 MG Tabs  Take 2 tablets by mouth at bedtime.     metroNIDAZOLE 500 MG tablet  Commonly known as:  FLAGYL  Take 1 tablet (500 mg total) by mouth 3 (three) times daily.     multivitamin capsule  Take 1 capsule by mouth daily.     Potassium Gluconate 595 MG Caps  Take 1 capsule by mouth daily.     predniSONE 10 MG tablet  Commonly known as:  DELTASONE  Take 10 mg by mouth daily with breakfast.     tamsulosin 0.4 MG Caps capsule  Commonly known as:  FLOMAX  Take 0.8 mg by mouth daily.     VITAMIN B COMPLEX PO  Take 1 tablet by mouth daily.     vitamin C 500 MG tablet  Commonly known as:  ASCORBIC ACID  Take 500 mg by mouth daily.       Day of Discharge BP 170/82  Pulse 66  Temp(Src) 97.8 F (36.6 C) (Oral)  Resp 20  Ht 5\' 9"  (1.753 m)  Wt 65.953 kg (145 lb 6.4 oz)  BMI 21.46 kg/m2  SpO2 97%  Physical Exam: General: No acute respiratory  distress at reset  Lungs: Clear to auscultation bilaterally without wheezes or crackles Cardiovascular: Regular rate and rhythm without murmur gallop or rub normal S1 and S2 Abdomen: Nontender, nondistended, soft, bowel sounds positive, no rebound, no ascites, no appreciable mass Extremities: No significant cyanosis, clubbing, or edema bilateral lower extremities  BASIC METABOLIC PANEL     Status: Abnormal   Collection Time    02/08/14  6:18 AM      Result Value Ref Range   Sodium 140  137 - 147 mEq/L   Potassium 3.6 (*) 3.7 - 5.3 mEq/L   Chloride 104  96 - 112 mEq/L   CO2 24  19 - 32 mEq/L   Glucose, Bld 104 (*) 70 - 99 mg/dL   BUN 20  6 - 23 mg/dL   Creatinine, Ser 6.38  0.50 - 1.35 mg/dL   Calcium 8.6  8.4 - 93.7 mg/dL  GFR calc non Af Amer 70 (*) >90 mL/min   GFR calc Af Amer 81 (*) >90 mL/min  MAGNESIUM     Status: None   Collection Time    02/08/14  6:18 AM      Result Value Ref Range   Magnesium 1.7  1.5 - 2.5 mg/dL   Time spent in discharge (includes decision making & examination of pt): >30 minutes  02/08/2014, 2:14 PM   Lonia Blood, MD [facility] Office  [phone] Pager [phone]  On-Call/Text Page:      [email or web address]      password [facility]

## 2014-02-09 LAB — LEGIONELLA ANTIGEN, URINE: LEGIONELLA ANTIGEN, URINE: NEGATIVE

## 2014-02-10 ENCOUNTER — Telehealth: Payer: Self-pay | Admitting: Family Medicine

## 2014-02-10 LAB — CULTURE, BLOOD (ROUTINE X 2)
Culture: NO GROWTH
Culture: NO GROWTH

## 2014-02-10 NOTE — Telephone Encounter (Signed)
Medication list states for him to take amlodipine 5 mg daily and atenolol 25 mg daily. Is it ok for him to take both at the same time or at separate times?

## 2014-02-10 NOTE — Telephone Encounter (Signed)
Discussed with patient

## 2014-02-10 NOTE — Telephone Encounter (Signed)
calandra look in disch summary or nosp disch notes and see if you can answer this

## 2014-02-10 NOTE — Telephone Encounter (Signed)
Patient was recently discharged from the hospital and he says they changed his blood pressure medication. He said they didn't give him any directions on how to take his meds, so he would like to know if he can take the atenolol with the amlodipine together? Or how he should take them. He is hoping we can call him back ASAP, because he hasn't eaten anything yet.

## 2014-02-10 NOTE — Telephone Encounter (Signed)
Both at same time today, starting tom one in the morn and one in the eve

## 2014-02-15 ENCOUNTER — Encounter: Payer: Self-pay | Admitting: Family Medicine

## 2014-02-15 ENCOUNTER — Ambulatory Visit (INDEPENDENT_AMBULATORY_CARE_PROVIDER_SITE_OTHER): Payer: Medicare Other | Admitting: Family Medicine

## 2014-02-15 VITALS — BP 110/72 | Ht 68.0 in | Wt 140.4 lb

## 2014-02-15 DIAGNOSIS — I679 Cerebrovascular disease, unspecified: Secondary | ICD-10-CM

## 2014-02-15 DIAGNOSIS — A0472 Enterocolitis due to Clostridium difficile, not specified as recurrent: Secondary | ICD-10-CM

## 2014-02-15 DIAGNOSIS — G894 Chronic pain syndrome: Secondary | ICD-10-CM

## 2014-02-15 DIAGNOSIS — I10 Essential (primary) hypertension: Secondary | ICD-10-CM

## 2014-02-15 DIAGNOSIS — I2510 Atherosclerotic heart disease of native coronary artery without angina pectoris: Secondary | ICD-10-CM

## 2014-02-15 MED ORDER — HYDROCODONE-ACETAMINOPHEN 10-325 MG PO TABS: ORAL_TABLET | ORAL | Status: DC

## 2014-02-15 MED ORDER — ALPRAZOLAM 0.5 MG PO TABS: 0.5000 mg | ORAL_TABLET | Freq: Every day | ORAL | Status: DC | PRN

## 2014-02-15 NOTE — Progress Notes (Signed)
   Subjective:    Patient ID: [name], male    DOB: 09-08-1946, 68 y.o.   MRN: [id number]  HPI Patient is here today for a hospital follow up visit. Patient was recently discharged from [facility] for C-Diff, acute renal failure and pneumonia. Patient is doing better. Patient states that he needs something prescribed to help with his insomnia.   Entire hospital records reviewed in patient's presence.  Reports stools have improved considerably. Still somewhat loose. But not longer runny. No significant abdominal pain  Claims compliance with new blood pressure medication dosage in. No obvious side effects in this regard.  Ongoing challenges with chronic pain. Patient deals with severe rheumatoid arthritis and secondary pain. Followed by rheumatologist and orthopedist. We have tried initially given the patient has hydrocodone. He has only been on 3 tablets daily since discharge from the hospital and states he needs more. Next  Ongoing challenges with insomnia. Xanax 0.5 is not ideal. Wakes up frequently.  Claims no significant depression. Patient has no other concerns at this time.     Review of Systems No chest pain no significant cough no abdominal pain no change in bowel habits other than note of present illness no rash ROS otherwise negative    Objective:   Physical Exam  Alert no acute distress blood pressure 120/70 on repeat HEENT normal. Lungs clear. Heart regular in rhythm. Positive back pain. Significant deformity secondary to rheumatoid arthritis. Trace edema.      Assessment & Plan:  Impression #1 recent admission for C. difficile gastroenteritis and acute renal failure. Last night 7 . GI symptoms improving. #2 hypertension control discussed. Maintain same meds for now. #3 chronic pain discuss suboptimum will increase to 5 hydrocodone as per day. #4 insomnia ongoing do not want to increase that medicine but the other on board. Discussed. Plan followup as  scheduled. Increase to 150 hydrocodone tablets. Followup with specialist as recommended 45 minutes spent most in discussion. WSL

## 2014-02-16 ENCOUNTER — Telehealth: Payer: Self-pay | Admitting: Family Medicine

## 2014-02-16 NOTE — Telephone Encounter (Signed)
Spoke with patient. I explained to him that his BP was normal when he came in yesterday. I told him s/s to watch out for when it comes to high BPs and when he needs to go to ER. He verbalized understanding.

## 2014-02-16 NOTE — Telephone Encounter (Signed)
Pt yesterday was asking if the LOW number originally obtained by nurse was normal, I re cked and it was perfect. rec cst and f u as sched

## 2014-02-16 NOTE — Telephone Encounter (Signed)
Patients blood pressure is still running a little high, in the 160-170 area. He wants to know if this is normal?

## 2014-02-17 ENCOUNTER — Telehealth: Payer: Self-pay

## 2014-02-17 NOTE — Telephone Encounter (Signed)
Phone call from pt.  Reported redness on the top of the right great toe. Stated there is no pain. Denies any open sore.  Stated it improves at night, and is noticeable later in day.  Questions if he may have some irritation from a shoe.  Stated his shoe cannot be interchanged because he wears a special pair, that the left foot shoe is "built-up" to make-up for his shorter left leg.  Voices concern of the redness of the toe, and understood he should report any changes in his lower extremities.  Reported his next appt. is in July.   Advised will schedule appt. to evaluate the blood flow in the lower extremities, and to see a provider.

## 2014-02-18 ENCOUNTER — Other Ambulatory Visit: Payer: Self-pay | Admitting: *Deleted

## 2014-02-18 DIAGNOSIS — I70219 Atherosclerosis of native arteries of extremities with intermittent claudication, unspecified extremity: Secondary | ICD-10-CM

## 2014-02-18 DIAGNOSIS — M79609 Pain in unspecified limb: Secondary | ICD-10-CM

## 2014-02-18 DIAGNOSIS — Z48812 Encounter for surgical aftercare following surgery on the circulatory system: Secondary | ICD-10-CM

## 2014-02-18 NOTE — Telephone Encounter (Signed)
Spoke with patient to schedule an appointment for 6/3 @ 1130/1220. dpm

## 2014-02-21 NOTE — ED Provider Notes (Signed)
Pt called [facility] ED and call forwarded to me during ED shift; Pt accidentally took extra 5mg  dose amlodipine this evening ~2230 forgeting he took a dose of same ~1830; Pt asymptomatic; record reviewed as Pt explained recent ED visit and admit; feel reasonable for Pt to remain at home and observe at home for symptoms of light-headedness. Pt understands and agrees with assessment and plan.  [id number], MD 02/24/14 [phone]

## 2014-02-22 ENCOUNTER — Telehealth: Payer: Self-pay | Admitting: Family Medicine

## 2014-02-22 ENCOUNTER — Encounter: Payer: Self-pay | Admitting: Family

## 2014-02-22 NOTE — Telephone Encounter (Signed)
Dr Neldon Labella, Mr [facility]ies[facility]'s Dentist would like for you to call him when you get the chance.   He may be reached at [phone]

## 2014-02-23 ENCOUNTER — Encounter: Payer: Self-pay | Admitting: Family

## 2014-02-23 ENCOUNTER — Other Ambulatory Visit: Payer: Self-pay | Admitting: Family

## 2014-02-23 ENCOUNTER — Ambulatory Visit (INDEPENDENT_AMBULATORY_CARE_PROVIDER_SITE_OTHER): Payer: Medicare Other | Admitting: Family

## 2014-02-23 ENCOUNTER — Ambulatory Visit (HOSPITAL_COMMUNITY)
Admission: RE | Admit: 2014-02-23 | Discharge: 2014-02-23 | Disposition: A | Discharge status: Home / Self Care | Payer: Medicare Other | Source: Ambulatory Visit | Attending: Family | Admitting: Family

## 2014-02-23 VITALS — BP 126/73 | HR 62 | Resp 16 | Ht 68.0 in | Wt 144.0 lb

## 2014-02-23 DIAGNOSIS — M79609 Pain in unspecified limb: Secondary | ICD-10-CM | POA: Insufficient documentation

## 2014-02-23 DIAGNOSIS — Z48812 Encounter for surgical aftercare following surgery on the circulatory system: Secondary | ICD-10-CM

## 2014-02-23 DIAGNOSIS — L819 Disorder of pigmentation, unspecified: Secondary | ICD-10-CM | POA: Insufficient documentation

## 2014-02-23 DIAGNOSIS — I70219 Atherosclerosis of native arteries of extremities with intermittent claudication, unspecified extremity: Secondary | ICD-10-CM

## 2014-02-23 NOTE — Patient Instructions (Signed)
Peripheral Vascular Disease Peripheral Vascular Disease (PVD), also called Peripheral Arterial Disease (PAD), is a circulation problem caused by cholesterol (atherosclerotic plaque) deposits in the arteries. PVD commonly occurs in the lower extremities (legs) but it can occur in other areas of the body, such as your arms. The cholesterol buildup in the arteries reduces blood flow which can cause pain and other serious problems. The presence of PVD can place a person at risk for Coronary Artery Disease (CAD).  CAUSES  Causes of PVD can be many. It is usually associated with more than one risk factor such as:   High Cholesterol.  Smoking.  Diabetes.  Lack of exercise or inactivity.  High blood pressure (hypertension).  Obesity.  Family history. SYMPTOMS   When the lower extremities are affected, patients with PVD may experience:  Leg pain with exertion or physical activity. This is called INTERMITTENT CLAUDICATION. This may present as cramping or numbness with physical activity. The location of the pain is associated with the level of blockage. For example, blockage at the abdominal level (distal abdominal aorta) may result in buttock or hip pain. Lower leg arterial blockage may result in calf pain.  As PVD becomes more severe, pain can develop with less physical activity.  In people with severe PVD, leg pain may occur at rest.  Other PVD signs and symptoms:  Leg numbness or weakness.  Coldness in the affected leg or foot, especially when compared to the other leg.  A change in leg color.  Patients with significant PVD are more prone to ulcers or sores on toes, feet or legs. These may take longer to heal or may reoccur. The ulcers or sores can become infected.  If signs and symptoms of PVD are ignored, gangrene may occur. This can result in the loss of toes or loss of an entire limb.  Not all leg pain is related to PVD. Other medical conditions can cause leg pain such  as:  Blood clots (embolism) or Deep Vein Thrombosis.  Inflammation of the blood vessels (vasculitis).  Spinal stenosis. DIAGNOSIS  Diagnosis of PVD can involve several different types of tests. These can include:  Pulse Volume Recording Method (PVR). This test is simple, painless and does not involve the use of X-rays. PVR involves measuring and comparing the blood pressure in the arms and legs. An ABI (Ankle-Brachial Index) is calculated. The normal ratio of blood pressures is 1. As this number becomes smaller, it indicates more severe disease.  < 0.95  indicates significant narrowing in one or more leg vessels.  <0.8 there will usually be pain in the foot, leg or buttock with exercise.  <0.4 will usually have pain in the legs at rest.  <0.25  usually indicates limb threatening PVD.  Doppler detection of pulses in the legs. This test is painless and checks to see if you have a pulses in your legs/feet.  A dye or contrast material (a substance that highlights the blood vessels so they show up on x-ray) may be given to help your caregiver better see the arteries for the following tests. The dye is eliminated from your body by the kidney's. Your caregiver may order blood work to check your kidney function and other laboratory values before the following tests are performed:  Magnetic Resonance Angiography (MRA). An MRA is a picture study of the blood vessels and arteries. The MRA machine uses a large magnet to produce images of the blood vessels.  Computed Tomography Angiography (CTA). A CTA is a   specialized x-ray that looks at how the blood flows in your blood vessels. An IV may be inserted into your arm so contrast dye can be injected.  Angiogram. Is a procedure that uses x-rays to look at your blood vessels. This procedure is minimally invasive, meaning a small incision (cut) is made in your groin. A small tube (catheter) is then inserted into the artery of your groin. The catheter is  guided to the blood vessel or artery your caregiver wants to examine. Contrast dye is injected into the catheter. X-rays are then taken of the blood vessel or artery. After the images are obtained, the catheter is taken out. TREATMENT  Treatment of PVD involves many interventions which may include:  Lifestyle changes:  Quitting smoking.  Exercise.  Following a low fat, low cholesterol diet.  Control of diabetes.  Foot care is very important to the PVD patient. Good foot care can help prevent infection.  Medication:  Cholesterol-lowering medicine.  Blood pressure medicine.  Anti-platelet drugs.  Certain medicines may reduce symptoms of Intermittent Claudication.  Interventional/Surgical options:  Angioplasty. An Angioplasty is a procedure that inflates a balloon in the blocked artery. This opens the blocked artery to improve blood flow.  Stent Implant. A wire mesh tube (stent) is placed in the artery. The stent expands and stays in place, allowing the artery to remain open.  Peripheral Bypass Surgery. This is a surgical procedure that reroutes the blood around a blocked artery to help improve blood flow. This type of procedure may be performed if Angioplasty or stent implants are not an option. SEEK IMMEDIATE MEDICAL CARE IF:   You develop pain or numbness in your arms or legs.  Your arm or leg turns cold, becomes blue in color.  You develop redness, warmth, swelling and pain in your arms or legs. MAKE SURE YOU:   Understand these instructions.  Will watch your condition.  Will get help right away if you are not doing well or get worse. Document Released: 10/17/2004 Document Revised: 12/02/2011 Document Reviewed: 09/13/2008 [employer] Patient Information 2014 [employer].  

## 2014-02-23 NOTE — Progress Notes (Signed)
[facility] [place] HISTORY AND PHYSICAL -PAD  History of Present Illness [name] is a 68 y.o. male patient of Dr. Myra Gianotti who is status post distal left superficial femoral to posterior tibial artery bypass graft with ipsilateral non-reversed, translocated saphenous vein, and left fifth toe amputation with metatarsal head on 01/28/2013. This was done in the setting of an ischemic left fifth toe. He then had left PTA of posterior tibial artery on 10/05/13.  Patient returns today for c/o redness of right great toe that he noticed starting about 2 weeks ago, denies any known injury, denies pain, no ulcerations or wounds in right foot.  Pt states that he continues to perform leg exercises several times/day while sitting. Since the 10/05/2013 angioplasty of his left posterior tibial artery, patient state he has had no further resting pain in his left leg.  He denies claudication symptoms in his legs with walking, denies non healing wounds.  Reports that he has 2 pinched nerves in his lumbar spine that limits his walking due to pain, his worst complaint is right low back pain with radiculopathy and RA pain. He is pending back surgery. He was hospitalized at [facility] in May, 2015 for 4 days days for pneumonia, acute renal failure, dehydration, and c.diff.  Pt Diabetic: "borderline", no DM medications  Pt smoker: former smoker, quit in 1967  Pt meds include:  Statin :Yes  Betablocker: Yes  ASA: Yes  Other anticoagulants/antiplatelets: no   Past Medical History  Diagnosis Date  . 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 Hyperlipidemia   . Hypertension   . PVD (peripheral vascular disease)     Left iliac PCI/stent  . Cerebrovascular disease   . Tobacco abuse, in remission     Remote  . GERD (gastroesophageal reflux disease)   . Hypothyroid   . Allergic rhinitis   . Chronic lung disease     ?  Rheumatoid lung; ?  Adverse  reaction to methotrexate  . Schatzki's ring   . Hx of Clostridium difficile infection     Recurrent; associated 40 pound weight loss  . Achalasia   . Hiatal hernia   . Diabetes mellitus     borderline no meds  . Atrial fibrillation   . Chronic back pain   . 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 Cervical spondylosis   . DDD (degenerative disc disease), lumbar   . Nephrolithiasis 2010    s/p lithotripsy  . Pneumonia     Social History History  Substance Use Topics  . Smoking status: Former Smoker -- 0.50 packs/day for 5 years    Types: Cigarettes    Quit date: 09/23/1965  . Smokeless tobacco: Former Neurosurgeon  . Alcohol Use: No    Family History Family History  Problem Relation Age of Onset  . Stomach cancer Mother 11  . Cancer Mother     Stomach  . Heart disease Mother   . Heart attack Mother   . Heart attack Father   . Heart disease Father   . Heart disease Brother   . Leukemia Brother   . Lung disease Son     infant  . Lung disease Daughter     infant    Past Surgical History  Procedure Laterality Date  . Coronary artery bypass graft      x6 In 1998  . Total knee arthroplasty      Right  . Ankle  fusion      Left  . Wrist fusion      Right  . Shoulder surgery    . Colonoscopy  09/12/2011    Dr. Elly Modena hemorrhoids, normal colon and distal terminal ileum. Random bx negative. Stool for CDiff positive.  . Esophagogastroduodenoscopy  09/13/2011    Dr. Lyndle Herrlich plawues mid-esophagus KOH negative. Distal esophageal ring and ulcer. Mild gastritis. duodenal diverticulum. Savaory dilation 30mm.   Gaspar Bidding dilation  09/13/2011  . Esophageal manometry  09/30/2011    Procedure: ESOPHAGEAL MANOMETRY (EM);  Surgeon: Rob Bunting, MD;  Location: [facility];  Service: Endoscopy;  Laterality: N/A;  . Esophagogastroduodenoscopy  09/12/2011    Dr. Rinaldo Ratel rings s/p dilation  . Cardiac surgery    . Esophagomyotomy  11/12/11    [facility]-  with DOR antireflux surgery  . Esophagogastroduodenoscopy  06/25/2012    Procedure: ESOPHAGOGASTRODUODENOSCOPY (EGD);  Surgeon: Corbin Ade, MD;  Location: [facility];  Service: Endoscopy;  Laterality: N/A;  7:30  . Savory dilation  06/25/2012    Procedure: SAVORY DILATION;  Surgeon: Corbin Ade, MD;  Location: [facility];  Service: Endoscopy;  Laterality: N/A;  Elease Hashimoto dilation  06/25/2012    Procedure: Elease Hashimoto DILATION;  Surgeon: Corbin Ade, MD;  Location: [facility];  Service: Endoscopy;  Laterality: N/A;  . Ankle fusion  09/01/2012    Procedure: ANKLE FUSION;  Surgeon: Valeria Batman, MD;  Location: [facility] OR;  Service: Orthopedics;  Laterality: Left;  Take down of angulated tibial/fibula Fractures  . Femoral-tibial bypass graft Left 01/28/2013    Procedure: BYPASS GRAFT FEMORAL-TIBIAL ARTERY;  Surgeon: Nada Libman, MD;  Location: [facility] OR;  Service: Vascular;  Laterality: Left;  Ultrasound Guided  . Amputation Left 01/28/2013    Procedure: AMPUTATION LEFT 5TH TOE;  Surgeon: Nada Libman, MD;  Location: [facility] OR;  Service: Vascular;  Laterality: Left;  . Joint replacement Right 1999    Knee  . Spine surgery  11-03-13    Allergies  Allergen Reactions  . Clindamycin/Lincomycin Other (See Comments)    Kidney Failure  . Lisinopril Other (See Comments)    Kidney failure   . Amoxicillin Hives and Other (See Comments)    Hallucinations   . Ciprofloxacin Other (See Comments)    C-diff  . Doxycycline Nausea And Vomiting  . Levofloxacin Nausea And Vomiting    Current Outpatient Prescriptions  Medication Sig Dispense Refill  . acidophilus (RISAQUAD) CAPS capsule Take 1 capsule by mouth daily.  30 capsule  0  . ALPRAZolam (XANAX) 0.5 MG tablet Take 1 tablet (0.5 mg total) by mouth daily as needed for anxiety.  30 tablet  5  . amLODipine (NORVASC) 5 MG tablet Take 1 tablet (5 mg total) by mouth daily.  30 tablet  0  . aspirin EC 81 MG tablet Take 81 mg by mouth daily.         Marland Kitchen atenolol (TENORMIN) 25 MG tablet Take 1 tablet (25 mg total) by mouth daily.  30 tablet  4  . atorvastatin (LIPITOR) 80 MG tablet Take 1 tablet (80 mg total) by mouth daily.  30 tablet  3  . B Complex Vitamins (VITAMIN B COMPLEX PO) Take 1 tablet by mouth daily.      . Dutasteride-Tamsulosin HCl (JALYN) 0.5-0.4 MG CAPS Take 1 capsule by mouth daily.       Marland Kitchen HYDROcodone-acetaminophen (NORCO) 10-325 MG per tablet Take 1 tablet up to five times daily  150  tablet  0  . ibuprofen (ADVIL,MOTRIN) 800 MG tablet Take 800 mg by mouth 3 (three) times daily.      Marland Kitchen leflunomide (ARAVA) 20 MG tablet Take 20 mg by mouth daily.      Marland Kitchen levothyroxine (SYNTHROID, LEVOTHROID) 100 MCG tablet Take 100 mcg by mouth daily before breakfast.      . Melatonin 3 MG TABS Take 2 tablets by mouth at bedtime.       . metroNIDAZOLE (FLAGYL) 500 MG tablet Take 1 tablet (500 mg total) by mouth 3 (three) times daily.  50 tablet  0  . Multiple Vitamin (MULTIVITAMIN) capsule Take 1 capsule by mouth daily.      . Potassium Gluconate 595 MG CAPS Take 1 capsule by mouth daily.      . predniSONE (DELTASONE) 10 MG tablet Take 10 mg by mouth daily with breakfast.      . tamsulosin (FLOMAX) 0.4 MG CAPS capsule Take 0.8 mg by mouth daily.      . vitamin C (ASCORBIC ACID) 500 MG tablet Take 500 mg by mouth daily.      Marland Kitchen lisinopril (PRINIVIL,ZESTRIL) 20 MG tablet Take 20 mg by mouth daily.       No current facility-administered medications for this visit.    ROS: See HPI for pertinent positives and negatives.   Physical Examination  Filed Vitals:   02/23/14 1223  BP: 126/73  Pulse: 62  Resp: 16    General: A&O x 3, WDWN,  Gait: slow, deliberate, using rolling walker Eyes: PERRLA,  Pulmonary:  without wheezes , rales in left base, no rhonchi  Cardiac: regular Rythm , without murmur  Carotid Bruits  Left  Right    Negative  Positive   Aorta: is not palpable  Radial pulses: are 2+ and =  VASCULAR EXAM:  Extremities  without ischemic changes  without Gangrene; without open wounds. Right great toe is slightly red, no area that seems rubbed by his shoe.  LE Pulses  LEFT  RIGHT   FEMORAL  palpable  palpable   POPLITEAL  not palpable  not palpable   POSTERIOR TIBIAL  2+ palpable  not palpable   DORSALIS PEDIS  ANTERIOR TIBIAL  Not palpable  not palpable   PERONEAL  not Palpable  not Palpable   Abdomen: soft, NT, no masses.  Skin: no rashes, no ulcers noted.  Musculoskeletal: some generalized muscle, RA deformities noted in hands.  Neurologic: A&O X 3; Appropriate Affect ; SENSATION: normal; MOTOR FUNCTION: moving all extremities equally, motor strength 4/5 throughout. Speech is fluent/normal. CN 2-12 intact.    Non-Invasive Vascular Imaging: DATE: 02/23/2014 ABI: RIGHT [place], TBI: 0.41, Waveforms: mono and biphasic;  LEFT [place], TBI: 0.60, Waveforms: mono and triphasic Previous (01/03/14) ABI/TBI: Right: [place] TBI: 0.51; Left: 1.0, TBI: 0.63   ASSESSMENT: Bailey D Walston is a 68 y.o. male who is status post distal left superficial femoral to posterior tibial artery bypass graft with ipsilateral non-reversed, translocated saphenous vein, and left fifth toe amputation with metatarsal head on 01/28/2013. This was done in the setting of an ischemic left fifth toe.  He then had left PTA of posterior tibial artery on 10/05/13. No problems with left, operative, leg and foot. Right great toe is slightly red, no ulcerations, no apparent rubbed area from his shoe, no known injury. Right great toe TBI decreased to 0.41 from 0.51 in 2 months. No vascular intervention is necessary since he has no ulcerations and no pain in the right  great toe or foot.  Will refer to podiatrist for preventive care with his history of PAD and prediabetes, and the need to take oral corticosteroids for his RA which causes hyperglycemia.  PLAN:  Referral to podiatrist for preventive care, history of PAD and pre-DM. I discussed in depth with the  patient the nature of atherosclerosis, and emphasized the importance of maximal medical management including strict control of blood pressure, blood glucose, and lipid levels, obtaining regular exercise, and continued cessation of smoking.  The patient is aware that without maximal medical management the underlying atherosclerotic disease process will progress, limiting the benefit of any interventions.  Based on the patient's vascular studies and examination, and after discussing with Dr. Edilia Bo, pt will return to clinic as scheduled next month for vascular lab and evaluation by NP.  The patient was given information about PAD including signs, symptoms, treatment, what symptoms should prompt the patient to seek immediate medical care, and risk reduction measures to take.  Charisse March, RN, MSN, FNP-C [facility] [place] Phone: [phone]  Clinic MD: Edilia Bo  02/23/2014 12:41 PM

## 2014-02-25 ENCOUNTER — Other Ambulatory Visit: Payer: Self-pay | Admitting: Family Medicine

## 2014-03-02 ENCOUNTER — Other Ambulatory Visit (HOSPITAL_COMMUNITY): Payer: Self-pay | Admitting: Internal Medicine

## 2014-03-03 ENCOUNTER — Telehealth: Payer: Self-pay | Admitting: Family Medicine

## 2014-03-03 NOTE — Telephone Encounter (Signed)
Patient states that he trusts Dr. [name] and wants him to please switch him to something else. He can not take the way the amlodipine is making him feel and doesn't want to take anymore of it.

## 2014-03-03 NOTE — Telephone Encounter (Signed)
Patient says that the amlodipine is making it to where it is hard for him to wake up and he dreams all kinds of stuff. He said this never happened until he started taking this medication. Please advise.

## 2014-03-03 NOTE — Telephone Encounter (Signed)
Started med 02/08/14

## 2014-03-03 NOTE — Telephone Encounter (Signed)
Very unusual side effect. Dr Brett Canales will be back on Monday he will review M or Tues and possibly try diff med. Please call patient. If he feels this needs to be changed right away I can try but I am not as familiar with his case

## 2014-03-04 MED ORDER — NIFEDIPINE ER OSMOTIC RELEASE 30 MG PO TB24: 30.0000 mg | ORAL_TABLET | Freq: Every day | ORAL | Status: DC

## 2014-03-04 NOTE — Telephone Encounter (Signed)
Discontinue amlodipine do to side effects. Use Procardia XL 30 mg 1 daily. Generic is fine. Thank you #30 with 4 refills. Followup with Dr. Brett Canales to check blood pressure and heart rate somewhere within the next 2-3 weeks. Bring all medications to your next visit.

## 2014-03-04 NOTE — Telephone Encounter (Signed)
Rx sent electronically to pharmacy. Patient notified. 

## 2014-03-07 ENCOUNTER — Other Ambulatory Visit: Payer: Self-pay | Admitting: Cardiology

## 2014-03-07 ENCOUNTER — Telehealth: Payer: Self-pay | Admitting: Family Medicine

## 2014-03-07 ENCOUNTER — Other Ambulatory Visit: Payer: Self-pay | Admitting: Family Medicine

## 2014-03-07 MED ORDER — AMLODIPINE BESYLATE 5 MG PO TABS: 5.0000 mg | ORAL_TABLET | Freq: Every day | ORAL | Status: DC

## 2014-03-07 NOTE — Telephone Encounter (Signed)
cst since pt afraid to take called in med, norvasc definitely does not cause drowsiness

## 2014-03-07 NOTE — Telephone Encounter (Signed)
Notified patient cst since pt afraid to take called in med, norvasc definitely does not cause drowsiness. Patient verbalized understanding. Transferred patient to front desk to schedule appointment.

## 2014-03-07 NOTE — Telephone Encounter (Signed)
Patient says that his blood pressure is currently running high in the morning time and he has a hard time waking up with the norvasc that he is currently taking. He called last week to report this, and Dr. [name] called in nifedical XL 30 mg, but patient decided not to take because he is concerned of the side effects that it has. Patient said he has 3 more norvasc pills for his blood pressure. He would like to know if this medication need to be changed. He currently has an appointment for Friday this week. Does he need to be seen sooner, or can we try him on a different medication?    [facility]

## 2014-03-07 NOTE — Telephone Encounter (Signed)
Medication sent via escribe.  

## 2014-03-08 ENCOUNTER — Encounter: Payer: Self-pay | Admitting: Family Medicine

## 2014-03-08 ENCOUNTER — Ambulatory Visit (INDEPENDENT_AMBULATORY_CARE_PROVIDER_SITE_OTHER): Payer: Medicare Other | Admitting: Family Medicine

## 2014-03-08 VITALS — BP 130/80 | Ht 68.0 in | Wt 138.0 lb

## 2014-03-08 DIAGNOSIS — I2510 Atherosclerotic heart disease of native coronary artery without angina pectoris: Secondary | ICD-10-CM

## 2014-03-08 DIAGNOSIS — R5381 Other malaise: Secondary | ICD-10-CM

## 2014-03-08 DIAGNOSIS — I10 Essential (primary) hypertension: Secondary | ICD-10-CM

## 2014-03-08 DIAGNOSIS — R5383 Other fatigue: Secondary | ICD-10-CM

## 2014-03-08 MED ORDER — LISINOPRIL 20 MG PO TABS: 20.0000 mg | ORAL_TABLET | Freq: Every day | ORAL | Status: DC

## 2014-03-08 NOTE — Progress Notes (Signed)
   Subjective:    Patient ID: [name], male    DOB: 1946-08-08, 68 y.o.   MRN: [id number]  HPI Patient is here today d/t fatigue.  He wakes in the morning, takes his am meds, eats breakfast, and then falls asleep again in his recliner. He cannot wake up no matter how hard he tries. He feels like he is so tired and cannot move.    He started these s/s ever since he got out of the hospital on May 19. The doctor stopped his Lisinpril and started him on Norvasc. That is the only new med change. He wonders if it is related to that.  He also struggles with staying awake when driving.   Very long discussion held with patient and granddaughter.  Patient feels tired and fatigue. Next  He is pretty much convinced that it is due to the Norvasc.  He cannot understand why he cannot take lisinopril. Review once again the hospital records reveals renal insufficiency occurred in the context of acute febrile illness and dehydration. Patient has had blood work since which shows a return to normal creatinine.  Review of Systems No headache no chest pain ongoing chronic back pain ongoing chronic joint pain no abdominal pain no change in bowel habits no blood in stool ROS otherwise negative    Objective:   Physical Exam  Alert no acute distress. Lungs clear heart rare rhythm abdomen benign blood pressure 132/80 on repeat.      Assessment & Plan:  Impression potential side effects and Norvasc and discussed we'll hold this. We'll also resume lisinopril. Creatinine fantastic on last check. Also notable that was able to take lisinopril for many months without difficulty. Recheck in one month. 25 minutes spent most in discussion. WSL

## 2014-03-11 ENCOUNTER — Ambulatory Visit: Payer: Medicare Other | Admitting: Family Medicine

## 2014-03-25 ENCOUNTER — Other Ambulatory Visit: Payer: Self-pay | Admitting: Family Medicine

## 2014-03-28 NOTE — Telephone Encounter (Signed)
Ok plus 5 ref, has tried to get off numerous times but cant

## 2014-03-30 ENCOUNTER — Encounter: Payer: Self-pay | Admitting: Family Medicine

## 2014-03-30 ENCOUNTER — Ambulatory Visit (INDEPENDENT_AMBULATORY_CARE_PROVIDER_SITE_OTHER): Payer: Medicare Other | Admitting: Family Medicine

## 2014-03-30 ENCOUNTER — Other Ambulatory Visit: Payer: Self-pay | Admitting: Family Medicine

## 2014-03-30 VITALS — BP 134/72 | Ht 68.0 in | Wt 137.0 lb

## 2014-03-30 DIAGNOSIS — I10 Essential (primary) hypertension: Secondary | ICD-10-CM

## 2014-03-30 DIAGNOSIS — I2510 Atherosclerotic heart disease of native coronary artery without angina pectoris: Secondary | ICD-10-CM

## 2014-03-30 MED ORDER — ATENOLOL 25 MG PO TABS: 25.0000 mg | ORAL_TABLET | Freq: Two times a day (BID) | ORAL | Status: DC

## 2014-03-30 NOTE — Progress Notes (Signed)
   Subjective:    Patient ID: [name], male    DOB: [birth year], 68 y.o.   MRN: [id number]  Hypertension This is a chronic problem. Compliance problems include exercise.    Blood pressure in the 170's in the evening.  Takes atenolol 25mg  in the am and lisinopril 20mg  at bedtime.  Not sleeping well at night. 3 hours at most.   Bp rises at night and this worries pt    Review of Systems An in chest pain no abdominal pain no change in bowel habits no dizziness ROS otherwise negative    Objective:   Physical Exam  Alert no apparent distress blood pressure 134/82 lungs clear heart rare rhythm      Assessment & Plan:  Impression hypertension good control during the day suboptimum at night. Discussed. Worries patient. Plan add the evening atenolol dose. Rationale discussed. WSL

## 2014-03-31 ENCOUNTER — Ambulatory Visit: Payer: Medicare Other | Admitting: Family Medicine

## 2014-04-19 ENCOUNTER — Encounter: Payer: Self-pay | Admitting: Family

## 2014-04-20 ENCOUNTER — Ambulatory Visit (INDEPENDENT_AMBULATORY_CARE_PROVIDER_SITE_OTHER)
Admission: RE | Admit: 2014-04-20 | Discharge: 2014-04-20 | Disposition: A | Discharge status: Home / Self Care | Payer: Medicare Other | Source: Ambulatory Visit | Attending: Family | Admitting: Family

## 2014-04-20 ENCOUNTER — Encounter: Payer: Self-pay | Admitting: Family

## 2014-04-20 ENCOUNTER — Ambulatory Visit (HOSPITAL_COMMUNITY)
Admission: RE | Admit: 2014-04-20 | Discharge: 2014-04-20 | Disposition: A | Discharge status: Home / Self Care | Payer: Medicare Other | Source: Ambulatory Visit | Attending: Family | Admitting: Family

## 2014-04-20 ENCOUNTER — Ambulatory Visit (INDEPENDENT_AMBULATORY_CARE_PROVIDER_SITE_OTHER): Payer: Medicare Other | Admitting: Family

## 2014-04-20 VITALS — BP 111/65 | HR 53 | Resp 14 | Ht 68.0 in | Wt 138.0 lb

## 2014-04-20 DIAGNOSIS — I6529 Occlusion and stenosis of unspecified carotid artery: Secondary | ICD-10-CM

## 2014-04-20 DIAGNOSIS — Z48812 Encounter for surgical aftercare following surgery on the circulatory system: Secondary | ICD-10-CM

## 2014-04-20 DIAGNOSIS — I739 Peripheral vascular disease, unspecified: Secondary | ICD-10-CM

## 2014-04-20 NOTE — Progress Notes (Signed)
[facility] [place] HISTORY AND PHYSICAL -PAD  History of Present Illness [name] is a 68 y.o. male patient of Dr. Myra Gianotti who is status post distal left superficial femoral to posterior tibial artery bypass graft with ipsilateral non-reversed, translocated saphenous vein, and left fifth toe amputation with metatarsal head on 01/28/2013. This was done in the setting of an ischemic left fifth toe. He then had left PTA of posterior tibial artery on 10/05/13.  Patient returns today for follow up of PAD and minimal carotid artery stenosis.  Pt states that he continues to perform leg exercises several times/day while sitting.  Since the 10/05/2013 angioplasty of his left posterior tibial artery, patient state he has had no further resting pain in his left leg.  He denies claudication symptoms in his legs with walking, denies non healing wounds.  Reports that he has 2 pinched nerves in his lumbar spine that limits his walking due to pain, his worst complaint is right low back pain with radiculopathy and RA pain. He is pending back surgery for possibly August or September, 2015, Dr. Channing Mutters, neurosurgeon. He was hospitalized at [facility] in May, 2015 for 4 days days for pneumonia, acute renal failure, dehydration, and c.diff.    Pt Diabetic: "borderline", no DM medications  Pt smoker: former smoker, quit in 1967  Pt meds include:  Statin :Yes  Betablocker: Yes  ASA: Yes  Other anticoagulants/antiplatelets: no    Past Medical History  Diagnosis Date  . 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 Hyperlipidemia   . Hypertension   . PVD (peripheral vascular disease)     Left iliac PCI/stent  . Cerebrovascular disease   . Tobacco abuse, in remission     Remote  . GERD (gastroesophageal reflux disease)   . Hypothyroid   . Allergic rhinitis   . Chronic lung disease     ?  Rheumatoid lung; ?  Adverse reaction to methotrexate  . Schatzki's  ring   . Hx of Clostridium difficile infection     Recurrent; associated 40 pound weight loss  . Achalasia   . Hiatal hernia   . Diabetes mellitus     borderline no meds  . Atrial fibrillation   . Chronic back pain   . 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 Cervical spondylosis   . DDD (degenerative disc disease), lumbar   . Nephrolithiasis 2010    s/p lithotripsy  . Pneumonia   . Carotid artery occlusion     Social History History  Substance Use Topics  . Smoking status: Former Smoker -- 0.50 packs/day for 5 years    Types: Cigarettes    Quit date: 09/23/1965  . Smokeless tobacco: Former Neurosurgeon  . Alcohol Use: No    Family History Family History  Problem Relation Age of Onset  . Stomach cancer Mother 9  . Cancer Mother     Stomach  . Heart disease Mother   . Heart attack Mother   . Heart attack Father   . Heart disease Father   . Heart disease Brother   . Leukemia Brother   . Lung disease Son     infant  . Lung disease Daughter     infant    Past Surgical History  Procedure Laterality Date  . Coronary artery bypass graft      x6 In 1998  . Total knee arthroplasty      Right  .  Ankle fusion      Left  . Wrist fusion      Right  . Shoulder surgery    . Colonoscopy  09/12/2011    Dr. Elly Modena hemorrhoids, normal colon and distal terminal ileum. Random bx negative. Stool for CDiff positive.  . Esophagogastroduodenoscopy  09/13/2011    Dr. Lyndle Herrlich plawues mid-esophagus KOH negative. Distal esophageal ring and ulcer. Mild gastritis. duodenal diverticulum. Savaory dilation 47mm.   Gaspar Bidding dilation  09/13/2011  . Esophageal manometry  09/30/2011    Procedure: ESOPHAGEAL MANOMETRY (EM);  Surgeon: Rob Bunting, MD;  Location: [facility];  Service: Endoscopy;  Laterality: N/A;  . Esophagogastroduodenoscopy  09/12/2011    Dr. Rinaldo Ratel rings s/p dilation  . Cardiac surgery    . Esophagomyotomy  11/12/11    [facility]- with DOR  antireflux surgery  . Esophagogastroduodenoscopy  06/25/2012    Procedure: ESOPHAGOGASTRODUODENOSCOPY (EGD);  Surgeon: Corbin Ade, MD;  Location: [facility];  Service: Endoscopy;  Laterality: N/A;  7:30  . Savory dilation  06/25/2012    Procedure: SAVORY DILATION;  Surgeon: Corbin Ade, MD;  Location: [facility];  Service: Endoscopy;  Laterality: N/A;  Elease Hashimoto dilation  06/25/2012    Procedure: Elease Hashimoto DILATION;  Surgeon: Corbin Ade, MD;  Location: [facility];  Service: Endoscopy;  Laterality: N/A;  . Ankle fusion  09/01/2012    Procedure: ANKLE FUSION;  Surgeon: Valeria Batman, MD;  Location: [facility] OR;  Service: Orthopedics;  Laterality: Left;  Take down of angulated tibial/fibula Fractures  . Femoral-tibial bypass graft Left 01/28/2013    Procedure: BYPASS GRAFT FEMORAL-TIBIAL ARTERY;  Surgeon: Nada Libman, MD;  Location: [facility] OR;  Service: Vascular;  Laterality: Left;  Ultrasound Guided  . Amputation Left 01/28/2013    Procedure: AMPUTATION LEFT 5TH TOE;  Surgeon: Nada Libman, MD;  Location: [facility] OR;  Service: Vascular;  Laterality: Left;  . Joint replacement Right 1999    Knee  . Spine surgery  11-03-13    Allergies  Allergen Reactions  . Clindamycin/Lincomycin Other (See Comments)    Kidney Failure  . Lisinopril Other (See Comments)    Kidney failure   . Amoxicillin Hives and Other (See Comments)    Hallucinations   . Ciprofloxacin Other (See Comments)    C-diff  . Doxycycline Nausea And Vomiting  . Levofloxacin Nausea And Vomiting    Current Outpatient Prescriptions  Medication Sig Dispense Refill  . ALPRAZolam (XANAX) 0.5 MG tablet Take 1 tablet (0.5 mg total) by mouth daily as needed for anxiety.  30 tablet  5  . aspirin EC 81 MG tablet Take 81 mg by mouth daily.        Marland Kitchen atenolol (TENORMIN) 25 MG tablet Take 1 tablet (25 mg total) by mouth 2 (two) times daily.  60 tablet  5  . atorvastatin (LIPITOR) 80 MG tablet TAKE 1 TABLET BY MOUTH EVERY DAY  90  tablet  2  . B Complex Vitamins (VITAMIN B COMPLEX PO) Take 1 tablet by mouth daily.      . Dutasteride-Tamsulosin HCl (JALYN) 0.5-0.4 MG CAPS Take 1 capsule by mouth daily.       Marland Kitchen HYDROcodone-acetaminophen (NORCO) 10-325 MG per tablet Take 1 tablet up to five times daily  150 tablet  0  . ibuprofen (ADVIL,MOTRIN) 800 MG tablet Take 800 mg by mouth 2 (two) times daily as needed.       . leflunomide (ARAVA) 20 MG tablet Take 20 mg  by mouth daily.      Marland Kitchen levothyroxine (SYNTHROID, LEVOTHROID) 100 MCG tablet Take 100 mcg by mouth daily before breakfast.      . lisinopril (PRINIVIL,ZESTRIL) 20 MG tablet Take 1 tablet (20 mg total) by mouth daily.  30 tablet  5  . Melatonin 3 MG TABS Take 2 tablets by mouth at bedtime.       . Multiple Vitamin (MULTIVITAMIN) capsule Take 1 capsule by mouth daily.      . Potassium Gluconate 595 MG CAPS Take 1 capsule by mouth daily.      . predniSONE (DELTASONE) 10 MG tablet TAKE 1 TABLET BY MOUTH EVERY DAY  30 tablet  5   No current facility-administered medications for this visit.    ROS: See HPI for pertinent positives and negatives.   Physical Examination  Filed Vitals:   04/20/14 1339 04/20/14 1341  BP: 113/65 111/65  Pulse: 52 53  Resp:  14  Height:  5\' 8"  (1.727 m)  Weight:  138 lb (62.596 kg)  SpO2:  98%   Body mass index is 20.99 kg/(m^2).  General: A&O x 3, WDWN,  Gait: slow, deliberate Eyes: Pupils are equal  Pulmonary: respirations are non labored Cardiac: regular Rythm  Carotid Bruits  Left  Right    Negative  Positive   Aorta: is not palpable  Radial pulses: are 2+ and =   VASCULAR EXAM:  Extremities without ischemic changes  without Gangrene; without open wounds. Right great toe is no longer red.   LE Pulses  LEFT  RIGHT   FEMORAL  palpable  palpable   POPLITEAL  not palpable  not palpable   POSTERIOR TIBIAL  2+ palpable  not palpable   DORSALIS PEDIS  ANTERIOR TIBIAL  not palpable  not palpable   PERONEAL  not Palpable   not Palpable   Abdomen: soft, NT, no masses.  Skin: no rashes, no ulcers noted.  Musculoskeletal: some generalized muscle atrophy, RA deformities noted in hands, severe kyphosis.  Neurologic: A&O X 3; Appropriate Affect ; SENSATION: normal; MOTOR FUNCTION: moving all extremities equally, motor strength 4/5 throughout. Speech is fluent/normal. CN 2-12 intact.   Non-Invasive Vascular Imaging: DATE: 04/20/2014 LOWER EXTREMITY ARTERIAL DUPLEX EVALUATION    INDICATION: Bypass graft evaluation.    PREVIOUS INTERVENTION(S): Left superficial femoral to posterior tibial artery bypass graft 01/01/2013. Left 5th toe amputation 01/28/2013. Left PTA of posterior tibial artery 10/05/2013.    DUPLEX EXAM:     RIGHT  LEFT   Peak Systolic Velocity (cm/s) Ratio (if abnormal) Waveform  Peak Systolic Velocity (cm/s) Ratio (if abnormal) Waveform     Inflow Artery 68  B     Proximal Anastomosis 52  B     Proximal Graft 53  B     Mid Graft 61  B      Distal Graft 66  T      Distal Anastomosis 99  T     Outflow Artery 145  T  Not performed due to calcified vessels Today's ABI / TBI Not performed due to calcified vessels  Non-compressible/ Not valid Previous ABI / TBI (02/23/2014  ) Non-compressible/ Not valid    Waveform:    M - Monophasic       B - Biphasic       T - Triphasic  If Ankle Brachial Index (ABI) or Toe Brachial Index (TBI) performed, please see complete report     ADDITIONAL FINDINGS: Right posterior tibial artery: 75cm/s Biphasic Right  dorsalis pedis artery: 72 cm/s Biphasic  Right peroneal artery 29 cm/s Monophasic    IMPRESSION: Widely patent left lower extremity bypass graft without evidence of hyperplasia or restenosis.    Compared to the previous exam:  No significant change in comparison to the last exam on 01/03/2014.   CEREBROVASCULAR DUPLEX EVALUATION    INDICATION: Carotid artery disease     PREVIOUS INTERVENTION(S):     DUPLEX EXAM:     RIGHT  LEFT  Peak Systolic  Velocities (cm/s) End Diastolic Velocities (cm/s) Plaque LOCATION Peak Systolic Velocities (cm/s) End Diastolic Velocities (cm/s) Plaque  71 14  CCA PROXIMAL 112 14   58 15  CCA MID 74 14   60 14 HT CCA DISTAL 61 17 HT  93 6 HT ECA 63 0 HT  99 28 HT ICA PROXIMAL 62 20 HT  103 30  ICA MID 56 18   95 26  ICA DISTAL 78 25     1.77 ICA / CCA Ratio (PSV) 1.05  Antegrade  Vertebral Flow Antegrade   130 Brachial Systolic Pressure (mmHg) 128  Triphasic  Brachial Artery Waveforms Triphasic     Plaque Morphology:  HM = Homogeneous, HT = Heterogeneous, CP = Calcific Plaque, SP = Smooth Plaque, IP = Irregular Plaque     ADDITIONAL FINDINGS:     IMPRESSION: Bilateral internal carotid artery velocities suggest a <40% stenosis.     Compared to the previous exam:  No significant change in comparison to the last exam on 01/06/2013.     ASSESSMENT: [name] is a 68 y.o. male who is status post distal left superficial femoral to posterior tibial artery bypass graft with ipsilateral non-reversed, translocated saphenous vein, and left fifth toe amputation with metatarsal head on 01/28/2013. This was done in the setting of an ischemic left fifth toe. He then had left PTA of posterior tibial artery on 10/05/13.  Lower extremity arterial Duplex today demonstrates a widely patent left lower extremity bypass graft without evidence of hyperplasia or restenosis. Unable to perform ABI's due to calcified vessels, however, left leg arterial waveforms are bi and triphasic, right DT and PT waveforms are biphasic. He continues to perform leg exercises daily while sitting, his walking is limited by his back issues for which he is scheduled for surgery by Dr. Channing Mutters in August or September, 2015. Carotid Duplex today demonstrates minimal bilateral ICA stenoses.    PLAN:  I discussed in depth with the patient the nature of atherosclerosis, and emphasized the importance of maximal medical management including strict  control of blood pressure, blood glucose, and lipid levels, obtaining regular exercise, and continued cessation of smoking.  The patient is aware that without maximal medical management the underlying atherosclerotic disease process will progress, limiting the benefit of any interventions.  Based on the patient's vascular studies and examination, pt will return to clinic in 6  months for ABI's and left LE arterial Duplex.  Will check carotid Duplex in 2 years.   The patient was given information about PAD including signs, symptoms, treatment, what symptoms should prompt the patient to seek immediate medical care, and risk reduction measures to take.  Charisse March, RN, MSN, FNP-C [facility] [place] Phone: [phone]  Clinic MD: Edilia Bo  04/20/2014 1:50 PM

## 2014-04-20 NOTE — Patient Instructions (Signed)

## 2014-04-26 ENCOUNTER — Other Ambulatory Visit: Payer: Self-pay | Admitting: Family Medicine

## 2014-04-26 NOTE — Telephone Encounter (Signed)
Last seen 7/8

## 2014-04-29 ENCOUNTER — Telehealth: Payer: Self-pay | Admitting: Family Medicine

## 2014-04-29 ENCOUNTER — Other Ambulatory Visit: Payer: Self-pay | Admitting: *Deleted

## 2014-04-29 MED ORDER — IBUPROFEN 800 MG PO TABS: 800.0000 mg | ORAL_TABLET | Freq: Two times a day (BID) | ORAL | Status: DC | PRN

## 2014-04-29 NOTE — Telephone Encounter (Signed)
Med was sent to pharm on 08/04. Resent med today. Pt notified.

## 2014-04-29 NOTE — Telephone Encounter (Signed)
ibuprofen (ADVIL,MOTRIN) 800 MG tablet  Please resend this,  [facility] is telling this pt  That we denied this refill

## 2014-05-19 ENCOUNTER — Ambulatory Visit: Payer: Medicare Other | Admitting: Family Medicine

## 2014-06-09 ENCOUNTER — Ambulatory Visit: Payer: Medicare Other | Admitting: Nurse Practitioner

## 2014-07-04 ENCOUNTER — Ambulatory Visit (INDEPENDENT_AMBULATORY_CARE_PROVIDER_SITE_OTHER): Payer: Medicare Other | Admitting: Family Medicine

## 2014-07-04 ENCOUNTER — Encounter: Payer: Self-pay | Admitting: Family Medicine

## 2014-07-04 VITALS — BP 138/86 | Ht 68.0 in | Wt 143.4 lb

## 2014-07-04 DIAGNOSIS — Z125 Encounter for screening for malignant neoplasm of prostate: Secondary | ICD-10-CM

## 2014-07-04 DIAGNOSIS — I2510 Atherosclerotic heart disease of native coronary artery without angina pectoris: Secondary | ICD-10-CM

## 2014-07-04 DIAGNOSIS — E785 Hyperlipidemia, unspecified: Secondary | ICD-10-CM

## 2014-07-04 DIAGNOSIS — E119 Type 2 diabetes mellitus without complications: Secondary | ICD-10-CM

## 2014-07-04 DIAGNOSIS — Z23 Encounter for immunization: Secondary | ICD-10-CM

## 2014-07-04 DIAGNOSIS — I10 Essential (primary) hypertension: Secondary | ICD-10-CM

## 2014-07-04 MED ORDER — HYDROCODONE-ACETAMINOPHEN 10-325 MG PO TABS: ORAL_TABLET | ORAL | Status: DC

## 2014-07-04 NOTE — Progress Notes (Signed)
   Subjective:    Patient ID: [name], male    DOB: 1946-08-17, 68 y.o.   MRN: [id number]  Hypertension This is a new problem. The current episode started more than 1 year ago. Risk factors for coronary artery disease include male gender and dyslipidemia. Treatments tried: lisinopril, atenolol. There are no compliance problems.    Patient also claims compliance with his lipid medication. No obvious side effects.  Recently diagnosed with type 2 diabetes. No longer checking his sugars. Trying to watch his diet.  Recently had low back surgery. States overall improved.    Review of Systems No chest pain no abdominal pain no change in bowel habits no blood in stool no rash ROS otherwise negative    Objective:   Physical Exam  Alert blood pressure good on repeat vital signs stable HEENT normal. Lungs clear. Heart regular in rhythm. Ankles without edema.      Assessment & Plan:  Impression 1 hypertension good control. #2 hyperlipidemia control uncertain. #3 type 2 diabetes control uncertain. Plan diet exercise discussed. Maintain medications refilled. Vaccines discussed administered. Followup as scheduled. WSL

## 2014-07-06 LAB — HEPATIC FUNCTION PANEL
ALT: 21 U/L (ref 0–53)
AST: 23 U/L (ref 0–37)
Albumin: 3.7 g/dL (ref 3.5–5.2)
Alkaline Phosphatase: 119 U/L — ABNORMAL HIGH (ref 39–117)
BILIRUBIN DIRECT: 0.1 mg/dL (ref 0.0–0.3)
Indirect Bilirubin: 0.3 mg/dL (ref 0.2–1.2)
Total Bilirubin: 0.4 mg/dL (ref 0.2–1.2)
Total Protein: 6.2 g/dL (ref 6.0–8.3)

## 2014-07-06 LAB — HEMOGLOBIN A1C
HEMOGLOBIN A1C: 6.4 % — AB (ref <5.7)
MEAN PLASMA GLUCOSE: 137 mg/dL — AB (ref <117)

## 2014-07-06 LAB — LIPID PANEL
CHOL/HDL RATIO: 4.3 ratio
Cholesterol: 226 mg/dL — ABNORMAL HIGH (ref 0–200)
HDL: 52 mg/dL (ref >39)
LDL CALC: 95 mg/dL (ref 0–99)
Triglycerides: 395 mg/dL — ABNORMAL HIGH (ref <150)
VLDL: 79 mg/dL — AB (ref 0–40)

## 2014-07-07 LAB — PSA, MEDICARE: PSA: 2.46 ng/mL (ref <=4.00)

## 2014-07-07 LAB — MICROALBUMIN, URINE: Microalb, Ur: 5.7 mg/dL — ABNORMAL HIGH (ref <2.0)

## 2014-07-11 ENCOUNTER — Other Ambulatory Visit: Payer: Self-pay | Admitting: Family Medicine

## 2014-08-24 ENCOUNTER — Other Ambulatory Visit: Payer: Self-pay | Admitting: Family Medicine

## 2014-08-25 NOTE — Telephone Encounter (Signed)
Ok plus 5 ref 

## 2014-09-01 ENCOUNTER — Encounter (HOSPITAL_COMMUNITY): Payer: Self-pay | Admitting: Vascular Surgery

## 2014-09-02 ENCOUNTER — Ambulatory Visit (INDEPENDENT_AMBULATORY_CARE_PROVIDER_SITE_OTHER): Payer: Medicare Other | Admitting: Nurse Practitioner

## 2014-09-02 ENCOUNTER — Telehealth: Payer: Self-pay | Admitting: Family Medicine

## 2014-09-02 ENCOUNTER — Encounter: Payer: Self-pay | Admitting: Nurse Practitioner

## 2014-09-02 VITALS — BP 122/74 | Temp 97.7°F | Ht 68.0 in | Wt 146.0 lb

## 2014-09-02 DIAGNOSIS — I2510 Atherosclerotic heart disease of native coronary artery without angina pectoris: Secondary | ICD-10-CM

## 2014-09-02 DIAGNOSIS — R062 Wheezing: Secondary | ICD-10-CM

## 2014-09-02 DIAGNOSIS — J069 Acute upper respiratory infection, unspecified: Secondary | ICD-10-CM

## 2014-09-02 MED ORDER — ALBUTEROL SULFATE HFA 108 (90 BASE) MCG/ACT IN AERS: 2.0000 | INHALATION_SPRAY | RESPIRATORY_TRACT | Status: DC | PRN

## 2014-09-02 MED ORDER — PREDNISONE 20 MG PO TABS: ORAL_TABLET | ORAL | Status: DC

## 2014-09-02 MED ORDER — AZITHROMYCIN 250 MG PO TABS: ORAL_TABLET | ORAL | Status: DC

## 2014-09-02 NOTE — Telephone Encounter (Signed)
We cannot get PA this that quickly. Also, tell pharmacy to dispense least expensive albuterol name brand; there should be a preferred product.

## 2014-09-02 NOTE — Telephone Encounter (Signed)
Patient's insurance covers Ventolin HFA- Med called into [facility]. Patient notified.

## 2014-09-02 NOTE — Telephone Encounter (Signed)
Patients insurance is not covering albuterol (PROVENTIL HFA;VENTOLIN HFA) 108 (90 BASE) MCG/ACT inhaler without prior authorization and he does not want to wait that long.  Can we change this inhaler?   [facility]

## 2014-09-02 NOTE — Telephone Encounter (Signed)
Please call when complete.

## 2014-09-04 ENCOUNTER — Encounter: Payer: Self-pay | Admitting: Nurse Practitioner

## 2014-09-04 NOTE — Progress Notes (Signed)
Subjective:  Presents for c/o cough, wheezing and SOB x 5 d. No fever or chest pain. Frequent cough. Sore throat. No ear pain. Reflux stable. No edema.   Objective:   BP 122/74 mmHg  Temp(Src) 97.7 F (36.5 C) (Oral)  Ht 5\' 8"  (1.727 m)  Wt 146 lb (66.225 kg)  BMI 22.20 kg/m2 NAD. Alert, active. TMs clear effusion. Pharynx mild erythema with PND noted. Neck supple with mild anterior adenopathy. Lungs one faint expiratory wheeze anterior left otherwise clear. Heart RRR. No tachypnea. Normal color. Lower extremities no edema.   Assessment:  Plan:  Meds ordered this encounter  Medications  . azithromycin (ZITHROMAX Z-PAK) 250 MG tablet    Sig: Take 2 tablets (500 mg) on  Day 1,  followed by 1 tablet (250 mg) once daily on Days 2 through 5.    Dispense:  6 each    Refill:  0    Order Specific Question:  Supervising Provider    Answer:  [[id number]]  . predniSONE (DELTASONE) 20 MG tablet    Sig: One po BID x 5 d    Dispense:  10 tablet    Refill:  0    Order Specific Question:  Supervising Provider    Answer:  Merlyn Albert [[id number]]  . albuterol (PROVENTIL HFA;VENTOLIN HFA) 108 (90 BASE) MCG/ACT inhaler    Sig: Inhale 2 puffs into the lungs every 4 (four) hours as needed for wheezing or shortness of breath.    Dispense:  1 Inhaler    Refill:  2    Order Specific Question:  Supervising Provider    Answer:  Merlyn Albert [[id number]]   Increase prednisone dose for 5 days then resume normal dose.  Call back in 3-4 days if no improvement, sooner if worse.

## 2014-09-05 ENCOUNTER — Ambulatory Visit (INDEPENDENT_AMBULATORY_CARE_PROVIDER_SITE_OTHER): Payer: Medicare Other | Admitting: Nurse Practitioner

## 2014-09-05 ENCOUNTER — Encounter: Payer: Self-pay | Admitting: Nurse Practitioner

## 2014-09-05 VITALS — BP 130/80 | Temp 97.9°F | Ht 68.0 in | Wt 144.1 lb

## 2014-09-05 DIAGNOSIS — R05 Cough: Secondary | ICD-10-CM

## 2014-09-05 DIAGNOSIS — R053 Chronic cough: Secondary | ICD-10-CM

## 2014-09-05 DIAGNOSIS — B370 Candidal stomatitis: Secondary | ICD-10-CM

## 2014-09-05 DIAGNOSIS — I2510 Atherosclerotic heart disease of native coronary artery without angina pectoris: Secondary | ICD-10-CM

## 2014-09-05 HISTORY — PX: TOE AMPUTATION: SHX809

## 2014-09-05 MED ORDER — BENZONATATE 100 MG PO CAPS: 100.0000 mg | ORAL_CAPSULE | Freq: Three times a day (TID) | ORAL | Status: DC | PRN

## 2014-09-05 MED ORDER — FLUCONAZOLE 100 MG PO TABS: ORAL_TABLET | ORAL | Status: DC

## 2014-09-05 NOTE — Patient Instructions (Signed)
Stop Atorvastatin while on fluconazole

## 2014-09-05 NOTE — Progress Notes (Signed)
Subjective:  Presents for recheck. Still having frequent cough. Producing thick white sputum. Right ear pain. Sore throat. Misunderstood directions for prednisone. Only taking 20 mg once a day. Wheezing much better. No fever. Feels a "knot" in this throat. No reflux or abdominal pain. Also rash in roof of his mouth started right after visit 3 days ago. Taking fluids well.  Objective:   BP 130/80 mmHg  Temp(Src) 97.9 F (36.6 C) (Oral)  Ht 5\' 8"  (1.727 m)  Wt 144 lb 2 oz (65.375 kg)  BMI 21.92 kg/m2 NAD. Alert, oriented. Rt TM clear effusion. Left TM normal. Mouth and pharynx: faint white patches soft palate with erythema. Clear PND. Neck supple with mild anterior adenopathy. Lungs clear, BS improved. Heart RRR. Abdomen soft, non tender.   Assessment: Persistent cough - Plan: DG Chest 2 View  Oral candidiasis  Plan:  Meds ordered this encounter  Medications  . fluconazole (DIFLUCAN) 100 MG tablet    Sig: 2 po today then one po qd x 14 d    Dispense:  16 tablet    Refill:  0    Order Specific Question:  Supervising Provider    Answer:  [[id number]]  . benzonatate (TESSALON) 100 MG capsule    Sig: Take 1 capsule (100 mg total) by mouth 3 (three) times daily as needed for cough.    Dispense:  30 capsule    Refill:  0    Order Specific Question:  Supervising Provider    Answer:  Merlyn Albert [[id number]]   Stop Lipitor while on fluconazole; given written and verbal instructions; verbalizes understanding. CXR pending. Call back by end of week if no improvement, sooner if worse. Go back to usual dose of prednisone.

## 2014-09-07 ENCOUNTER — Ambulatory Visit (HOSPITAL_COMMUNITY)
Admission: RE | Admit: 2014-09-07 | Discharge: 2014-09-07 | Disposition: A | Discharge status: Home / Self Care | Payer: Medicare Other | Source: Ambulatory Visit | Attending: Nurse Practitioner | Admitting: Nurse Practitioner

## 2014-09-07 DIAGNOSIS — R05 Cough: Secondary | ICD-10-CM | POA: Insufficient documentation

## 2014-09-07 DIAGNOSIS — R918 Other nonspecific abnormal finding of lung field: Secondary | ICD-10-CM | POA: Diagnosis not present

## 2014-09-09 NOTE — Addendum Note (Signed)
Addended by: Margaretha Sheffield on: 09/09/2014 09:24 AM   Modules accepted: Orders

## 2014-09-12 ENCOUNTER — Other Ambulatory Visit: Payer: Self-pay | Admitting: Family Medicine

## 2014-09-20 ENCOUNTER — Ambulatory Visit (HOSPITAL_COMMUNITY)
Admission: RE | Admit: 2014-09-20 | Discharge: 2014-09-20 | Disposition: A | Discharge status: Home / Self Care | Payer: Medicare Other | Source: Ambulatory Visit | Attending: Nurse Practitioner | Admitting: Nurse Practitioner

## 2014-09-20 DIAGNOSIS — R05 Cough: Secondary | ICD-10-CM | POA: Diagnosis not present

## 2014-09-20 DIAGNOSIS — J189 Pneumonia, unspecified organism: Secondary | ICD-10-CM | POA: Insufficient documentation

## 2014-09-20 DIAGNOSIS — I4891 Unspecified atrial fibrillation: Secondary | ICD-10-CM | POA: Diagnosis not present

## 2014-09-20 DIAGNOSIS — E119 Type 2 diabetes mellitus without complications: Secondary | ICD-10-CM | POA: Diagnosis not present

## 2014-09-20 DIAGNOSIS — Z951 Presence of aortocoronary bypass graft: Secondary | ICD-10-CM | POA: Diagnosis not present

## 2014-09-21 ENCOUNTER — Telehealth: Payer: Self-pay | Admitting: Family Medicine

## 2014-09-21 NOTE — Telephone Encounter (Signed)
Pt had xray of chest done 12/29 and wants to know what the results are  So he knows if he can continue with a procedure he has scheduled for next Tuesday

## 2014-09-22 NOTE — Telephone Encounter (Signed)
Notified patient of results (see result notes). Advised patient whoever is doing the procedure needs to be aware of xray results. Patient verbalized understanding.

## 2014-09-22 NOTE — Telephone Encounter (Signed)
LMRC

## 2014-09-22 NOTE — Telephone Encounter (Signed)
See result report. Whoever is doing the procedure needs to be aware of xray results. If in [facility] system, they can pull them up. They are on his my chart account.

## 2014-10-04 ENCOUNTER — Ambulatory Visit (INDEPENDENT_AMBULATORY_CARE_PROVIDER_SITE_OTHER): Payer: Medicare Other | Admitting: Family Medicine

## 2014-10-04 ENCOUNTER — Other Ambulatory Visit: Payer: Self-pay | Admitting: Family Medicine

## 2014-10-04 ENCOUNTER — Encounter: Payer: Self-pay | Admitting: Family Medicine

## 2014-10-04 VITALS — BP 112/74 | Temp 97.8°F | Ht 68.0 in | Wt 142.0 lb

## 2014-10-04 DIAGNOSIS — I10 Essential (primary) hypertension: Secondary | ICD-10-CM

## 2014-10-04 DIAGNOSIS — G894 Chronic pain syndrome: Secondary | ICD-10-CM

## 2014-10-04 DIAGNOSIS — E785 Hyperlipidemia, unspecified: Secondary | ICD-10-CM

## 2014-10-04 DIAGNOSIS — E119 Type 2 diabetes mellitus without complications: Secondary | ICD-10-CM

## 2014-10-04 LAB — POCT GLYCOSYLATED HEMOGLOBIN (HGB A1C): HEMOGLOBIN A1C: 6.7

## 2014-10-04 MED ORDER — HYDROCODONE-ACETAMINOPHEN 10-325 MG PO TABS: ORAL_TABLET | ORAL | Status: DC

## 2014-10-04 MED ORDER — CLARITHROMYCIN 500 MG PO TABS: 500.0000 mg | ORAL_TABLET | Freq: Two times a day (BID) | ORAL | Status: DC

## 2014-10-04 MED ORDER — ALPRAZOLAM 1 MG PO TABS: ORAL_TABLET | ORAL | Status: DC

## 2014-10-04 NOTE — Progress Notes (Signed)
   Subjective:    Patient ID: [name], male    DOB: 04/28/1946, 69 y.o.   MRN: [id number]  Diabetes He presents for his follow-up diabetic visit. He has type 2 diabetes mellitus. He is following a diabetic diet. He never participates in exercise. His breakfast blood glucose range is generally 70-90 mg/dl. He sees a podiatrist.Eye exam is current.   Concerns about cough and shortness of breath for the past couple days. Cough coming back, recently treated for pneumonia. iusing the inhaler just on a prn basis. Cough is productive yellowish phlegm no high fevers. Under multi therapies for arthritis but not improving  BMs overall good with reg narcotics  Needs refill on hydrocodone.  Back pain. Sees Dr. Channing Mutters. States she definitely needs a hydrocodone without it he could not function.  Trouble sleeping at night. Taking xanax and melatonin for sleep. 0.5 mg Xanax not doing the trick as far as helping to sleep.   Review of Systems No headache no chest pain no change in bowel habits no blood in stool no rash ROS otherwise negative    Objective:   Physical Exam Alert no acute distress vital stable HEENT moderate his congestion lungs no crackles no wheezes no tachypnea heart regular in rhythm. Significant low back tenderness. Significant changes associated with rheumatoid arthritis ankles without edema       Assessment & Plan:  Impression 1 chronic pain discussed #2 reemergence of bronchitis #3 insomnia discussed #4 diabetes decent control plan diet exercise discussed. Antibiotics prescribed. Pain medicine prescribed. Albuterol when necessary. Warning signs discussed. Follow-up as scheduled. WSL

## 2014-10-07 ENCOUNTER — Other Ambulatory Visit: Payer: Self-pay | Admitting: Family Medicine

## 2014-10-11 ENCOUNTER — Other Ambulatory Visit: Payer: Self-pay | Admitting: *Deleted

## 2014-10-11 MED ORDER — ATENOLOL 25 MG PO TABS: ORAL_TABLET | ORAL | Status: DC

## 2014-10-17 ENCOUNTER — Ambulatory Visit: Payer: Medicare Other | Admitting: Family

## 2014-10-17 ENCOUNTER — Other Ambulatory Visit (HOSPITAL_COMMUNITY): Payer: Medicare Other

## 2014-10-17 ENCOUNTER — Encounter (HOSPITAL_COMMUNITY): Payer: Medicare Other

## 2014-10-20 ENCOUNTER — Encounter: Payer: Self-pay | Admitting: Family

## 2014-10-21 ENCOUNTER — Ambulatory Visit (HOSPITAL_COMMUNITY)
Admission: RE | Admit: 2014-10-21 | Discharge: 2014-10-21 | Disposition: A | Discharge status: Home / Self Care | Payer: Medicare Other | Source: Ambulatory Visit | Attending: Family | Admitting: Family

## 2014-10-21 ENCOUNTER — Ambulatory Visit (INDEPENDENT_AMBULATORY_CARE_PROVIDER_SITE_OTHER): Payer: Medicare Other | Admitting: Family

## 2014-10-21 ENCOUNTER — Encounter: Payer: Self-pay | Admitting: Family

## 2014-10-21 VITALS — BP 138/76 | HR 63 | Resp 16 | Ht 68.0 in | Wt 146.0 lb

## 2014-10-21 DIAGNOSIS — I739 Peripheral vascular disease, unspecified: Secondary | ICD-10-CM

## 2014-10-21 DIAGNOSIS — I6523 Occlusion and stenosis of bilateral carotid arteries: Secondary | ICD-10-CM

## 2014-10-21 DIAGNOSIS — Z48812 Encounter for surgical aftercare following surgery on the circulatory system: Secondary | ICD-10-CM | POA: Diagnosis present

## 2014-10-21 DIAGNOSIS — I70302 Unspecified atherosclerosis of unspecified type of bypass graft(s) of the extremities, left leg: Secondary | ICD-10-CM | POA: Insufficient documentation

## 2014-10-21 DIAGNOSIS — Z95828 Presence of other vascular implants and grafts: Secondary | ICD-10-CM

## 2014-10-21 DIAGNOSIS — Z87891 Personal history of nicotine dependence: Secondary | ICD-10-CM

## 2014-10-21 DIAGNOSIS — Z9889 Other specified postprocedural states: Secondary | ICD-10-CM

## 2014-10-21 NOTE — Patient Instructions (Signed)
Peripheral Vascular Disease Peripheral Vascular Disease (PVD), also called Peripheral Arterial Disease (PAD), is a circulation problem caused by cholesterol (atherosclerotic plaque) deposits in the arteries. PVD commonly occurs in the lower extremities (legs) but it can occur in other areas of the body, such as your arms. The cholesterol buildup in the arteries reduces blood flow which can cause pain and other serious problems. The presence of PVD can place a person at risk for Coronary Artery Disease (CAD).  CAUSES  Causes of PVD can be many. It is usually associated with more than one risk factor such as:   High Cholesterol.  Smoking.  Diabetes.  Lack of exercise or inactivity.  High blood pressure (hypertension).  Obesity.  Family history. SYMPTOMS   When the lower extremities are affected, patients with PVD may experience:  Leg pain with exertion or physical activity. This is called INTERMITTENT CLAUDICATION. This may present as cramping or numbness with physical activity. The location of the pain is associated with the level of blockage. For example, blockage at the abdominal level (distal abdominal aorta) may result in buttock or hip pain. Lower leg arterial blockage may result in calf pain.  As PVD becomes more severe, pain can develop with less physical activity.  In people with severe PVD, leg pain may occur at rest.  Other PVD signs and symptoms:  Leg numbness or weakness.  Coldness in the affected leg or foot, especially when compared to the other leg.  A change in leg color.  Patients with significant PVD are more prone to ulcers or sores on toes, feet or legs. These may take longer to heal or may reoccur. The ulcers or sores can become infected.  If signs and symptoms of PVD are ignored, gangrene may occur. This can result in the loss of toes or loss of an entire limb.  Not all leg pain is related to PVD. Other medical conditions can cause leg pain such  as:  Blood clots (embolism) or Deep Vein Thrombosis.  Inflammation of the blood vessels (vasculitis).  Spinal stenosis. DIAGNOSIS  Diagnosis of PVD can involve several different types of tests. These can include:  Pulse Volume Recording Method (PVR). This test is simple, painless and does not involve the use of X-rays. PVR involves measuring and comparing the blood pressure in the arms and legs. An ABI (Ankle-Brachial Index) is calculated. The normal ratio of blood pressures is 1. As this number becomes smaller, it indicates more severe disease.  < 0.95 - indicates significant narrowing in one or more leg vessels.  <0.8 - there will usually be pain in the foot, leg or buttock with exercise.  <0.4 - will usually have pain in the legs at rest.  <0.25 - usually indicates limb threatening PVD.  Doppler detection of pulses in the legs. This test is painless and checks to see if you have a pulses in your legs/feet.  A dye or contrast material (a substance that highlights the blood vessels so they show up on x-ray) may be given to help your caregiver better see the arteries for the following tests. The dye is eliminated from your body by the kidney's. Your caregiver may order blood work to check your kidney function and other laboratory values before the following tests are performed:  Magnetic Resonance Angiography (MRA). An MRA is a picture study of the blood vessels and arteries. The MRA machine uses a large magnet to produce images of the blood vessels.  Computed Tomography Angiography (CTA). A CTA   is a specialized x-ray that looks at how the blood flows in your blood vessels. An IV may be inserted into your arm so contrast dye can be injected.  Angiogram. Is a procedure that uses x-rays to look at your blood vessels. This procedure is minimally invasive, meaning a small incision (cut) is made in your groin. A small tube (catheter) is then inserted into the artery of your groin. The catheter  is guided to the blood vessel or artery your caregiver wants to examine. Contrast dye is injected into the catheter. X-rays are then taken of the blood vessel or artery. After the images are obtained, the catheter is taken out. TREATMENT  Treatment of PVD involves many interventions which may include:  Lifestyle changes:  Quitting smoking.  Exercise.  Following a low fat, low cholesterol diet.  Control of diabetes.  Foot care is very important to the PVD patient. Good foot care can help prevent infection.  Medication:  Cholesterol-lowering medicine.  Blood pressure medicine.  Anti-platelet drugs.  Certain medicines may reduce symptoms of Intermittent Claudication.  Interventional/Surgical options:  Angioplasty. An Angioplasty is a procedure that inflates a balloon in the blocked artery. This opens the blocked artery to improve blood flow.  Stent Implant. A wire mesh tube (stent) is placed in the artery. The stent expands and stays in place, allowing the artery to remain open.  Peripheral Bypass Surgery. This is a surgical procedure that reroutes the blood around a blocked artery to help improve blood flow. This type of procedure may be performed if Angioplasty or stent implants are not an option. SEEK IMMEDIATE MEDICAL CARE IF:   You develop pain or numbness in your arms or legs.  Your arm or leg turns cold, becomes blue in color.  You develop redness, warmth, swelling and pain in your arms or legs. MAKE SURE YOU:   Understand these instructions.  Will watch your condition.  Will get help right away if you are not doing well or get worse. Document Released: 10/17/2004 Document Revised: 12/02/2011 Document Reviewed: 09/13/2008 ExitCare Patient Information 2015 [employer]. This information is not intended to replace advice given to you by your health care provider. Make sure you discuss any questions you have with your health care provider.   Stroke  Prevention Some medical conditions and behaviors are associated with an increased chance of having a stroke. You may prevent a stroke by making healthy choices and managing medical conditions. HOW CAN I REDUCE MY RISK OF HAVING A STROKE?   Stay physically active. Get at least 30 minutes of activity on most or all days.  Do not smoke. It may also be helpful to avoid exposure to secondhand smoke.  Limit alcohol use. Moderate alcohol use is considered to be:  No more than 2 drinks per day for men.  No more than 1 drink per day for nonpregnant women.  Eat healthy foods. This involves:  Eating 5 or more servings of fruits and vegetables a day.  Making dietary changes that address high blood pressure (hypertension), high cholesterol, diabetes, or obesity.  Manage your cholesterol levels.  Making food choices that are high in fiber and low in saturated fat, trans fat, and cholesterol may control cholesterol levels.  Take any prescribed medicines to control cholesterol as directed by your health care provider.  Manage your diabetes.  Controlling your carbohydrate and sugar intake is recommended to manage diabetes.  Take any prescribed medicines to control diabetes as directed by your health care provider.    Control your hypertension.  Making food choices that are low in salt (sodium), saturated fat, trans fat, and cholesterol is recommended to manage hypertension.  Take any prescribed medicines to control hypertension as directed by your health care provider.  Maintain a healthy weight.  Reducing calorie intake and making food choices that are low in sodium, saturated fat, trans fat, and cholesterol are recommended to manage weight.  Stop drug abuse.  Avoid taking birth control pills.  Talk to your health care provider about the risks of taking birth control pills if you are over 35 years old, smoke, get migraines, or have ever had a blood clot.  Get evaluated for sleep  disorders (sleep apnea).  Talk to your health care provider about getting a sleep evaluation if you snore a lot or have excessive sleepiness.  Take medicines only as directed by your health care provider.  For some people, aspirin or blood thinners (anticoagulants) are helpful in reducing the risk of forming abnormal blood clots that can lead to stroke. If you have the irregular heart rhythm of atrial fibrillation, you should be on a blood thinner unless there is a good reason you cannot take them.  Understand all your medicine instructions.  Make sure that other conditions (such as anemia or atherosclerosis) are addressed. SEEK IMMEDIATE MEDICAL CARE IF:   You have sudden weakness or numbness of the face, arm, or leg, especially on one side of the body.  Your face or eyelid droops to one side.  You have sudden confusion.  You have trouble speaking (aphasia) or understanding.  You have sudden trouble seeing in one or both eyes.  You have sudden trouble walking.  You have dizziness.  You have a loss of balance or coordination.  You have a sudden, severe headache with no known cause.  You have new chest pain or an irregular heartbeat. Any of these symptoms may represent a serious problem that is an emergency. Do not wait to see if the symptoms will go away. Get medical help at once. Call your local emergency services (911 in [place] Do not drive yourself to the hospital. Document Released: 10/17/2004 Document Revised: 01/24/2014 Document Reviewed: 03/12/2013 ExitCare Patient Information 2015 [employer]. This information is not intended to replace advice given to you by your health care provider. Make sure you discuss any questions you have with your health care provider.  

## 2014-10-21 NOTE — Progress Notes (Signed)
[facility] [place] HISTORY AND PHYSICAL -PAD  History of Present Illness Nihal D Zamorski is a 69 y.o. male patient of Dr. Myra Gianotti who is status post distal left superficial femoral to posterior tibial artery bypass graft with ipsilateral non-reversed, translocated saphenous vein, and left fifth toe amputation with metatarsal head on 01/28/2013. This was done in the setting of an ischemic left fifth toe. He then had left PTA of posterior tibial artery on 10/05/13.  Patient returns today for follow up of PAD and minimal carotid artery stenosis.  Pt states that he continues to perform leg exercises several times/day while sitting.  Since the 10/05/2013 angioplasty of his left posterior tibial artery, patient state he has had no further resting pain in his left leg.  He denies claudication symptoms in his legs with walking, denies non healing wounds.  Reports that he has 2 pinched nerves in his lumbar spine that limits his walking due to pain, his worst complaint is right low back pain with radiculopathy and RA pain. He had back surgery September, 2015, Dr. Channing Mutters, neurosurgeon. He was hospitalized at [facility] in May, 2015 for 4 days days for pneumonia, acute renal failure, dehydration, and c.diff.  He will have a cyst excised at his left elbow to help relieve tingling and numbness in left hand, Dr. Cleophas Dunker.    Pt Diabetic: "borderline", no DM medications  Pt smoker: former smoker, quit in 1967  Pt meds include:  Statin :Yes  Betablocker: Yes  ASA: Yes  Other anticoagulants/antiplatelets: no     Past Medical History  Diagnosis Date  . 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 Hyperlipidemia   . Hypertension   . PVD (peripheral vascular disease)     Left iliac PCI/stent  . Cerebrovascular disease   . Tobacco abuse, in remission     Remote  . GERD (gastroesophageal reflux disease)   . Hypothyroid   . Allergic rhinitis   .  Chronic lung disease     ?  Rheumatoid lung; ?  Adverse reaction to methotrexate  . Schatzki's ring   . Hx of Clostridium difficile infection     Recurrent; associated 40 pound weight loss  . Achalasia   . Hiatal hernia   . Diabetes mellitus     borderline no meds  . Atrial fibrillation   . Chronic back pain   . 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 Cervical spondylosis   . DDD (degenerative disc disease), lumbar   . Nephrolithiasis 2010    s/p lithotripsy  . Pneumonia   . Carotid artery occlusion     Social History History  Substance Use Topics  . Smoking status: Former Smoker -- 0.50 packs/day for 5 years    Types: Cigarettes    Quit date: 09/23/1965  . Smokeless tobacco: Former Neurosurgeon  . Alcohol Use: No    Family History Family History  Problem Relation Age of Onset  . Stomach cancer Mother 58  . Cancer Mother     Stomach  . Heart disease Mother   . Heart attack Mother   . Heart attack Father   . Heart disease Father   . Heart disease Brother   . Leukemia Brother   . Lung disease Son     infant  . Lung disease Daughter     infant    Past Surgical History  Procedure Laterality Date  . Coronary artery bypass graft  x6 In 1998  . Total knee arthroplasty      Right  . Ankle fusion      Left  . Wrist fusion      Right  . Shoulder surgery    . Colonoscopy  09/12/2011    Dr. Elly Modena hemorrhoids, normal colon and distal terminal ileum. Random bx negative. Stool for CDiff positive.  . Esophagogastroduodenoscopy  09/13/2011    Dr. Lyndle Herrlich plawues mid-esophagus KOH negative. Distal esophageal ring and ulcer. Mild gastritis. duodenal diverticulum. Savaory dilation 16mm.   Gaspar Bidding dilation  09/13/2011  . Esophageal manometry  09/30/2011    Procedure: ESOPHAGEAL MANOMETRY (EM);  Surgeon: Rob Bunting, MD;  Location: [facility] ENDOSCOPY;  Service: Endoscopy;  Laterality: N/A;  . Esophagogastroduodenoscopy  09/12/2011    Dr.  Rinaldo Ratel rings s/p dilation  . Cardiac surgery    . Esophagomyotomy  11/12/11    [facility]- with DOR antireflux surgery  . Esophagogastroduodenoscopy  06/25/2012    Procedure: ESOPHAGOGASTRODUODENOSCOPY (EGD);  Surgeon: Corbin Ade, MD;  Location: [facility] SUITE;  Service: Endoscopy;  Laterality: N/A;  7:30  . Savory dilation  06/25/2012    Procedure: SAVORY DILATION;  Surgeon: Corbin Ade, MD;  Location: [facility];  Service: Endoscopy;  Laterality: N/A;  Elease Hashimoto dilation  06/25/2012    Procedure: Elease Hashimoto DILATION;  Surgeon: Corbin Ade, MD;  Location: [facility] SU[facility];  Service: Endoscopy;  Laterality: N/A;  . Ankle fusion  09/01/2012    Procedure: ANKLE FUSION;  Surgeon: Valeria Batman, MD;  Location: [facility] OR;  Service: Orthopedics;  Laterality: Left;  Take down of angulated tibial/fibula Fractures  . Femoral-tibial bypass graft Left 01/28/2013    Procedure: BYPASS GRAFT FEMORAL-TIBIAL ARTERY;  Surgeon: Nada Libman, MD;  Location: [facility] OR;  Service: Vascular;  Laterality: Left;  Ultrasound Guided  . Amputation Left 01/28/2013    Procedure: AMPUTATION LEFT 5TH TOE;  Surgeon: Nada Libman, MD;  Location: [facility] OR;  Service: Vascular;  Laterality: Left;  . Joint replacement Right 1999    Knee  . Spine surgery  11-03-13  . Abdominal aortagram N/A 01/01/2013    Procedure: ABDOMINAL Ronny Flurry;  Surgeon: Sherren Kerns, MD;  Location: [facility] CATH LAB;  Service: Cardiovascular;  Laterality: N/A;  . Abdominal aortagram N/A 10/05/2013    Procedure: ABDOMINAL AORTAGRAM;  Surgeon: Nada Libman, MD;  Location: [facility] CATH LAB;  Service: Cardiovascular;  Laterality: N/A;  . Lower extremity angiogram Left 10/05/2013    Procedure: LOWER EXTREMITY ANGIOGRAM;  Surgeon: Nada Libman, MD;  Location: [facility] CATH LAB;  Service: Cardiovascular;  Laterality: Left;    Allergies  Allergen Reactions  . Clindamycin/Lincomycin Other (See Comments)    Kidney Failure  . Lisinopril Other (See Comments)    Kidney  failure   . Amoxicillin Hives and Other (See Comments)    Hallucinations   . Ciprofloxacin Other (See Comments)    C-diff  . Doxycycline Nausea And Vomiting  . Levofloxacin Nausea And Vomiting    Current Outpatient Prescriptions  Medication Sig Dispense Refill  . albuterol (PROVENTIL HFA;VENTOLIN HFA) 108 (90 BASE) MCG/ACT inhaler Inhale 2 puffs into the lungs every 4 (four) hours as needed for wheezing or shortness of breath. 1 Inhaler 2  . ALPRAZolam (XANAX) 1 MG tablet TAKE 1 TABLET BY MOUTH EVERY DAY AS NEEDED FOR ANXIETY 30 tablet 5  . aspirin EC 81 MG tablet Take 81 mg by mouth daily.      Marland Kitchen atenolol (TENORMIN)  25 MG tablet TAKE (1) TABLET BY MOUTH TWICE DAILY. 60 tablet 5  . atorvastatin (LIPITOR) 80 MG tablet TAKE 1 TABLET BY MOUTH EVERY DAY 90 tablet 2  . B Complex Vitamins (VITAMIN B COMPLEX PO) Take 1 tablet by mouth daily.    . clarithromycin (BIAXIN) 500 MG tablet Take 1 tablet (500 mg total) by mouth 2 (two) times daily. 20 tablet 0  . Dutasteride-Tamsulosin HCl (JALYN) 0.5-0.4 MG CAPS Take 1 capsule by mouth daily.     Marland Kitchen HYDROcodone-acetaminophen (NORCO) 10-325 MG per tablet Take 1 tablet up to five times daily 150 tablet 0  . ibuprofen (ADVIL,MOTRIN) 800 MG tablet TAKE 1 TABLET BY MOUTH THREE TIMES DAILY 270 tablet 0  . leflunomide (ARAVA) 20 MG tablet Take 20 mg by mouth daily.    Marland Kitchen levothyroxine (SYNTHROID, LEVOTHROID) 100 MCG tablet TAKE 1 TABLET BY MOUTH EVERY DAY 90 tablet 1  . lisinopril (PRINIVIL,ZESTRIL) 20 MG tablet Take 1 tablet (20 mg total) by mouth daily. 30 tablet 5  . Melatonin 3 MG TABS Take 2 tablets by mouth at bedtime.     . Multiple Vitamin (MULTIVITAMIN) capsule Take 1 capsule by mouth daily.    . Potassium Gluconate 595 MG CAPS Take 1 capsule by mouth daily.    . predniSONE (DELTASONE) 10 MG tablet TAKE ONE TABLET BY MOUTH ONCE DAILY. 30 tablet 6   No current facility-administered medications for this visit.    ROS: See HPI for pertinent positives  and negatives.   Physical Examination  Filed Vitals:   10/21/14 1402  BP: 138/76  Pulse: 63  Resp: 16  Height: 5\' 8"  (1.727 m)  Weight: 146 lb (66.225 kg)   Body mass index is 22.2 kg/(m^2).  General: A&O x 3, WDWN  Gait: slow, deliberate Eyes: Pupils are equal  Pulmonary: respirations are non labored Cardiac: regular Rythm   Carotid Bruits  Left  Right    Negative  Positive   Aorta: is not palpable  Radial pulses: are 2+ and =   VASCULAR EXAM:  Extremities without ischemic changes  without Gangrene; without open wounds. Right great toe is no longer red.  LE Pulses  LEFT  RIGHT   FEMORAL  palpable  palpable   POPLITEAL  not palpable  not palpable   POSTERIOR TIBIAL  not palpable, monophasic by Doppler  not palpable, monophasic by Doppler   DORSALIS PEDIS  ANTERIOR TIBIAL  faintly palpable, biphasic by Doppler  not palpable, monophasic by Doppler    Abdomen: soft, NT, no palpable masses.  Skin: no rashes, no ulcers.  Musculoskeletal: some generalized muscle atrophy, RA deformities noted in hands, severe kyphosis, left leg shorter than right, both ankles with limited ROM.  Neurologic: A&O X 3; Appropriate Affect ; SENSATION: normal; MOTOR FUNCTION: moving all extremities equally, motor strength 4/5 throughout. Speech is fluent/normal.  CN 2-12 intact.    Non-Invasive Vascular Imaging: DATE: 10/21/2014 LOWER EXTREMITY ARTERIAL DUPLEX EVALUATION    INDICATION: Follow-up left distal superficial femoral artery- posterior tibial artery graft    PREVIOUS INTERVENTION(S): Left lower extremity graft placed 01/01/2013 with angioplasty of distal anastomosis 10/05/2013    DUPLEX EXAM:     RIGHT  LEFT   Peak Systolic Velocity (cm/s) Ratio (if abnormal) Waveform  Peak Systolic Velocity (cm/s) Ratio (if abnormal) Waveform     Inflow Artery 83  B     Proximal Anastomosis 63  B     Proximal Graft 71  B     Mid  Graft 73  B      Distal Graft  149448 3.1 B     Distal Anastomosis 97  B     Outflow Artery 82  B  Not completed Today's ABI / TBI Not completed   Previous ABI / TBI (  )     Waveform:    M - Monophasic       B - Biphasic       T - Triphasic  If Ankle Brachial Index (ABI) or Toe Brachial Index (TBI) performed, please see complete report     ADDITIONAL FINDINGS: Ankle brachial index not performed due to long-standing arterial calcification with non-compressible vessels.    IMPRESSION: 1. Non-hemodynamically significant disease (<50%) observed in the proximal anastomosis. 2. Significant (>70%) stenosis observed in the distal graft approximately 6 cm above the distal anastomosis. This appears to be a retained valve.    Compared to the previous exam:  New stenosis of the distal graft.     ASSESSMENT: [name] is a 69 y.o. male who is status post distal left superficial femoral to posterior tibial artery bypass graft with ipsilateral non-reversed, translocated saphenous vein, and left fifth toe amputation with metatarsal head on 01/28/2013. This was done in the setting of an ischemic left fifth toe. He then had left PTA of posterior tibial artery on 10/05/13.  He also has a hx of asymptomatic minimal carotid artery stenoses. His severe RA limits his walking, he does not report claudication symptoms, no tissue loss; he continues seated leg exercises.  Left LE arterial Duplex today reveals non-hemodynamically significant disease (<50%) observed in the proximal anastomosis. Significant (>70%) stenosis observed in the distal graft approximately 6 cm above the distal anastomosis. This appears to be a retained valve. New stenosis of the distal graft. Ankle brachial index not performed due to long-standing arterial calcification with non-compressible vessels.  Face to face time with patient was 25 minutes. Over 50% of this time was spent on counseling and coordination of care.   PLAN:  I discussed in depth with the  patient the nature of atherosclerosis, and emphasized the importance of maximal medical management including strict control of blood pressure, blood glucose, and lipid levels, obtaining regular exercise, and continued cessation of smoking.  The patient is aware that without maximal medical management the underlying atherosclerotic disease process will progress, limiting the benefit of any interventions.  Based on the patient's vascular studies and examination, and after discussing with Dr. Imogene Burn, pt will return to clinic in 3 days to see Dr. Myra Gianotti to discuss options re new stenosis of the distal graft. Carotid Duplex scheduled for July 2017.  The patient was given information about PAD including signs, symptoms, treatment, what symptoms should prompt the patient to seek immediate medical care, and risk reduction measures to take.  Charisse March, RN, MSN, FNP-C [facility] [place] Phone: [phone]  Clinic MD: Imogene Burn  10/21/2014  2:42 PM

## 2014-10-24 ENCOUNTER — Ambulatory Visit (INDEPENDENT_AMBULATORY_CARE_PROVIDER_SITE_OTHER): Payer: Medicare Other | Admitting: Surgery

## 2014-10-24 ENCOUNTER — Encounter: Payer: Self-pay | Admitting: Surgery

## 2014-10-24 ENCOUNTER — Other Ambulatory Visit: Payer: Self-pay

## 2014-10-24 VITALS — BP 169/86 | HR 66 | Ht 68.0 in | Wt 145.0 lb

## 2014-10-24 DIAGNOSIS — Z95828 Presence of other vascular implants and grafts: Secondary | ICD-10-CM

## 2014-10-24 DIAGNOSIS — I6523 Occlusion and stenosis of bilateral carotid arteries: Secondary | ICD-10-CM

## 2014-10-24 DIAGNOSIS — Z9889 Other specified postprocedural states: Secondary | ICD-10-CM

## 2014-10-24 NOTE — Patient Instructions (Signed)

## 2014-10-24 NOTE — Progress Notes (Signed)
Patient name: [name] MRN: [id number] DOB: [birth year] Sex: male     Chief Complaint  Patient presents with  . Re-evaluation    discuss options for restenosis of distal graft    HISTORY OF PRESENT ILLNESS: The patient comes in today for follow-up.  He was seen by the nurse practitioner 3 days ago.  He is status post distal left superficial femoral to posterior tibial artery bypass graft with ipsilateral non-reversed translocated saphenous vein on 01/28/2013.  He also underwent fifth toe amputation.  On 10/05/2013, he underwent angioplasty of the posterior tibial artery for stenosis.  He was seen in the office on Friday and found to have a stenosis within the vein graft with elevated velocities.  He is here for further discussions.  He is asymptomatic.  Past Medical History  Diagnosis Date  . 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 Hyperlipidemia   . Hypertension   . PVD (peripheral vascular disease)     Left iliac PCI/stent  . Cerebrovascular disease   . Tobacco abuse, in remission     Remote  . GERD (gastroesophageal reflux disease)   . Hypothyroid   . Allergic rhinitis   . Chronic lung disease     ?  Rheumatoid lung; ?  Adverse reaction to methotrexate  . Schatzki's ring   . Hx of Clostridium difficile infection     Recurrent; associated 40 pound weight loss  . Achalasia   . Hiatal hernia   . Diabetes mellitus     borderline no meds  . Atrial fibrillation   . Chronic back pain   . 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 Cervical spondylosis   . DDD (degenerative disc disease), lumbar   . Nephrolithiasis 2010    s/p lithotripsy  . Pneumonia   . Carotid artery occlusion     Past Surgical History  Procedure Laterality Date  . Coronary artery bypass graft      x6 In 1998  . Total knee arthroplasty      Right  . Ankle fusion      Left  . Wrist fusion      Right  . Shoulder surgery    . Colonoscopy  09/12/2011    Dr.  Elly Modena hemorrhoids, normal colon and distal terminal ileum. Random bx negative. Stool for CDiff positive.  . Esophagogastroduodenoscopy  09/13/2011    Dr. Lyndle Herrlich plawues mid-esophagus KOH negative. Distal esophageal ring and ulcer. Mild gastritis. duodenal diverticulum. Savaory dilation 47mm.   Gaspar Bidding dilation  09/13/2011  . Esophageal manometry  09/30/2011    Procedure: ESOPHAGEAL MANOMETRY (EM);  Surgeon: Rob Bunting, MD;  Location: [facility];  Service: Endoscopy;  Laterality: N/A;  . Esophagogastroduodenoscopy  09/12/2011    Dr. Rinaldo Ratel rings s/p dilation  . Cardiac surgery    . Esophagomyotomy  11/12/11    [facility]- with DOR antireflux surgery  . Esophagogastroduodenoscopy  06/25/2012    Procedure: ESOPHAGOGASTRODUODENOSCOPY (EGD);  Surgeon: Corbin Ade, MD;  [facility];  Service: Endoscopy;  Laterality: N/A;  7:30  . Savory dilation  06/25/2012    Procedure: SAVORY DILATION;  Surgeon: Corbin Ade, MD;  [facility];  Service: Endoscopy;  Laterality: N/A;  Elease Hashimoto dilation  06/25/2012    Procedure: Elease Hashimoto DILATION;  Surgeon: Corbin Ade, MD;  [facility];  Service: Endoscopy;  Laterality: N/A;  . Ankle fusion  09/01/2012    Procedure:  ANKLE FUSION;  Surgeon: Peter W Whitfield, MD;  Location: [facility] OR;  Service: Orthopedics;  Laterality: Left;  Take down of angulated tibial/fibula Fractures  . Femoral-tibial bypass graft Left 01/28/2013    Procedure: BYPASS GRAFT FEMORAL-TIBIAL ARTERY;  Surgeon: Savaughn Karwowski W Laxmi Choung, MD;  Location: [facility] OR;  Service: Vascular;  Laterality: Left;  Ultrasound Guided  . Amputation Left 01/28/2013    Procedure: AMPUTATION LEFT 5TH TOE;  Surgeon: Jovaun Levene W Deitrich Steve, MD;  Location: [facility] OR;  Service: Vascular;  Laterality: Left;  . Joint replacement Right 1999    Knee  . Spine surgery  11-03-13  . Abdominal aortagram N/A 01/01/2013    Procedure: ABDOMINAL AORTAGRAM;  Surgeon: Charles E Fields, MD;  [facility];  Service: Cardiovascular;  Laterality: N/A;  . Abdominal aortagram N/A 10/05/2013    Procedure: ABDOMINAL AORTAGRAM;  Surgeon: Willford Rabideau W Chasty Randal, MD;  [facility]: [facility];  Service: Cardiovascular;  Laterality: N/A;  . Lower extremity angiogram Left 10/05/2013    Procedure: LOWER EXTREMITY ANGIOGRAM;  Surgeon: Marise Knapper W Timur Nibert, MD;  [facility]: [facility];  Service: Cardiovascular;  Laterality: Left;    History   Social History  . Marital Status: Married    Spouse Name: N/A    Number of Children: 1  . Years of Education: N/A   Occupational History  . retired [employer]    Social History Main Topics  . Smoking status: Former Smoker -- 0.50 packs/day for 5 years    Types: Cigarettes    Quit date: 09/23/1965  . Smokeless tobacco: Former User  . Alcohol Use: No  . Drug Use: No  . Sexual Activity: Not on file   Other Topics Concern  . Not on file   Social History Narrative    Family History  Problem Relation Age of Onset  . Stomach cancer Mother 48  . Cancer Mother     Stomach  . Heart disease Mother   . Heart attack Mother   . Heart attack Father   . Heart disease Father   . Heart disease Brother   . Leukemia Brother   . Lung disease Son     infant  . Lung disease Daughter     infant    Allergies as of 10/24/2014 - Review Complete 10/24/2014  Allergen Reaction Noted  . Clindamycin/lincomycin Other (See Comments) 02/23/2014  . Lisinopril Other (See Comments) 02/23/2014  . Amoxicillin Hives and Other (See Comments) 08/24/2011  . Ciprofloxacin Other (See Comments) 01/21/2013  . Doxycycline Nausea And Vomiting 06/05/2011  . Levofloxacin Nausea And Vomiting 03/07/2008    Current Outpatient Prescriptions on File Prior to Visit  Medication Sig Dispense Refill  . albuterol (PROVENTIL HFA;VENTOLIN HFA) 108 (90 BASE) MCG/ACT inhaler Inhale 2 puffs into the lungs every 4 (four) hours as needed for wheezing or shortness of breath. 1 Inhaler 2  .  ALPRAZolam (XANAX) 1 MG tablet TAKE 1 TABLET BY MOUTH EVERY DAY AS NEEDED FOR ANXIETY 30 tablet 5  . aspirin EC 81 MG tablet Take 81 mg by mouth daily.      . atenolol (TENORMIN) 25 MG tablet TAKE (1) TABLET BY MOUTH TWICE DAILY. 60 tablet 5  . atorvastatin (LIPITOR) 80 MG tablet TAKE 1 TABLET BY MOUTH EVERY DAY 90 tablet 2  . B Complex Vitamins (VITAMIN B COMPLEX PO) Take 1 tablet by mouth daily.    . clarithromycin (BIAXIN) 500 MG tablet Take 1 tablet (500 mg total) by mouth 2 (two) times daily.   20 tablet 0  . Dutasteride-Tamsulosin HCl (JALYN) 0.5-0.4 MG CAPS Take 1 capsule by mouth daily.     Marland Kitchen HYDROcodone-acetaminophen (NORCO) 10-325 MG per tablet Take 1 tablet up to five times daily 150 tablet 0  . ibuprofen (ADVIL,MOTRIN) 800 MG tablet TAKE 1 TABLET BY MOUTH THREE TIMES DAILY 270 tablet 0  . leflunomide (ARAVA) 20 MG tablet Take 20 mg by mouth daily.    Marland Kitchen levothyroxine (SYNTHROID, LEVOTHROID) 100 MCG tablet TAKE 1 TABLET BY MOUTH EVERY DAY 90 tablet 1  . lisinopril (PRINIVIL,ZESTRIL) 20 MG tablet Take 1 tablet (20 mg total) by mouth daily. 30 tablet 5  . Melatonin 3 MG TABS Take 2 tablets by mouth at bedtime.     . Multiple Vitamin (MULTIVITAMIN) capsule Take 1 capsule by mouth daily.    . Potassium Gluconate 595 MG CAPS Take 1 capsule by mouth daily.    . predniSONE (DELTASONE) 10 MG tablet TAKE ONE TABLET BY MOUTH ONCE DAILY. 30 tablet 6   No current facility-administered medications on file prior to visit.     REVIEW OF SYSTEMS: No changes were visit 3 days ago  PHYSICAL EXAMINATION:   Vital signs are BP 169/86 mmHg  Pulse 66  Ht 5\' 8"  (1.727 m)  Wt 145 lb (65.772 kg)  BMI 22.05 kg/m2  SpO2 96% General: The patient appears their stated age. HEENT:  No gross abnormalities Pulmonary:  Non labored breathing Musculoskeletal: There are no major deformities. Neurologic: No focal weakness or paresthesias are detected, Skin: There are no ulcer or rashes noted. Psychiatric: The  patient has normal affect. Cardiovascular: There is a regular rate and rhythm without significant murmur appreciated.   Diagnostic Studies Elevated velocities within left femoral posterior tibial bypass  Assessment: Bypass graft stenosis Plan: Patient has been scheduled for angiography via a right femoral approach to evaluate the left leg and the vein graft stenosis with possible intervention.  He will be a good candidate for the SAFE registry.  This is been scheduled for Tuesday, February 9  V. 08-13-1980, M.D. [facility] Office: [phone] Pager:  [phone]

## 2014-10-27 ENCOUNTER — Encounter (HOSPITAL_COMMUNITY): Payer: Self-pay | Admitting: Pharmacy Technician

## 2014-10-31 MED ORDER — SODIUM CHLORIDE 0.9 % IV SOLN
INTRAVENOUS | Status: DC
  Administered 2014-11-01: 09:00:00 via INTRAVENOUS

## 2014-11-01 ENCOUNTER — Ambulatory Visit (HOSPITAL_COMMUNITY)
Admission: RE | Admit: 2014-11-01 | Discharge: 2014-11-01 | Disposition: A | Discharge status: Home / Self Care | Payer: Medicare Other | Source: Ambulatory Visit | Attending: Surgery | Admitting: Surgery

## 2014-11-01 ENCOUNTER — Encounter (HOSPITAL_COMMUNITY): Payer: Self-pay | Admitting: Surgery

## 2014-11-01 ENCOUNTER — Encounter (HOSPITAL_COMMUNITY)
Admission: RE | Disposition: A | Discharge status: Home / Self Care | Payer: Self-pay | Source: Ambulatory Visit | Attending: Surgery

## 2014-11-01 DIAGNOSIS — M069 Rheumatoid arthritis, unspecified: Secondary | ICD-10-CM | POA: Insufficient documentation

## 2014-11-01 DIAGNOSIS — E785 Hyperlipidemia, unspecified: Secondary | ICD-10-CM | POA: Diagnosis not present

## 2014-11-01 DIAGNOSIS — K219 Gastro-esophageal reflux disease without esophagitis: Secondary | ICD-10-CM | POA: Diagnosis not present

## 2014-11-01 DIAGNOSIS — E119 Type 2 diabetes mellitus without complications: Secondary | ICD-10-CM | POA: Insufficient documentation

## 2014-11-01 DIAGNOSIS — T82858A Stenosis of vascular prosthetic devices, implants and grafts, initial encounter: Secondary | ICD-10-CM | POA: Diagnosis present

## 2014-11-01 DIAGNOSIS — Z96651 Presence of right artificial knee joint: Secondary | ICD-10-CM | POA: Diagnosis not present

## 2014-11-01 DIAGNOSIS — E039 Hypothyroidism, unspecified: Secondary | ICD-10-CM | POA: Insufficient documentation

## 2014-11-01 DIAGNOSIS — I10 Essential (primary) hypertension: Secondary | ICD-10-CM | POA: Diagnosis not present

## 2014-11-01 DIAGNOSIS — Z87891 Personal history of nicotine dependence: Secondary | ICD-10-CM | POA: Diagnosis not present

## 2014-11-01 DIAGNOSIS — Y832 Surgical operation with anastomosis, bypass or graft as the cause of abnormal reaction of the patient, or of later complication, without mention of misadventure at the time of the procedure: Secondary | ICD-10-CM | POA: Insufficient documentation

## 2014-11-01 DIAGNOSIS — Z7982 Long term (current) use of aspirin: Secondary | ICD-10-CM | POA: Insufficient documentation

## 2014-11-01 DIAGNOSIS — Z951 Presence of aortocoronary bypass graft: Secondary | ICD-10-CM | POA: Diagnosis not present

## 2014-11-01 DIAGNOSIS — I70401 Unspecified atherosclerosis of autologous vein bypass graft(s) of the extremities, right leg: Secondary | ICD-10-CM

## 2014-11-01 DIAGNOSIS — I4891 Unspecified atrial fibrillation: Secondary | ICD-10-CM | POA: Diagnosis not present

## 2014-11-01 HISTORY — PX: ABDOMINAL AORTAGRAM: SHX5454

## 2014-11-01 LAB — POCT I-STAT, CHEM 8
BUN: 18 mg/dL (ref 6–23)
CREATININE: 1.1 mg/dL (ref 0.50–1.35)
Calcium, Ion: 0.9 mmol/L — ABNORMAL LOW (ref 1.13–1.30)
Chloride: 112 mmol/L (ref 96–112)
Glucose, Bld: 92 mg/dL (ref 70–99)
HCT: 37 % — ABNORMAL LOW (ref 39.0–52.0)
Hemoglobin: 12.6 g/dL — ABNORMAL LOW (ref 13.0–17.0)
Potassium: 3.5 mmol/L (ref 3.5–5.1)
SODIUM: 144 mmol/L (ref 135–145)
TCO2: 18 mmol/L (ref 0–100)

## 2014-11-01 SURGERY — ABDOMINAL AORTAGRAM

## 2014-11-01 MED ORDER — ACETAMINOPHEN 325 MG RE SUPP
325.0000 mg | RECTAL | Status: DC | PRN
  Filled 2014-11-01: qty 2

## 2014-11-01 MED ORDER — ONDANSETRON HCL 4 MG/2ML IJ SOLN: 4.0000 mg | Freq: Four times a day (QID) | INTRAMUSCULAR | Status: DC | PRN

## 2014-11-01 MED ORDER — ACETAMINOPHEN 325 MG PO TABS
325.0000 mg | ORAL_TABLET | ORAL | Status: DC | PRN
  Filled 2014-11-01: qty 2

## 2014-11-01 MED ORDER — GUAIFENESIN-DM 100-10 MG/5ML PO SYRP
15.0000 mL | ORAL_SOLUTION | ORAL | Status: DC | PRN
  Filled 2014-11-01: qty 15

## 2014-11-01 MED ORDER — SODIUM CHLORIDE 0.9 % IV SOLN
1.0000 mL/kg/h | INTRAVENOUS | Status: DC
  Administered 2014-11-01: 1 mL/kg/h via INTRAVENOUS

## 2014-11-01 MED ORDER — HEPARIN (PORCINE) IN NACL 2-0.9 UNIT/ML-% IJ SOLN
INTRAMUSCULAR | Status: AC
Start: 2014-11-01 — End: 2014-11-01
  Filled 2014-11-01: qty 1000

## 2014-11-01 MED ORDER — MIDAZOLAM HCL 2 MG/2ML IJ SOLN
INTRAMUSCULAR | Status: AC
  Filled 2014-11-01: qty 2

## 2014-11-01 MED ORDER — HYDRALAZINE HCL 20 MG/ML IJ SOLN: 5.0000 mg | INTRAMUSCULAR | Status: DC | PRN

## 2014-11-01 MED ORDER — MORPHINE SULFATE 10 MG/ML IJ SOLN: 2.0000 mg | INTRAMUSCULAR | Status: DC | PRN

## 2014-11-01 MED ORDER — ALUM & MAG HYDROXIDE-SIMETH 200-200-20 MG/5ML PO SUSP
15.0000 mL | ORAL | Status: DC | PRN
  Filled 2014-11-01: qty 30

## 2014-11-01 MED ORDER — OXYCODONE HCL 5 MG PO TABS
ORAL_TABLET | ORAL | Status: AC
  Filled 2014-11-01: qty 1

## 2014-11-01 MED ORDER — LIDOCAINE HCL (PF) 1 % IJ SOLN
INTRAMUSCULAR | Status: AC
  Filled 2014-11-01: qty 30

## 2014-11-01 MED ORDER — OXYCODONE HCL 5 MG PO TABS
5.0000 mg | ORAL_TABLET | ORAL | Status: DC | PRN
  Administered 2014-11-01: 5 mg via ORAL
  Filled 2014-11-01: qty 2

## 2014-11-01 MED ORDER — PHENOL 1.4 % MT LIQD
1.0000 | OROMUCOSAL | Status: DC | PRN
  Filled 2014-11-01: qty 177

## 2014-11-01 MED ORDER — LABETALOL HCL 5 MG/ML IV SOLN
10.0000 mg | INTRAVENOUS | Status: DC | PRN
Start: 2014-11-01 — End: 2014-11-01

## 2014-11-01 MED ORDER — METOPROLOL TARTRATE 1 MG/ML IV SOLN: 2.0000 mg | INTRAVENOUS | Status: DC | PRN

## 2014-11-01 MED ORDER — FENTANYL CITRATE 0.05 MG/ML IJ SOLN
INTRAMUSCULAR | Status: AC
  Filled 2014-11-01: qty 2

## 2014-11-01 SURGICAL SUPPLY — 55 items
ADH SKN CLS APL DERMABOND .7 (GAUZE/BANDAGES/DRESSINGS) ×2
BANDAGE ELASTIC 4 VELCRO ST LF (GAUZE/BANDAGES/DRESSINGS) IMPLANT
BANDAGE ESMARK 6X9 LF (GAUZE/BANDAGES/DRESSINGS) IMPLANT
BNDG CMPR 9X6 STRL LF SNTH (GAUZE/BANDAGES/DRESSINGS)
BNDG ESMARK 6X9 LF (GAUZE/BANDAGES/DRESSINGS)
CANISTER SUCTION 2500CC (MISCELLANEOUS) ×3 IMPLANT
CLIP TI MEDIUM 24 (CLIP) ×3 IMPLANT
CLIP TI WIDE RED SMALL 24 (CLIP) ×3 IMPLANT
COVER SURGICAL LIGHT HANDLE (MISCELLANEOUS) ×3 IMPLANT
CUFF TOURNIQUET SINGLE 24IN (TOURNIQUET CUFF) IMPLANT
CUFF TOURNIQUET SINGLE 34IN LL (TOURNIQUET CUFF) IMPLANT
CUFF TOURNIQUET SINGLE 44IN (TOURNIQUET CUFF) IMPLANT
DERMABOND ADVANCED (GAUZE/BANDAGES/DRESSINGS) ×1
DERMABOND ADVANCED .7 DNX12 (GAUZE/BANDAGES/DRESSINGS) ×2 IMPLANT
DRAIN CHANNEL 15F RND FF W/TCR (WOUND CARE) IMPLANT
DRAPE WARM FLUID 44X44 (DRAPE) ×3 IMPLANT
DRAPE X-RAY CASS 24X20 (DRAPES) IMPLANT
DRSG COVADERM 4X10 (GAUZE/BANDAGES/DRESSINGS) IMPLANT
DRSG COVADERM 4X8 (GAUZE/BANDAGES/DRESSINGS) IMPLANT
ELECT REM PT RETURN 9FT ADLT (ELECTROSURGICAL) ×3
ELECTRODE REM PT RTRN 9FT ADLT (ELECTROSURGICAL) ×2 IMPLANT
EVACUATOR SILICONE 100CC (DRAIN) IMPLANT
GLOVE BIOGEL PI IND STRL 7.5 (GLOVE) ×2 IMPLANT
GLOVE BIOGEL PI INDICATOR 7.5 (GLOVE) ×1
GLOVE SURG SS PI 7.5 STRL IVOR (GLOVE) ×3 IMPLANT
GOWN PREVENTION PLUS XXLARGE (GOWN DISPOSABLE) ×3 IMPLANT
GOWN STRL NON-REIN LRG LVL3 (GOWN DISPOSABLE) ×9 IMPLANT
HEMOSTAT SNOW SURGICEL 2X4 (HEMOSTASIS) IMPLANT
KIT BASIN OR (CUSTOM PROCEDURE TRAY) ×3 IMPLANT
KIT ROOM TURNOVER OR (KITS) ×3 IMPLANT
MARKER GRAFT CORONARY BYPASS (MISCELLANEOUS) IMPLANT
NS IRRIG 1000ML POUR BTL (IV SOLUTION) ×6 IMPLANT
PACK PERIPHERAL VASCULAR (CUSTOM PROCEDURE TRAY) ×3 IMPLANT
PAD ARMBOARD 7.5X6 YLW CONV (MISCELLANEOUS) ×6 IMPLANT
PADDING CAST COTTON 6X4 STRL (CAST SUPPLIES) IMPLANT
SET COLLECT BLD 21X3/4 12 (NEEDLE) IMPLANT
STOPCOCK 4 WAY LG BORE MALE ST (IV SETS) IMPLANT
SUT ETHILON 3 0 PS 1 (SUTURE) IMPLANT
SUT PROLENE 5 0 C 1 24 (SUTURE) ×3 IMPLANT
SUT PROLENE 6 0 BV (SUTURE) ×3 IMPLANT
SUT PROLENE 7 0 BV 1 (SUTURE) IMPLANT
SUT SILK 2 0 SH (SUTURE) ×3 IMPLANT
SUT SILK 3 0 (SUTURE)
SUT SILK 3-0 18XBRD TIE 12 (SUTURE) IMPLANT
SUT VIC AB 2-0 CT1 27 (SUTURE) ×6
SUT VIC AB 2-0 CT1 TAPERPNT 27 (SUTURE) ×4 IMPLANT
SUT VIC AB 3-0 SH 27 (SUTURE) ×6
SUT VIC AB 3-0 SH 27X BRD (SUTURE) ×4 IMPLANT
SUT VICRYL 4-0 PS2 18IN ABS (SUTURE) ×6 IMPLANT
TOWEL OR 17X24 6PK STRL BLUE (TOWEL DISPOSABLE) ×6 IMPLANT
TOWEL OR 17X26 10 PK STRL BLUE (TOWEL DISPOSABLE) ×6 IMPLANT
TRAY FOLEY CATH 16FRSI W/METER (SET/KITS/TRAYS/PACK) ×3 IMPLANT
TUBING EXTENTION W/L.L. (IV SETS) IMPLANT
UNDERPAD 30X30 INCONTINENT (UNDERPADS AND DIAPERS) ×3 IMPLANT
WATER STERILE IRR 1000ML POUR (IV SOLUTION) ×3 IMPLANT

## 2014-11-01 NOTE — Op Note (Signed)
    Patient name: [name] MRN: [id number] DOB: 1946-03-05 Sex: male  11/01/2014 Pre-operative Diagnosis: Bypass graft stenosis Post-operative diagnosis:  Same Surgeon:  [name] Procedure Performed:  1.  Ultrasound-guided access, right femoral artery  2.  Abdominal aortogram  3.  Bilateral lower extremity runoff  4.  Second order catheterization  5.  Closure device     Indications:  The patient had elevated velocities identified on his duplex surveillance scan.  He is here for further evaluation and possible intervention  Procedure:  The patient was identified in the holding area and taken to room 8.  The patient was then placed supine on the table and prepped and draped in the usual sterile fashion.  A time out was called.  Ultrasound was used to evaluate the right common femoral artery.  It was patent .  A digital ultrasound image was acquired.  A micropuncture needle was used to access the right common femoral artery under ultrasound guidance.  An 018 wire was advanced without resistance and a micropuncture sheath was placed.  The 018 wire was removed and a benson wire was placed.  The micropuncture sheath was exchanged for a 5 french sheath.  An omniflush catheter was advanced over the wire to the level of L-1.  An abdominal angiogram was obtained.  Next, using the omniflush catheter and a benson wire, the aortic bifurcation was crossed and the catheter was placed into theleft external iliac artery and left runoff was obtained.  right runoff was performed via retrograde sheath injections.  Findings:   Aortogram:  No significant renal artery stenosis is identified.  The infrarenal abdominal aorta is widely patent.  No iliac artery stenosis is identified.  Right Lower Extremity:  The right common femoral and profunda femoral artery are widely patent.  The superficial femoral artery is patent there is a high-grade stenosis at the level of the adductor canal approximately 80%.   The popliteal artery is patent.  There is two-vessel runoff via the peroneal and posterior tibial artery.  The posterior tibial is the dominant runoff vessel across the ankle with a 90% stenosis at its midportion  Left Lower Extremity:  The left common femoral and profunda femoral artery are widely patent.  There is a left common femoral to below knee popliteal artery bypass graft which is patent with no evidence of proximal or distal anastomotic stenosis.  The area of concern is identified, however this is associated with approximately 40% stenosis.  I evaluated this with imaging from multiple obliques, and could not get a hemodynamically significant stenosis.  Posterior tibial artery is the dominant runoff.  Intervention:  The right groin was closed using a pro-glide device.  There was mild oozing following closure  Impression:  #1  lesion identified on ultrasound was not hemodynamically significant on angiography.  The bypass graft on the left leg is widely patent.  #2  80% stenosis on the right leg at the adductor canal.  #3  high-grade stenosis in the posterior tibial artery on the right   V. Durene Cal, M.D. [facility] Office: [phone] Pager:  [phone]

## 2014-11-01 NOTE — Interval H&P Note (Signed)
History and Physical Interval Note:  11/01/2014 9:08 AM  [name]  has presented today for surgery, with the diagnosis of pad  The various methods of treatment have been discussed with the patient and family. After consideration of risks, benefits and other options for treatment, the patient has consented to  Procedure(s): ABDOMINAL AORTAGRAM (N/A) as a surgical intervention .  The patient's history has been reviewed, patient examined, no change in status, stable for surgery.  I have reviewed the patient's chart and labs.  Questions were answered to the patient's satisfaction.     BRABHAM IV, V. WELLS

## 2014-11-01 NOTE — H&P (View-Only) (Signed)
Patient name: [name] MRN: [id number] DOB: [birth year] Sex: male     Chief Complaint  Patient presents with  . Re-evaluation    discuss options for restenosis of distal graft    HISTORY OF PRESENT ILLNESS: The patient comes in today for follow-up.  He was seen by the nurse practitioner 3 days ago.  He is status post distal left superficial femoral to posterior tibial artery bypass graft with ipsilateral non-reversed translocated saphenous vein on 01/28/2013.  He also underwent fifth toe amputation.  On 10/05/2013, he underwent angioplasty of the posterior tibial artery for stenosis.  He was seen in the office on Friday and found to have a stenosis within the vein graft with elevated velocities.  He is here for further discussions.  He is asymptomatic.  Past Medical History  Diagnosis Date  . 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 Hyperlipidemia   . Hypertension   . PVD (peripheral vascular disease)     Left iliac PCI/stent  . Cerebrovascular disease   . Tobacco abuse, in remission     Remote  . GERD (gastroesophageal reflux disease)   . Hypothyroid   . Allergic rhinitis   . Chronic lung disease     ?  Rheumatoid lung; ?  Adverse reaction to methotrexate  . Schatzki's ring   . Hx of Clostridium difficile infection     Recurrent; associated 40 pound weight loss  . Achalasia   . Hiatal hernia   . Diabetes mellitus     borderline no meds  . Atrial fibrillation   . Chronic back pain   . 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 Cervical spondylosis   . DDD (degenerative disc disease), lumbar   . Nephrolithiasis 2010    s/p lithotripsy  . Pneumonia   . Carotid artery occlusion     Past Surgical History  Procedure Laterality Date  . Coronary artery bypass graft      x6 In 1998  . Total knee arthroplasty      Right  . Ankle fusion      Left  . Wrist fusion      Right  . Shoulder surgery    . Colonoscopy  09/12/2011    Dr.  Elly Modena hemorrhoids, normal colon and distal terminal ileum. Random bx negative. Stool for CDiff positive.  . Esophagogastroduodenoscopy  09/13/2011    Dr. Lyndle Herrlich plawues mid-esophagus KOH negative. Distal esophageal ring and ulcer. Mild gastritis. duodenal diverticulum. Savaory dilation 47mm.   Gaspar Bidding dilation  09/13/2011  . Esophageal manometry  09/30/2011    Procedure: ESOPHAGEAL MANOMETRY (EM);  Surgeon: Rob Bunting, MD;  [facility]: [facility];  Service: Endoscopy;  Laterality: N/A;  . Esophagogastroduodenoscopy  09/12/2011    Dr. Rinaldo Ratel rings s/p dilation  . Cardiac surgery    . Esophagomyotomy  11/12/11    [facility]- with DOR antireflux surgery  . Esophagogastroduodenoscopy  06/25/2012    Procedure: ESOPHAGOGASTRODUODENOSCOPY (EGD);  Surgeon: Corbin Ade, MD;  [facility];  Service: Endoscopy;  Laterality: N/A;  7:30  . Savory dilation  06/25/2012    Procedure: SAVORY DILATION;  Surgeon: Corbin Ade, MD;  [facility];  Service: Endoscopy;  Laterality: N/A;  Elease Hashimoto dilation  06/25/2012    Procedure: Elease Hashimoto DILATION;  Surgeon: Corbin Ade, MD;  [facility];  Service: Endoscopy;  Laterality: N/A;  . Ankle fusion  09/01/2012    Procedure:  ANKLE FUSION;  Surgeon: Valeria Batman, MD;  Location[facility];  Service: Orthopedics;  Laterality: Left;  Take down of angulated tibial/fibula Fractures  . Femoral-tibial bypass graft Left 01/28/2013    Procedure: BYPASS GRAFT FEMORAL-TIBIAL ARTERY;  Surgeon: Nada Libman, MD;  Location: [facility] OR;  Service: Vascular;  Laterality: Left;  Ultrasound Guided  . Amputation Left 01/28/2013    Procedure: AMPUTATION LEFT 5TH TOE;  Surgeon: Nada Libman, MD;  Location: [facility] OR;  Service: Vascular;  Laterality: Left;  . Joint replacement Right 1999    Knee  . Spine surgery  11-03-13  . Abdominal aortagram N/A 01/01/2013    Procedure: ABDOMINAL Ronny Flurry;  Surgeon: Sherren Kerns, MD;  Location:  [facility] CATH LAB;  Service: Cardiovascular;  Laterality: N/A;  . Abdominal aortagram N/A 10/05/2013    Procedure: ABDOMINAL AORTAGRAM;  Surgeon: Nada Libman, MD;  Location: [facility] CATH LAB;  Service: Cardiovascular;  Laterality: N/A;  . Lower extremity angiogram Left 10/05/2013    Procedure: LOWER EXTREMITY ANGIOGRAM;  Surgeon: Nada Libman, MD;  Location: [facility] CATH LAB;  Service: Cardiovascular;  Laterality: Left;    History   Social History  . Marital Status: Married    Spouse Name: N/A    Number of Children: 1  . Years of Education: N/A   Occupational History  . retired [employer]    Social History Main Topics  . Smoking status: Former Smoker -- 0.50 packs/day for 5 years    Types: Cigarettes    Quit date: 09/23/1965  . Smokeless tobacco: Former Neurosurgeon  . Alcohol Use: No  . Drug Use: No  . Sexual Activity: Not on file   Other Topics Concern  . Not on file   Social History Narrative    Family History  Problem Relation Age of Onset  . Stomach cancer Mother 83  . Cancer Mother     Stomach  . Heart disease Mother   . Heart attack Mother   . Heart attack Father   . Heart disease Father   . Heart disease Brother   . Leukemia Brother   . Lung disease Son     infant  . Lung disease Daughter     infant    Allergies as of 10/24/2014 - Review Complete 10/24/2014  Allergen Reaction Noted  . Clindamycin/lincomycin Other (See Comments) 02/23/2014  . Lisinopril Other (See Comments) 02/23/2014  . Amoxicillin Hives and Other (See Comments) 08/24/2011  . Ciprofloxacin Other (See Comments) 01/21/2013  . Doxycycline Nausea And Vomiting 06/05/2011  . Levofloxacin Nausea And Vomiting 03/07/2008    Current Outpatient Prescriptions on File Prior to Visit  Medication Sig Dispense Refill  . albuterol (PROVENTIL HFA;VENTOLIN HFA) 108 (90 BASE) MCG/ACT inhaler Inhale 2 puffs into the lungs every 4 (four) hours as needed for wheezing or shortness of breath. 1 Inhaler 2  .  ALPRAZolam (XANAX) 1 MG tablet TAKE 1 TABLET BY MOUTH EVERY DAY AS NEEDED FOR ANXIETY 30 tablet 5  . aspirin EC 81 MG tablet Take 81 mg by mouth daily.      Marland Kitchen atenolol (TENORMIN) 25 MG tablet TAKE (1) TABLET BY MOUTH TWICE DAILY. 60 tablet 5  . atorvastatin (LIPITOR) 80 MG tablet TAKE 1 TABLET BY MOUTH EVERY DAY 90 tablet 2  . B Complex Vitamins (VITAMIN B COMPLEX PO) Take 1 tablet by mouth daily.    . clarithromycin (BIAXIN) 500 MG tablet Take 1 tablet (500 mg total) by mouth 2 (two) times daily.  20 tablet 0  . Dutasteride-Tamsulosin HCl (JALYN) 0.5-0.4 MG CAPS Take 1 capsule by mouth daily.     Marland Kitchen HYDROcodone-acetaminophen (NORCO) 10-325 MG per tablet Take 1 tablet up to five times daily 150 tablet 0  . ibuprofen (ADVIL,MOTRIN) 800 MG tablet TAKE 1 TABLET BY MOUTH THREE TIMES DAILY 270 tablet 0  . leflunomide (ARAVA) 20 MG tablet Take 20 mg by mouth daily.    Marland Kitchen levothyroxine (SYNTHROID, LEVOTHROID) 100 MCG tablet TAKE 1 TABLET BY MOUTH EVERY DAY 90 tablet 1  . lisinopril (PRINIVIL,ZESTRIL) 20 MG tablet Take 1 tablet (20 mg total) by mouth daily. 30 tablet 5  . Melatonin 3 MG TABS Take 2 tablets by mouth at bedtime.     . Multiple Vitamin (MULTIVITAMIN) capsule Take 1 capsule by mouth daily.    . Potassium Gluconate 595 MG CAPS Take 1 capsule by mouth daily.    . predniSONE (DELTASONE) 10 MG tablet TAKE ONE TABLET BY MOUTH ONCE DAILY. 30 tablet 6   No current facility-administered medications on file prior to visit.     REVIEW OF SYSTEMS: No changes were visit 3 days ago  PHYSICAL EXAMINATION:   Vital signs are BP 169/86 mmHg  Pulse 66  Ht 5\' 8"  (1.727 m)  Wt 145 lb (65.772 kg)  BMI 22.05 kg/m2  SpO2 96% General: The patient appears their stated age. HEENT:  No gross abnormalities Pulmonary:  Non labored breathing Musculoskeletal: There are no major deformities. Neurologic: No focal weakness or paresthesias are detected, Skin: There are no ulcer or rashes noted. Psychiatric: The  patient has normal affect. Cardiovascular: There is a regular rate and rhythm without significant murmur appreciated.   Diagnostic Studies Elevated velocities within left femoral posterior tibial bypass  Assessment: Bypass graft stenosis Plan: Patient has been scheduled for angiography via a right femoral approach to evaluate the left leg and the vein graft stenosis with possible intervention.  He will be a good candidate for the SAFE registry.  This is been scheduled for Tuesday, February 9  V. 08-13-1980, M.D. [facility] Office: [phone] Pager:  [phone]

## 2014-11-01 NOTE — Discharge Instructions (Signed)
Angiogram, Care After ° °Refer to this sheet in the next few weeks. These instructions provide you with information on caring for yourself after your procedure. Your health care provider may also give you more specific instructions. Your treatment has been planned according to current medical practices, but problems sometimes occur. Call your health care provider if you have any problems or questions after your procedure.  °WHAT TO EXPECT AFTER THE PROCEDURE °After your procedure, it is typical to have the following sensations: °· Minor discomfort or tenderness and a small bump at the catheter insertion site. The bump should usually decrease in size and tenderness within 1 to 2 weeks. °· Any bruising will usually fade within 2 to 4 weeks. °HOME CARE INSTRUCTIONS  °· You may need to keep taking blood thinners if they were prescribed for you. Take medicines only as directed by your health care provider. °· Do not apply powder or lotion to the site. °· Do not take baths, swim, or use a hot tub until your health care provider approves. °· You may shower 24 hours after the procedure. Remove the bandage (dressing) and gently wash the site with plain soap and water. Gently pat the site dry. °· Inspect the site at least twice daily. °· Limit your activity for the first 24 hours. Do not bend, squat, or lift anything over 10 lb (9 kg) or as directed by your health care provider. °· Plan to have someone take you home after the procedure. Follow instructions about when you can drive or return to work. °SEEK MEDICAL CARE IF: °· You get light-headed when standing up. °· You have drainage (other than a small amount of blood on the dressing). °· You have chills. °· You have a fever. °· You have redness, warmth, swelling, or pain at the insertion site. °SEEK IMMEDIATE MEDICAL CARE IF:  °· You develop chest pain or shortness of breath, feel faint, or pass out. °· You have bleeding, swelling larger than a walnut, or drainage from the  catheter insertion site. °· You develop pain, discoloration, coldness, or severe bruising in the leg or arm that held the catheter. °· You have heavy bleeding from the site. If this happens, hold pressure on the site. °MAKE SURE YOU: °· Understand these instructions. °· Will watch your condition. °· Will get help right away if you are not doing well or get worse. °Document Released: 03/28/2005 Document Revised: 01/24/2014 Document Reviewed: 02/01/2013 °Exit[employer]® Patient Information ©2015 [employer]. This information is not intended to replace advice given to you by your health care provider. Make sure you discuss any questions you have with your health care provider. ° °

## 2014-11-02 ENCOUNTER — Telehealth: Payer: Self-pay | Admitting: Surgery

## 2014-11-02 NOTE — Telephone Encounter (Signed)
-----   Message from Sharee Pimple, RN sent at 11/02/2014  9:26 AM EST ----- Regarding: Schedule Go ahead and make this appt for 6 months, if Dr. Myra Gianotti replies to my staff message with any other time frame, I will let you know.    ----- Message -----    From: Nada Libman, MD    Sent: 11/01/2014  10:38 AM      To: [facility] Charge Pool  11/01/2014:  Post-operative diagnosis:  Same Surgeon:  [name] Procedure Performed:  1.  Ultrasound-guided access, right femoral artery  2.  Abdominal aortogram  3.  Bilateral lower extremity runoff  4.  Second order catheterization  5.  Closure device    Schedule patient for follow-up with Rosalita Chessman and a bilateral lower extremity duplex with ABIs

## 2014-11-03 ENCOUNTER — Other Ambulatory Visit: Payer: Self-pay | Admitting: Family Medicine

## 2014-11-04 ENCOUNTER — Telehealth: Payer: Self-pay | Admitting: Surgery

## 2014-11-04 NOTE — Telephone Encounter (Signed)
-----   Message from Sharee Pimple, RN sent at 11/03/2014  1:35 PM EST ----- Regarding: FW: Clarification  This should be 3 months instead of 6 months, please move up  ----- Message -----    From: Nada Libman, MD    Sent: 11/03/2014   1:24 PM      To: Sharee Pimple, RN Subject: RE: Clarification                              3 ----- Message -----    From: Sharee Pimple, RN    Sent: 11/01/2014  10:55 AM      To: Nada Libman, MD Subject: Clarification                                  6 Months or 3 month followup with Rosalita Chessman?  ----- Message -----    From: Nada Libman, MD    Sent: 11/01/2014  10:38 AM      To: [facility] Charge Pool  11/01/2014:  Post-operative diagnosis:  Same Surgeon:  [name] Procedure Performed:  1.  Ultrasound-guided access, right femoral artery  2.  Abdominal aortogram  3.  Bilateral lower extremity runoff  4.  Second order catheterization  5.  Closure device    Schedule patient for follow-up with Rosalita Chessman and a bilateral lower extremity duplex with ABIs

## 2014-11-16 ENCOUNTER — Other Ambulatory Visit: Payer: Self-pay

## 2014-11-16 ENCOUNTER — Other Ambulatory Visit: Payer: Self-pay | Admitting: Cardiology

## 2014-12-02 ENCOUNTER — Ambulatory Visit (INDEPENDENT_AMBULATORY_CARE_PROVIDER_SITE_OTHER): Payer: Medicare Other | Admitting: Cardiology

## 2014-12-02 ENCOUNTER — Encounter: Payer: Self-pay | Admitting: Cardiology

## 2014-12-02 VITALS — BP 142/82 | HR 62 | Ht 68.0 in | Wt 145.2 lb

## 2014-12-02 DIAGNOSIS — I2510 Atherosclerotic heart disease of native coronary artery without angina pectoris: Secondary | ICD-10-CM

## 2014-12-02 DIAGNOSIS — E785 Hyperlipidemia, unspecified: Secondary | ICD-10-CM | POA: Diagnosis not present

## 2014-12-02 DIAGNOSIS — I10 Essential (primary) hypertension: Secondary | ICD-10-CM | POA: Diagnosis not present

## 2014-12-02 DIAGNOSIS — I6523 Occlusion and stenosis of bilateral carotid arteries: Secondary | ICD-10-CM

## 2014-12-02 NOTE — Progress Notes (Signed)
Clinical Summary [name] is a 69 y.o.male seen today for follow up of the following medical problems.   1. CAD  - prior CABG 1998 at [facility]  - last cath 10/2007 shows LM 95%, occluded LAD, LCX, and RCA. LIMA-LAD patent, SVG-OM patent, SVG to RCA occluded. Echo Jan 2012 LVEF 60-65%, grade II diastolicdysfunction   - denies any chest pain. Denies any significant SOB or DOE.  -compliant w/ meds  2. PAD  - prior SFA to popliteal bypass with SVG, with amputation of the left fifth toe  - also history of left iliac stenting  - recent procedure 09/2013 with lower extremity cath/angio, high grade stenosis tibial artery that was balloon dilated.  - followed by vascular - no recent  symptoms  3. HL  - currently on high dose atorva - 06/2014 TC 226 TG 395 HDL 52 LDL 95  4. HTN - checks at home once a week 120-140s/60-70 - compliant with meds  Past Medical History  Diagnosis Date  . 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 Hyperlipidemia   . Hypertension   . PVD (peripheral vascular disease)     Left iliac PCI/stent  . Cerebrovascular disease   . Tobacco abuse, in remission     Remote  . GERD (gastroesophageal reflux disease)   . Hypothyroid   . Allergic rhinitis   . Chronic lung disease     ?  Rheumatoid lung; ?  Adverse reaction to methotrexate  . Schatzki's ring   . Hx of Clostridium difficile infection     Recurrent; associated 40 pound weight loss  . Achalasia   . Hiatal hernia   . Diabetes mellitus     borderline no meds  . Atrial fibrillation   . Chronic back pain   . 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 Cervical spondylosis   . DDD (degenerative disc disease), lumbar   . Nephrolithiasis 2010    s/p lithotripsy  . Pneumonia   . Carotid artery occlusion      Allergies  Allergen Reactions  . Clindamycin/Lincomycin Other (See Comments)    Kidney Failure  . Lisinopril Other (See Comments)    Kidney  failure   . Amoxicillin Hives and Other (See Comments)    Hallucinations   . Ciprofloxacin Other (See Comments)    C-diff  . Other Hives, Diarrhea and Other (See Comments)    "Mycin" Causes C-diff  . Doxycycline Nausea And Vomiting  . Levofloxacin Nausea And Vomiting     Current Outpatient Prescriptions  Medication Sig Dispense Refill  . albuterol (PROVENTIL HFA;VENTOLIN HFA) 108 (90 BASE) MCG/ACT inhaler Inhale 2 puffs into the lungs every 4 (four) hours as needed for wheezing or shortness of breath. 1 Inhaler 2  . ALPRAZolam (XANAX) 1 MG tablet TAKE 1 TABLET BY MOUTH EVERY DAY AS NEEDED FOR ANXIETY (Patient taking differently: Take 0.5-1 mg by mouth at bedtime as needed for anxiety. ) 30 tablet 5  . aspirin EC 81 MG tablet Take 81 mg by mouth daily.      Marland Kitchen atenolol (TENORMIN) 25 MG tablet TAKE (1) TABLET BY MOUTH TWICE DAILY. (Patient taking differently: Take 25 mg by mouth 2 (two) times daily. ) 60 tablet 5  . atorvastatin (LIPITOR) 80 MG tablet TAKE ONE TABLET BY MOUTH ONCE DAILY. 30 tablet 0  . B Complex Vitamins (VITAMIN B COMPLEX PO) Take 1 tablet by mouth daily.    . clarithromycin (  BIAXIN) 500 MG tablet Take 1 tablet (500 mg total) by mouth 2 (two) times daily. (Patient not taking: Reported on 10/27/2014) 20 tablet 0  . Dutasteride-Tamsulosin HCl (JALYN) 0.5-0.4 MG CAPS Take 1 capsule by mouth daily.     . finasteride (PROSCAR) 5 MG tablet Take 5 mg by mouth daily.    Marland Kitchen HYDROcodone-acetaminophen (NORCO) 10-325 MG per tablet Take 1 tablet up to five times daily (Patient taking differently: Take 1 tablet by mouth 4 (four) times daily. ) 150 tablet 0  . ibuprofen (ADVIL,MOTRIN) 800 MG tablet TAKE 1 TABLET BY MOUTH THREE TIMES DAILY (Patient taking differently: Take 800mg  by mouth 3 times daily) 270 tablet 0  . leflunomide (ARAVA) 20 MG tablet Take 20 mg by mouth daily.    Marland Kitchen levothyroxine (SYNTHROID, LEVOTHROID) 100 MCG tablet TAKE 1 TABLET BY MOUTH EVERY DAY (Patient taking differently:  Take by mouth every day) 90 tablet 1  . lisinopril (PRINIVIL,ZESTRIL) 20 MG tablet TAKE ONE TABLET BY MOUTH DAILY. 30 tablet 5  . Melatonin 3 MG TABS Take 3-6 mg by mouth at bedtime as needed.     . Multiple Vitamin (MULTIVITAMIN) capsule Take 1 capsule by mouth daily.    Marland Kitchen omeprazole (PRILOSEC OTC) 20 MG tablet Take 20 mg by mouth 2 (two) times daily.    Marland Kitchen OVER THE COUNTER MEDICATION Take 1 capsule by mouth daily. "Florajen 3 Probiotic"    . Potassium Gluconate 595 MG CAPS Take 595 mg by mouth daily.     . predniSONE (DELTASONE) 10 MG tablet TAKE ONE TABLET BY MOUTH ONCE DAILY. (Patient taking differently: Take 10mg  by mouth once daily) 30 tablet 6  . tamsulosin (FLOMAX) 0.4 MG CAPS capsule Take 0.4 mg by mouth daily.    . Vitamin D, Ergocalciferol, (DRISDOL) 50000 UNITS CAPS capsule Take 50,000 Units by mouth every 7 (seven) days. On Monday.     No current facility-administered medications for this visit.     Past Surgical History  Procedure Laterality Date  . Coronary artery bypass graft      x6 In 1998  . Total knee arthroplasty      Right  . Ankle fusion      Left  . Wrist fusion      Right  . Shoulder surgery    . Colonoscopy  09/12/2011    Dr. Elly Modena hemorrhoids, normal colon and distal terminal ileum. Random bx negative. Stool for CDiff positive.  . Esophagogastroduodenoscopy  09/13/2011    Dr. Lyndle Herrlich plawues mid-esophagus KOH negative. Distal esophageal ring and ulcer. Mild gastritis. duodenal diverticulum. Savaory dilation 66mm.   Gaspar Bidding dilation  09/13/2011  . Esophageal manometry  09/30/2011    Procedure: ESOPHAGEAL MANOMETRY (EM);  Surgeon: Rob Bunting, MD;  Location: [facility];  Service: Endoscopy;  Laterality: N/A;  . Esophagogastroduodenoscopy  09/12/2011    Dr. Rinaldo Ratel rings s/p dilation  . Cardiac surgery    . Esophagomyotomy  11/12/11    [facility]- with DOR antireflux surgery  . Esophagogastroduodenoscopy  06/25/2012     Procedure: ESOPHAGOGASTRODUODENOSCOPY (EGD);  Surgeon: Corbin Ade, MD;  Location: [facility];  Service: Endoscopy;  Laterality: N/A;  7:30  . Savory dilation  06/25/2012    Procedure: SAVORY DILATION;  Surgeon: Corbin Ade, MD;  Location: [facility];  Service: Endoscopy;  Laterality: N/A;  Elease Hashimoto dilation  06/25/2012    Procedure: Elease Hashimoto DILATION;  Surgeon: Corbin Ade, MD;  Location: [facility];  Service: Endoscopy;  Laterality: N/A;  . Ankle fusion  09/01/2012    Procedure: ANKLE FUSION;  Surgeon: Valeria Batman, MD;  Location: [facility] OR;  Service: Orthopedics;  Laterality: Left;  Take down of angulated tibial/fibula Fractures  . Femoral-tibial bypass graft Left 01/28/2013    Procedure: BYPASS GRAFT FEMORAL-TIBIAL ARTERY;  Surgeon: Nada Libman, MD;  Location: [facility] OR;  Service: Vascular;  Laterality: Left;  Ultrasound Guided  . Amputation Left 01/28/2013    Procedure: AMPUTATION LEFT 5TH TOE;  Surgeon: Nada Libman, MD;  Location: [facility] OR;  Service: Vascular;  Laterality: Left;  . Joint replacement Right 1999    Knee  . Spine surgery  11-03-13  . Abdominal aortagram N/A 01/01/2013    Procedure: ABDOMINAL Ronny Flurry;  Surgeon: Sherren Kerns, MD;  Location: [facility] CATH LAB;  Service: Cardiovascular;  Laterality: N/A;  . Abdominal aortagram N/A 10/05/2013    Procedure: ABDOMINAL AORTAGRAM;  Surgeon: Nada Libman, MD;  Location: [facility] CATH LAB;  Service: Cardiovascular;  Laterality: N/A;  . Lower extremity angiogram Left 10/05/2013    Procedure: LOWER EXTREMITY ANGIOGRAM;  Surgeon: Nada Libman, MD;  Location: [facility] CATH LAB;  Service: Cardiovascular;  Laterality: Left;  . Abdominal aortagram N/A 11/01/2014    Procedure: ABDOMINAL Ronny Flurry;  Surgeon: Nada Libman, MD;  Location: [facility] CATH LAB;  Service: Cardiovascular;  Laterality: N/A;     Allergies  Allergen Reactions  . Clindamycin/Lincomycin Other (See Comments)    Kidney Failure  . Lisinopril Other (See Comments)     Kidney failure   . Amoxicillin Hives and Other (See Comments)    Hallucinations   . Ciprofloxacin Other (See Comments)    C-diff  . Other Hives, Diarrhea and Other (See Comments)    "Mycin" Causes C-diff  . Doxycycline Nausea And Vomiting  . Levofloxacin Nausea And Vomiting      Family History  Problem Relation Age of Onset  . Stomach cancer Mother 78  . Cancer Mother     Stomach  . Heart disease Mother   . Heart attack Mother   . Heart attack Father   . Heart disease Father   . Heart disease Brother   . Leukemia Brother   . Lung disease Son     infant  . Lung disease Daughter     infant     Social History Mr. Riggan reports that he quit smoking about 49 years ago. His smoking use included Cigarettes. He has a 2.5 pack-year smoking history. He has quit using smokeless tobacco. Mr. Rossmiller reports that he does not drink alcohol.   Review of Systems CONSTITUTIONAL: No weight loss, fever, chills, weakness or fatigue.  HEENT: Eyes: No visual loss, blurred vision, double vision or yellow sclerae.No hearing loss, sneezing, congestion, runny nose or sore throat.  SKIN: No rash or itching.  CARDIOVASCULAR: per HPI RESPIRATORY: No shortness of breath, cough or sputum.  GASTROINTESTINAL: No anorexia, nausea, vomiting or diarrhea. No abdominal pain or blood.  GENITOURINARY: No burning on urination, no polyuria NEUROLOGICAL: No headache, dizziness, syncope, paralysis, ataxia, numbness or tingling in the extremities. No change in bowel or bladder control.  MUSCULOSKELETAL: No muscle, back pain, joint pain or stiffness.  LYMPHATICS: No enlarged nodes. No history of splenectomy.  PSYCHIATRIC: No history of depression or anxiety.  ENDOCRINOLOGIC: No reports of sweating, cold or heat intolerance. No polyuria or polydipsia.  Marland Kitchen   Physical Examination p 62 bp 142/82 Wt 145 lbs BMI 22 Gen: resting comfortably, no acute distress  HEENT: no scleral icterus, pupils equal round and  reactive, no palptable cervical adenopathy,  CV: RRR, no m/r/g/  No JVD. Resp: Clear to auscultation bilaterally GI: abdomen is soft, non-tender, non-distended, normal bowel sounds, no hepatosplenomegaly MSK: extremities are warm, no edema.  Skin: warm, no rash Neuro:  no focal deficits Psych: appropriate affect   Diagnostic Studies  10/2007 Cath  HEMODYNAMIC RESULTS: Left ventricle 139/16 mmHg. Aorta 136/67 mmHg.  ANGIOGRAPHIC FINDINGS:  1. Left main coronary artery is severely diseased to approximately 95%  in diffuse fashion. This has progressed compared to the previous  angiogram. This vessel gives rise to the left anterior descending,  a very small ramus intermedius, and circumflex vessels.  2. The left anterior descending is occluded proximally.  3. The circumflex coronary artery is occluded proximally.  4. The right coronary artery is occluded proximally just after the  takeoff of a right ventricular marginal Dontez Hauss. There are some  right to right bridging collaterals noted filling the right  coronary artery nearly down to the distal portion.  5. The saphenous vein graft to the first and second obtuse marginals  is widely patent. There is an area of 30-40% stenosis within the  proximal vessel although not clearly flow-limiting. The  anastomotic sites look to be intact.  6. There is known occlusion of the saphenous vein graft of the right  coronary system.  7. The LIMA to the left anterior descending and diagonal is widely  patent. Anastomotic sites are intact. Of note, the distal left  anterior descending is diffusely diseased, and there is an apical  90% stenosis noted. The diagonal Johndavid Geralds is also diffusely diseased  and relatively small.  8. There is some left-to-right collateralization of the distal right  coronary artery system, although not well-developed.  Ventriculography is performed in the RAO projection and reveals an  ejection  fraction of approximately 60% with no significant wall motion  abnormality and no mitral regurgitation.  DIAGNOSES:  1. Severe native coronary artery disease as outlined. There has been  progression in left main disease, although continued occlusion  proximally of the left anterior descending and circumflex vessels.  The distal left anterior descending is diffusely diseased, and  there is some left-to-right collateralization of the occluded right  coronary artery although not to a large degree.  2. Patent left internal mammary artery to left anterior descending and  diagonal, patent saphenous vein graft to the first and second  obtuse marginal with 30-40% proximal vein graft stenosis (not  clearly flow-limiting), and known occlusion of the saphenous vein  graft to posterior descending and right coronary artery.  3. Left ventricular ejection fraction of approximately 60% with no  large focal wall motion abnormality and a left ventricular end-  diastolic pressure of 16 mmHg. No significant mitral regurgitation  is noted.  DISCUSSION: No obvious revascularization options noted at this time.  Suspect that angina may well be related to progression in the patient's  native disease rather than new hemodynamically significant occlusions  within his remaining 2 bypass grafts which are widely patent. There is  30-40% stenosis within the saphenous vein graft of the obtuse marginal  system, although this does not appear to be flow-limiting. At this  point, medical therapy seems to be the best option. I discussed this  with the patient, his family, and with Dr. [name].   Echo 09/2010: LVEF 60-65%, grade II diastolic dysfunction   04/2013 ABI: right 1.39 left 1.46  Jan 2015 LE Angiogram  Findings:  Aortogram: No significant suprarenal aortic stenosis is identified. There is no evidence of renal artery stenosis. The infrarenal abdominal aorta is widely patent. The stent  within the left common iliac artery is widely patent. The left external iliac artery is widely patent. The right iliac system is widely patent.  Left Lower Extremity: The left common femoral and profunda femoral arteries are widely patent. The left superficial femoral artery is patent down to the adductor canal where it occludes. There is a bypass graft originating from the distal superficial femoral artery. The proximal anastomosis is widely patent. The vein bypass graft is widely patent. The distal anastomosis shows mild narrowing and just beyond the anastomosis is a high-grade stenosis of approximately 80%. The posterior tibial artery isn't patent down across the ankle.  Intervention: After the above images were acquired, the decision was made to proceed with intervention. Over an 035 wire, a 6 French sheath was advanced into the left superficial femoral artery. The patient was fully heparinized. A [place] core wire was easily advanced across the distal anastomosis. I selected a 3 x 20 Fox SV balloon and perform primary balloon angioplasty of the distal anastomosis and posterior tibial artery. The balloon was taken 14 atmospheres for 2 minutes. Completion angiography revealed resolution of the stenosis. At this point catheters and wires were removed. The sheath was withdrawn to the right external iliac artery. The patient was taken to the holding area for sheath pull once his coagulation profile corrects.  Impression:  #1 high-grade stenosis at the distal anastomosis and in the native posterior tibial artery just beyond the distal anastomosis. This was successfully dilated using a 3 x 20 balloon with excellent results.  12/01/13 Clinic EKG NSR, LVH, LAE    Assessment and Plan  1. CAD  - no current symptoms  - continue risk factor modification, continue current meds   2. PAD  - continue follow up with vascular  3. Hyperlipidemia  - continue high dose statin  4. HTN -at goal, continue  current meds - keep bp log until next visit  F/u 6 months    Antoine Poche, M.D.

## 2014-12-02 NOTE — Patient Instructions (Signed)
Your physician wants you to follow-up in: 6 months with Dr. Wyline Mood. You will receive a reminder letter in the mail two months in advance. If you don't receive a letter, please call our office to schedule the follow-up appointment.  Your physician recommends that you continue on your current medications as directed. Please refer to the Current Medication list given to you today.  Your physician has requested that you regularly monitor and record your blood pressure readings at home. Please use the same machine at the same time of day to check your readings and record them to bring to your follow-up visit.  Thank you for choosing [facility]!

## 2014-12-14 NOTE — Patient Instructions (Signed)
Your procedure is scheduled on: 12/22/2014  Report to [facility] at  1220  PM.  Call this number if you have problems the morning of surgery: [phone]   Do not eat food or drink liquids :After Midnight.      Take these medicines the morning of surgery with A SIP OF WATER: xanax, atenolol, proscar, hydrocodone, flomax, prednisone, prilosec, lisinopril, levothyroxine, arava. Take your inhaler before you come.   Do not wear jewelry, make-up or nail polish.  Do not wear lotions, powders, or perfumes.   Do not shave 48 hours prior to surgery.  Do not bring valuables to the hospital.  Contacts, dentures or bridgework may not be worn into surgery.  Leave suitcase in the car. After surgery it may be brought to your room.  For patients admitted to the hospital, checkout time is 11:00 AM the day of discharge.   Patients discharged the day of surgery will not be allowed to drive home.  :     Please read over the following fact sheets that you were given: Coughing and Deep Breathing, Surgical Site Infection Prevention, Anesthesia Post-op Instructions and Care and Recovery After Surgery    Cataract A cataract is a clouding of the lens of the eye. When a lens becomes cloudy, vision is reduced based on the degree and nature of the clouding. Many cataracts reduce vision to some degree. Some cataracts make people more near-sighted as they develop. Other cataracts increase glare. Cataracts that are ignored and become worse can sometimes look white. The white color can be seen through the pupil. CAUSES   Aging. However, cataracts may occur at any age, even in newborns.   Certain drugs.   Trauma to the eye.   Certain diseases such as diabetes.   Specific eye diseases such as chronic inflammation inside the eye or a sudden attack of a rare form of glaucoma.   Inherited or acquired medical problems.  SYMPTOMS   Gradual, progressive drop in vision in the affected eye.   Severe, rapid visual loss.  This most often happens when trauma is the cause.  DIAGNOSIS  To detect a cataract, an eye doctor examines the lens. Cataracts are best diagnosed with an exam of the eyes with the pupils enlarged (dilated) by drops.  TREATMENT  For an early cataract, vision may improve by using different eyeglasses or stronger lighting. If that does not help your vision, surgery is the only effective treatment. A cataract needs to be surgically removed when vision loss interferes with your everyday activities, such as driving, reading, or watching TV. A cataract may also have to be removed if it prevents examination or treatment of another eye problem. Surgery removes the cloudy lens and usually replaces it with a substitute lens (intraocular lens, IOL).  At a time when both you and your doctor agree, the cataract will be surgically removed. If you have cataracts in both eyes, only one is usually removed at a time. This allows the operated eye to heal and be out of danger from any possible problems after surgery (such as infection or poor wound healing). In rare cases, a cataract may be doing damage to your eye. In these cases, your caregiver may advise surgical removal right away. The vast majority of people who have cataract surgery have better vision afterward. HOME CARE INSTRUCTIONS  If you are not planning surgery, you may be asked to do the following:  Use different eyeglasses.   Use stronger or brighter lighting.  Ask your eye doctor about reducing your medicine dose or changing medicines if it is thought that a medicine caused your cataract. Changing medicines does not make the cataract go away on its own.   Become familiar with your surroundings. Poor vision can lead to injury. Avoid bumping into things on the affected side. You are at a higher risk for tripping or falling.   Exercise extreme care when driving or operating machinery.   Wear sunglasses if you are sensitive to bright light or experiencing  problems with glare.  SEEK IMMEDIATE MEDICAL CARE IF:   You have a worsening or sudden vision loss.   You notice redness, swelling, or increasing pain in the eye.   You have a fever.  Document Released: 09/09/2005 Document Revised: 08/29/2011 Document Reviewed: 05/03/2011 [facility] Patient Information 2012 [place].PATIENT INSTRUCTIONS POST-ANESTHESIA  IMMEDIATELY FOLLOWING SURGERY:  Do not drive or operate machinery for the first twenty four hours after surgery.  Do not make any important decisions for twenty four hours after surgery or while taking narcotic pain medications or sedatives.  If you develop intractable nausea and vomiting or a severe headache please notify your doctor immediately.  FOLLOW-UP:  Please make an appointment with your surgeon as instructed. You do not need to follow up with anesthesia unless specifically instructed to do so.  WOUND CARE INSTRUCTIONS (if applicable):  Keep a dry clean dressing on the anesthesia/puncture wound site if there is drainage.  Once the wound has quit draining you may leave it open to air.  Generally you should leave the bandage intact for twenty four hours unless there is drainage.  If the epidural site drains for more than 36-48 hours please call the anesthesia department.  QUESTIONS?:  Please feel free to call your physician or the hospital operator if you have any questions, and they will be happy to assist you.

## 2014-12-19 ENCOUNTER — Other Ambulatory Visit: Payer: Self-pay

## 2014-12-19 ENCOUNTER — Encounter (HOSPITAL_COMMUNITY): Payer: Self-pay

## 2014-12-19 ENCOUNTER — Encounter (HOSPITAL_COMMUNITY)
Admission: RE | Admit: 2014-12-19 | Discharge: 2014-12-19 | Disposition: A | Discharge status: Home / Self Care | Payer: Medicare Other | Source: Ambulatory Visit | Attending: Ophthalmology | Admitting: Ophthalmology

## 2014-12-19 DIAGNOSIS — K220 Achalasia of cardia: Secondary | ICD-10-CM | POA: Diagnosis not present

## 2014-12-19 DIAGNOSIS — Z87891 Personal history of nicotine dependence: Secondary | ICD-10-CM | POA: Diagnosis not present

## 2014-12-19 DIAGNOSIS — I2510 Atherosclerotic heart disease of native coronary artery without angina pectoris: Secondary | ICD-10-CM | POA: Diagnosis not present

## 2014-12-19 DIAGNOSIS — H25812 Combined forms of age-related cataract, left eye: Secondary | ICD-10-CM | POA: Diagnosis present

## 2014-12-19 DIAGNOSIS — K219 Gastro-esophageal reflux disease without esophagitis: Secondary | ICD-10-CM | POA: Diagnosis not present

## 2014-12-19 DIAGNOSIS — I4891 Unspecified atrial fibrillation: Secondary | ICD-10-CM | POA: Diagnosis not present

## 2014-12-19 DIAGNOSIS — E119 Type 2 diabetes mellitus without complications: Secondary | ICD-10-CM | POA: Diagnosis not present

## 2014-12-19 DIAGNOSIS — I10 Essential (primary) hypertension: Secondary | ICD-10-CM | POA: Diagnosis not present

## 2014-12-19 DIAGNOSIS — R0602 Shortness of breath: Secondary | ICD-10-CM | POA: Diagnosis not present

## 2014-12-19 LAB — CBC
HCT: 35.7 % — ABNORMAL LOW (ref 39.0–52.0)
Hemoglobin: 11.3 g/dL — ABNORMAL LOW (ref 13.0–17.0)
MCH: 29.4 pg (ref 26.0–34.0)
MCHC: 31.7 g/dL (ref 30.0–36.0)
MCV: 93 fL (ref 78.0–100.0)
PLATELETS: 184 10*3/uL (ref 150–400)
RBC: 3.84 MIL/uL — AB (ref 4.22–5.81)
RDW: 15.7 % — AB (ref 11.5–15.5)
WBC: 10 10*3/uL (ref 4.0–10.5)

## 2014-12-19 LAB — BASIC METABOLIC PANEL
Anion gap: 9 (ref 5–15)
BUN: 21 mg/dL (ref 6–23)
CO2: 20 mmol/L (ref 19–32)
CREATININE: 1.05 mg/dL (ref 0.50–1.35)
Calcium: 8.7 mg/dL (ref 8.4–10.5)
Chloride: 115 mmol/L — ABNORMAL HIGH (ref 96–112)
GFR calc Af Amer: 82 mL/min — ABNORMAL LOW (ref >90)
GFR calc non Af Amer: 70 mL/min — ABNORMAL LOW (ref >90)
Glucose, Bld: 144 mg/dL — ABNORMAL HIGH (ref 70–99)
Potassium: 3.8 mmol/L (ref 3.5–5.1)
Sodium: 144 mmol/L (ref 135–145)

## 2014-12-20 ENCOUNTER — Other Ambulatory Visit: Payer: Self-pay | Admitting: Family Medicine

## 2014-12-21 MED ORDER — CYCLOPENTOLATE-PHENYLEPHRINE OP SOLN OPTIME - NO CHARGE
OPHTHALMIC | Status: AC
  Filled 2014-12-21: qty 2

## 2014-12-21 MED ORDER — PHENYLEPHRINE HCL 2.5 % OP SOLN
OPHTHALMIC | Status: AC
  Filled 2014-12-21: qty 15

## 2014-12-21 MED ORDER — LIDOCAINE HCL 3.5 % OP GEL
OPHTHALMIC | Status: AC
  Filled 2014-12-21: qty 1

## 2014-12-21 MED ORDER — NEOMYCIN-POLYMYXIN-DEXAMETH 3.5-10000-0.1 OP SUSP
OPHTHALMIC | Status: AC
  Filled 2014-12-21: qty 5

## 2014-12-21 MED ORDER — TETRACAINE HCL 0.5 % OP SOLN
OPHTHALMIC | Status: AC
  Filled 2014-12-21: qty 2

## 2014-12-21 MED ORDER — LIDOCAINE HCL (PF) 1 % IJ SOLN
INTRAMUSCULAR | Status: AC
  Filled 2014-12-21: qty 2

## 2014-12-22 ENCOUNTER — Ambulatory Visit (HOSPITAL_COMMUNITY): Payer: Medicare Other | Admitting: Anesthesiology

## 2014-12-22 ENCOUNTER — Ambulatory Visit (HOSPITAL_COMMUNITY)
Admission: RE | Admit: 2014-12-22 | Discharge: 2014-12-22 | Disposition: A | Discharge status: Home / Self Care | Payer: Medicare Other | Source: Ambulatory Visit | Attending: Ophthalmology | Admitting: Ophthalmology

## 2014-12-22 ENCOUNTER — Encounter (HOSPITAL_COMMUNITY)
Admission: RE | Disposition: A | Discharge status: Home / Self Care | Payer: Self-pay | Source: Ambulatory Visit | Attending: Ophthalmology

## 2014-12-22 ENCOUNTER — Encounter (HOSPITAL_COMMUNITY): Payer: Self-pay | Admitting: *Deleted

## 2014-12-22 DIAGNOSIS — Z87891 Personal history of nicotine dependence: Secondary | ICD-10-CM | POA: Insufficient documentation

## 2014-12-22 DIAGNOSIS — E119 Type 2 diabetes mellitus without complications: Secondary | ICD-10-CM | POA: Insufficient documentation

## 2014-12-22 DIAGNOSIS — K220 Achalasia of cardia: Secondary | ICD-10-CM | POA: Insufficient documentation

## 2014-12-22 DIAGNOSIS — H25812 Combined forms of age-related cataract, left eye: Secondary | ICD-10-CM | POA: Diagnosis not present

## 2014-12-22 DIAGNOSIS — I2510 Atherosclerotic heart disease of native coronary artery without angina pectoris: Secondary | ICD-10-CM | POA: Diagnosis not present

## 2014-12-22 DIAGNOSIS — I10 Essential (primary) hypertension: Secondary | ICD-10-CM | POA: Insufficient documentation

## 2014-12-22 DIAGNOSIS — K219 Gastro-esophageal reflux disease without esophagitis: Secondary | ICD-10-CM | POA: Insufficient documentation

## 2014-12-22 DIAGNOSIS — R0602 Shortness of breath: Secondary | ICD-10-CM | POA: Insufficient documentation

## 2014-12-22 DIAGNOSIS — I4891 Unspecified atrial fibrillation: Secondary | ICD-10-CM | POA: Insufficient documentation

## 2014-12-22 HISTORY — PX: EYE SURGERY: SHX253

## 2014-12-22 HISTORY — PX: CATARACT EXTRACTION W/PHACO: SHX586

## 2014-12-22 LAB — GLUCOSE, CAPILLARY: GLUCOSE-CAPILLARY: 108 mg/dL — AB (ref 70–99)

## 2014-12-22 LAB — HM DIABETES EYE EXAM

## 2014-12-22 SURGERY — PHACOEMULSIFICATION, CATARACT, WITH IOL INSERTION
Anesthesia: Monitor Anesthesia Care | Site: Eye | Laterality: Left

## 2014-12-22 MED ORDER — BSS IO SOLN
INTRAOCULAR | Status: DC | PRN
  Administered 2014-12-22: 15 mL via INTRAOCULAR

## 2014-12-22 MED ORDER — LIDOCAINE HCL (PF) 1 % IJ SOLN
INTRAMUSCULAR | Status: DC | PRN
  Administered 2014-12-22: .6 mL

## 2014-12-22 MED ORDER — POVIDONE-IODINE 5 % OP SOLN
OPHTHALMIC | Status: DC | PRN
  Administered 2014-12-22: 1 via OPHTHALMIC

## 2014-12-22 MED ORDER — FENTANYL CITRATE 0.05 MG/ML IJ SOLN
25.0000 ug | INTRAMUSCULAR | Status: AC
  Administered 2014-12-22: 25 ug via INTRAVENOUS

## 2014-12-22 MED ORDER — CYCLOPENTOLATE-PHENYLEPHRINE 0.2-1 % OP SOLN
1.0000 [drp] | OPHTHALMIC | Status: AC
Start: 2014-12-22 — End: 2014-12-22
  Administered 2014-12-22 (×3): 1 [drp] via OPHTHALMIC

## 2014-12-22 MED ORDER — NEOMYCIN-POLYMYXIN-DEXAMETH 3.5-10000-0.1 OP SUSP
OPHTHALMIC | Status: DC | PRN
  Administered 2014-12-22: 2 [drp] via OPHTHALMIC

## 2014-12-22 MED ORDER — PROVISC 10 MG/ML IO SOLN
INTRAOCULAR | Status: DC | PRN
  Administered 2014-12-22: 0.85 mL via INTRAOCULAR

## 2014-12-22 MED ORDER — LIDOCAINE HCL 3.5 % OP GEL
1.0000 "application " | Freq: Once | OPHTHALMIC | Status: AC
  Administered 2014-12-22: 1 via OPHTHALMIC

## 2014-12-22 MED ORDER — LACTATED RINGERS IV SOLN
INTRAVENOUS | Status: DC
  Administered 2014-12-22: 13:00:00 via INTRAVENOUS

## 2014-12-22 MED ORDER — PHENYLEPHRINE HCL 2.5 % OP SOLN
1.0000 [drp] | OPHTHALMIC | Status: AC
  Administered 2014-12-22 (×3): 1 [drp] via OPHTHALMIC

## 2014-12-22 MED ORDER — MIDAZOLAM HCL 5 MG/5ML IJ SOLN
INTRAMUSCULAR | Status: AC
  Administered 2014-12-22: 2 mg
  Filled 2014-12-22: qty 5

## 2014-12-22 MED ORDER — MIDAZOLAM HCL 2 MG/2ML IJ SOLN
1.0000 mg | INTRAMUSCULAR | Status: DC | PRN
  Administered 2014-12-22: 2 mg via INTRAVENOUS

## 2014-12-22 MED ORDER — FENTANYL CITRATE 0.05 MG/ML IJ SOLN
INTRAMUSCULAR | Status: AC
  Filled 2014-12-22: qty 2

## 2014-12-22 MED ORDER — TETRACAINE HCL 0.5 % OP SOLN
1.0000 [drp] | OPHTHALMIC | Status: AC
  Administered 2014-12-22 (×3): 1 [drp] via OPHTHALMIC

## 2014-12-22 MED ORDER — EPINEPHRINE HCL 1 MG/ML IJ SOLN
INTRAOCULAR | Status: DC | PRN
  Administered 2014-12-22: 13:00:00

## 2014-12-22 SURGICAL SUPPLY — 34 items
CAPSULAR TENSION RING-AMO (OPHTHALMIC RELATED) IMPLANT
CLOTH BEACON ORANGE TIMEOUT ST (SAFETY) ×2 IMPLANT
EYE SHIELD UNIVERSAL CLEAR (GAUZE/BANDAGES/DRESSINGS) ×2 IMPLANT
GLOVE BIO SURGEON STRL SZ 6.5 (GLOVE) IMPLANT
GLOVE BIO SURGEONS STRL SZ 6.5 (GLOVE)
GLOVE BIOGEL PI IND STRL 6.5 (GLOVE) IMPLANT
GLOVE BIOGEL PI IND STRL 7.0 (GLOVE) IMPLANT
GLOVE BIOGEL PI IND STRL 7.5 (GLOVE) IMPLANT
GLOVE BIOGEL PI INDICATOR 6.5 (GLOVE) ×2
GLOVE BIOGEL PI INDICATOR 7.0 (GLOVE)
GLOVE BIOGEL PI INDICATOR 7.5 (GLOVE)
GLOVE ECLIPSE 6.5 STRL STRAW (GLOVE) IMPLANT
GLOVE ECLIPSE 7.0 STRL STRAW (GLOVE) IMPLANT
GLOVE ECLIPSE 7.5 STRL STRAW (GLOVE) IMPLANT
GLOVE EXAM NITRILE LRG STRL (GLOVE) ×2 IMPLANT
GLOVE EXAM NITRILE MD LF STRL (GLOVE) IMPLANT
GLOVE SKINSENSE NS SZ6.5 (GLOVE)
GLOVE SKINSENSE NS SZ7.0 (GLOVE)
GLOVE SKINSENSE STRL SZ6.5 (GLOVE) IMPLANT
GLOVE SKINSENSE STRL SZ7.0 (GLOVE) IMPLANT
KIT VITRECTOMY (OPHTHALMIC RELATED) IMPLANT
PAD ARMBOARD 7.5X6 YLW CONV (MISCELLANEOUS) ×2 IMPLANT
PROC W NO LENS (INTRAOCULAR LENS)
PROC W SPEC LENS (INTRAOCULAR LENS)
PROCESS W NO LENS (INTRAOCULAR LENS) IMPLANT
PROCESS W SPEC LENS (INTRAOCULAR LENS) IMPLANT
RETRACTOR IRIS SIGHTPATH (OPHTHALMIC RELATED) IMPLANT
RING MALYGIN (MISCELLANEOUS) IMPLANT
SIGHTPATH CAT PROC W REG LENS (Ophthalmic Related) ×3 IMPLANT
SYRINGE LUER LOK 1CC (MISCELLANEOUS) ×2 IMPLANT
TAPE SURG TRANSPARENT 2IN (GAUZE/BANDAGES/DRESSINGS) IMPLANT
TAPE TRANSPARENT 2IN (GAUZE/BANDAGES/DRESSINGS) ×2
VISCOELASTIC ADDITIONAL (OPHTHALMIC RELATED) IMPLANT
WATER STERILE IRR 250ML POUR (IV SOLUTION) ×2 IMPLANT

## 2014-12-22 NOTE — Op Note (Signed)
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  Assistants: None  Anesthesia: Topical with MAC  Indications: Painless, progressive loss of vision with compromise of daily activities.  Surgery: Cataract Extraction with Intraocular lens Implant Left Eye  Discription: The patient had dilating drops and viscous lidocaine placed into the Left eye in the pre-op holding area. After transfer to the operating room, a time out was performed. The patient was then prepped and draped. Beginning with a 75 degree blade a paracentesis port was made at the surgeon's 2 o'clock position. The anterior chamber was then filled with 1% non-preserved lidocaine. This was followed by filling the anterior chamber with Provisc.  A 2.30mm keratome blade was used to make a clear corneal incision at the temporal limbus.  A bent cystatome needle was used to create a continuous tear capsulotomy. Hydrodissection was performed with balanced salt solution on a Fine canula. The lens nucleus was then removed using the phacoemulsification handpiece. Residual cortex was removed with the I&A handpiece. The anterior chamber and capsular bag were refilled with Provisc. A posterior chamber intraocular lens was placed into the capsular bag with it's injector. The implant was positioned with the Kuglan hook. The Provisc was then removed from the anterior chamber and capsular bag with the I&A handpiece. Stromal hydration of the main incision and paracentesis port was performed with BSS on a Fine canula. The wounds were tested for leak which was negative. The patient tolerated the procedure well. There were no operative complications. The patient was then transferred to the recovery room in stable condition.  Complications: None  Specimen: None  EBL: None  Prosthetic device: Hoya iSert 250, power 22.5 D, SN  [id number].

## 2014-12-22 NOTE — Transfer of Care (Signed)
Immediate Anesthesia Transfer of Care Note  Patient: [name]  Procedure(s) Performed: Procedure(s) with comments: CATARACT EXTRACTION PHACO AND INTRAOCULAR LENS PLACEMENT (IOC) (Left) - CDE:11.89  Patient Location: Short Stay  Anesthesia Type:MAC  Level of Consciousness: awake, alert , oriented and patient cooperative  Airway & Oxygen Therapy: Patient Spontanous Breathing  Post-op Assessment: Report given to RN and Post -op Vital signs reviewed and stable  Post vital signs: Reviewed and stable  Last Vitals: There were no vitals filed for this visit.  Complications: No apparent anesthesia complications

## 2014-12-22 NOTE — Anesthesia Postprocedure Evaluation (Signed)
  Anesthesia Post-op Note  Patient: [name]  Procedure(s) Performed: Procedure(s) with comments: CATARACT EXTRACTION PHACO AND INTRAOCULAR LENS PLACEMENT (IOC) (Left) - CDE:11.89  Patient Location: Short Stay  Anesthesia Type:MAC  Level of Consciousness: awake, alert , oriented and patient cooperative  Airway and Oxygen Therapy: Patient Spontanous Breathing  Post-op Pain: none  Post-op Assessment: Post-op Vital signs reviewed, Patient's Cardiovascular Status Stable, Respiratory Function Stable, Patent Airway and No signs of Nausea or vomiting  Post-op Vital Signs: Reviewed and stable  Last Vitals: There were no vitals filed for this visit.  Complications: No apparent anesthesia complications

## 2014-12-22 NOTE — Anesthesia Procedure Notes (Signed)
Procedure Name: MAC Date/Time: 12/22/2014 12:56 PM Performed by: Pernell Dupre, Caprisha Bridgett A Pre-anesthesia Checklist: Patient identified, Timeout performed, Emergency Drugs available, Suction available and Patient being monitored

## 2014-12-22 NOTE — H&P (Signed)
I have reviewed the H&P, the patient was re-examined, and I have identified no interval changes in medical condition and plan of care since the history and physical of record  

## 2014-12-22 NOTE — Anesthesia Preprocedure Evaluation (Signed)
Anesthesia Evaluation  Patient identified by MRN, date of birth, ID band Patient awake    Reviewed: Allergy & Precautions, H&P , NPO status , Patient's Chart, lab work & pertinent test results, reviewed documented beta blocker date and time   History of Anesthesia Complications Negative for: history of anesthetic complications  Airway Mallampati: I   Neck ROM: Full    Dental  (+) Partial Lower   Pulmonary shortness of breath and with exertion, former smoker,  breath sounds clear to auscultation        Cardiovascular hypertension, Pt. on medications and Pt. on home beta blockers + angina + CAD and + Peripheral Vascular Disease + dysrhythmias Atrial Fibrillation Rhythm:Regular Rate:Normal     Neuro/Psych  Neuromuscular disease    GI/Hepatic GERD-  Medicated,achalasia   Endo/Other  diabetes, Type 2, Oral Hypoglycemic AgentsHypothyroidism   Renal/GU      Musculoskeletal  (+) Arthritis -, Rheumatoid disorders,    Abdominal   Peds  Hematology   Anesthesia Other Findings   Reproductive/Obstetrics                             Anesthesia Physical Anesthesia Plan  ASA: III  Anesthesia Plan: MAC   Post-op Pain Management:    Induction: Intravenous  Airway Management Planned: Nasal ETT  Additional Equipment:   Intra-op Plan:   Post-operative Plan:   Informed Consent: I have reviewed the patients History and Physical, chart, labs and discussed the procedure including the risks, benefits and alternatives for the proposed anesthesia with the patient or authorized representative who has indicated his/her understanding and acceptance.     Plan Discussed with:   Anesthesia Plan Comments:         Anesthesia Quick Evaluation  

## 2014-12-23 ENCOUNTER — Encounter (HOSPITAL_COMMUNITY): Payer: Self-pay | Admitting: Ophthalmology

## 2014-12-31 HISTORY — PX: EYE SURGERY: SHX253

## 2015-01-02 ENCOUNTER — Other Ambulatory Visit: Payer: Self-pay | Admitting: Family Medicine

## 2015-01-03 ENCOUNTER — Ambulatory Visit (INDEPENDENT_AMBULATORY_CARE_PROVIDER_SITE_OTHER): Payer: Medicare Other | Admitting: Family Medicine

## 2015-01-03 ENCOUNTER — Encounter: Payer: Self-pay | Admitting: Family Medicine

## 2015-01-03 VITALS — BP 136/80 | Ht 63.0 in | Wt 146.0 lb

## 2015-01-03 DIAGNOSIS — J984 Other disorders of lung: Secondary | ICD-10-CM

## 2015-01-03 DIAGNOSIS — E039 Hypothyroidism, unspecified: Secondary | ICD-10-CM

## 2015-01-03 DIAGNOSIS — Z79899 Other long term (current) drug therapy: Secondary | ICD-10-CM

## 2015-01-03 DIAGNOSIS — N400 Enlarged prostate without lower urinary tract symptoms: Secondary | ICD-10-CM

## 2015-01-03 DIAGNOSIS — G894 Chronic pain syndrome: Secondary | ICD-10-CM

## 2015-01-03 DIAGNOSIS — I10 Essential (primary) hypertension: Secondary | ICD-10-CM | POA: Diagnosis not present

## 2015-01-03 DIAGNOSIS — I6523 Occlusion and stenosis of bilateral carotid arteries: Secondary | ICD-10-CM

## 2015-01-03 DIAGNOSIS — E119 Type 2 diabetes mellitus without complications: Secondary | ICD-10-CM

## 2015-01-03 DIAGNOSIS — E785 Hyperlipidemia, unspecified: Secondary | ICD-10-CM

## 2015-01-03 LAB — POCT GLYCOSYLATED HEMOGLOBIN (HGB A1C): Hemoglobin A1C: 6.7

## 2015-01-03 MED ORDER — HYDROCODONE-ACETAMINOPHEN 10-325 MG PO TABS: ORAL_TABLET | ORAL | Status: DC

## 2015-01-03 NOTE — Progress Notes (Signed)
   Subjective:    Patient ID: [name], male    DOB: 19-Mar-1946, 69 y.o.   MRN: [id number]  Diabetes He presents for his follow-up diabetic visit. He has type 2 diabetes mellitus. His disease course has been stable. There are no hypoglycemic associated symptoms. There are no diabetic associated symptoms. There are no hypoglycemic complications. Symptoms are stable. His overall blood glucose range is 70-90 mg/dl.    Sugars good numbers A1c 6.7%  Still has sig pain with the back. States hydrocodone definitely necessary for his back. Thumb on left side feeling numb accompanied by pain in the wrist. Seen in rheumatologist for this. Next  Claims compliance with all medication. Compliant with his lipid medications.   bp 134 over 78 Results for orders placed or performed in visit on 01/03/15  POCT glycosylated hemoglobin (Hb A1C)  Result Value Ref Range   Hemoglobin A1C 6.7   HM DIABETES EYE EXAM  Result Value Ref Range   HM Diabetic Eye Exam No Retinopathy No Retinopathy    Compliant with blood pressure medicine. No obvious side effects. Watching salt intake.   Review of Systems Chronic pain. No substantial headache no shortness of breath no chest pain no trouble breathing more so than baseline    Objective:   Physical Exam  Alert vital stable HEENT normal lungs clear. Heart regular in rhythm. Ankles edema.      Assessment & Plan:  Impression 1 type 2 diabetes good control #2 chronic pain discussed #3 hypertension good control #4 hyperlipidemia status uncertain. #5 hypothyroidism status uncertain plan appropriate blood work. Diet exercise discussed. Pain medicines refilled. Maintain same diabetes dose WSL

## 2015-01-07 LAB — LIPID PANEL
CHOLESTEROL TOTAL: 225 mg/dL — AB (ref 100–199)
Chol/HDL Ratio: 4.2 ratio units (ref 0.0–5.0)
HDL: 54 mg/dL (ref >39)
LDL Calculated: 121 mg/dL — ABNORMAL HIGH (ref 0–99)
TRIGLYCERIDES: 249 mg/dL — AB (ref 0–149)
VLDL CHOLESTEROL CAL: 50 mg/dL — AB (ref 5–40)

## 2015-01-07 LAB — TSH: TSH: 0.076 u[IU]/mL — ABNORMAL LOW (ref 0.450–4.500)

## 2015-01-07 LAB — HEPATIC FUNCTION PANEL
ALBUMIN: 3.8 g/dL (ref 3.6–4.8)
ALT: 18 IU/L (ref 0–44)
AST: 20 IU/L (ref 0–40)
Alkaline Phosphatase: 82 IU/L (ref 39–117)
BILIRUBIN, DIRECT: 0.22 mg/dL (ref 0.00–0.40)
Bilirubin Total: 0.3 mg/dL (ref 0.0–1.2)
Total Protein: 5.8 g/dL — ABNORMAL LOW (ref 6.0–8.5)

## 2015-01-11 ENCOUNTER — Encounter: Payer: Self-pay | Admitting: Family Medicine

## 2015-01-11 ENCOUNTER — Ambulatory Visit (INDEPENDENT_AMBULATORY_CARE_PROVIDER_SITE_OTHER): Payer: Medicare Other | Admitting: Family Medicine

## 2015-01-11 VITALS — BP 110/80 | Temp 98.2°F | Ht 63.0 in | Wt 146.1 lb

## 2015-01-11 DIAGNOSIS — M545 Low back pain: Secondary | ICD-10-CM | POA: Diagnosis not present

## 2015-01-11 DIAGNOSIS — I6523 Occlusion and stenosis of bilateral carotid arteries: Secondary | ICD-10-CM | POA: Diagnosis not present

## 2015-01-11 LAB — POCT URINALYSIS DIPSTICK
Protein, UA: 30
Spec Grav, UA: 1.015
pH, UA: 5

## 2015-01-11 MED ORDER — OXYCODONE-ACETAMINOPHEN 10-325 MG PO TABS: 1.0000 | ORAL_TABLET | ORAL | Status: DC | PRN

## 2015-01-11 MED ORDER — TIZANIDINE HCL 4 MG PO TABS: 4.0000 mg | ORAL_TABLET | Freq: Three times a day (TID) | ORAL | Status: DC | PRN

## 2015-01-11 NOTE — Progress Notes (Signed)
   Subjective:    Patient ID: [name], male    DOB: Sep 11, 1946, 69 y.o.   MRN: [id number]  Back Pain This is a new problem. The current episode started yesterday. The problem occurs constantly. The problem is unchanged. The pain is present in the lumbar spine. The quality of the pain is described as aching. The pain is severe. Stiffness is present all day. Treatments tried: hydrocodone. The treatment provided no relief.    Severe pain , worse with motion. Left lumbar region. Worse with motion. No major change in urination. Patient however is scared about kidney stones. No fever no chills  hydrocod not helping   Review of Systems  Musculoskeletal: Positive for back pain.   No rash    Objective:   Physical Exam   Alert talkative in some discomfort HEENT normal lungs clear heart rare rhythm. Lumbar musculature very tender left lumbar region. Negative straight leg raise. Urinalysis negative     Assessment & Plan:  Impression lumbar strain discussed plan local measures discussed. Change to oxycodone. Add Zanaflex. Warning signs discussed WSL

## 2015-01-13 ENCOUNTER — Telehealth: Payer: Self-pay | Admitting: Family Medicine

## 2015-01-13 NOTE — Telephone Encounter (Signed)
Patient was just seen and needing to know when medication will kick in because still in pain.

## 2015-01-13 NOTE — Telephone Encounter (Signed)
Notified patient needs to give it a few more days. Patient verbalized understanding.

## 2015-01-13 NOTE — Telephone Encounter (Signed)
Needs togive it a few more days

## 2015-01-18 ENCOUNTER — Other Ambulatory Visit: Payer: Self-pay | Admitting: Cardiology

## 2015-01-18 ENCOUNTER — Telehealth: Payer: Self-pay | Admitting: Family Medicine

## 2015-01-18 ENCOUNTER — Other Ambulatory Visit: Payer: Self-pay | Admitting: *Deleted

## 2015-01-18 MED ORDER — ATORVASTATIN CALCIUM 80 MG PO TABS: 80.0000 mg | ORAL_TABLET | Freq: Every day | ORAL | Status: DC

## 2015-01-18 MED ORDER — LEVOTHYROXINE SODIUM 88 MCG PO TABS: 88.0000 ug | ORAL_TABLET | Freq: Every day | ORAL | Status: DC

## 2015-01-18 NOTE — Telephone Encounter (Signed)
Ok 11 ref 

## 2015-01-18 NOTE — Telephone Encounter (Signed)
Med sent to pharmacy. Patient was notified.  

## 2015-01-18 NOTE — Telephone Encounter (Signed)
atorvastatin (LIPITOR) 80 MG tablet  Pt states he thought you were taking over all his meds, this one is  Still listed with Dr Wyline Mood, can we refill for him.   Last OV 4/12  He only has ONE pill left

## 2015-01-24 ENCOUNTER — Encounter (HOSPITAL_COMMUNITY)
Admission: RE | Admit: 2015-01-24 | Discharge: 2015-01-24 | Disposition: A | Discharge status: Home / Self Care | Payer: Medicare Other | Source: Ambulatory Visit | Attending: Ophthalmology | Admitting: Ophthalmology

## 2015-01-24 ENCOUNTER — Encounter (HOSPITAL_COMMUNITY): Payer: Self-pay

## 2015-01-26 ENCOUNTER — Other Ambulatory Visit: Payer: Self-pay | Admitting: Family Medicine

## 2015-01-27 MED ORDER — LIDOCAINE HCL 3.5 % OP GEL
OPHTHALMIC | Status: AC
  Filled 2015-01-27: qty 1

## 2015-01-27 MED ORDER — LIDOCAINE HCL (PF) 1 % IJ SOLN
INTRAMUSCULAR | Status: AC
  Filled 2015-01-27: qty 2

## 2015-01-27 MED ORDER — PHENYLEPHRINE HCL 2.5 % OP SOLN
OPHTHALMIC | Status: AC
Start: 2015-01-27 — End: 2015-01-27
  Filled 2015-01-27: qty 15

## 2015-01-27 MED ORDER — NEOMYCIN-POLYMYXIN-DEXAMETH 3.5-10000-0.1 OP SUSP
OPHTHALMIC | Status: AC
  Filled 2015-01-27: qty 5

## 2015-01-27 MED ORDER — CYCLOPENTOLATE-PHENYLEPHRINE OP SOLN OPTIME - NO CHARGE
OPHTHALMIC | Status: AC
  Filled 2015-01-27: qty 2

## 2015-01-27 MED ORDER — TETRACAINE HCL 0.5 % OP SOLN
OPHTHALMIC | Status: AC
  Filled 2015-01-27: qty 2

## 2015-01-30 ENCOUNTER — Ambulatory Visit (HOSPITAL_COMMUNITY)
Admission: RE | Admit: 2015-01-30 | Discharge: 2015-01-30 | Disposition: A | Discharge status: Home / Self Care | Payer: Medicare Other | Source: Ambulatory Visit | Attending: Ophthalmology | Admitting: Ophthalmology

## 2015-01-30 ENCOUNTER — Encounter (HOSPITAL_COMMUNITY): Payer: Self-pay | Admitting: *Deleted

## 2015-01-30 ENCOUNTER — Ambulatory Visit (HOSPITAL_COMMUNITY): Payer: Medicare Other | Admitting: Anesthesiology

## 2015-01-30 ENCOUNTER — Encounter (HOSPITAL_COMMUNITY)
Admission: RE | Disposition: A | Discharge status: Home / Self Care | Payer: Self-pay | Source: Ambulatory Visit | Attending: Ophthalmology

## 2015-01-30 DIAGNOSIS — H25811 Combined forms of age-related cataract, right eye: Secondary | ICD-10-CM | POA: Insufficient documentation

## 2015-01-30 HISTORY — PX: CATARACT EXTRACTION W/PHACO: SHX586

## 2015-01-30 LAB — GLUCOSE, CAPILLARY: Glucose-Capillary: 105 mg/dL — ABNORMAL HIGH (ref 70–99)

## 2015-01-30 SURGERY — PHACOEMULSIFICATION, CATARACT, WITH IOL INSERTION
Anesthesia: Monitor Anesthesia Care | Site: Eye | Laterality: Right

## 2015-01-30 MED ORDER — TETRACAINE HCL 0.5 % OP SOLN
1.0000 [drp] | OPHTHALMIC | Status: AC
  Administered 2015-01-30 (×3): 1 [drp] via OPHTHALMIC

## 2015-01-30 MED ORDER — FENTANYL CITRATE (PF) 100 MCG/2ML IJ SOLN
INTRAMUSCULAR | Status: AC
  Filled 2015-01-30: qty 2

## 2015-01-30 MED ORDER — FENTANYL CITRATE (PF) 100 MCG/2ML IJ SOLN
25.0000 ug | INTRAMUSCULAR | Status: AC
  Administered 2015-01-30: 25 ug via INTRAVENOUS

## 2015-01-30 MED ORDER — MIDAZOLAM HCL 2 MG/2ML IJ SOLN
INTRAMUSCULAR | Status: AC
  Filled 2015-01-30: qty 2

## 2015-01-30 MED ORDER — NEOMYCIN-POLYMYXIN-DEXAMETH 3.5-10000-0.1 OP SUSP
OPHTHALMIC | Status: DC | PRN
  Administered 2015-01-30: 1 [drp] via OPHTHALMIC

## 2015-01-30 MED ORDER — POVIDONE-IODINE 5 % OP SOLN
OPHTHALMIC | Status: DC | PRN
  Administered 2015-01-30: 1 via OPHTHALMIC

## 2015-01-30 MED ORDER — PROVISC 10 MG/ML IO SOLN
INTRAOCULAR | Status: DC | PRN
  Administered 2015-01-30: 0.85 mL via INTRAOCULAR

## 2015-01-30 MED ORDER — BSS IO SOLN
INTRAOCULAR | Status: DC | PRN
  Administered 2015-01-30: 15 mL

## 2015-01-30 MED ORDER — MIDAZOLAM HCL 2 MG/2ML IJ SOLN
1.0000 mg | INTRAMUSCULAR | Status: DC | PRN
  Administered 2015-01-30: 2 mg via INTRAVENOUS

## 2015-01-30 MED ORDER — LIDOCAINE HCL (PF) 1 % IJ SOLN
INTRAOCULAR | Status: DC | PRN
  Administered 2015-01-30: .8 mL via OPHTHALMIC

## 2015-01-30 MED ORDER — PHENYLEPHRINE HCL 2.5 % OP SOLN
1.0000 [drp] | OPHTHALMIC | Status: AC
  Administered 2015-01-30 (×3): 1 [drp] via OPHTHALMIC

## 2015-01-30 MED ORDER — LACTATED RINGERS IV SOLN
INTRAVENOUS | Status: DC
  Administered 2015-01-30: 07:00:00 via INTRAVENOUS

## 2015-01-30 MED ORDER — EPINEPHRINE HCL 1 MG/ML IJ SOLN
INTRAMUSCULAR | Status: AC
  Filled 2015-01-30: qty 1

## 2015-01-30 MED ORDER — LIDOCAINE HCL 3.5 % OP GEL
1.0000 "application " | Freq: Once | OPHTHALMIC | Status: AC
  Administered 2015-01-30: 1 via OPHTHALMIC

## 2015-01-30 MED ORDER — CYCLOPENTOLATE-PHENYLEPHRINE 0.2-1 % OP SOLN
1.0000 [drp] | OPHTHALMIC | Status: AC
  Administered 2015-01-30 (×3): 1 [drp] via OPHTHALMIC

## 2015-01-30 MED ORDER — PHENYLEPHRINE-KETOROLAC 1-0.3 % IO SOLN
INTRAOCULAR | Status: AC
  Filled 2015-01-30: qty 4

## 2015-01-30 MED ORDER — BSS IO SOLN
INTRAOCULAR | Status: DC | PRN
  Administered 2015-01-30: 500 mL via OPHTHALMIC

## 2015-01-30 SURGICAL SUPPLY — 11 items
CLOTH BEACON ORANGE TIMEOUT ST (SAFETY) ×2 IMPLANT
EYE SHIELD UNIVERSAL CLEAR (GAUZE/BANDAGES/DRESSINGS) ×2 IMPLANT
GLOVE BIOGEL PI IND STRL 7.5 (GLOVE) IMPLANT
GLOVE BIOGEL PI INDICATOR 7.5 (GLOVE) ×2
GLOVE EXAM NITRILE LRG STRL (GLOVE) ×2 IMPLANT
PAD ARMBOARD 7.5X6 YLW CONV (MISCELLANEOUS) ×2 IMPLANT
SIGHTPATH CAT PROC W REG LENS (Ophthalmic Related) ×3 IMPLANT
SYRINGE LUER LOK 1CC (MISCELLANEOUS) ×2 IMPLANT
TAPE SURG TRANSPORE 1 IN (GAUZE/BANDAGES/DRESSINGS) IMPLANT
TAPE SURGICAL TRANSPORE 1 IN (GAUZE/BANDAGES/DRESSINGS) ×2
WATER STERILE IRR 250ML POUR (IV SOLUTION) ×2 IMPLANT

## 2015-01-30 NOTE — Transfer of Care (Signed)
Immediate Anesthesia Transfer of Care Note  Patient: [name]  Procedure(s) Performed: Procedure(s) with comments: CATARACT EXTRACTION PHACO AND INTRAOCULAR LENS PLACEMENT RIGHT EYE (Right) - CDE:10.56  Patient Location: Short Stay  Anesthesia Type:MAC  Level of Consciousness: awake, alert , oriented and patient cooperative  Airway & Oxygen Therapy: Patient Spontanous Breathing  Post-op Assessment: Report given to RN, Post -op Vital signs reviewed and stable and Patient moving all extremities  Post vital signs: Reviewed and stable  Last Vitals:  Filed Vitals:   01/30/15 0720  BP: 103/69  Temp:   Resp: 19    Complications: No apparent anesthesia complications

## 2015-01-30 NOTE — Op Note (Signed)
Date of Admission: 01/30/2015  Date of Surgery: 01/30/2015   Pre-Op Dx: Cataract Right Eye  Post-Op Dx: Senile Combined Cataract Right  Eye,  Dx Code Z56.387  Surgeon: Gemma Payor, M.D.  Assistants: None  Anesthesia: Topical with MAC  Indications: Painless, progressive loss of vision with compromise of daily activities.  Surgery: Cataract Extraction with Intraocular lens Implant Right Eye  Discription: The patient had dilating drops and viscous lidocaine placed into the Right eye in the pre-op holding area. After transfer to the operating room, a time out was performed. The patient was then prepped and draped. Beginning with a 75 degree blade a paracentesis port was made at the surgeon's 2 o'clock position. The anterior chamber was then filled with 1% non-preserved lidocaine. This was followed by filling the anterior chamber with Provisc.  A 2.59mm keratome blade was used to make a clear corneal incision at the temporal limbus.  A bent cystatome needle was used to create a continuous tear capsulotomy. Hydrodissection was performed with balanced salt solution on a Fine canula. The lens nucleus was then removed using the phacoemulsification handpiece. Residual cortex was removed with the I&A handpiece. The anterior chamber and capsular bag were refilled with Provisc. A posterior chamber intraocular lens was placed into the capsular bag with it's injector. The implant was positioned with the Kuglan hook. The Provisc was then removed from the anterior chamber and capsular bag with the I&A handpiece. Stromal hydration of the main incision and paracentesis port was performed with BSS on a Fine canula. The wounds were tested for leak which was negative. The patient tolerated the procedure well. There were no operative complications. The patient was then transferred to the recovery room in stable condition.  Complications: None  Specimen: None  EBL: None  Prosthetic device: Hoya iSert 250, power 22.5 D,  SN [id number].

## 2015-01-30 NOTE — Discharge Instructions (Signed)
Cataract Surgery °Care After °Refer to this sheet in the next few weeks. These instructions provide you with information on caring for yourself after your procedure. Your caregiver may also give you more specific instructions. Your treatment has been planned according to current medical practices, but problems sometimes occur. Call your caregiver if you have any problems or questions after your procedure.  °HOME CARE INSTRUCTIONS  °· Avoid strenuous activities as directed by your caregiver. °· Ask your caregiver when you can resume driving. °· Use eyedrops or other medicines to help healing and control pressure inside your eye as directed by your caregiver. °· Only take over-the-counter or prescription medicines for pain, discomfort, or fever as directed by your caregiver. °· Do not to touch or rub your eyes. °· You may be instructed to use a protective shield during the first few days and nights after surgery. If not, wear sunglasses to protect your eyes. This is to protect the eye from pressure or from being accidentally bumped. °· Keep the area around your eye clean and dry. Avoid swimming or allowing water to hit you directly in the face while showering. Keep soap and shampoo out of your eyes. °· Do not bend or lift heavy objects. Bending increases pressure in the eye. You can walk, climb stairs, and do light household chores. °· Do not put a contact lens into the eye that had surgery until your caregiver says it is okay to do so. °· Ask your doctor when you can return to work. This will depend on the kind of work that you do. If you work in a dusty environment, you may be advised to wear protective eyewear for a period of time. °· Ask your caregiver when it will be safe to engage in sexual activity. °· Continue with your regular eye exams as directed by your caregiver. °What to expect: °· It is normal to feel itching and mild discomfort for a few days after cataract surgery. Some fluid discharge is also common,  and your eye may be sensitive to light and touch. °· After 1 to 2 days, even moderate discomfort should disappear. In most cases, healing will take about 6 weeks. °· If you received an intraocular lens (IOL), you may notice that colors are very bright or have a blue tinge. Also, if you have been in bright sunlight, everything may appear reddish for a few hours. If you see these color tinges, it is because your lens is clear and no longer cloudy. Within a few months after receiving an IOL, these extra colors should go away. When you have healed, you will probably need new glasses. °SEEK MEDICAL CARE IF:  °· You have increased bruising around your eye. °· You have discomfort not helped by medicine. °SEEK IMMEDIATE MEDICAL CARE IF:  °· You have a  fever. °· You have a worsening or sudden vision loss. °· You have redness, swelling, or increasing pain in the eye. °· You have a thick discharge from the eye that had surgery. °MAKE SURE YOU: °· Understand these instructions. °· Will watch your condition. °· Will get help right away if you are not doing well or get worse. °Document Released: 03/29/2005 Document Revised: 12/02/2011 Document Reviewed: 05/03/2011 °[employer]® Patient Information ©2015 [employer]. This information is not intended to replace advice given to you by your health care provider. Make sure you discuss any questions you have with your health care provider. ° °

## 2015-01-30 NOTE — Anesthesia Preprocedure Evaluation (Signed)
Anesthesia Evaluation  Patient identified by MRN, date of birth, ID band Patient awake    Reviewed: Allergy & Precautions, H&P , NPO status , Patient's Chart, lab work & pertinent test results, reviewed documented beta blocker date and time   History of Anesthesia Complications Negative for: history of anesthetic complications  Airway Mallampati: I   Neck ROM: Full    Dental  (+) Partial Lower   Pulmonary shortness of breath and with exertion, former smoker,  breath sounds clear to auscultation        Cardiovascular hypertension, Pt. on medications and Pt. on home beta blockers + angina + CAD and + Peripheral Vascular Disease + dysrhythmias Atrial Fibrillation Rhythm:Regular Rate:Normal     Neuro/Psych  Neuromuscular disease    GI/Hepatic GERD-  Medicated,achalasia   Endo/Other  diabetes, Type 2, Oral Hypoglycemic AgentsHypothyroidism   Renal/GU      Musculoskeletal  (+) Arthritis -, Rheumatoid disorders,    Abdominal   Peds  Hematology   Anesthesia Other Findings   Reproductive/Obstetrics                             Anesthesia Physical Anesthesia Plan  ASA: III  Anesthesia Plan: MAC   Post-op Pain Management:    Induction: Intravenous  Airway Management Planned: Nasal ETT  Additional Equipment:   Intra-op Plan:   Post-operative Plan:   Informed Consent: I have reviewed the patients History and Physical, chart, labs and discussed the procedure including the risks, benefits and alternatives for the proposed anesthesia with the patient or authorized representative who has indicated his/her understanding and acceptance.     Plan Discussed with:   Anesthesia Plan Comments:         Anesthesia Quick Evaluation

## 2015-01-30 NOTE — H&P (Signed)
I have reviewed the H&P, the patient was re-examined, and I have identified no interval changes in medical condition and plan of care since the history and physical of record  

## 2015-01-30 NOTE — Anesthesia Postprocedure Evaluation (Signed)
  Anesthesia Post-op Note  Patient: [name]  Procedure(s) Performed: Procedure(s) with comments: CATARACT EXTRACTION PHACO AND INTRAOCULAR LENS PLACEMENT RIGHT EYE (Right) - CDE:10.56  Patient Location: Short Stay  Anesthesia Type:MAC  Level of Consciousness: awake, alert , oriented and patient cooperative  Airway and Oxygen Therapy: Patient Spontanous Breathing  Post-op Pain: none  Post-op Assessment: Post-op Vital signs reviewed, Patient's Cardiovascular Status Stable, Respiratory Function Stable, Patent Airway, No signs of Nausea or vomiting and Pain level controlled  Post-op Vital Signs: Reviewed and stable  Last Vitals:  Filed Vitals:   01/30/15 0720  BP: 103/69  Temp:   Resp: 19    Complications: No apparent anesthesia complications

## 2015-01-31 ENCOUNTER — Encounter (HOSPITAL_COMMUNITY): Payer: Self-pay | Admitting: Ophthalmology

## 2015-02-06 ENCOUNTER — Other Ambulatory Visit (HOSPITAL_COMMUNITY): Payer: Medicare Other

## 2015-02-06 ENCOUNTER — Ambulatory Visit: Payer: Medicare Other | Admitting: Family

## 2015-02-06 ENCOUNTER — Encounter (HOSPITAL_COMMUNITY): Payer: Medicare Other

## 2015-03-06 ENCOUNTER — Encounter: Payer: Self-pay | Admitting: Family

## 2015-03-07 ENCOUNTER — Other Ambulatory Visit: Payer: Self-pay | Admitting: *Deleted

## 2015-03-07 DIAGNOSIS — Z48812 Encounter for surgical aftercare following surgery on the circulatory system: Secondary | ICD-10-CM

## 2015-03-07 DIAGNOSIS — I739 Peripheral vascular disease, unspecified: Secondary | ICD-10-CM

## 2015-03-08 ENCOUNTER — Ambulatory Visit (HOSPITAL_COMMUNITY)
Admission: RE | Admit: 2015-03-08 | Discharge: 2015-03-08 | Disposition: A | Discharge status: Home / Self Care | Payer: Medicare Other | Source: Ambulatory Visit | Attending: Family | Admitting: Family

## 2015-03-08 ENCOUNTER — Other Ambulatory Visit: Payer: Self-pay | Admitting: *Deleted

## 2015-03-08 ENCOUNTER — Other Ambulatory Visit: Payer: Self-pay | Admitting: Surgery

## 2015-03-08 ENCOUNTER — Ambulatory Visit (INDEPENDENT_AMBULATORY_CARE_PROVIDER_SITE_OTHER)
Admission: RE | Admit: 2015-03-08 | Discharge: 2015-03-08 | Disposition: A | Discharge status: Home / Self Care | Payer: Medicare Other | Source: Ambulatory Visit | Attending: Family | Admitting: Family

## 2015-03-08 ENCOUNTER — Ambulatory Visit (INDEPENDENT_AMBULATORY_CARE_PROVIDER_SITE_OTHER): Payer: Medicare Other | Admitting: Family

## 2015-03-08 ENCOUNTER — Encounter: Payer: Self-pay | Admitting: Family

## 2015-03-08 VITALS — BP 157/78 | HR 57 | Resp 14 | Ht 68.0 in | Wt 145.0 lb

## 2015-03-08 DIAGNOSIS — Z95820 Peripheral vascular angioplasty status with implants and grafts: Secondary | ICD-10-CM | POA: Insufficient documentation

## 2015-03-08 DIAGNOSIS — I6523 Occlusion and stenosis of bilateral carotid arteries: Secondary | ICD-10-CM

## 2015-03-08 DIAGNOSIS — I739 Peripheral vascular disease, unspecified: Secondary | ICD-10-CM

## 2015-03-08 DIAGNOSIS — Z48812 Encounter for surgical aftercare following surgery on the circulatory system: Secondary | ICD-10-CM | POA: Diagnosis not present

## 2015-03-08 DIAGNOSIS — Z87891 Personal history of nicotine dependence: Secondary | ICD-10-CM

## 2015-03-08 DIAGNOSIS — Z95828 Presence of other vascular implants and grafts: Secondary | ICD-10-CM

## 2015-03-08 DIAGNOSIS — I70203 Unspecified atherosclerosis of native arteries of extremities, bilateral legs: Secondary | ICD-10-CM | POA: Diagnosis not present

## 2015-03-08 DIAGNOSIS — Z9889 Other specified postprocedural states: Secondary | ICD-10-CM | POA: Diagnosis not present

## 2015-03-08 NOTE — Progress Notes (Signed)
[facility] [place] HISTORY AND PHYSICAL -PAD  History of Present Illness [name] is a 69 y.o. male patient of Dr. Myra Gianotti who is status post distal left superficial femoral to posterior tibial artery bypass graft with ipsilateral non-reversed, translocated saphenous vein, and left fifth toe amputation with metatarsal head on 01/28/2013. This was done in the setting of an ischemic left fifth toe. He then had left PTA of posterior tibial artery on 10/05/13.  On 11/01/14 Dr. Myra Gianotti performed an aortogram to evaluate bypass graft stenosis. Pt states Dr. Myra Gianotti told his family after the aortogram that "there was nothing wrong", no procedure was necessary. However, pt states his feet have not felt cold since this 11/01/14 aortogram.  Patient returns today for follow up of PAD; he is due for Duplex evaluation of minimal carotid artery stenosis in July 2017.  Pt states that he continues to perform leg exercises several times/day while sitting.  Since the 10/05/2013 angioplasty of his left posterior tibial artery, patient state he has had no further resting pain in his left leg.  He denies claudication symptoms in his legs with walking, denies non healing wounds.  Reports that he has 2 pinched nerves in his lumbar spine that limits his walking due to pain, his worst complaint is right low back pain with radiculopathy and RA pain. He had back surgery September, 2015, Dr. Channing Mutters, neurosurgeon. He was hospitalized at [facility] in May, 2015 for 4 days days for pneumonia, acute renal failure, dehydration, and c.diff.  He will have a cyst excised at his left elbow to help relieve tingling and numbness in left hand, Dr. Cleophas Dunker.   He had cataract surgery both eyes recently.   Pt Diabetic: "borderline", no DM medications  Pt smoker: former smoker, quit in 1967   Pt meds include:  Statin :Yes  Betablocker: Yes  ASA: Yes  Other anticoagulants/antiplatelets: no      Past Medical History  Diagnosis Date  . 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 Hyperlipidemia   . Hypertension   . PVD (peripheral vascular disease)     Left iliac PCI/stent  . Cerebrovascular disease   . Tobacco abuse, in remission     Remote  . GERD (gastroesophageal reflux disease)   . Hypothyroid   . Allergic rhinitis   . Chronic lung disease     ?  Rheumatoid lung; ?  Adverse reaction to methotrexate  . Schatzki's ring   . Hx of Clostridium difficile infection     Recurrent; associated 40 pound weight loss  . Achalasia   . Diabetes mellitus     borderline no meds  . Atrial fibrillation   . Chronic back pain   . 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 Cervical spondylosis   . DDD (degenerative disc disease), lumbar   . Pneumonia   . Carotid artery occlusion   . Nephrolithiasis 2010    s/p lithotripsy  . Hiatal hernia     Social History History  Substance Use Topics  . Smoking status: Former Smoker -- 0.50 packs/day for 5 years    Types: Cigarettes    Start date: 09/23/1956    Quit date: 09/23/1965  . Smokeless tobacco: Former Neurosurgeon  . Alcohol Use: No    Family History Family History  Problem Relation Age of Onset  . Stomach cancer Mother 59  . Cancer Mother     Stomach  . Heart disease Mother   .  Heart attack Mother   . Heart attack Father   . Heart disease Father   . Heart disease Brother   . Leukemia Brother   . Lung disease Son     infant  . Lung disease Daughter     infant    Past Surgical History  Procedure Laterality Date  . Coronary artery bypass graft      x6 In 1998  . Total knee arthroplasty      Right  . Ankle fusion      Left  . Wrist fusion      Right  . Shoulder surgery    . Colonoscopy  09/12/2011    Dr. Elly Modena hemorrhoids, normal colon and distal terminal ileum. Random bx negative. Stool for CDiff positive.  . Esophagogastroduodenoscopy  09/13/2011    Dr.  Lyndle Herrlich plawues mid-esophagus KOH negative. Distal esophageal ring and ulcer. Mild gastritis. duodenal diverticulum. Savaory dilation 81mm.   Gaspar Bidding dilation  09/13/2011  . Esophageal manometry  09/30/2011    Procedure: ESOPHAGEAL MANOMETRY (EM);  Surgeon: Rob Bunting, MD;  Location: [facility];  Service: Endoscopy;  Laterality: N/A;  . Esophagogastroduodenoscopy  09/12/2011    Dr. Rinaldo Ratel rings s/p dilation  . Cardiac surgery    . Esophagomyotomy  11/12/11    [facility]- with DOR antireflux surgery  . Esophagogastroduodenoscopy  06/25/2012    Procedure: ESOPHAGOGASTRODUODENOSCOPY (EGD);  Surgeon: Corbin Ade, MD;  Location: [facility];  Service: Endoscopy;  Laterality: N/A;  7:30  . Savory dilation  06/25/2012    Procedure: SAVORY DILATION;  Surgeon: Corbin Ade, MD;  Location: [facility];  Service: Endoscopy;  Laterality: N/A;  Elease Hashimoto dilation  06/25/2012    Procedure: Elease Hashimoto DILATION;  Surgeon: Corbin Ade, MD;  Location: [facility];  Service: Endoscopy;  Laterality: N/A;  . Ankle fusion  09/01/2012    Procedure: ANKLE FUSION;  Surgeon: Valeria Batman, MD;  Location: [facility] OR;  Service: Orthopedics;  Laterality: Left;  Take [name] of angulated tibial/fibula Fractures  . Femoral-tibial bypass graft Left 01/28/2013    Procedure: BYPASS GRAFT FEMORAL-TIBIAL ARTERY;  Surgeon: Nada Libman, MD;  Location: [facility] OR;  Service: Vascular;  Laterality: Left;  Ultrasound Guided  . Amputation Left 01/28/2013    Procedure: AMPUTATION LEFT 5TH TOE;  Surgeon: Nada Libman, MD;  Location: [facility] OR;  Service: Vascular;  Laterality: Left;  . Spine surgery  11-03-13  . Abdominal aortagram N/A 01/01/2013    Procedure: ABDOMINAL Ronny Flurry;  Surgeon: Sherren Kerns, MD;  Location: [facility] CATH LAB;  Service: Cardiovascular;  Laterality: N/A;  . Abdominal aortagram N/A 10/05/2013    Procedure: ABDOMINAL AORTAGRAM;  Surgeon: Nada Libman, MD;  Location: [facility] CATH LAB;  Service:  Cardiovascular;  Laterality: N/A;  . Lower extremity angiogram Left 10/05/2013    Procedure: LOWER EXTREMITY ANGIOGRAM;  Surgeon: Nada Libman, MD;  Location: [facility] CATH LAB;  Service: Cardiovascular;  Laterality: Left;  . Abdominal aortagram N/A 11/01/2014    Procedure: ABDOMINAL Ronny Flurry;  Surgeon: Nada Libman, MD;  Location: [facility] CATH LAB;  Service: Cardiovascular;  Laterality: N/A;  . Joint replacement Right 1999    Knee  . Toe amputation Left 09/05/2014    Removed left fifth toe  . Cataract extraction w/phaco Left 12/22/2014    Procedure: CATARACT EXTRACTION PHACO AND INTRAOCULAR LENS PLACEMENT (IOC);  Surgeon: Gemma Payor, MD;  Location: [facility] ORS;  Service: Ophthalmology;  Laterality: Left;  CDE:11.89  . Cataract extraction  w/phaco Right 01/30/2015    Procedure: CATARACT EXTRACTION PHACO AND INTRAOCULAR LENS PLACEMENT RIGHT EYE;  Surgeon: Gemma Payor, MD;  Location: [facility] ORS;  Service: Ophthalmology;  Laterality: Right;  CDE:10.56  . Eye surgery Left December 22, 2014    Cataract  . Eye surgery Right December 31, 2014    Cataract    Allergies  Allergen Reactions  . Clindamycin/Lincomycin Other (See Comments)    Kidney Failure  . Amoxicillin Hives and Other (See Comments)    Hallucinations   . Ciprofloxacin Other (See Comments)    C-diff  . Other Hives, Diarrhea and Other (See Comments)    "Mycin" Causes C-diff  . Doxycycline Nausea And Vomiting  . Levofloxacin Nausea And Vomiting    Current Outpatient Prescriptions  Medication Sig Dispense Refill  . albuterol (PROVENTIL HFA;VENTOLIN HFA) 108 (90 BASE) MCG/ACT inhaler Inhale 2 puffs into the lungs every 4 (four) hours as needed for wheezing or shortness of breath. 1 Inhaler 2  . ALPRAZolam (XANAX) 1 MG tablet TAKE 1 TABLET BY MOUTH EVERY DAY AS NEEDED FOR ANXIETY (Patient taking differently: Take 0.5-1 mg by mouth at bedtime as needed for anxiety. ) 30 tablet 5  . aspirin EC 81 MG tablet Take 81 mg by mouth daily.      Marland Kitchen atenolol  (TENORMIN) 25 MG tablet TAKE (1) TABLET BY MOUTH TWICE DAILY. (Patient taking differently: Take 25 mg by mouth 2 (two) times daily. ) 60 tablet 5  . atorvastatin (LIPITOR) 80 MG tablet Take 1 tablet (80 mg total) by mouth daily. 30 tablet 11  . finasteride (PROSCAR) 5 MG tablet Take 5 mg by mouth daily.    . folic acid (FOLVITE) 1 MG tablet Take 1 mg by mouth daily.    Marland Kitchen HYDROcodone-acetaminophen (NORCO) 10-325 MG per tablet Take 1 tablet up to five times daily 150 tablet 0  . ibuprofen (ADVIL,MOTRIN) 800 MG tablet TAKE ONE TABLET BY MOUTH THREE TIMES DAILY. 270 tablet 3  . leflunomide (ARAVA) 20 MG tablet Take 40 mg by mouth daily.     Marland Kitchen levothyroxine (SYNTHROID, LEVOTHROID) 88 MCG tablet Take 1 tablet (88 mcg total) by mouth daily. 30 tablet 5  . lisinopril (PRINIVIL,ZESTRIL) 20 MG tablet TAKE ONE TABLET BY MOUTH DAILY. 30 tablet 5  . Melatonin 3 MG TABS Take 3-6 mg by mouth at bedtime as needed.     . Multiple Vitamin (MULTIVITAMIN) capsule Take 1 capsule by mouth daily.    Marland Kitchen omeprazole (PRILOSEC OTC) 20 MG tablet Take 20 mg by mouth 2 (two) times daily.    Marland Kitchen OVER THE COUNTER MEDICATION Take 1 capsule by mouth daily. "Florajen 3 Probiotic"    . oxyCODONE-acetaminophen (PERCOCET) 10-325 MG per tablet Take 1 tablet by mouth every 4 (four) hours as needed for pain. Don't use with hydrocodone 28 tablet 0  . predniSONE (DELTASONE) 10 MG tablet TAKE ONE TABLET BY MOUTH ONCE DAILY. (Patient taking differently: Take 10mg  by mouth once daily) 30 tablet 6  . tamsulosin (FLOMAX) 0.4 MG CAPS capsule Take 0.4 mg by mouth daily.    Marland Kitchen tiZANidine (ZANAFLEX) 4 MG tablet TAKE 1 TABLET BY MOUTH THREE TIMES DAILY AS NEEDED FOR MUSCLE SPASMS. 30 tablet 5  . Vitamin D, Ergocalciferol, (DRISDOL) 50000 UNITS CAPS capsule Take 50,000 Units by mouth every 7 (seven) days. On Monday.     No current facility-administered medications for this visit.    ROS: See HPI for pertinent positives and negatives.   Physical  Examination  Filed Vitals:   03/08/15 1452  BP: 157/78  Pulse: 57  Resp: 14  Height: 5\' 8"  (1.727 m)  Weight: 145 lb (65.772 kg)  SpO2: 95%   Body mass index is 22.05 kg/(m^2).  General: A&O x 3, WDWN  Gait: slow, deliberate Eyes: Pupils are equal  Pulmonary: respirations are non labored Cardiac: regular Rythm   Carotid Bruits  Left  Right    Negative  Positive   Aorta: is not palpable  Radial pulses: are 2+ and =   VASCULAR EXAM:  Extremities without ischemic changes  without Gangrene; without open wounds. Right great toe is no longer red. Left 5th toe is surgically absent.   LE Pulses  LEFT  RIGHT   FEMORAL  palpable  palpable   POPLITEAL  not palpable  not palpable   POSTERIOR TIBIAL  1+ palpable, triphasic by Doppler  not palpable, monophasic by Doppler   DORSALIS PEDIS  ANTERIOR TIBIAL  faintly palpable, monophasic by Doppler  faintly palpable, monophasic by Doppler    Abdomen: soft, NT, no palpable masses.  Skin: no rashes, no ulcers.  Musculoskeletal: some generalized muscle atrophy, RA deformities noted in hands, severe kyphosis, left leg shorter than right, both ankles with limited ROM.  Neurologic: A&O X 3; Appropriate Affect ; SENSATION: normal; MOTOR FUNCTION: moving all extremities equally, motor strength 4/5 throughout. Speech is fluent/normal.  CN 2-12 intact.          Non-Invasive Vascular Imaging: DATE: 03/08/2015 LOWER EXTREMITY ARTERIAL EVALUATION    INDICATION: Follow up bypass graft     PREVIOUS INTERVENTION(S): Left distal superficial femoral to distal posterior tibial artery bypass graft on 01/28/13    DUPLEX EXAM: Duplex evaluation of the lower extremity arterial system to include the common femoral, superficial femoral, popliteal, and tibial arteries and bypass graft(s) and/or stent(s) if present.      FINDINGS:                                       RIGHT LOWER EXTREMITY:   Diffuse  plaque and vessel calcifications noted throughout the right lower extremity arterial system.    LEFT LOWER EXTREMITY:  Vessel calcification noted in the left inflow and outflow arteries..       See the attached diagram for velocities.  IMPRESSION:  1. Doppler velocities suggest 50-99% stenoses at the right distal superficial femoral and mid to distal popliteal artery levels. 2. Patent left leg bypass graft with Doppler velocities suggestive of a 50-70% stenosis of the left distal bypass graft at the mid calf level appears to possibly be due to a retained valve.     ANKLE/BRACHIAL INDEX - Right = NA                Left =   NA        (Please see complete report)                 ASSESSMENT: [name] is a 69 y.o. male who is status post distal left superficial femoral to posterior tibial artery bypass graft with ipsilateral non-reversed, translocated saphenous vein, and left fifth toe amputation with metatarsal head on 01/28/2013. This was done in the setting of an ischemic left fifth toe. He then had left PTA of posterior tibial artery on 10/05/13.  On 11/01/14 Dr. 12/31/14 performed an aortogram to evaluate bypass graft stenosis. Pt states Dr. Myra Gianotti  told his family after the aortogram that "there was nothing wrong", no procedure was necessary. However, pt states his feet have not felt cold since this 11/01/14 aortogram. Pedal pulses are slightly palpable this visit, not palpable on previous visit.  He has no signs of ischemia in his feet, no ulcers.  He has severe RA and lumbar spine issues with pain.  Carotid Duplex due July 2017.   PLAN:  Continue daily seated leg exercises.  I discussed in depth with the patient the nature of atherosclerosis, and emphasized the importance of maximal medical management including strict control of blood pressure, blood glucose, and lipid levels, obtaining regular exercise, and continued cessation of smoking.  The patient is aware that  without maximal medical management the underlying atherosclerotic disease process will progress, limiting the benefit of any interventions.  Based on the patient's vascular studies and examination, and after discussing with Dr. Edilia Bo,  pt will return to clinic in 6 months with ABI's and bilateral LE arterial Duplex.   The patient was given information about PAD including signs, symptoms, treatment, what symptoms should prompt the patient to seek immediate medical care, and risk reduction measures to take.  Charisse March, RN, MSN, FNP-C [facility] [place] Phone: [phone]  Clinic MD: Edilia Bo  03/08/2015 3:29 PM

## 2015-03-08 NOTE — Patient Instructions (Signed)
Peripheral Vascular Disease Peripheral Vascular Disease (PVD), also called Peripheral Arterial Disease (PAD), is a circulation problem caused by cholesterol (atherosclerotic plaque) deposits in the arteries. PVD commonly occurs in the lower extremities (legs) but it can occur in other areas of the body, such as your arms. The cholesterol buildup in the arteries reduces blood flow which can cause pain and other serious problems. The presence of PVD can place a person at risk for Coronary Artery Disease (CAD).  CAUSES  Causes of PVD can be many. It is usually associated with more than one risk factor such as:   High Cholesterol.  Smoking.  Diabetes.  Lack of exercise or inactivity.  High blood pressure (hypertension).  Obesity.  Family history. SYMPTOMS   When the lower extremities are affected, patients with PVD may experience:  Leg pain with exertion or physical activity. This is called INTERMITTENT CLAUDICATION. This may present as cramping or numbness with physical activity. The location of the pain is associated with the level of blockage. For example, blockage at the abdominal level (distal abdominal aorta) may result in buttock or hip pain. Lower leg arterial blockage may result in calf pain.  As PVD becomes more severe, pain can develop with less physical activity.  In people with severe PVD, leg pain may occur at rest.  Other PVD signs and symptoms:  Leg numbness or weakness.  Coldness in the affected leg or foot, especially when compared to the other leg.  A change in leg color.  Patients with significant PVD are more prone to ulcers or sores on toes, feet or legs. These may take longer to heal or may reoccur. The ulcers or sores can become infected.  If signs and symptoms of PVD are ignored, gangrene may occur. This can result in the loss of toes or loss of an entire limb.  Not all leg pain is related to PVD. Other medical conditions can cause leg pain such  as:  Blood clots (embolism) or Deep Vein Thrombosis.  Inflammation of the blood vessels (vasculitis).  Spinal stenosis. DIAGNOSIS  Diagnosis of PVD can involve several different types of tests. These can include:  Pulse Volume Recording Method (PVR). This test is simple, painless and does not involve the use of X-rays. PVR involves measuring and comparing the blood pressure in the arms and legs. An ABI (Ankle-Brachial Index) is calculated. The normal ratio of blood pressures is 1. As this number becomes smaller, it indicates more severe disease.  < 0.95 - indicates significant narrowing in one or more leg vessels.  <0.8 - there will usually be pain in the foot, leg or buttock with exercise.  <0.4 - will usually have pain in the legs at rest.  <0.25 - usually indicates limb threatening PVD.  Doppler detection of pulses in the legs. This test is painless and checks to see if you have a pulses in your legs/feet.  A dye or contrast material (a substance that highlights the blood vessels so they show up on x-ray) may be given to help your caregiver better see the arteries for the following tests. The dye is eliminated from your body by the kidney's. Your caregiver may order blood work to check your kidney function and other laboratory values before the following tests are performed:  Magnetic Resonance Angiography (MRA). An MRA is a picture study of the blood vessels and arteries. The MRA machine uses a large magnet to produce images of the blood vessels.  Computed Tomography Angiography (CTA). A CTA   is a specialized x-ray that looks at how the blood flows in your blood vessels. An IV may be inserted into your arm so contrast dye can be injected.  Angiogram. Is a procedure that uses x-rays to look at your blood vessels. This procedure is minimally invasive, meaning a small incision (cut) is made in your groin. A small tube (catheter) is then inserted into the artery of your groin. The catheter  is guided to the blood vessel or artery your caregiver wants to examine. Contrast dye is injected into the catheter. X-rays are then taken of the blood vessel or artery. After the images are obtained, the catheter is taken out. TREATMENT  Treatment of PVD involves many interventions which may include:  Lifestyle changes:  Quitting smoking.  Exercise.  Following a low fat, low cholesterol diet.  Control of diabetes.  Foot care is very important to the PVD patient. Good foot care can help prevent infection.  Medication:  Cholesterol-lowering medicine.  Blood pressure medicine.  Anti-platelet drugs.  Certain medicines may reduce symptoms of Intermittent Claudication.  Interventional/Surgical options:  Angioplasty. An Angioplasty is a procedure that inflates a balloon in the blocked artery. This opens the blocked artery to improve blood flow.  Stent Implant. A wire mesh tube (stent) is placed in the artery. The stent expands and stays in place, allowing the artery to remain open.  Peripheral Bypass Surgery. This is a surgical procedure that reroutes the blood around a blocked artery to help improve blood flow. This type of procedure may be performed if Angioplasty or stent implants are not an option. SEEK IMMEDIATE MEDICAL CARE IF:   You develop pain or numbness in your arms or legs.  Your arm or leg turns cold, becomes blue in color.  You develop redness, warmth, swelling and pain in your arms or legs. MAKE SURE YOU:   Understand these instructions.  Will watch your condition.  Will get help right away if you are not doing well or get worse. Document Released: 10/17/2004 Document Revised: 12/02/2011 Document Reviewed: 09/13/2008 ExitCare Patient Information 2015 [employer]. This information is not intended to replace advice given to you by your health care provider. Make sure you discuss any questions you have with your health care provider.   Stroke  Prevention Some medical conditions and behaviors are associated with an increased chance of having a stroke. You may prevent a stroke by making healthy choices and managing medical conditions. HOW CAN I REDUCE MY RISK OF HAVING A STROKE?   Stay physically active. Get at least 30 minutes of activity on most or all days.  Do not smoke. It may also be helpful to avoid exposure to secondhand smoke.  Limit alcohol use. Moderate alcohol use is considered to be:  No more than 2 drinks per day for men.  No more than 1 drink per day for nonpregnant women.  Eat healthy foods. This involves:  Eating 5 or more servings of fruits and vegetables a day.  Making dietary changes that address high blood pressure (hypertension), high cholesterol, diabetes, or obesity.  Manage your cholesterol levels.  Making food choices that are high in fiber and low in saturated fat, trans fat, and cholesterol may control cholesterol levels.  Take any prescribed medicines to control cholesterol as directed by your health care provider.  Manage your diabetes.  Controlling your carbohydrate and sugar intake is recommended to manage diabetes.  Take any prescribed medicines to control diabetes as directed by your health care provider.    Control your hypertension.  Making food choices that are low in salt (sodium), saturated fat, trans fat, and cholesterol is recommended to manage hypertension.  Take any prescribed medicines to control hypertension as directed by your health care provider.  Maintain a healthy weight.  Reducing calorie intake and making food choices that are low in sodium, saturated fat, trans fat, and cholesterol are recommended to manage weight.  Stop drug abuse.  Avoid taking birth control pills.  Talk to your health care provider about the risks of taking birth control pills if you are over 35 years old, smoke, get migraines, or have ever had a blood clot.  Get evaluated for sleep  disorders (sleep apnea).  Talk to your health care provider about getting a sleep evaluation if you snore a lot or have excessive sleepiness.  Take medicines only as directed by your health care provider.  For some people, aspirin or blood thinners (anticoagulants) are helpful in reducing the risk of forming abnormal blood clots that can lead to stroke. If you have the irregular heart rhythm of atrial fibrillation, you should be on a blood thinner unless there is a good reason you cannot take them.  Understand all your medicine instructions.  Make sure that other conditions (such as anemia or atherosclerosis) are addressed. SEEK IMMEDIATE MEDICAL CARE IF:   You have sudden weakness or numbness of the face, arm, or leg, especially on one side of the body.  Your face or eyelid droops to one side.  You have sudden confusion.  You have trouble speaking (aphasia) or understanding.  You have sudden trouble seeing in one or both eyes.  You have sudden trouble walking.  You have dizziness.  You have a loss of balance or coordination.  You have a sudden, severe headache with no known cause.  You have new chest pain or an irregular heartbeat. Any of these symptoms may represent a serious problem that is an emergency. Do not wait to see if the symptoms will go away. Get medical help at once. Call your local emergency services (911 in [place] Do not drive yourself to the hospital. Document Released: 10/17/2004 Document Revised: 01/24/2014 Document Reviewed: 03/12/2013 ExitCare Patient Information 2015 [employer]. This information is not intended to replace advice given to you by your health care provider. Make sure you discuss any questions you have with your health care provider.  

## 2015-04-04 ENCOUNTER — Other Ambulatory Visit: Payer: Self-pay | Admitting: Family Medicine

## 2015-04-04 ENCOUNTER — Ambulatory Visit (INDEPENDENT_AMBULATORY_CARE_PROVIDER_SITE_OTHER): Payer: Medicare Other | Admitting: Family Medicine

## 2015-04-04 ENCOUNTER — Encounter: Payer: Self-pay | Admitting: Family Medicine

## 2015-04-04 VITALS — BP 112/70 | Ht 63.0 in | Wt 146.2 lb

## 2015-04-04 DIAGNOSIS — I6523 Occlusion and stenosis of bilateral carotid arteries: Secondary | ICD-10-CM | POA: Diagnosis not present

## 2015-04-04 DIAGNOSIS — Z79891 Long term (current) use of opiate analgesic: Secondary | ICD-10-CM

## 2015-04-04 DIAGNOSIS — E119 Type 2 diabetes mellitus without complications: Secondary | ICD-10-CM

## 2015-04-04 LAB — POCT GLYCOSYLATED HEMOGLOBIN (HGB A1C): Hemoglobin A1C: 5.7

## 2015-04-04 MED ORDER — CEPHALEXIN 500 MG PO CAPS
500.0000 mg | ORAL_CAPSULE | Freq: Three times a day (TID) | ORAL | Status: DC
Start: 2015-04-04 — End: 2015-06-22

## 2015-04-04 MED ORDER — HYDROCODONE-ACETAMINOPHEN 10-325 MG PO TABS: ORAL_TABLET | ORAL | Status: DC

## 2015-04-04 NOTE — Patient Instructions (Signed)

## 2015-04-04 NOTE — Progress Notes (Signed)
   Subjective:    Patient ID: [name], male    DOB: 13-Dec-1945, 69 y.o.   MRN: [id number]  Diabetes He presents for his follow-up diabetic visit. He has type 2 diabetes mellitus. His disease course has been stable. There are no hypoglycemic associated symptoms. There are no diabetic associated symptoms. There are no hypoglycemic complications. There are no diabetic complications. There are no known risk factors for coronary artery disease. When asked about current treatments, none were reported. He is compliant with treatment all of the time.   This patient was seen today for chronic pain  The medication list was reviewed and updated.   -Compliance with pain medication: yes  The patient was advised the importance of maintaining medication and not using illegal substances with these.  Refills needed: yes  The patient was educated that we can provide 3 monthly scripts for their medication, it is their responsibility to follow the instructions.  Side effects or complications from medications: none  Patient is aware that pain medications are meant to minimize the severity of the pain to allow their pain levels to improve to allow for better function. They are aware of that pain medications cannot totally remove their pain.  Due for UDT ( at least once per year) : yes   Patient needs something prescribed for a possible sinus infection.  Pt had tooth pulled, roots up in the sinus canal.  Three months ago.,  pressure Cataract surgery was on December 31, 2014.    Results for orders placed or performed in visit on 04/04/15  POCT glycosylated hemoglobin (Hb A1C)  Result Value Ref Range   Hemoglobin A1C 5.7         Review of Systems No current chest pain no current chest pain no abdominal pain no shortness of breath no ankle swelling ongoing back pain    Objective:   Physical Exam  Alert vitals stable. HEENT normal. Back substantial pain to percussion. Hands rheumatoid  arthritis changes ankles trace edema  Left maxillary sinus    Assessment & Plan:  Impression 1 type 2 diabetes tight control discussed #2 chronic pain discussed at length #3 subacute sinusitis plan I bites prescribed. Pain medications refilled. Other medicines refilled. Diet exercise discussed. Recheck in several months. WSL

## 2015-04-09 LAB — TOXASSURE SELECT 13 (MW), URINE: PDF: 0

## 2015-04-11 ENCOUNTER — Encounter: Payer: Self-pay | Admitting: Family Medicine

## 2015-04-11 ENCOUNTER — Ambulatory Visit (INDEPENDENT_AMBULATORY_CARE_PROVIDER_SITE_OTHER): Payer: Medicare Other | Admitting: Family Medicine

## 2015-04-11 ENCOUNTER — Other Ambulatory Visit: Payer: Self-pay | Admitting: Family Medicine

## 2015-04-11 VITALS — BP 128/86 | Temp 98.2°F | Ht 63.0 in | Wt 146.0 lb

## 2015-04-11 DIAGNOSIS — I6523 Occlusion and stenosis of bilateral carotid arteries: Secondary | ICD-10-CM

## 2015-04-11 DIAGNOSIS — R0600 Dyspnea, unspecified: Secondary | ICD-10-CM | POA: Diagnosis not present

## 2015-04-11 DIAGNOSIS — L723 Sebaceous cyst: Secondary | ICD-10-CM | POA: Diagnosis not present

## 2015-04-11 DIAGNOSIS — R600 Localized edema: Secondary | ICD-10-CM | POA: Diagnosis not present

## 2015-04-11 MED ORDER — FUROSEMIDE 20 MG PO TABS: ORAL_TABLET | ORAL | Status: DC

## 2015-04-11 NOTE — Progress Notes (Signed)
   Subjective:    Patient ID: [name], male    DOB: Aug 17, 1946, 69 y.o.   MRN: [id number]  HPIknot on left shoulder. noticied it 3 days ago. Painful when he lays on it. Pt states he cannot see it but it feels like it is draining.  He states that this area has been getting progressively larger. He denies any tenderness or pain with it.  Swelling in both ankles. Just noticied it recently.  Notice recently developing some swelling in the ankles also relates some shortness of breath when he walks does not have any shortness of breath when he sitting still denies any chest pressure  Pt is taking keflex for sinus infection.    Patient denies any chest pressure but does have a history of cardiac disease Review of Systems     Patient denies any fevers he denies wheezing he does state slight shortness of breath over the past couple weeks denies chest heaviness denies PND does relate some swelling in the ankles Objective:   Physical Exam  neck no abnormal JVD lungs crackles in the left base not extensive heart regular no gallop blood pressure good extremities 1+ edema in the lower ankles  Large sebaceous cyst noted on his back       Assessment & Plan:   sebaceous cyst with the patient being on prednisone I am concerned this could get infected in it would cause significant troubles I recommend referral to general surgery    possibility of onset of CHF we will do a chest x-ray as well as do lab work also start Lasix 20 mg in the morning he was instructed that should the swelling go down he may reduce the medication to a half a tablet and he was instructed to follow-up with Dr. Brett Canales within 2 weeks , may need echo

## 2015-04-12 ENCOUNTER — Ambulatory Visit (HOSPITAL_COMMUNITY)
Admission: RE | Admit: 2015-04-12 | Discharge: 2015-04-12 | Disposition: A | Discharge status: Home / Self Care | Payer: Medicare Other | Source: Ambulatory Visit | Attending: Family Medicine | Admitting: Family Medicine

## 2015-04-12 DIAGNOSIS — I679 Cerebrovascular disease, unspecified: Secondary | ICD-10-CM | POA: Insufficient documentation

## 2015-04-12 DIAGNOSIS — R0600 Dyspnea, unspecified: Secondary | ICD-10-CM | POA: Insufficient documentation

## 2015-04-12 DIAGNOSIS — I10 Essential (primary) hypertension: Secondary | ICD-10-CM | POA: Diagnosis not present

## 2015-04-12 DIAGNOSIS — I739 Peripheral vascular disease, unspecified: Secondary | ICD-10-CM | POA: Insufficient documentation

## 2015-04-12 DIAGNOSIS — R600 Localized edema: Secondary | ICD-10-CM | POA: Diagnosis not present

## 2015-04-12 DIAGNOSIS — E119 Type 2 diabetes mellitus without complications: Secondary | ICD-10-CM | POA: Diagnosis not present

## 2015-04-12 DIAGNOSIS — I4891 Unspecified atrial fibrillation: Secondary | ICD-10-CM | POA: Diagnosis not present

## 2015-04-12 DIAGNOSIS — K219 Gastro-esophageal reflux disease without esophagitis: Secondary | ICD-10-CM | POA: Insufficient documentation

## 2015-04-12 DIAGNOSIS — Z87891 Personal history of nicotine dependence: Secondary | ICD-10-CM | POA: Diagnosis not present

## 2015-04-12 LAB — BRAIN NATRIURETIC PEPTIDE: BNP: 755.5 pg/mL — ABNORMAL HIGH (ref 0.0–100.0)

## 2015-04-12 LAB — BASIC METABOLIC PANEL
BUN / CREAT RATIO: 17 (ref 10–22)
BUN: 22 mg/dL (ref 8–27)
CO2: 18 mmol/L (ref 18–29)
Calcium: 9.5 mg/dL (ref 8.6–10.2)
Chloride: 107 mmol/L (ref 97–108)
Creatinine, Ser: 1.31 mg/dL — ABNORMAL HIGH (ref 0.76–1.27)
GFR calc Af Amer: 64 mL/min/{1.73_m2} (ref >59)
GFR calc non Af Amer: 55 mL/min/{1.73_m2} — ABNORMAL LOW (ref >59)
Glucose: 107 mg/dL — ABNORMAL HIGH (ref 65–99)
POTASSIUM: 4.4 mmol/L (ref 3.5–5.2)
SODIUM: 144 mmol/L (ref 134–144)

## 2015-04-13 ENCOUNTER — Other Ambulatory Visit: Payer: Self-pay | Admitting: *Deleted

## 2015-04-13 DIAGNOSIS — I739 Peripheral vascular disease, unspecified: Secondary | ICD-10-CM

## 2015-04-13 DIAGNOSIS — R0600 Dyspnea, unspecified: Secondary | ICD-10-CM

## 2015-04-13 DIAGNOSIS — I509 Heart failure, unspecified: Secondary | ICD-10-CM

## 2015-04-13 NOTE — Addendum Note (Signed)
Addended by: Jeralene Peters on: 04/13/2015 09:45 AM   Modules accepted: Orders

## 2015-04-14 ENCOUNTER — Encounter: Payer: Self-pay | Admitting: Family Medicine

## 2015-04-17 ENCOUNTER — Other Ambulatory Visit (HOSPITAL_COMMUNITY): Payer: Medicare Other

## 2015-04-18 ENCOUNTER — Other Ambulatory Visit: Payer: Self-pay | Admitting: Neurosurgery

## 2015-04-18 DIAGNOSIS — M4712 Other spondylosis with myelopathy, cervical region: Secondary | ICD-10-CM

## 2015-04-20 ENCOUNTER — Ambulatory Visit (HOSPITAL_COMMUNITY)
Admission: RE | Admit: 2015-04-20 | Discharge: 2015-04-20 | Disposition: A | Discharge status: Home / Self Care | Payer: Medicare Other | Source: Ambulatory Visit | Attending: Family Medicine | Admitting: Family Medicine

## 2015-04-20 DIAGNOSIS — I739 Peripheral vascular disease, unspecified: Secondary | ICD-10-CM | POA: Diagnosis not present

## 2015-04-20 DIAGNOSIS — E785 Hyperlipidemia, unspecified: Secondary | ICD-10-CM | POA: Insufficient documentation

## 2015-04-20 DIAGNOSIS — R0600 Dyspnea, unspecified: Secondary | ICD-10-CM | POA: Diagnosis not present

## 2015-04-20 DIAGNOSIS — I509 Heart failure, unspecified: Secondary | ICD-10-CM | POA: Diagnosis not present

## 2015-04-20 NOTE — Progress Notes (Signed)
*  PRELIMINARY RESULTS* Echocardiogram 2D Echocardiogram has been performed.  [name] 04/20/2015, 11:23 AM

## 2015-04-29 ENCOUNTER — Ambulatory Visit
Admission: RE | Admit: 2015-04-29 | Discharge: 2015-04-29 | Disposition: A | Discharge status: Home / Self Care | Payer: Medicare Other | Source: Ambulatory Visit | Attending: Neurosurgery | Admitting: Neurosurgery

## 2015-04-29 DIAGNOSIS — M4712 Other spondylosis with myelopathy, cervical region: Secondary | ICD-10-CM

## 2015-05-03 ENCOUNTER — Other Ambulatory Visit: Payer: Self-pay | Admitting: Family Medicine

## 2015-05-04 ENCOUNTER — Ambulatory Visit (INDEPENDENT_AMBULATORY_CARE_PROVIDER_SITE_OTHER): Payer: Medicare Other | Admitting: Family Medicine

## 2015-05-04 ENCOUNTER — Other Ambulatory Visit: Payer: Self-pay

## 2015-05-04 ENCOUNTER — Ambulatory Visit (HOSPITAL_COMMUNITY)
Admission: RE | Admit: 2015-05-04 | Discharge: 2015-05-04 | Disposition: A | Discharge status: Home / Self Care | Payer: Medicare Other | Source: Ambulatory Visit | Attending: Family Medicine | Admitting: Family Medicine

## 2015-05-04 ENCOUNTER — Encounter: Payer: Self-pay | Admitting: Family Medicine

## 2015-05-04 VITALS — BP 120/70 | Temp 98.1°F | Ht 63.0 in | Wt 144.0 lb

## 2015-05-04 DIAGNOSIS — R05 Cough: Secondary | ICD-10-CM | POA: Diagnosis not present

## 2015-05-04 DIAGNOSIS — J8410 Pulmonary fibrosis, unspecified: Secondary | ICD-10-CM

## 2015-05-04 DIAGNOSIS — R0989 Other specified symptoms and signs involving the circulatory and respiratory systems: Secondary | ICD-10-CM | POA: Diagnosis not present

## 2015-05-04 DIAGNOSIS — J200 Acute bronchitis due to Mycoplasma pneumoniae: Secondary | ICD-10-CM

## 2015-05-04 DIAGNOSIS — I6523 Occlusion and stenosis of bilateral carotid arteries: Secondary | ICD-10-CM | POA: Diagnosis not present

## 2015-05-04 DIAGNOSIS — R059 Cough, unspecified: Secondary | ICD-10-CM

## 2015-05-04 MED ORDER — AZITHROMYCIN 250 MG PO TABS: ORAL_TABLET | ORAL | Status: DC

## 2015-05-04 NOTE — Progress Notes (Signed)
   Subjective:    Patient ID: [name], male    DOB: November 24, 1945, 69 y.o.   MRN: [id number]  Cough This is a new problem. The current episode started in the past 7 days. The problem has been gradually worsening. The problem occurs every few minutes. The cough is productive of sputum. Nothing aggravates the symptoms. He has tried OTC cough suppressant (Robitussin) for the symptoms. The treatment provided no relief.   Patient states that he has had the cough for about a week. Patient states that symptoms worsen at night. Patient has c/o cough with productive yellow sputum.Patient had chest x-ray today and recent echocardi. Patient relates he has felt more progressive shortness of breath over the past few months but over the past few days is had head congestion drainage coughing and some shortness of breath he was worried about pneumonia. He states he was once taking methotrexate was told that it caused lung damage. He denies any significant troubles in the legs denies any pains or discomfort no hemoptysis. Appetite okay. He has a history rheumatoid arthritis and lung disease and heart disease had recent echo which showed good ejection fraction although his BNP was significantly elevated he is now using the diuretic intermittently Review of Systems  Respiratory: Positive for cough.    He denies fevers denies vomiting diarrhea denies passing out denies chest pressure tightness does relate some shortness of breath when he pushes himself    Objective:   Physical Exam Neck no masses rate is normal [place] moist there are some crackles bilateral heart is regular abdomen soft extremities no edema  Echo was reviewed with the patient chest x-ray reviewed with the patient 25 minutes spent with patient reviewing over his x-rays his echo with the patient answering questions and reviewing his health and history for this particular problem greater and half spent in discussion    Assessment & Plan:  I believe  the patient has bronchitis I recommend antibiotics he is at risk of developing pneumonia What appears to be pulmonary fibrosis on x-ray I would recommend pre-and post pulmonary function albuterol when necessary Patient was counseled regarding warning signs and pneumonia I do not feel the patient has CHF. Patient may use diuretic intermittently for swelling in the legs

## 2015-05-08 ENCOUNTER — Ambulatory Visit: Payer: Medicare Other | Admitting: Family Medicine

## 2015-05-08 ENCOUNTER — Ambulatory Visit: Payer: Medicare Other | Admitting: Family

## 2015-05-08 ENCOUNTER — Other Ambulatory Visit (HOSPITAL_COMMUNITY): Payer: Medicare Other

## 2015-05-08 ENCOUNTER — Encounter (HOSPITAL_COMMUNITY): Payer: Medicare Other

## 2015-05-10 ENCOUNTER — Other Ambulatory Visit: Payer: Self-pay | Admitting: Family Medicine

## 2015-05-10 NOTE — Telephone Encounter (Signed)
Ok six mo worth 

## 2015-05-12 ENCOUNTER — Other Ambulatory Visit: Payer: Self-pay | Admitting: Family Medicine

## 2015-05-12 MED ORDER — LEFLUNOMIDE 20 MG PO TABS
40.0000 mg | ORAL_TABLET | Freq: Every day | ORAL | Status: DC
Start: 2015-05-12 — End: 2015-05-12

## 2015-05-12 NOTE — Telephone Encounter (Signed)
leflunomide (ARAVA) 20 MG tablet  Dr Lendon Colonel normally provides this med for him but she has moved  Offices, she is unable to be reached at this time. He is out of this med An needs a refill on this med today if possible.   [place]   Please call when sent (he states he would rather you write this med  For him from now on)

## 2015-05-12 NOTE — Telephone Encounter (Signed)
Pt aware, verbalized understanding that Dr Brett Canales does not typically prescribe this med And that he will need to see his Rheumatologist for this in the future.

## 2015-05-12 NOTE — Telephone Encounter (Signed)
This is only rx'ed by rheumaologist's office, i do not write for this, since in a pinch we can do thirty days worth but i do not rx this medicine, must be rx'ed long term by a rheumatologist

## 2015-05-12 NOTE — Telephone Encounter (Signed)
Left message to return call (med sent in) 

## 2015-05-19 ENCOUNTER — Ambulatory Visit (HOSPITAL_COMMUNITY)
Admission: RE | Admit: 2015-05-19 | Discharge: 2015-05-19 | Disposition: A | Discharge status: Home / Self Care | Payer: Medicare Other | Source: Ambulatory Visit | Attending: Family Medicine | Admitting: Family Medicine

## 2015-05-19 ENCOUNTER — Other Ambulatory Visit (HOSPITAL_COMMUNITY): Payer: Medicare Other | Admitting: Respiratory Therapy

## 2015-05-19 DIAGNOSIS — F17210 Nicotine dependence, cigarettes, uncomplicated: Secondary | ICD-10-CM | POA: Insufficient documentation

## 2015-05-19 DIAGNOSIS — R0600 Dyspnea, unspecified: Secondary | ICD-10-CM | POA: Insufficient documentation

## 2015-05-19 LAB — PULMONARY FUNCTION TEST
FEF 25-75 Post: 1.18 L/sec
FEF 25-75 Pre: 0.98 L/sec
FEF2575-%CHANGE-POST: 20 %
FEF2575-%PRED-POST: 51 %
FEF2575-%Pred-Pre: 42 %
FEV1-%CHANGE-POST: 3 %
FEV1-%Pred-Post: 52 %
FEV1-%Pred-Pre: 50 %
FEV1-POST: 1.56 L
FEV1-Pre: 1.51 L
FEV1FVC-%Change-Post: 3 %
FEV1FVC-%PRED-PRE: 98 %
FEV6-%Change-Post: 0 %
FEV6-%Pred-Post: 53 %
FEV6-%Pred-Pre: 53 %
FEV6-Post: 2.08 L
FEV6-Pre: 2.07 L
FEV6FVC-%PRED-POST: 106 %
FEV6FVC-%Pred-Pre: 106 %
FVC-%Change-Post: 0 %
FVC-%PRED-POST: 50 %
FVC-%PRED-PRE: 50 %
FVC-Post: 2.08 L
FVC-Pre: 2.07 L
POST FEV1/FVC RATIO: 75 %
PRE FEV1/FVC RATIO: 73 %
Post FEV6/FVC ratio: 100 %
Pre FEV6/FVC Ratio: 100 %

## 2015-05-19 MED ORDER — ALBUTEROL SULFATE (2.5 MG/3ML) 0.083% IN NEBU
2.5000 mg | INHALATION_SOLUTION | Freq: Once | RESPIRATORY_TRACT | Status: AC
Start: 2015-05-19 — End: 2015-05-19
  Administered 2015-05-19: 2.5 mg via RESPIRATORY_TRACT

## 2015-05-31 ENCOUNTER — Other Ambulatory Visit: Payer: Self-pay | Admitting: Family Medicine

## 2015-05-31 ENCOUNTER — Ambulatory Visit: Payer: Medicare Other | Admitting: Family Medicine

## 2015-06-07 ENCOUNTER — Ambulatory Visit (INDEPENDENT_AMBULATORY_CARE_PROVIDER_SITE_OTHER): Payer: Medicare Other | Admitting: Family Medicine

## 2015-06-07 ENCOUNTER — Encounter: Payer: Self-pay | Admitting: Family Medicine

## 2015-06-07 VITALS — BP 128/80 | Ht 63.0 in | Wt 142.5 lb

## 2015-06-07 DIAGNOSIS — J8410 Pulmonary fibrosis, unspecified: Secondary | ICD-10-CM

## 2015-06-07 DIAGNOSIS — I6523 Occlusion and stenosis of bilateral carotid arteries: Secondary | ICD-10-CM | POA: Diagnosis not present

## 2015-06-07 DIAGNOSIS — Z23 Encounter for immunization: Secondary | ICD-10-CM | POA: Diagnosis not present

## 2015-06-07 NOTE — Patient Instructions (Signed)
Idiopathic Pulmonary Fibrosis Idiopathic pulmonary fibrosis is an inflammation (soreness and irritation) in the lungs which eventually causes scarring. This usually shows in middle age over a several year time period. The cause is unknown (idiopathic). Usually death occurs after several years but there are no time tables which will predict perfectly what the course of the illness will be. It affects males and females equally. In this condition there is a formation of fibrous (tough leathery) tissue in the small ducts which carry air to and from your lungs. Because of this, the lungs do not work as well as they should for taking in oxygen from the air you breathe and getting rid of wastes (carbon dioxide). Because the lungs are damaged, there may be more problems with infections or the heart.  Other problems may include pulmonary hypertension (high blood pressure in the lungs), and formation of blood clots. Some symptoms of this illness are:  Coughing and breathing difficulties; cough is usually dry and hacking.  Bluish (cyanotic) skin and lips due to lack of circulating oxygen.  Loss of appetite.  Loss of strength which comes from just the increased work of breathing.  Rapid, shallow breathing occur with moderate exercise and later even while resting.  Increasing shortness of breath (dyspnea) which progresses as the disease gets worse.  Weight loss and fatigue due partly to the increased work of breathing.  Clubbing of the fingers (the ends of the fingers become rounded and enlarged). DIAGNOSIS  A diagnosis of idiopathic pulmonary fibrosis may be suspected based on exam and a patient's history.  Specialized X-rays, pulmonary function tests, pulse oximetry, and laboratory tests including blood gasses help confirm the problem.  Sometimes a biopsy is done and a small piece of lung tissue is removed. This may be done through a bronchoscope or during an operation in which the chest is opened. This  is looked at under a microscope by a specialist who can tell what the lung problem is. CAUSES If the cause of pulmonary fibrosis is known, it is no longer known as idiopathic. Several causes of pulmonary fibrosis include:  Occupational and environmental exposures to asbestos, silica, and or metal dusts.  Illegal or street drug use.  Agricultural workers may inhale substances, such as moldy hay, which can cause an allergic reaction in the lung. This reaction is called Farmer's Lung and can cause pulmonary fibrosis. Some other fumes found on farms are directly toxic to the lungs.  Exposure of the lungs to radiation.  Collagen diseases.  Sarcoidosis is a disease which forms granulomas (areas of inflammatory cells), which can attack any area of the body but most frequently affects the lungs.  Drugs. Certain medicines may have the undesirable side effect of causing pulmonary fibrosis. Check with your doctor about the medicines you are taking and ask about any possible side effects.  Some cases of pulmonary fibrosis seem to be genetic. TREATMENT   There are no drugs currently approved for the treatment of pulmonary fibrosis. Steroids (a potent medication which cuts down on inflammation) are sometimes given to prevent lung changes before they become permanent. High doses may be recommended at first, followed by lower maintenance dosages. Other medications may be tried if steroids do not work.  Lung disease may be monitored with X-rays and laboratory work.  Oxygen may be helpful if oxygen in the blood is diminished. This improves the quality of life. Your caregiver will give you a prescription for this if it is helpful.  Antibiotics are used   for treatment of infections.  Exercise may be beneficial.  Lung transplants are being investigated and a single lung transplant may be considered for some patients.  Influenza vaccine and pneumococcal pneumonia vaccine are both recommended for people  with IPF or any lung disease. These two shots may help keep you healthy. Document Released: 11/30/2003 Document Revised: 12/02/2011 Document Reviewed: 09/09/2005 ExitCare Patient Information 2015 [employer]. This information is not intended to replace advice given to you by your health care provider. Make sure you discuss any questions you have with your health care provider.  

## 2015-06-07 NOTE — Progress Notes (Signed)
   Subjective:    Patient ID: [name], male    DOB: [birth year], 69 y.o.   MRN: [id number]  HPI Patient is in today for a follow up on knot to left shoulder. Patient states knot has opened up and is currently being dressed.   Patient would also like to discuss recent Diagnoses of COPD and Rheumatoid arthritis. Patient requests a complete review of all his tests. Next  He is concerned by progressive shortness of breath. Notes dyspnea with virtually any type of exertion. Next  Gives a long-standing history of shortness of breath and wheezing. In part he relates it to the methotrexate he received for rheumatoid toyed arthritis many years ago.  Report substantial difficulties doing when he wants to do now with his breathing.  Patient states no other concerns at this time.  Review of Systems No headache no chest pain chronic joint pain no abdominal pain no nausea no diaphoresis    Objective:   Physical Exam Alert vitals stable no acute distress moderate malaise lungs crackles in the base fine in nature. Heart regular in rhythm abdomen soft ankles no significant edema Back sebaceous cyst clinically improved patient packing      Assessment & Plan:  Impression progressive dyspnea with x-ray changes show substantial interstitial type changes discussed at length. Consistent with restrictive nature pulmonary function tests. Potential etiology could be in part methotrexate in part smoking in part due to rheumatoid arthritis. Difficult to say. #2 skin lesion improving continue same therapy plan pulmonary referral rationale discussed. Warning signs discussed diet exercise discussed maintain same meds. Easily 25 minutes spent most in discussion of nature of disease and need for further assessment abuse

## 2015-06-09 ENCOUNTER — Telehealth: Payer: Self-pay | Admitting: Family Medicine

## 2015-06-09 ENCOUNTER — Other Ambulatory Visit: Payer: Self-pay | Admitting: Family Medicine

## 2015-06-09 NOTE — Telephone Encounter (Signed)
Pt called to check status of pulmonary referral There is not one in referral to do list and OV note from 06/07/15 is not complete  Please advise  Pt Home# [phone], [facility]# [phone]

## 2015-06-11 DIAGNOSIS — J8410 Pulmonary fibrosis, unspecified: Secondary | ICD-10-CM | POA: Insufficient documentation

## 2015-06-12 ENCOUNTER — Encounter: Payer: Self-pay | Admitting: Family Medicine

## 2015-06-22 ENCOUNTER — Telehealth: Payer: Self-pay | Admitting: Internal Medicine

## 2015-06-22 ENCOUNTER — Ambulatory Visit (INDEPENDENT_AMBULATORY_CARE_PROVIDER_SITE_OTHER): Payer: Medicare Other | Admitting: Internal Medicine

## 2015-06-22 ENCOUNTER — Encounter: Payer: Self-pay | Admitting: Internal Medicine

## 2015-06-22 VITALS — BP 138/74 | HR 62 | Ht 68.0 in | Wt 143.4 lb

## 2015-06-22 DIAGNOSIS — R0600 Dyspnea, unspecified: Secondary | ICD-10-CM | POA: Insufficient documentation

## 2015-06-22 DIAGNOSIS — Z8679 Personal history of other diseases of the circulatory system: Secondary | ICD-10-CM | POA: Diagnosis not present

## 2015-06-22 DIAGNOSIS — J849 Interstitial pulmonary disease, unspecified: Secondary | ICD-10-CM

## 2015-06-22 DIAGNOSIS — R05 Cough: Secondary | ICD-10-CM

## 2015-06-22 DIAGNOSIS — I6523 Occlusion and stenosis of bilateral carotid arteries: Secondary | ICD-10-CM | POA: Diagnosis not present

## 2015-06-22 DIAGNOSIS — R053 Chronic cough: Secondary | ICD-10-CM | POA: Insufficient documentation

## 2015-06-22 MED ORDER — HYDROCODONE-HOMATROPINE 5-1.5 MG/5ML PO SYRP: 5.0000 mL | ORAL_SOLUTION | Freq: Every evening | ORAL | Status: DC | PRN

## 2015-06-22 NOTE — Telephone Encounter (Signed)
Received message from Mr [facility] pulmonologist Dr Marchelle Gearing. Worsening SOB in setting of rheumatoid arthritis. Mild to moderately elevated PASP by echo 03/2015. Pulm has requested a RHC which I think is appropriate given the patient's symptoms and risk for pulmonary hypertension. We will have this arranged.   Dominga Ferry MD

## 2015-06-22 NOTE — Telephone Encounter (Signed)
Dear Dr. branch deeply set up a right heart catheterization for [name] as soon as possible. If you have a cardiologist in mind I could also make arrangements for those. I'm concerned about pulmonary hypertension in the setting of rheumatoid arthritis  Thank you  Dr. Kalman Shan, M.D., [facility].C.P Pulmonary and Critical Care Medicine Staff Physician [facility] [place] [facility] Pager: [phone], If no answer or between  15:00h - 7:00h: call [phone]  06/22/2015 9:41 AM

## 2015-06-22 NOTE — Telephone Encounter (Signed)
Dr. Wyline Mood: Thank you very much  Robynn Pane: I  like to see Mr. Stroder back after the right heart cath provided it is within 4 weeks. If not just bring him back after the CT chest investigation  No need for any one to reply closing message

## 2015-06-22 NOTE — Progress Notes (Signed)
Subjective:    Patient ID: [name], male    DOB: 05/30/1946, 69 y.o.   MRN: [id number]   PCP Lubertha South, MD  Rheumatologist formally Dr. Zenovia Jordan and currently? Dr. Kathi Ludwig at [facility]  HPI  OV 06/22/2015  Chief Complaint  Patient presents with  . pulmonary consult    Referred by Dr. Gerda Diss for pulmonary fibrosis. Pt states that he is SOB that increases with exertion. Pt states that he does cough, mostly dry, occasional yellow mucus. Pt states that he does wheeze.    69 year old male with long-standing history of rheumatoid arthritis. At baseline a year ago he did not have dyspnea and could walk up with his garage and work with the tools but did limited by pain in his back and his arthritis. Then starting 6 months ago he started having insidious onset of shortness of breath that has become progressive. Currently it is moderate in intensity. Brought on by exertion. Relieved by rest. Notices with exertion for even walking a wheelchair ramp of few to several feet. He still able to do his ADLs but gets limited by shortness of breath which is more than pain. This no  chest pain or edema or hemoptysis or weight loss. However has bad cough esp at night - wants relief  He underwent spirometry which shows FVC 50% recently. I personally visualized the results and the image tracing from 04/30/2015. There is no DLCO.  CT images back in 2009 and 2012 that I personally visualized the show early evidence of ILD and those. However he is not aware of this diagnosis  Echocardiogram end of July 2016 does show elevated pulmonary artery systolic pressure height 36 mmHg   Walking desaturation test today 06/22/1999 1685 feet 3 laps on room air: He was only able to walk half lap. Resting pulse ox was 93% and resting heart rate was 69. At the end of half lap he stopped due to dyspnea but pulse ox remained at 94% and heart rate at 92/48min  ILD relevant history  - from [facility] ILD  question -= Symptoms are severe level V out of 5 dyspnea for the last 6 months. Along with cough at night. Dyspnea is been progressive for the last 3 months = This history of smoking cigarettes from age 45, 20 cigarettes a day and quit at age 33- - denies exposure to humidifier, sound, hot tub, birds but reports positive exposure to mold and water damage and animals - Long-standing rheumatoid arthritis. He used to be on methotrexate in the early 2000. He is not on TNF alpha blockers because of fear of pneumonia. He is on a rib on prednisone for the last 7 years - Strong history of acid reflux documented below = He woke in the textile industry for 38 years but denies exposure to dust or nylon There is some fertilizer exposure and would exposure - In terms of toxic drugs he is on prednisone. In the past used to be on methotrexate. Currently on Arava   has a past medical history of Arteriosclerotic cardiovascular disease (ASCVD); Hyperlipidemia; Hypertension; PVD (peripheral vascular disease); Cerebrovascular disease; Tobacco abuse, in remission; GERD (gastroesophageal reflux disease); Hypothyroid; Allergic rhinitis; Chronic lung disease; Schatzki's ring; Clostridium difficile infection; Achalasia; Diabetes mellitus; Atrial fibrillation; Chronic back pain; Rheumatoid arthritis(714.0); Cervical spondylosis; DDD (degenerative disc disease), lumbar; Pneumonia; Carotid artery occlusion; Nephrolithiasis (2010); and Hiatal hernia.   reports that he quit smoking about 49 years ago. His smoking use included Cigarettes. He  started smoking about 58 years ago. He has a 2.5 pack-year smoking history. He has quit using smokeless tobacco.  Past Surgical History  Procedure Laterality Date  . Coronary artery bypass graft      x6 In 1998  . Total knee arthroplasty      Right  . Ankle fusion      Left  . Wrist fusion      Right  . Shoulder surgery    . Colonoscopy  09/12/2011    Dr. Elly Modena  hemorrhoids, normal colon and distal terminal ileum. Random bx negative. Stool for CDiff positive.  . Esophagogastroduodenoscopy  09/13/2011    Dr. Lyndle Herrlich plawues mid-esophagus KOH negative. Distal esophageal ring and ulcer. Mild gastritis. duodenal diverticulum. Savaory dilation 72mm.   Gaspar Bidding dilation  09/13/2011  . Esophageal manometry  09/30/2011    Procedure: ESOPHAGEAL MANOMETRY (EM);  Surgeon: Rob Bunting, MD;  Location: [facility] END[facility]COPY;  Service: Endoscopy;  Laterality: N/A;  . Esophagogastroduodenoscopy  09/12/2011    Dr. Rinaldo Ratel rings s/p dilation  . Cardiac surgery    . Esophagomyotomy  11/12/11    [facility]- with DOR antireflux surgery  . Esophagogastroduodenoscopy  06/25/2012    Procedure: ESOPHAGOGASTRODUODENOSCOPY (EGD);  Surgeon: Corbin Ade, MD;  Location: [facility] ENDO SUITE;  Service: Endoscopy;  Laterality: N/A;  7:30  . Savory dilation  06/25/2012    Procedure: SAVORY DILATION;  Surgeon: Corbin Ade, MD;  Location: [facility];  Service: Endoscopy;  Laterality: N/A;  Elease Hashimoto dilation  06/25/2012    Procedure: Elease Hashimoto DILATION;  Surgeon: Corbin Ade, MD;  Location: [facility];  Service: Endoscopy;  Laterality: N/A;  . Ankle fusion  09/01/2012    Procedure: ANKLE FUSION;  Surgeon: Valeria Batman, MD;  Location: [facility] OR;  Service: Orthopedics;  Laterality: Left;  Take down of angulated tibial/fibula Fractures  . Femoral-tibial bypass graft Left 01/28/2013    Procedure: BYPASS GRAFT FEMORAL-TIBIAL ARTERY;  Surgeon: Nada Libman, MD;  Location: [facility] OR;  Service: Vascular;  Laterality: Left;  Ultrasound Guided  . Amputation Left 01/28/2013    Procedure: AMPUTATION LEFT 5TH TOE;  Surgeon: Nada Libman, MD;  Location: [facility] OR;  Service: Vascular;  Laterality: Left;  . Spine surgery  11-03-13  . Abdominal aortagram N/A 01/01/2013    Procedure: ABDOMINAL Ronny Flurry;  Surgeon: Sherren Kerns, MD;  Location: [facility] CATH LAB;  Service: Cardiovascular;  Laterality: N/A;    . Abdominal aortagram N/A 10/05/2013    Procedure: ABDOMINAL AORTAGRAM;  Surgeon: Nada Libman, MD;  Location: [facility] CATH LAB;  Service: Cardiovascular;  Laterality: N/A;  . Lower extremity angiogram Left 10/05/2013    Procedure: LOWER EXTREMITY ANGIOGRAM;  Surgeon: Nada Libman, MD;  Location: [facility] CATH LAB;  Service: Cardiovascular;  Laterality: Left;  . Abdominal aortagram N/A 11/01/2014    Procedure: ABDOMINAL Ronny Flurry;  Surgeon: Nada Libman, MD;  Location: [facility] CATH LAB;  Service: Cardiovascular;  Laterality: N/A;  . Joint replacement Right 1999    Knee  . Toe amputation Left 09/05/2014    Removed left fifth toe  . Cataract extraction w/phaco Left 12/22/2014    Procedure: CATARACT EXTRACTION PHACO AND INTRAOCULAR LENS PLACEMENT (IOC);  Surgeon: Gemma Payor, MD;  Location: [facility] ORS;  Service: Ophthalmology;  Laterality: Left;  CDE:11.89  . Cataract extraction w/phaco Right 01/30/2015    Procedure: CATARACT EXTRACTION PHACO AND INTRAOCULAR LENS PLACEMENT RIGHT EYE;  Surgeon: Gemma Payor, MD;  Location: [facility] ORS;  Service: Ophthalmology;  Laterality: Right;  CDE:10.56  . Eye surgery Left December 22, 2014    Cataract  . Eye surgery Right December 31, 2014    Cataract    Allergies  Allergen Reactions  . Clindamycin/Lincomycin Other (See Comments)    Kidney Failure  . Amoxicillin Hives and Other (See Comments)    Hallucinations   . Ciprofloxacin Other (See Comments)    C-diff  . Other Hives, Diarrhea and Other (See Comments)    "Mycin" Causes C-diff  . Doxycycline Nausea And Vomiting  . Levofloxacin Nausea And Vomiting    Immunization History  Administered Date(s) Administered  . Influenza,inj,Quad PF,36+ Mos 07/04/2014, 06/07/2015  . Influenza-Unspecified 06/17/2013  . Pneumococcal Conjugate-13 07/04/2014    Family History  Problem Relation Age of Onset  . Stomach cancer Mother 64  . Cancer Mother     Stomach  . Heart disease Mother   . Heart attack Mother   . Heart attack Father    . Heart disease Father   . Heart disease Brother   . Leukemia Brother   . Lung disease Son     infant  . Lung disease Daughter     infant     Current outpatient prescriptions:  .  ALPRAZolam (XANAX) 1 MG tablet, TAKE 1 TABLET BY MOUTH ONCE DAILY AS NEEDED FOR ANXIETY., Disp: 30 tablet, Rfl: 5 .  aspirin EC 81 MG tablet, Take 81 mg by mouth daily.  , Disp: , Rfl:  .  atenolol (TENORMIN) 25 MG tablet, TAKE (1) TABLET BY MOUTH TWICE DAILY., Disp: 60 tablet, Rfl: 5 .  atorvastatin (LIPITOR) 80 MG tablet, Take 1 tablet (80 mg total) by mouth daily., Disp: 30 tablet, Rfl: 11 .  finasteride (PROSCAR) 5 MG tablet, Take 5 mg by mouth daily., Disp: , Rfl:  .  folic acid (FOLVITE) 1 MG tablet, Take 1 mg by mouth daily., Disp: , Rfl:  .  furosemide (LASIX) 20 MG tablet, 1/2 to 1 qam for swelling, Disp: 30 tablet, Rfl: 1 .  HYDROcodone-acetaminophen (NORCO) 10-325 MG per tablet, Take 1 tablet up to five times daily, Disp: 150 tablet, Rfl: 0 .  ibuprofen (ADVIL,MOTRIN) 800 MG tablet, TAKE ONE TABLET BY MOUTH THREE TIMES DAILY., Disp: 270 tablet, Rfl: 3 .  leflunomide (ARAVA) 20 MG tablet, TAKE 2 TABLETS BY MOUTH DAILY, Disp: 180 tablet, Rfl: 0 .  levothyroxine (SYNTHROID, LEVOTHROID) 88 MCG tablet, TAKE ONE TABLET BY MOUTH ONCE DAILY., Disp: 30 tablet, Rfl: 2 .  lisinopril (PRINIVIL,ZESTRIL) 20 MG tablet, TAKE ONE TABLET BY MOUTH DAILY., Disp: 30 tablet, Rfl: 5 .  Melatonin 3 MG TABS, Take 3-6 mg by mouth at bedtime as needed. , Disp: , Rfl:  .  Multiple Vitamin (MULTIVITAMIN) capsule, Take 1 capsule by mouth daily., Disp: , Rfl:  .  omeprazole (PRILOSEC OTC) 20 MG tablet, Take 20 mg by mouth 2 (two) times daily., Disp: , Rfl:  .  OVER THE COUNTER MEDICATION, Take 1 capsule by mouth daily. "Florajen 3 Probiotic", Disp: , Rfl:  .  predniSONE (DELTASONE) 10 MG tablet, TAKE ONE TABLET BY MOUTH ONCE DAILY., Disp: 30 tablet, Rfl: 11 .  tamsulosin (FLOMAX) 0.4 MG CAPS capsule, Take 0.4 mg by mouth daily.,  Disp: , Rfl:  .  tiZANidine (ZANAFLEX) 4 MG tablet, TAKE 1 TABLET BY MOUTH THREE TIMES DAILY AS NEEDED FOR MUSCLE SPASMS., Disp: 30 tablet, Rfl: 11 .  Vitamin D, Ergocalciferol, (DRISDOL) 50000 UNITS CAPS capsule, Take 50,000 Units by mouth every 7 (seven)  days. On Monday., Disp: , Rfl:  .  albuterol (PROVENTIL HFA;VENTOLIN HFA) 108 (90 BASE) MCG/ACT inhaler, Inhale 2 puffs into the lungs every 4 (four) hours as needed for wheezing or shortness of breath. (Patient not taking: Reported on 06/22/2015), Disp: 1 Inhaler, Rfl: 2    Review of Systems  Constitutional: Negative for fever and unexpected weight change.  HENT: Positive for congestion, dental problem, rhinorrhea and sinus pressure. Negative for ear pain, nosebleeds, postnasal drip, sneezing, sore throat and trouble swallowing.   Eyes: Negative for redness and itching.  Respiratory: Positive for cough, chest tightness, shortness of breath and wheezing.   Cardiovascular: Positive for leg swelling. Negative for palpitations.  Gastrointestinal: Positive for nausea and vomiting.  Genitourinary: Negative for dysuria.  Musculoskeletal: Negative for joint swelling.  Skin: Negative for rash.  Neurological: Negative for headaches.  Hematological: Does not bruise/bleed easily.  Psychiatric/Behavioral: Negative for dysphoric mood. The patient is not nervous/anxious.        Objective:   Physical Exam  Constitutional: He is oriented to person, place, and time. He appears well-nourished. No distress.  Deconditioned debilitated male  HENT:  Head: Normocephalic and atraumatic.  Right Ear: External ear normal.  Left Ear: External ear normal.  Mouth/Throat: Oropharynx is clear and moist. No oropharyngeal exudate.  Eyes: Conjunctivae and EOM are normal. Pupils are equal, round, and reactive to light. Right eye exhibits no discharge. Left eye exhibits no discharge. No scleral icterus.  Neck: Normal range of motion. Neck supple. No JVD present. No  tracheal deviation present. No thyromegaly present.  Cardiovascular: Normal rate, regular rhythm and intact distal pulses.  Exam reveals no gallop and no friction rub.   No murmur heard. Pulmonary/Chest: Effort normal. No respiratory distress. He has no wheezes. He has rales. He exhibits no tenderness.  Bilateral extensive crackles  Abdominal: Soft. Bowel sounds are normal. He exhibits no distension and no mass. There is no tenderness. There is no rebound and no guarding.  Musculoskeletal: Normal range of motion. He exhibits no edema or tenderness.  Sitting in wheel chair with severe deformities of rheumatoid arthritis  Lymphadenopathy:    He has no cervical adenopathy.  Neurological: He is alert and oriented to person, place, and time. He has normal reflexes. No cranial nerve deficit. Coordination normal.  Skin: Skin is warm and dry. No rash noted. He is not diaphoretic. No erythema. No pallor.  Psychiatric: He has a normal mood and affect. His behavior is normal. Judgment and thought content normal.  Nursing note and vitals reviewed.   Filed Vitals:   06/22/15 0913  BP: 138/74  Pulse: 62  Height: 5\' 8"  (1.727 m)  Weight: 65.046 kg (143 lb 6.4 oz)  SpO2: 96%         Assessment & Plan:     ICD-9-CM ICD-10-CM   1. Dyspnea 786.09 R06.00   2. ILD (interstitial lung disease) 515 J84.9   3. Personal history of pulmonary hypertension V12.59 Z86.79    Concern new onset and worsening dyspnea is worsening interstitial lung disease and/or pulmonary hypertension as evidenced with pulmonary artery systolic pressure of 39 onon July 2016. He needs evaluation for both. We'll get walking desaturation test to the extent possible, overnight oxygen test on room air, high-resolution CT scan of the chest and will request his cardiologist Dr. branch to set up a right heart catheterization. We will see him after these tests   Severe night cough: likely relatd to ILD. Do hyocadan 78mL qhs prn. Will need  to stop lisinopril. Will inform cards. AT fu will assess GERD control   Dr. Kalman Shan, M.D., [facility].C.P Pulmonary and Critical Care Medicine Staff Physician [facility] [place] [facility]monary and Critical Care Pager: [phone], If no answer or between  15:00h - 7:00h: call [phone]  06/22/2015 9:41 AM

## 2015-06-22 NOTE — Patient Instructions (Addendum)
ICD-9-CM ICD-10-CM   1. Dyspnea 786.09 R06.00   2. ILD (interstitial lung disease) 515 J84.9   3. Personal history of pulmonary hypertension V12.59 Z86.79   4. Chronic cough 786.2 R05    Do walk test for o2 Do ONO test for o2 Do HRCT  Hycoadan 74mL qhs prn Do Right heart cath - will message DR Wyline Mood to set this up  followup 3-4 weeks after completion of o2 testing and CT

## 2015-06-23 NOTE — Telephone Encounter (Signed)
Pt has OV with MR on 10.26.16. Nothing further needed at this time.

## 2015-06-24 ENCOUNTER — Inpatient Hospital Stay (HOSPITAL_COMMUNITY)
Admission: EM | Admit: 2015-06-24 | Discharge: 2015-06-26 | DRG: 065 | Disposition: A | Discharge status: Home Health | Payer: Medicare Other | Attending: Internal Medicine | Admitting: Internal Medicine

## 2015-06-24 ENCOUNTER — Encounter (HOSPITAL_COMMUNITY): Payer: Self-pay | Admitting: Emergency Medicine

## 2015-06-24 ENCOUNTER — Emergency Department (HOSPITAL_COMMUNITY): Payer: Medicare Other

## 2015-06-24 DIAGNOSIS — G8191 Hemiplegia, unspecified affecting right dominant side: Secondary | ICD-10-CM | POA: Diagnosis present

## 2015-06-24 DIAGNOSIS — Z96651 Presence of right artificial knee joint: Secondary | ICD-10-CM | POA: Diagnosis present

## 2015-06-24 DIAGNOSIS — F17201 Nicotine dependence, unspecified, in remission: Secondary | ICD-10-CM

## 2015-06-24 DIAGNOSIS — Z7952 Long term (current) use of systemic steroids: Secondary | ICD-10-CM

## 2015-06-24 DIAGNOSIS — I4891 Unspecified atrial fibrillation: Secondary | ICD-10-CM | POA: Diagnosis present

## 2015-06-24 DIAGNOSIS — I739 Peripheral vascular disease, unspecified: Secondary | ICD-10-CM | POA: Diagnosis present

## 2015-06-24 DIAGNOSIS — Z87891 Personal history of nicotine dependence: Secondary | ICD-10-CM | POA: Diagnosis not present

## 2015-06-24 DIAGNOSIS — Z800 Family history of malignant neoplasm of digestive organs: Secondary | ICD-10-CM

## 2015-06-24 DIAGNOSIS — I10 Essential (primary) hypertension: Secondary | ICD-10-CM | POA: Diagnosis present

## 2015-06-24 DIAGNOSIS — E119 Type 2 diabetes mellitus without complications: Secondary | ICD-10-CM | POA: Diagnosis present

## 2015-06-24 DIAGNOSIS — G459 Transient cerebral ischemic attack, unspecified: Secondary | ICD-10-CM | POA: Diagnosis present

## 2015-06-24 DIAGNOSIS — Z806 Family history of leukemia: Secondary | ICD-10-CM | POA: Diagnosis not present

## 2015-06-24 DIAGNOSIS — Z8249 Family history of ischemic heart disease and other diseases of the circulatory system: Secondary | ICD-10-CM | POA: Diagnosis not present

## 2015-06-24 DIAGNOSIS — I63319 Cerebral infarction due to thrombosis of unspecified middle cerebral artery: Secondary | ICD-10-CM | POA: Diagnosis present

## 2015-06-24 DIAGNOSIS — M069 Rheumatoid arthritis, unspecified: Secondary | ICD-10-CM | POA: Diagnosis present

## 2015-06-24 DIAGNOSIS — I272 Other secondary pulmonary hypertension: Secondary | ICD-10-CM | POA: Diagnosis present

## 2015-06-24 DIAGNOSIS — E038 Other specified hypothyroidism: Secondary | ICD-10-CM

## 2015-06-24 DIAGNOSIS — J849 Interstitial pulmonary disease, unspecified: Secondary | ICD-10-CM | POA: Diagnosis present

## 2015-06-24 DIAGNOSIS — I6789 Other cerebrovascular disease: Secondary | ICD-10-CM | POA: Diagnosis not present

## 2015-06-24 DIAGNOSIS — E039 Hypothyroidism, unspecified: Secondary | ICD-10-CM | POA: Diagnosis present

## 2015-06-24 DIAGNOSIS — R531 Weakness: Secondary | ICD-10-CM | POA: Diagnosis present

## 2015-06-24 DIAGNOSIS — K219 Gastro-esophageal reflux disease without esophagitis: Secondary | ICD-10-CM | POA: Diagnosis present

## 2015-06-24 DIAGNOSIS — Z7902 Long term (current) use of antithrombotics/antiplatelets: Secondary | ICD-10-CM

## 2015-06-24 DIAGNOSIS — Z951 Presence of aortocoronary bypass graft: Secondary | ICD-10-CM

## 2015-06-24 DIAGNOSIS — I639 Cerebral infarction, unspecified: Secondary | ICD-10-CM

## 2015-06-24 DIAGNOSIS — E785 Hyperlipidemia, unspecified: Secondary | ICD-10-CM | POA: Diagnosis present

## 2015-06-24 LAB — COMPREHENSIVE METABOLIC PANEL
ALT: 21 U/L (ref 17–63)
AST: 27 U/L (ref 15–41)
Albumin: 3.2 g/dL — ABNORMAL LOW (ref 3.5–5.0)
Alkaline Phosphatase: 84 U/L (ref 38–126)
Anion gap: 7 (ref 5–15)
BILIRUBIN TOTAL: 0.6 mg/dL (ref 0.3–1.2)
BUN: 20 mg/dL (ref 6–20)
CHLORIDE: 118 mmol/L — AB (ref 101–111)
CO2: 18 mmol/L — ABNORMAL LOW (ref 22–32)
Calcium: 7 mg/dL — ABNORMAL LOW (ref 8.9–10.3)
Creatinine, Ser: 1.46 mg/dL — ABNORMAL HIGH (ref 0.61–1.24)
GFR, EST AFRICAN AMERICAN: 54 mL/min — AB (ref >60)
GFR, EST NON AFRICAN AMERICAN: 47 mL/min — AB (ref >60)
Glucose, Bld: 88 mg/dL (ref 65–99)
POTASSIUM: 3.5 mmol/L (ref 3.5–5.1)
Sodium: 143 mmol/L (ref 135–145)
TOTAL PROTEIN: 6.5 g/dL (ref 6.5–8.1)

## 2015-06-24 LAB — CBC WITH DIFFERENTIAL/PLATELET
Basophils Absolute: 0.1 10*3/uL (ref 0.0–0.1)
Basophils Relative: 0 %
EOS PCT: 2 %
Eosinophils Absolute: 0.3 10*3/uL (ref 0.0–0.7)
HEMATOCRIT: 32.7 % — AB (ref 39.0–52.0)
Hemoglobin: 10.2 g/dL — ABNORMAL LOW (ref 13.0–17.0)
LYMPHS ABS: 1.9 10*3/uL (ref 0.7–4.0)
LYMPHS PCT: 15 %
MCH: 29.6 pg (ref 26.0–34.0)
MCHC: 31.2 g/dL (ref 30.0–36.0)
MCV: 94.8 fL (ref 78.0–100.0)
MONO ABS: 0.8 10*3/uL (ref 0.1–1.0)
MONOS PCT: 7 %
NEUTROS ABS: 9.5 10*3/uL — AB (ref 1.7–7.7)
Neutrophils Relative %: 76 %
PLATELETS: 167 10*3/uL (ref 150–400)
RBC: 3.45 MIL/uL — ABNORMAL LOW (ref 4.22–5.81)
RDW: 18.1 % — AB (ref 11.5–15.5)
WBC: 12.6 10*3/uL — ABNORMAL HIGH (ref 4.0–10.5)

## 2015-06-24 MED ORDER — PREDNISONE 10 MG PO TABS
10.0000 mg | ORAL_TABLET | Freq: Every day | ORAL | Status: DC
  Administered 2015-06-24 – 2015-06-26 (×3): 10 mg via ORAL
  Filled 2015-06-24 (×3): qty 1

## 2015-06-24 MED ORDER — TAMSULOSIN HCL 0.4 MG PO CAPS
0.4000 mg | ORAL_CAPSULE | Freq: Every day | ORAL | Status: DC
  Administered 2015-06-24 – 2015-06-26 (×3): 0.4 mg via ORAL
  Filled 2015-06-24 (×3): qty 1

## 2015-06-24 MED ORDER — ALPRAZOLAM 1 MG PO TABS
1.0000 mg | ORAL_TABLET | Freq: Three times a day (TID) | ORAL | Status: DC | PRN
  Administered 2015-06-24 – 2015-06-25 (×2): 1 mg via ORAL
  Filled 2015-06-24 (×2): qty 1

## 2015-06-24 MED ORDER — STROKE: EARLY STAGES OF RECOVERY BOOK
Freq: Once | Status: AC
Start: 2015-06-24 — End: 2015-06-24
  Administered 2015-06-24: 13:00:00
  Filled 2015-06-24: qty 1

## 2015-06-24 MED ORDER — CLOPIDOGREL BISULFATE 75 MG PO TABS
75.0000 mg | ORAL_TABLET | Freq: Every day | ORAL | Status: DC
  Administered 2015-06-24 – 2015-06-26 (×3): 75 mg via ORAL
  Filled 2015-06-24 (×3): qty 1

## 2015-06-24 MED ORDER — ATORVASTATIN CALCIUM 40 MG PO TABS
80.0000 mg | ORAL_TABLET | Freq: Every day | ORAL | Status: DC
  Administered 2015-06-24 – 2015-06-26 (×3): 80 mg via ORAL
  Filled 2015-06-24 (×3): qty 2

## 2015-06-24 MED ORDER — LEFLUNOMIDE 20 MG PO TABS
40.0000 mg | ORAL_TABLET | Freq: Every day | ORAL | Status: DC
  Administered 2015-06-24 – 2015-06-26 (×3): 40 mg via ORAL
  Filled 2015-06-24 (×5): qty 2

## 2015-06-24 MED ORDER — LEVOTHYROXINE SODIUM 88 MCG PO TABS
88.0000 ug | ORAL_TABLET | Freq: Every day | ORAL | Status: DC
  Administered 2015-06-25 – 2015-06-26 (×2): 88 ug via ORAL
  Filled 2015-06-24 (×2): qty 1

## 2015-06-24 MED ORDER — HEPARIN SODIUM (PORCINE) 5000 UNIT/ML IJ SOLN
5000.0000 [IU] | Freq: Three times a day (TID) | INTRAMUSCULAR | Status: DC
  Administered 2015-06-24 – 2015-06-26 (×7): 5000 [IU] via SUBCUTANEOUS
  Filled 2015-06-24 (×7): qty 1

## 2015-06-24 MED ORDER — SENNOSIDES-DOCUSATE SODIUM 8.6-50 MG PO TABS: 1.0000 | ORAL_TABLET | Freq: Every evening | ORAL | Status: DC | PRN

## 2015-06-24 MED ORDER — HYDROCODONE-ACETAMINOPHEN 10-325 MG PO TABS
1.0000 | ORAL_TABLET | ORAL | Status: DC | PRN
  Administered 2015-06-24 – 2015-06-26 (×9): 1 via ORAL
  Filled 2015-06-24 (×9): qty 1

## 2015-06-24 MED ORDER — ATENOLOL 25 MG PO TABS
25.0000 mg | ORAL_TABLET | Freq: Two times a day (BID) | ORAL | Status: DC
  Administered 2015-06-24 – 2015-06-26 (×5): 25 mg via ORAL
  Filled 2015-06-24 (×5): qty 1

## 2015-06-24 MED ORDER — FINASTERIDE 5 MG PO TABS
5.0000 mg | ORAL_TABLET | Freq: Every day | ORAL | Status: DC
  Administered 2015-06-24 – 2015-06-26 (×3): 5 mg via ORAL
  Filled 2015-06-24 (×5): qty 1

## 2015-06-24 MED ORDER — VITAMIN D (ERGOCALCIFEROL) 1.25 MG (50000 UNIT) PO CAPS
50000.0000 [IU] | ORAL_CAPSULE | ORAL | Status: DC
Start: 2015-06-26 — End: 2015-06-26
  Administered 2015-06-26: 50000 [IU] via ORAL
  Filled 2015-06-24: qty 1

## 2015-06-24 NOTE — ED Notes (Signed)
MD at bedside.  MD removed pt off monitor.

## 2015-06-24 NOTE — ED Notes (Signed)
Pt says fell this am,small skin tear to right forearm.  Denies any pain at this time.

## 2015-06-24 NOTE — H&P (Signed)
[facility] History and Physical  [name] EAV:[id number] DOB: 01/03/1946    PCP:   Lubertha South, MD   Chief Complaint: right sided weakness since yesterday evening.   HPI:  [name] is an 69 y.o. male with hx of HTN on ACE-I, RA on arava and chronic steroid (  for over 10 years), BPH, chronic back pain, hypothyroidism, presented to the ER after at least 13 hours since ictus of right sided weakness with no facial droop or aphasia.  He was having intermittent symptoms, as yesterday, he was not able to move his right side.  He had no slurred speech or neck pain.  His symptoms have improved.  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  Cervical CT showed multilevel arthrospathy. His serology showed Cr of 1.4 and Hb of 10 g per dL.  His EKG showed NSR.  He has been taking ASA 81 mg faithfully.     Rewiew of Systems:  Constitutional: Negative for malaise, fever and chills. No significant weight loss or weight gain Eyes: Negative for eye pain, redness and discharge, diplopia, visual changes, or flashes of light. ENMT: Negative for ear pain, hoarseness, nasal congestion, sinus pressure and sore throat. No headaches; tinnitus, drooling, or problem swallowing. Cardiovascular: Negative for chest pain, palpitations, diaphoresis, dyspnea and peripheral edema. ; No orthopnea, PND Respiratory: Negative for cough, hemoptysis, wheezing and stridor. No pleuritic chestpain. Gastrointestinal: Negative for nausea, vomiting, diarrhea, constipation, abdominal pain, melena, blood in stool, hematemesis, jaundice and rectal bleeding.    Genitourinary: Negative for frequency, dysuria, incontinence,flank pain and hematuria; Musculoskeletal: Negative for back pain and neck pain. Negative for swelling and  trauma.;  Skin: . Negative for pruritus, rash, abrasions, bruising and skin lesion.; ulcerations Neuro: Negative for headache, lightheadedness and neck stiffness. Negative for weakness, altered level of consciousness , altered mental status, extremity weakness, burning feet, involuntary movement, seizure and syncope.  Psych: negative for anxiety, depression, insomnia, tearfulness, panic attacks, hallucinations, paranoia, suicidal or homicidal ideation   Past Medical History  Diagnosis Date  . 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 Hyperlipidemia   . Hypertension   . PVD (peripheral vascular disease) (HCC)     Left iliac PCI/stent  . Cerebrovascular disease   . Tobacco abuse, in remission     Remote  . GERD (gastroesophageal reflux disease)   . Hypothyroid   . Allergic rhinitis   . Chronic lung disease     ?  Rheumatoid lung; ?  Adverse reaction to methotrexate  . Schatzki's ring   . Hx of Clostridium difficile infection     Recurrent; associated 40 pound weight loss  . Achalasia   . Diabetes mellitus     borderline no meds  . Atrial fibrillation (HCC)   . Chronic back pain   . 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 Cervical spondylosis   . DDD (degenerative disc disease), lumbar   . Pneumonia   . Carotid artery occlusion   . Nephrolithiasis 2010    s/p lithotripsy  . Hiatal hernia     Past Surgical History  Procedure Laterality Date  . Coronary artery bypass graft      x6 In 1998  . Total knee arthroplasty  Right  . Ankle fusion      Left  . Wrist fusion      Right  . Shoulder surgery    . Colonoscopy  09/12/2011    Dr. Elly Modena hemorrhoids, normal colon and distal terminal ileum. Random bx negative. Stool for CDiff positive.  . Esophagogastroduodenoscopy  09/13/2011    Dr. Lyndle Herrlich plawues mid-esophagus KOH negative. Distal esophageal ring and ulcer. Mild gastritis. duodenal diverticulum. Savaory dilation  31mm.   Gaspar Bidding dilation  09/13/2011  . Esophageal manometry  09/30/2011    Procedure: ESOPHAGEAL MANOMETRY (EM);  Surgeon: Rob Bunting, MD;  Location: [facility];  Service: Endoscopy;  Laterality: N/A;  . Esophagogastroduodenoscopy  09/12/2011    Dr. Rinaldo Ratel rings s/p dilation  . Cardiac surgery    . Esophagomyotomy  11/12/11    [facility]- with DOR antireflux surgery  . Esophagogastroduodenoscopy  06/25/2012    Procedure: ESOPHAGOGASTRODUODENOSCOPY (EGD);  Surgeon: Corbin Ade, MD;  Location: [facility];  Service: Endoscopy;  Laterality: N/A;  7:30  . Savory dilation  06/25/2012    Procedure: SAVORY DILATION;  Surgeon: Corbin Ade, MD;  Location: [facility];  Service: Endoscopy;  Laterality: N/A;  Elease Hashimoto dilation  06/25/2012    Procedure: Elease Hashimoto DILATION;  Surgeon: Corbin Ade, MD;  Location: [facility];  Service: Endoscopy;  Laterality: N/A;  . Ankle fusion  09/01/2012    Procedure: ANKLE FUSION;  Surgeon: Valeria Batman, MD;  Location: [facility] OR;  Service: Orthopedics;  Laterality: Left;  Take down of angulated tibial/fibula Fractures  . Femoral-tibial bypass graft Left 01/28/2013    Procedure: BYPASS GRAFT FEMORAL-TIBIAL ARTERY;  Surgeon: Nada Libman, MD;  Location: [facility] OR;  Service: Vascular;  Laterality: Left;  Ultrasound Guided  . Amputation Left 01/28/2013    Procedure: AMPUTATION LEFT 5TH TOE;  Surgeon: Nada Libman, MD;  Location: [facility] OR;  Service: Vascular;  Laterality: Left;  . Spine surgery  11-03-13  . Abdominal aortagram N/A 01/01/2013    Procedure: ABDOMINAL Ronny Flurry;  Surgeon: Sherren Kerns, MD;  Location[facility] CATH LAB;  Service: Cardiovascular;  Laterality: N/A;  . Abdominal aortagram N/A 10/05/2013    Procedure: ABDOMINAL AORTAGRAM;  Surgeon: Nada Libman, MD;  Location: [facility] CATH LAB;  Service: Cardiovascular;  Laterality: N/A;  . Lower extremity angiogram Left 10/05/2013    Procedure: LOWER EXTREMITY ANGIOGRAM;  Surgeon: Nada Libman, MD;   Location: [facility] CATH LAB;  Service: Cardiovascular;  Laterality: Left;  . Abdominal aortagram N/A 11/01/2014    Procedure: ABDOMINAL Ronny Flurry;  Surgeon: Nada Libman, MD;  Location: [facility] CATH LAB;  Service: Cardiovascular;  Laterality: N/A;  . Joint replacement Right 1999    Knee  . Toe amputation Left 09/05/2014    Removed left fifth toe  . Cataract extraction w/phaco Left 12/22/2014    Procedure: CATARACT EXTRACTION PHACO AND INTRAOCULAR LENS PLACEMENT (IOC);  Surgeon: Gemma Payor, MD;  Location: [facility] ORS;  Service: Ophthalmology;  Laterality: Left;  CDE:11.89  . Cataract extraction w/phaco Right 01/30/2015    Procedure: CATARACT EXTRACTION PHACO AND INTRAOCULAR LENS PLACEMENT RIGHT EYE;  Surgeon: Gemma Payor, MD;  Location: [facility] ORS;  Service: Ophthalmology;  Laterality: Right;  CDE:10.56  . Eye surgery Left December 22, 2014    Cataract  . Eye surgery Right December 31, 2014    Cataract    Medications:  HOME MEDS: Prior to Admission medications   Medication Sig Start Date End Date Taking? Authorizing Provider  ALPRAZolam (  XANAX) 1 MG tablet TAKE 1 TABLET BY MOUTH ONCE DAILY AS NEEDED FOR ANXIETY. 05/11/15  Yes Merlyn Albert, MD  aspirin EC 81 MG tablet Take 81 mg by mouth daily.     Yes Historical Provider, MD  atenolol (TENORMIN) 25 MG tablet TAKE (1) TABLET BY MOUTH TWICE DAILY. 04/12/15  Yes Merlyn Albert, MD  atorvastatin (LIPITOR) 80 MG tablet Take 1 tablet (80 mg total) by mouth daily. 01/18/15  Yes Merlyn Albert, MD  finasteride (PROSCAR) 5 MG tablet Take 5 mg by mouth daily.   Yes Historical Provider, MD  folic acid (FOLVITE) 1 MG tablet Take 1 mg by mouth daily.   Yes Historical Provider, MD  furosemide (LASIX) 20 MG tablet 1/2 to 1 qam for swelling 04/11/15  Yes Babs Sciara, MD  HYDROcodone-acetaminophen (NORCO) 10-325 MG per tablet Take 1 tablet up to five times daily 04/04/15  Yes Merlyn Albert, MD  ibuprofen (ADVIL,MOTRIN) 800 MG tablet TAKE ONE TABLET BY MOUTH THREE TIMES  DAILY. 12/21/14  Yes Merlyn Albert, MD  leflunomide (ARAVA) 20 MG tablet TAKE 2 TABLETS BY MOUTH DAILY 05/12/15  Yes Merlyn Albert, MD  levothyroxine (SYNTHROID, LEVOTHROID) 88 MCG tablet TAKE ONE TABLET BY MOUTH ONCE DAILY. 06/09/15  Yes Merlyn Albert, MD  lisinopril (PRINIVIL,ZESTRIL) 20 MG tablet TAKE ONE TABLET BY MOUTH DAILY. 05/04/15  Yes Babs Sciara, MD  Melatonin 3 MG TABS Take 3-6 mg by mouth at bedtime as needed.    Yes Historical Provider, MD  Multiple Vitamin (MULTIVITAMIN) capsule Take 1 capsule by mouth daily.   Yes Historical Provider, MD  omeprazole (PRILOSEC OTC) 20 MG tablet Take 20 mg by mouth 2 (two) times daily.   Yes Historical Provider, MD  OVER THE COUNTER MEDICATION Take 1 capsule by mouth daily. "Florajen 3 Probiotic"   Yes Historical Provider, MD  predniSONE (DELTASONE) 10 MG tablet TAKE ONE TABLET BY MOUTH ONCE DAILY. 04/04/15  Yes Merlyn Albert, MD  tamsulosin (FLOMAX) 0.4 MG CAPS capsule Take 0.4 mg by mouth daily.   Yes Historical Provider, MD  tiZANidine (ZANAFLEX) 4 MG tablet TAKE 1 TABLET BY MOUTH THREE TIMES DAILY AS NEEDED FOR MUSCLE SPASMS. 05/31/15  Yes Merlyn Albert, MD  Vitamin D, Ergocalciferol, (DRISDOL) 50000 UNITS CAPS capsule Take 50,000 Units by mouth every 7 (seven) days. On Monday.   Yes Historical Provider, MD  HYDROcodone-homatropine (HYCODAN) 5-1.5 MG/5ML syrup Take 5 mLs by mouth at bedtime as needed for cough. 06/22/15   Kalman Shan, MD     Allergies:  Allergies  Allergen Reactions  . Clindamycin/Lincomycin Other (See Comments)    Kidney Failure  . Amoxicillin Hives and Other (See Comments)    Hallucinations   . Ciprofloxacin Other (See Comments)    C-diff  . Other Hives, Diarrhea and Other (See Comments)    "Mycin" Causes C-diff  . Doxycycline Nausea And Vomiting  . Levofloxacin Nausea And Vomiting    Social History:   reports that he quit smoking about 49 years ago. His smoking use included Cigarettes. He started  smoking about 58 years ago. He has a 2.5 pack-year smoking history. He has quit using smokeless tobacco. He reports that he does not drink alcohol or use illicit drugs.  Family History: Family History  Problem Relation Age of Onset  . Stomach cancer Mother 3  . Cancer Mother     Stomach  . Heart disease Mother   . Heart attack Mother   .  Heart attack Father   . Heart disease Father   . Heart disease Brother   . Leukemia Brother   . Lung disease Son     infant  . Lung disease Daughter     infant     Physical Exam: Filed Vitals:   06/24/15 0735 06/24/15 1036 06/24/15 1138  BP: 139/84 192/83 126/87  Pulse: 67 73 62  Temp: 98.6 F (37 C)  98.2 F (36.8 C)  TempSrc:   Oral  Resp: Height:  (1.727 m)   (1.727 m)  Weight: 64.411 kg (142 lb)  62.324 kg (137 lb 6.4 oz)  SpO2: 98% 93% 96%   Blood pressure 126/87, pulse 62, temperature 98.2 F (36.8 C), temperature source Oral, resp. rate 18, height  (1.727 m), weight 62.324 kg (137 lb 6.4 oz), SpO2 96 %.  GEN:  Pleasant  patient lying in the stretcher in no acute distress; cooperative with exam. PSYCH:  alert and oriented x4; does not appear anxious or depressed; affect is appropriate. HEENT: Mucous membranes pink and anicteric; PERRLA; EOM intact; no cervical lymphadenopathy nor thyromegaly or carotid bruit; no JVD; There were no stridor. Neck is very supple. Breasts:: Not examined CHEST WALL: No tenderness CHEST: Normal respiration, clear to auscultation bilaterally.  HEART: Regular rate and rhythm.  There are no murmur, rub, or gallops.   BACK: No kyphosis or scoliosis; no CVA tenderness ABDOMEN: soft and non-tender; no masses, no organomegaly, normal abdominal bowel sounds; no pannus; no intertriginous candida. There is no rebound and no distention. Rectal Exam: Not done EXTREMITIES: No bone or joint deformity; age-appropriate arthropathy of the hands and knees; no edema; no ulcerations.  There is no  calf tenderness. Genitalia: not examined PULSES: 2+ and symmetric SKIN: Normal hydration no rash or ulceration CNS: Cranial nerves 2-12 grossly intact no focal lateralizing neurologic deficit.  Speech is fluent; uvula elevated with phonation, facial symmetry and tongue midline. DTR are normal bilaterally, cerebella exam is intact, barbinski is negative and strengths are equaled bilaterally.  No sensory loss.   Labs on Admission:  Basic Metabolic Panel:  Recent Labs Lab 06/24/15 0808  NA 143  K 3.5  CL 118*  CO2 18*  GLUCOSE 88  BUN 20  CREATININE 1.46*  CALCIUM 7.0*   Liver Function Tests:  Recent Labs Lab 06/24/15 0808  AST 27  ALT 21  ALKPHOS 84  BILITOT 0.6  PROT 6.5  ALBUMIN 3.2*   CBC:  Recent Labs Lab 06/24/15 0808  WBC 12.6*  NEUTROABS 9.5*  HGB 10.2*  HCT 32.7*  MCV 94.8  PLT 167    Radiological Exams on Admission: Ct Head Wo Contrast  06/24/2015   CLINICAL DATA:  Pain following fall  EXAM: CT HEAD WITHOUT CONTRAST  CT CERVICAL SPINE WITHOUT CONTRAST  TECHNIQUE: Multidetector CT imaging of the head and cervical spine was performed following the standard protocol without intravenous contrast. Multiplanar CT image reconstructions of the cervical spine were also generated.  COMPARISON:  Cervical MRI April 29, 2015.  Head CT Feb 04, 2014  FINDINGS: CT HEAD FINDINGS  There is mild diffuse atrophy. There is no intracranial mass, hemorrhage, extra-axial fluid collection, or midline shift. There is patchy small vessel disease in the centra semiovale bilaterally. There is decreased attenuation in the inferomedial right occipital lobe consistent with prior infarct, not apparent on prior study. There is a small focus of decreased attenuation in the head of the caudate nucleus on the  right, consistent with a prior small lacunar infarct in this area. On the current examination, there is rather subtle decreased attenuation at the left frontal -parietal junction which may  represent an early infarct. No other evidence suggesting potential acute infarct. Bony calvarium appears intact. Mastoid air cells on the right are clear. There is opacification of several ethmoid air cells on the left.  CT CERVICAL SPINE FINDINGS  There is no fracture. There is slight retrolisthesis of C3 on C4. There is slight anterolisthesis of C5 on C6. There is slight retrolisthesis of C6 on C7. These areas of spondylolisthesis are felt to be due to underlying spondylosis. Prevertebral soft tissues and predental space regions are normal. There is marked disc space narrowing at C3-4, C6-7, and C7-T1. There is moderate disc space narrowing at C2-3 and C5-6. There is facet hypertrophy to varying degrees at most levels. There is broad-based disc protrusion exacerbated by the spondylolisthesis at C3-4. There is slight central stenosis at this level due to these findings. There is marked exit foraminal narrowing at C6-7 and C7-T1 on the right. There is moderate narrowing of the canal at C5-6 on the right due to bony hypertrophy.  There is calcification in each carotid artery. There is scarring in each lung apex.  IMPRESSION: CT head: Question early infarct left frontal -parietal junction. This finding is best appreciated on axial slices 23 and 24 series 2. There is evidence of a prior infarct in the inferomedial right occipital lobe. There is a tiny prior infarct in the head of the caudate nucleus on the right. There is atrophy with mild periventricular small vessel disease. There is no hemorrhage or mass effect. There is left-sided mastoid air cell disease, not present previously.  CT cervical spine: Multilevel arthropathy. Areas of mild spondylolisthesis at several levels, felt to be due to underlying spondylosis. No acute fracture. Calcification in each carotid artery.   Electronically Signed   By: Bretta Bang III M.D.   On: 06/24/2015 08:54   Ct Cervical Spine Wo Contrast  06/24/2015   CLINICAL DATA:   Pain following fall  EXAM: CT HEAD WITHOUT CONTRAST  CT CERVICAL SPINE WITHOUT CONTRAST  TECHNIQUE: Multidetector CT imaging of the head and cervical spine was performed following the standard protocol without intravenous contrast. Multiplanar CT image reconstructions of the cervical spine were also generated.  COMPARISON:  Cervical MRI April 29, 2015.  Head CT Feb 04, 2014  FINDINGS: CT HEAD FINDINGS  There is mild diffuse atrophy. There is no intracranial mass, hemorrhage, extra-axial fluid collection, or midline shift. There is patchy small vessel disease in the centra semiovale bilaterally. There is decreased attenuation in the inferomedial right occipital lobe consistent with prior infarct, not apparent on prior study. There is a small focus of decreased attenuation in the head of the caudate nucleus on the right, consistent with a prior small lacunar infarct in this area. On the current examination, there is rather subtle decreased attenuation at the left frontal -parietal junction which may represent an early infarct. No other evidence suggesting potential acute infarct. Bony calvarium appears intact. Mastoid air cells on the right are clear. There is opacification of several ethmoid air cells on the left.  CT CERVICAL SPINE FINDINGS  There is no fracture. There is slight retrolisthesis of C3 on C4. There is slight anterolisthesis of C5 on C6. There is slight retrolisthesis of C6 on C7. These areas of spondylolisthesis are felt to be due to underlying spondylosis. Prevertebral soft tissues and predental  space regions are normal. There is marked disc space narrowing at C3-4, C6-7, and C7-T1. There is moderate disc space narrowing at C2-3 and C5-6. There is facet hypertrophy to varying degrees at most levels. There is broad-based disc protrusion exacerbated by the spondylolisthesis at C3-4. There is slight central stenosis at this level due to these findings. There is marked exit foraminal narrowing at C6-7 and  C7-T1 on the right. There is moderate narrowing of the canal at C5-6 on the right due to bony hypertrophy.  There is calcification in each carotid artery. There is scarring in each lung apex.  IMPRESSION: CT head: Question early infarct left frontal -parietal junction. This finding is best appreciated on axial slices 23 and 24 series 2. There is evidence of a prior infarct in the inferomedial right occipital lobe. There is a tiny prior infarct in the head of the caudate nucleus on the right. There is atrophy with mild periventricular small vessel disease. There is no hemorrhage or mass effect. There is left-sided mastoid air cell disease, not present previously.  CT cervical spine: Multilevel arthropathy. Areas of mild spondylolisthesis at several levels, felt to be due to underlying spondylosis. No acute fracture. Calcification in each carotid artery.   Electronically Signed   By: Bretta Bang III M.D.   On: 06/24/2015 08:54    EKG: Independently reviewed.   Assessment/Plan Present on Admission:  . Stroke (HCC) . TIA (transient ischemic attack) . Peripheral vascular disease, unspecified . ILD (interstitial lung disease) . Hypothyroidism . Hypertension . Hyperlipidemia . Rheumatoid arthritis  PLAN:  Will admit him to telemetry for acute CVA.  Since he has been on ASA regularly, will change it to Plavix.  Will admit him for stroke work up to include MRI brain, carotid US, and ECHO.  Will get bedside swallow, and obtain OT/PT evaluation.  He will be given permissive HTN.  For his hypothyroidism, will continue with supplement.  He was told he may have ILD from [facility], so it has been discontinued.  He has been on chronic steroids, so we will continue it.  He is stable, full code, and will be admitted to [facility] service.  Thank you and Good Day.   Other plans as per orders.  Code Status: FULL Unk Lightning, MD. [facility] Pager [phone] 7pm to 7am.  06/24/2015, 12:31  PM

## 2015-06-24 NOTE — ED Provider Notes (Signed)
CSN: [id number]     Arrival date & time 06/24/15  0732 History   First MD Initiated Contact with Patient 06/24/15 [phone]     Chief Complaint  Patient presents with  . Extremity Weakness    right  . Fall     (Consider location/radiation/quality/duration/timing/severity/associated sxs/prior Treatment) Patient is a 69 y.o. male presenting with extremity weakness and fall. The history is provided by the patient (The patient states that around 6 PM last night history of weakness in his right arm. He is not sure how long it lasted but it went away and he was back to normal at bedtime. Woke up this morning had a witnessed back again in his right arm along with weakn).  Extremity Weakness This is a new problem. The current episode started 12 to 24 hours ago. The problem occurs constantly. The problem has not changed since onset.Pertinent negatives include no chest pain, no abdominal pain and no headaches. Nothing aggravates the symptoms. Nothing relieves the symptoms.  Fall Pertinent negatives include no chest pain, no abdominal pain and no headaches.    Past Medical History  Diagnosis Date  . 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 Hyperlipidemia   . Hypertension   . PVD (peripheral vascular disease) (HCC)     Left iliac PCI/stent  . Cerebrovascular disease   . Tobacco abuse, in remission     Remote  . GERD (gastroesophageal reflux disease)   . Hypothyroid   . Allergic rhinitis   . Chronic lung disease     ?  Rheumatoid lung; ?  Adverse reaction to methotrexate  . Schatzki's ring   . Hx of Clostridium difficile infection     Recurrent; associated 40 pound weight loss  . Achalasia   . Diabetes mellitus     borderline no meds  . Atrial fibrillation (HCC)   . Chronic back pain   . 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 Cervical spondylosis   . DDD (degenerative disc disease), lumbar   . Pneumonia   . Carotid artery occlusion   .  Nephrolithiasis 2010    s/p lithotripsy  . Hiatal hernia    Past Surgical History  Procedure Laterality Date  . Coronary artery bypass graft      x6 In 1998  . Total knee arthroplasty      Right  . Ankle fusion      Left  . Wrist fusion      Right  . Shoulder surgery    . Colonoscopy  09/12/2011    Dr. Elly Modena hemorrhoids, normal colon and distal terminal ileum. Random bx negative. Stool for CDiff positive.  . Esophagogastroduodenoscopy  09/13/2011    Dr. Lyndle Herrlich plawues mid-esophagus KOH negative. Distal esophageal ring and ulcer. Mild gastritis. duodenal diverticulum. Savaory dilation 35mm.   Gaspar Bidding dilation  09/13/2011  . Esophageal manometry  09/30/2011    Procedure: ESOPHAGEAL MANOMETRY (EM);  Surgeon: Rob Bunting, MD;  Location: [facility];  Service: Endoscopy;  Laterality: N/A;  . Esophagogastroduodenoscopy  09/12/2011    Dr. Rinaldo Ratel rings s/p dilation  . Cardiac surgery    . Esophagomyotomy  11/12/11    [facility]- with DOR antireflux surgery  . Esophagogastroduodenoscopy  06/25/2012    Procedure: ESOPHAGOGASTRODUODENOSCOPY (EGD);  Surgeon: Corbin Ade, MD;  Location: [facility];  Service: Endoscopy;  Laterality: N/A;  7:30  . Savory dilation  06/25/2012    Procedure: SAVORY DILATION;  Surgeon: Corbin Ade, MD;  Location: [facility];  Service: Endoscopy;  Laterality: N/A;  Elease Hashimoto dilation  06/25/2012    Procedure: Elease Hashimoto DILATION;  Surgeon: Corbin Ade, MD;  Location: [facility];  Service: Endoscopy;  Laterality: N/A;  . Ankle fusion  09/01/2012    Procedure: ANKLE FUSION;  Surgeon: Valeria Batman, MD;  Location: [facility] OR;  Service: Orthopedics;  Laterality: Left;  Take down of angulated tibial/fibula Fractures  . Femoral-tibial bypass graft Left 01/28/2013    Procedure: BYPASS GRAFT FEMORAL-TIBIAL ARTERY;  Surgeon: Nada Libman, MD;  Location: [facility] OR;  Service: Vascular;  Laterality: Left;  Ultrasound Guided  . Amputation Left  01/28/2013    Procedure: AMPUTATION LEFT 5TH TOE;  Surgeon: Nada Libman, MD;  Location: [facility] OR;  Service: Vascular;  Laterality: Left;  . Spine surgery  11-03-13  . Abdominal aortagram N/A 01/01/2013    Procedure: ABDOMINAL Ronny Flurry;  Surgeon: Sherren Kerns, MD;  Location: [facility] CATH LAB;  Service: Cardiovascular;  Laterality: N/A;  . Abdominal aortagram N/A 10/05/2013    Procedure: ABDOMINAL AORTAGRAM;  Surgeon: Nada Libman, MD;  Location: [facility] CATH LAB;  Service: Cardiovascular;  Laterality: N/A;  . Lower extremity angiogram Left 10/05/2013    Procedure: LOWER EXTREMITY ANGIOGRAM;  Surgeon: Nada Libman, MD;  Location: [facility] CATH LAB;  Service: Cardiovascular;  Laterality: Left;  . Abdominal aortagram N/A 11/01/2014    Procedure: ABDOMINAL Ronny Flurry;  Surgeon: Nada Libman, MD;  Location: [facility] CATH LAB;  Service: Cardiovascular;  Laterality: N/A;  . Joint replacement Right 1999    Knee  . Toe amputation Left 09/05/2014    Removed left fifth toe  . Cataract extraction w/phaco Left 12/22/2014    Procedure: CATARACT EXTRACTION PHACO AND INTRAOCULAR LENS PLACEMENT (IOC);  Surgeon: Gemma Payor, MD;  Location: [facility] ORS;  Service: Ophthalmology;  Laterality: Left;  CDE:11.89  . Cataract extraction w/phaco Right 01/30/2015    Procedure: CATARACT EXTRACTION PHACO AND INTRAOCULAR LENS PLACEMENT RIGHT EYE;  Surgeon: Gemma Payor, MD;  Location: [facility] ORS;  Service: Ophthalmology;  Laterality: Right;  CDE:10.56  . Eye surgery Left December 22, 2014    Cataract  . Eye surgery Right December 31, 2014    Cataract   Family History  Problem Relation Age of Onset  . Stomach cancer Mother 45  . Cancer Mother     Stomach  . Heart disease Mother   . Heart attack Mother   . Heart attack Father   . Heart disease Father   . Heart disease Brother   . Leukemia Brother   . Lung disease Son     infant  . Lung disease Daughter     infant   Social History  Substance Use Topics  . Smoking status: Former Smoker -- 0.50  packs/day for 5 years    Types: Cigarettes    Start date: 09/23/1956    Quit date: 09/23/1965  . Smokeless tobacco: Former Neurosurgeon  . Alcohol Use: No    Review of Systems  Constitutional: Negative for appetite change and fatigue.  HENT: Negative for congestion, ear discharge and sinus pressure.   Eyes: Negative for discharge.  Respiratory: Negative for cough.   Cardiovascular: Negative for chest pain.  Gastrointestinal: Negative for abdominal pain and diarrhea.  Genitourinary: Negative for frequency and hematuria.  Musculoskeletal: Positive for extremity weakness. Negative for back pain.       Weakness in right arm and leg  Skin: Negative for rash.  Neurological: Negative for seizures  and headaches.  Psychiatric/Behavioral: Negative for hallucinations.      Allergies  Clindamycin/lincomycin; Amoxicillin; Ciprofloxacin; Other; Doxycycline; and Levofloxacin  Home Medications   Prior to Admission medications   Medication Sig Start Date End Date Taking? Authorizing Provider  ALPRAZolam (XANAX) 1 MG tablet TAKE 1 TABLET BY MOUTH ONCE DAILY AS NEEDED FOR ANXIETY. 05/11/15  Yes Merlyn Albert, MD  aspirin EC 81 MG tablet Take 81 mg by mouth daily.     Yes Historical Provider, MD  atenolol (TENORMIN) 25 MG tablet TAKE (1) TABLET BY MOUTH TWICE DAILY. 04/12/15  Yes Merlyn Albert, MD  atorvastatin (LIPITOR) 80 MG tablet Take 1 tablet (80 mg total) by mouth daily. 01/18/15  Yes Merlyn Albert, MD  finasteride (PROSCAR) 5 MG tablet Take 5 mg by mouth daily.   Yes Historical Provider, MD  folic acid (FOLVITE) 1 MG tablet Take 1 mg by mouth daily.   Yes Historical Provider, MD  furosemide (LASIX) 20 MG tablet 1/2 to 1 qam for swelling 04/11/15  Yes Babs Sciara, MD  HYDROcodone-acetaminophen (NORCO) 10-325 MG per tablet Take 1 tablet up to five times daily 04/04/15  Yes Merlyn Albert, MD  ibuprofen (ADVIL,MOTRIN) 800 MG tablet TAKE ONE TABLET BY MOUTH THREE TIMES DAILY. 12/21/14  Yes  Merlyn Albert, MD  leflunomide (ARAVA) 20 MG tablet TAKE 2 TABLETS BY MOUTH DAILY 05/12/15  Yes Merlyn Albert, MD  levothyroxine (SYNTHROID, LEVOTHROID) 88 MCG tablet TAKE ONE TABLET BY MOUTH ONCE DAILY. 06/09/15  Yes Merlyn Albert, MD  lisinopril (PRINIVIL,ZESTRIL) 20 MG tablet TAKE ONE TABLET BY MOUTH DAILY. 05/04/15  Yes Babs Sciara, MD  Melatonin 3 MG TABS Take 3-6 mg by mouth at bedtime as needed.    Yes Historical Provider, MD  Multiple Vitamin (MULTIVITAMIN) capsule Take 1 capsule by mouth daily.   Yes Historical Provider, MD  omeprazole (PRILOSEC OTC) 20 MG tablet Take 20 mg by mouth 2 (two) times daily.   Yes Historical Provider, MD  OVER THE COUNTER MEDICATION Take 1 capsule by mouth daily. "Florajen 3 Probiotic"   Yes Historical Provider, MD  predniSONE (DELTASONE) 10 MG tablet TAKE ONE TABLET BY MOUTH ONCE DAILY. 04/04/15  Yes Merlyn Albert, MD  tamsulosin (FLOMAX) 0.4 MG CAPS capsule Take 0.4 mg by mouth daily.   Yes Historical Provider, MD  tiZANidine (ZANAFLEX) 4 MG tablet TAKE 1 TABLET BY MOUTH THREE TIMES DAILY AS NEEDED FOR MUSCLE SPASMS. 05/31/15  Yes Merlyn Albert, MD  Vitamin D, Ergocalciferol, (DRISDOL) 50000 UNITS CAPS capsule Take 50,000 Units by mouth every 7 (seven) days. On Monday.   Yes Historical Provider, MD  HYDROcodone-homatropine (HYCODAN) 5-1.5 MG/5ML syrup Take 5 mLs by mouth at bedtime as needed for cough. 06/22/15   Kalman Shan, MD   BP 192/83 mmHg  Pulse 73  Temp(Src) 98.6 F (37 C)  Resp 18  Ht  (1.727 m)  Wt 142 lb (64.411 kg)  BMI 21.60 kg/m2  SpO2 93% Physical Exam  Constitutional: He is oriented to person, place, and time. He appears well-developed.  HENT:  Head: Normocephalic.  Eyes: Conjunctivae and EOM are normal. No scleral icterus.  Neck: Neck supple. No thyromegaly present.  Cardiovascular: Normal rate and regular rhythm.  Exam reveals no gallop and no friction rub.   No murmur heard. Pulmonary/Chest: No stridor. He  has no wheezes. He has no rales. He exhibits no tenderness.  Abdominal: He exhibits no distension. There is no tenderness.  There is no rebound.  Musculoskeletal: Normal range of motion. He exhibits no edema.  Lymphadenopathy:    He has no cervical adenopathy.  Neurological: He is oriented to person, place, and time. No cranial nerve deficit. He exhibits normal muscle tone.  Patient has severe rheumatoid arthritis in his upper and lower extremities. His right upper extremity is very weak but he is able to look about the bed. He lost some dexterity in that hand. His right leg is also very weak he is able to lift off the bed he is able to stand on it unable to walk without falling.  Skin: No rash noted. No erythema.  Psychiatric: He has a normal mood and affect. His behavior is normal.    ED Course  Procedures (including critical care time) Labs Review Labs Reviewed  CBC WITH DIFFERENTIAL/PLATELET - Abnormal; Notable for the following:    WBC 12.6 (*)    RBC 3.45 (*)    Hemoglobin 10.2 (*)    HCT 32.7 (*)    RDW 18.1 (*)    Neutro Abs 9.5 (*)    All other components within normal limits  COMPREHENSIVE METABOLIC PANEL - Abnormal; Notable for the following:    Chloride 118 (*)    CO2 18 (*)    Creatinine, Ser 1.46 (*)    Calcium 7.0 (*)    Albumin 3.2 (*)    GFR calc non Af Amer 47 (*)    GFR calc Af Amer 54 (*)    All other components within normal limits    Imaging Review Ct Head Wo Contrast  06/24/2015   CLINICAL DATA:  Pain following fall  EXAM: CT HEAD WITHOUT CONTRAST  CT CERVICAL SPINE WITHOUT CONTRAST  TECHNIQUE: Multidetector CT imaging of the head and cervical spine was performed following the standard protocol without intravenous contrast. Multiplanar CT image reconstructions of the cervical spine were also generated.  COMPARISON:  Cervical MRI April 29, 2015.  Head CT Feb 04, 2014  FINDINGS: CT HEAD FINDINGS  There is mild diffuse atrophy. There is no intracranial mass,  hemorrhage, extra-axial fluid collection, or midline shift. There is patchy small vessel disease in the centra semiovale bilaterally. There is decreased attenuation in the inferomedial right occipital lobe consistent with prior infarct, not apparent on prior study. There is a small focus of decreased attenuation in the head of the caudate nucleus on the right, consistent with a prior small lacunar infarct in this area. On the current examination, there is rather subtle decreased attenuation at the left frontal -parietal junction which may represent an early infarct. No other evidence suggesting potential acute infarct. Bony calvarium appears intact. Mastoid air cells on the right are clear. There is opacification of several ethmoid air cells on the left.  CT CERVICAL SPINE FINDINGS  There is no fracture. There is slight retrolisthesis of C3 on C4. There is slight anterolisthesis of C5 on C6. There is slight retrolisthesis of C6 on C7. These areas of spondylolisthesis are felt to be due to underlying spondylosis. Prevertebral soft tissues and predental space regions are normal. There is marked disc space narrowing at C3-4, C6-7, and C7-T1. There is moderate disc space narrowing at C2-3 and C5-6. There is facet hypertrophy to varying degrees at most levels. There is broad-based disc protrusion exacerbated by the spondylolisthesis at C3-4. There is slight central stenosis at this level due to these findings. There is marked exit foraminal narrowing at C6-7 and C7-T1 on the right. There is moderate  narrowing of the canal at C5-6 on the right due to bony hypertrophy.  There is calcification in each carotid artery. There is scarring in each lung apex.  IMPRESSION: CT head: Question early infarct left frontal -parietal junction. This finding is best appreciated on axial slices 23 and 24 series 2. There is evidence of a prior infarct in the inferomedial right occipital lobe. There is a tiny prior infarct in the head of the  caudate nucleus on the right. There is atrophy with mild periventricular small vessel disease. There is no hemorrhage or mass effect. There is left-sided mastoid air cell disease, not present previously.  CT cervical spine: Multilevel arthropathy. Areas of mild spondylolisthesis at several levels, felt to be due to underlying spondylosis. No acute fracture. Calcification in each carotid artery.   Electronically Signed   By: Bretta Bang III M.D.   On: 06/24/2015 08:54   Ct Cervical Spine Wo Contrast  06/24/2015   CLINICAL DATA:  Pain following fall  EXAM: CT HEAD WITHOUT CONTRAST  CT CERVICAL SPINE WITHOUT CONTRAST  TECHNIQUE: Multidetector CT imaging of the head and cervical spine was performed following the standard protocol without intravenous contrast. Multiplanar CT image reconstructions of the cervical spine were also generated.  COMPARISON:  Cervical MRI April 29, 2015.  Head CT Feb 04, 2014  FINDINGS: CT HEAD FINDINGS  There is mild diffuse atrophy. There is no intracranial mass, hemorrhage, extra-axial fluid collection, or midline shift. There is patchy small vessel disease in the centra semiovale bilaterally. There is decreased attenuation in the inferomedial right occipital lobe consistent with prior infarct, not apparent on prior study. There is a small focus of decreased attenuation in the head of the caudate nucleus on the right, consistent with a prior small lacunar infarct in this area. On the current examination, there is rather subtle decreased attenuation at the left frontal -parietal junction which may represent an early infarct. No other evidence suggesting potential acute infarct. Bony calvarium appears intact. Mastoid air cells on the right are clear. There is opacification of several ethmoid air cells on the left.  CT CERVICAL SPINE FINDINGS  There is no fracture. There is slight retrolisthesis of C3 on C4. There is slight anterolisthesis of C5 on C6. There is slight retrolisthesis of  C6 on C7. These areas of spondylolisthesis are felt to be due to underlying spondylosis. Prevertebral soft tissues and predental space regions are normal. There is marked disc space narrowing at C3-4, C6-7, and C7-T1. There is moderate disc space narrowing at C2-3 and C5-6. There is facet hypertrophy to varying degrees at most levels. There is broad-based disc protrusion exacerbated by the spondylolisthesis at C3-4. There is slight central stenosis at this level due to these findings. There is marked exit foraminal narrowing at C6-7 and C7-T1 on the right. There is moderate narrowing of the canal at C5-6 on the right due to bony hypertrophy.  There is calcification in each carotid artery. There is scarring in each lung apex.  IMPRESSION: CT head: Question early infarct left frontal -parietal junction. This finding is best appreciated on axial slices 23 and 24 series 2. There is evidence of a prior infarct in the inferomedial right occipital lobe. There is a tiny prior infarct in the head of the caudate nucleus on the right. There is atrophy with mild periventricular small vessel disease. There is no hemorrhage or mass effect. There is left-sided mastoid air cell disease, not present previously.  CT cervical spine: Multilevel arthropathy. Areas  of mild spondylolisthesis at several levels, felt to be due to underlying spondylosis. No acute fracture. Calcification in each carotid artery.   Electronically Signed   By: Bretta Bang III M.D.   On: 06/24/2015 08:54   I have personally reviewed and evaluated these images and lab results as part of my medical decision-making.   EKG Interpretation   Date/Time:  Saturday June 24 2015 07:37:57 EDT Ventricular Rate:  61 PR Interval:  166 QRS Duration: 79 QT Interval:  407 QTC Calculation: 410 R Axis:   -30 Text Interpretation:  Sinus or ectopic atrial rhythm Atrial premature  complexes LVH with secondary repolarization abnormality Inferior infarct,  old  Anterior Q waves, possibly due to LVH Confirmed by Jeancarlo Leffler  MD, Mackenize Delgadillo  [phone]) on 06/24/2015 7:44:17 AM      MDM   Final diagnoses:  Acute thrombotic stroke [facility]    [facility] will admit.   Bethann Berkshire, MD 06/24/15 1048

## 2015-06-24 NOTE — ED Notes (Signed)
Weakness to right arm last night at 1800.  Vital WNL, Glucose 98.  Last known wellness at 06/23/15 at 1800.

## 2015-06-25 ENCOUNTER — Inpatient Hospital Stay (HOSPITAL_COMMUNITY): Payer: Medicare Other

## 2015-06-25 DIAGNOSIS — I6789 Other cerebrovascular disease: Secondary | ICD-10-CM

## 2015-06-25 LAB — LIPID PANEL
Cholesterol: 186 mg/dL (ref 0–200)
HDL: 34 mg/dL — AB (ref >40)
LDL CALC: 97 mg/dL (ref 0–99)
TRIGLYCERIDES: 273 mg/dL — AB (ref <150)
Total CHOL/HDL Ratio: 5.5 RATIO
VLDL: 55 mg/dL — AB (ref 0–40)

## 2015-06-25 NOTE — Progress Notes (Signed)
Utilization review Completed Tava Peery RN BSN   

## 2015-06-25 NOTE — Progress Notes (Signed)
  Echocardiogram 2D Echocardiogram has been performed.  [name] 06/25/2015, 8:43 AM

## 2015-06-25 NOTE — Plan of Care (Signed)
Problem: Acute Treatment Outcomes Goal: Neuro exam at baseline or improved Outcome: Progressing See NIH Stroke Scale documentation in assessment Goal: BP within ordered parameters Outcome: Progressing See flowsheet Goal: 02 Sats > 94% Outcome: Progressing See flowsheet Goal: Hemodynamically stable Outcome: Progressing See flowsheet  Problem: Progression Outcomes Goal: Progressive activity as tolerated Outcome: Progressing Pt still appears weak and is unable to stand on his own without a two-person assist. Goal: Pain controlled Outcome: Progressing Pt given pain medication and reassessed. Pt currently asleep and resting comfortably.

## 2015-06-25 NOTE — Progress Notes (Signed)
[facility] PROGRESS NOTE  DEJUAN ELMAN TFT:[id number] DOB: 1946/04/13    PCP:   Lubertha South, MD   HPI: LOPEZ DENTINGER is an 69 y.o. 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  He has been on ASA, and was changed to Plavix. His work up thus far included a non obstructive carotid US, and ECHO and MRI of the brain was pending.  He is on Plavix and maximum dose of statin.  He also has hx of ILD, felt to be due to methytrexate taken for his RA.  It was long discontinued, and his pulmonary pressure was 39.  His pulmonologist had referred him to Dr Wyline Mood for consideration of a RHC for definitive diagnosis of pulmonary HTN.  He was due to see him on Monday in the office. With respect to his CVA, he has very minimal neurological deficit, which is truly a blessing.   Rewiew of Systems:  Constitutional: Negative for malaise, fever and chills. No significant weight loss or weight gain Eyes: Negative for eye pain, redness and discharge, diplopia, visual changes, or flashes of light. ENMT: Negative for ear pain, hoarseness, nasal congestion, sinus pressure and sore throat. No headaches; tinnitus, drooling, or problem swallowing. Cardiovascular: Negative for chest pain, palpitations, diaphoresis, dyspnea and peripheral edema. ; No orthopnea, PND Respiratory: Negative for cough, hemoptysis, wheezing and stridor. No pleuritic chestpain. Gastrointestinal: Negative for nausea, vomiting, diarrhea, constipation, abdominal pain, melena, blood in stool, hematemesis, jaundice and rectal bleeding.    Genitourinary: Negative for frequency, dysuria, incontinence,flank pain and hematuria; Musculoskeletal: Negative for back pain and neck pain. Negative for swelling and trauma.;  Skin: . Negative for pruritus, rash, abrasions, bruising and skin lesion.; ulcerations Neuro: Negative for headache, lightheadedness and neck stiffness. Negative for weakness, altered level of consciousness , altered mental  status, extremity weakness, burning feet, involuntary movement, seizure and syncope.  Psych: negative for anxiety, depression, insomnia, tearfulness, panic attacks, hallucinations, paranoia, suicidal or homicidal ideation    Past Medical History  Diagnosis Date  . 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 Hyperlipidemia   . Hypertension   . PVD (peripheral vascular disease) (HCC)     Left iliac PCI/stent  . Cerebrovascular disease   . Tobacco abuse, in remission     Remote  . GERD (gastroesophageal reflux disease)   . Hypothyroid   . Allergic rhinitis   . Chronic lung disease     ?  Rheumatoid lung; ?  Adverse reaction to methotrexate  . Schatzki's ring   . Hx of Clostridium difficile infection     Recurrent; associated 40 pound weight loss  . Achalasia   . Diabetes mellitus     borderline no meds  . Atrial fibrillation (HCC)   . Chronic back pain   . 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 Cervical spondylosis   . DDD (degenerative disc disease), lumbar   . Pneumonia   . Carotid artery occlusion   . Nephrolithiasis 2010    s/p lithotripsy  . Hiatal hernia     Past Surgical History  Procedure Laterality Date  . Coronary artery bypass graft      x6 In 1998  . Total knee arthroplasty      Right  . Ankle fusion      Left  . Wrist fusion      Right  . Shoulder surgery    . Colonoscopy  09/12/2011    Dr. Elly Modena  hemorrhoids, normal colon and distal terminal ileum. Random bx negative. Stool for CDiff positive.  . Esophagogastroduodenoscopy  09/13/2011    Dr. Lyndle Herrlich plawues mid-esophagus KOH negative. Distal esophageal ring and ulcer. Mild gastritis. duodenal diverticulum. Savaory dilation 16mm.   Gaspar Bidding dilation  09/13/2011  . Esophageal manometry  09/30/2011    Procedure: ESOPHAGEAL MANOMETRY (EM);  Surgeon: Rob Bunting, MD;  Location: [facility];  Service: Endoscopy;  Laterality: N/A;  .  Esophagogastroduodenoscopy  09/12/2011    Dr. Rinaldo Ratel rings s/p dilation  . Cardiac surgery    . Esophagomyotomy  11/12/11    [facility]- with [facility] antireflux surgery  . Esophagogastroduodenoscopy  06/25/2012    Procedure: ESOPHAGOGASTRODUODENOSCOPY (EGD);  Surgeon: Corbin Ade, MD;  Location: [facility];  Service: Endoscopy;  Laterality: N/A;  7:30  . Savory dilation  06/25/2012    Procedure: SAVORY DILATION;  Surgeon: Corbin Ade, MD;  Location: [facility];  Service: Endoscopy;  Laterality: N/A;  Elease Hashimoto dilation  06/25/2012    Procedure: Elease Hashimoto DILATION;  Surgeon: Corbin Ade, MD;  Location: [facility];  Service: Endoscopy;  Laterality: N/A;  . Ankle fusion  09/01/2012    Procedure: ANKLE FUSION;  Surgeon: Valeria Batman, MD;  Location: [facility] OR;  Service: Orthopedics;  Laterality: Left;  Take down of angulated tibial/fibula Fractures  . Femoral-tibial bypass graft Left 01/28/2013    Procedure: BYPASS GRAFT FEMORAL-TIBIAL ARTERY;  Surgeon: Nada Libman, MD;  Location: [facility] OR;  Service: Vascular;  Laterality: Left;  Ultrasound Guided  . Amputation Left 01/28/2013    Procedure: AMPUTATION LEFT 5TH TOE;  Surgeon: Nada Libman, MD;  Location: [facility] OR;  Service: Vascular;  Laterality: Left;  . Spine surgery  11-03-13  . Abdominal aortagram N/A 01/01/2013    Procedure: ABDOMINAL Ronny Flurry;  Surgeon: Sherren Kerns, MD;  Location: [facility] CATH LAB;  Service: Cardiovascular;  Laterality: N/A;  . Abdominal aortagram N/A 10/05/2013    Procedure: ABDOMINAL AORTAGRAM;  Surgeon: Nada Libman, MD;  Location: [facility] CATH LAB;  Service: Cardiovascular;  Laterality: N/A;  . Lower extremity angiogram Left 10/05/2013    Procedure: LOWER EXTREMITY ANGIOGRAM;  Surgeon: Nada Libman, MD;  Location: [facility] CATH LAB;  Service: Cardiovascular;  Laterality: Left;  . Abdominal aortagram N/A 11/01/2014    Procedure: ABDOMINAL Ronny Flurry;  Surgeon: Nada Libman, MD;  Location: [facility] CATH LAB;  Service:  Cardiovascular;  Laterality: N/A;  . Joint replacement Right 1999    Knee  . Toe amputation Left 09/05/2014    Removed left fifth toe  . Cataract extraction w/phaco Left 12/22/2014    Procedure: CATARACT EXTRACTION PHACO AND INTRAOCULAR LENS PLACEMENT (IOC);  Surgeon: Gemma Payor, MD;  Location: [facility]S;  Service: Ophthalmology;  Laterality: Left;  CDE:11.89  . Cataract extraction w/phaco Right 01/30/2015    Procedure: CATARACT EXTRACTION PHACO AND INTRAOCULAR LENS PLACEMENT RIGHT EYE;  Surgeon: Gemma Payor, MD;  Location: [facility];  Service: Ophthalmology;  Laterality: Right;  CDE:10.56  . Eye surgery Left December 22, 2014    Cataract  . Eye surgery Right December 31, 2014    Cataract    Medications:  HOME MEDS: Prior to Admission medications   Medication Sig Start Date End Date Taking? Authorizing Provider  ALPRAZolam (XANAX) 1 MG tablet TAKE 1 TABLET BY MOUTH ONCE DAILY AS NEEDED FOR ANXIETY. 05/11/15  Yes Merlyn Albert, MD  aspirin EC 81 MG tablet Take 81 mg by mouth daily.  Yes Historical Provider, MD  atenolol (TENORMIN) 25 MG tablet TAKE (1) TABLET BY MOUTH TWICE DAILY. 04/12/15  Yes Merlyn Albert, MD  atorvastatin (LIPITOR) 80 MG tablet Take 1 tablet (80 mg total) by mouth daily. 01/18/15  Yes Merlyn Albert, MD  finasteride (PROSCAR) 5 MG tablet Take 5 mg by mouth daily.   Yes Historical Provider, MD  folic acid (FOLVITE) 1 MG tablet Take 1 mg by mouth daily.   Yes Historical Provider, MD  furosemide (LASIX) 20 MG tablet 1/2 to 1 qam for swelling 04/11/15  Yes Babs Sciara, MD  HYDROcodone-acetaminophen (NORCO) 10-325 MG per tablet Take 1 tablet up to five times daily 04/04/15  Yes Merlyn Albert, MD  ibuprofen (ADVIL,MOTRIN) 800 MG tablet TAKE ONE TABLET BY MOUTH THREE TIMES DAILY. 12/21/14  Yes Merlyn Albert, MD  leflunomide (ARAVA) 20 MG tablet TAKE 2 TABLETS BY MOUTH DAILY 05/12/15  Yes Merlyn Albert, MD  levothyroxine (SYNTHROID, LEVOTHROID) 88 MCG tablet TAKE ONE TABLET  BY MOUTH ONCE DAILY. 06/09/15  Yes Merlyn Albert, MD  lisinopril (PRINIVIL,ZESTRIL) 20 MG tablet TAKE ONE TABLET BY MOUTH DAILY. 05/04/15  Yes Babs Sciara, MD  Melatonin 3 MG TABS Take 3-6 mg by mouth at bedtime as needed.    Yes Historical Provider, MD  Multiple Vitamin (MULTIVITAMIN) capsule Take 1 capsule by mouth daily.   Yes Historical Provider, MD  omeprazole (PRILOSEC OTC) 20 MG tablet Take 20 mg by mouth 2 (two) times daily.   Yes Historical Provider, MD  OVER THE COUNTER MEDICATION Take 1 capsule by mouth daily. "Florajen 3 Probiotic"   Yes Historical Provider, MD  predniSONE (DELTASONE) 10 MG tablet TAKE ONE TABLET BY MOUTH ONCE DAILY. 04/04/15  Yes Merlyn Albert, MD  tamsulosin (FLOMAX) 0.4 MG CAPS capsule Take 0.4 mg by mouth daily.   Yes Historical Provider, MD  tiZANidine (ZANAFLEX) 4 MG tablet TAKE 1 TABLET BY MOUTH THREE TIMES DAILY AS NEEDED FOR MUSCLE SPASMS. 05/31/15  Yes Merlyn Albert, MD  Vitamin D, Ergocalciferol, (DRISDOL) 50000 UNITS CAPS capsule Take 50,000 Units by mouth every 7 (seven) days. On Monday.   Yes Historical Provider, MD  HYDROcodone-homatropine (HYCODAN) 5-1.5 MG/5ML syrup Take 5 mLs by mouth at bedtime as needed for cough. 06/22/15   Kalman Shan, MD     Allergies:  Allergies  Allergen Reactions  . Clindamycin/Lincomycin Other (See Comments)    Kidney Failure  . Amoxicillin Hives and Other (See Comments)    Hallucinations   . Ciprofloxacin Other (See Comments)    C-diff  . Other Hives, Diarrhea and Other (See Comments)    "Mycin" Causes C-diff  . Doxycycline Nausea And Vomiting  . Levofloxacin Nausea And Vomiting    Social History:   reports that he quit smoking about 49 years ago. His smoking use included Cigarettes. He started smoking about 58 years ago. He has a 2.5 pack-year smoking history. He has quit using smokeless tobacco. He reports that he does not drink alcohol or use illicit drugs.  Family History: Family History   Problem Relation Age of Onset  . Stomach cancer Mother 35  . Cancer Mother     Stomach  . Heart disease Mother   . Heart attack Mother   . Heart attack Father   . Heart disease Father   . Heart disease Brother   . Leukemia Brother   . Lung disease Son     infant  . Lung disease Daughter  infant     Physical Exam: Filed Vitals:   06/25/15 0418 06/25/15 0431 06/25/15 0818 06/25/15 1218  BP: 177/91 173/89 165/75 148/62  Pulse: 64  62 67  Temp: 98.5 F (36.9 C)  98.9 F (37.2 C) 98.3 F (36.8 C)  TempSrc: Oral  Oral Oral  Resp: 18  18 18   Height:      Weight:      SpO2: 97%  94% 95%   Blood pressure 148/62, pulse 67, temperature 98.3 F (36.8 C), temperature source Oral, resp. rate 18, height 5\' 8"  (1.727 m), weight 62.324 kg (137 lb 6.4 oz), SpO2 95 %.  GEN:  Pleasant patient lying in the stretcher in no acute distress; cooperative with exam. PSYCH:  alert and oriented x4; does not appear anxious or depressed; affect is appropriate. HEENT: Mucous membranes pink and anicteric; PERRLA; EOM intact; no cervical lymphadenopathy nor thyromegaly or carotid bruit; no JVD; There were no stridor. Neck is very supple. Breasts:: Not examined CHEST WALL: No tenderness CHEST: Normal respiration, clear to auscultation bilaterally.  HEART: Regular rate and rhythm.  There are no murmur, rub, or gallops.   BACK: No kyphosis or scoliosis; no CVA tenderness ABDOMEN: soft and non-tender; no masses, no organomegaly, normal abdominal bowel sounds; no pannus; no intertriginous candida. There is no rebound and no distention. Rectal Exam: Not done EXTREMITIES: No bone or joint deformity; age-appropriate arthropathy of the hands and knees; no edema; no ulcerations.  There is no calf tenderness. Genitalia: not examined PULSES: 2+ and symmetric SKIN: Normal hydration no rash or ulceration CNS: Cranial nerves 2-12 grossly intact no focal lateralizing neurologic deficit.  Speech is fluent; uvula  elevated with phonation, facial symmetry and tongue midline. DTR are normal bilaterally, cerebella exam is intact, barbinski is negative and strengths are equaled bilaterally.  No sensory loss.   Labs on Admission:  Basic Metabolic Panel:  Recent Labs Lab 06/24/15 0808  NA 143  K 3.5  CL 118*  CO2 18*  GLUCOSE 88  BUN 20  CREATININE 1.46*  CALCIUM 7.0*   Liver Function Tests:  Recent Labs Lab 06/24/15 0808  AST 27  ALT 21  ALKPHOS 84  BILITOT 0.6  PROT 6.5  ALBUMIN 3.2*   CBC:  Recent Labs Lab 06/24/15 0808  WBC 12.6*  NEUTROABS 9.5*  HGB 10.2*  HCT 32.7*  MCV 94.8  PLT 167   Cardiac Enzymes: No results for input(s): CKTOTAL, CKMB, CKMBINDEX, TROPONINI in the last 168 hours.  CBG: No results for input(s): GLUCAP in the last 168 hours.   Radiological Exams on Admission: Ct Head Wo Contrast  06/24/2015   CLINICAL DATA:  Pain following fall  EXAM: CT HEAD WITHOUT CONTRAST  CT CERVICAL SPINE WITHOUT CONTRAST  TECHNIQUE: Multidetector CT imaging of the head and cervical spine was performed following the standard protocol without intravenous contrast. Multiplanar CT image reconstructions of the cervical spine were also generated.  COMPARISON:  Cervical MRI April 29, 2015.  Head CT Feb 04, 2014  FINDINGS: CT HEAD FINDINGS  There is mild diffuse atrophy. There is no intracranial mass, hemorrhage, extra-axial fluid collection, or midline shift. There is patchy small vessel disease in the centra semiovale bilaterally. There is decreased attenuation in the inferomedial right occipital lobe consistent with prior infarct, not apparent on prior study. There is a small focus of decreased attenuation in the head of the caudate nucleus on the right, consistent with a prior small lacunar infarct in this area. On the current  examination, there is rather subtle decreased attenuation at the left frontal -parietal junction which may represent an early infarct. No other evidence suggesting  potential acute infarct. Bony calvarium appears intact. Mastoid air cells on the right are clear. There is opacification of several ethmoid air cells on the left.  CT CERVICAL SPINE FINDINGS  There is no fracture. There is slight retrolisthesis of C3 on C4. There is slight anterolisthesis of C5 on C6. There is slight retrolisthesis of C6 on C7. These areas of spondylolisthesis are felt to be due to underlying spondylosis. Prevertebral soft tissues and predental space regions are normal. There is marked disc space narrowing at C3-4, C6-7, and C7-T1. There is moderate disc space narrowing at C2-3 and C5-6. There is facet hypertrophy to varying degrees at most levels. There is broad-based disc protrusion exacerbated by the spondylolisthesis at C3-4. There is slight central stenosis at this level due to these findings. There is marked exit foraminal narrowing at C6-7 and C7-T1 on the right. There is moderate narrowing of the canal at C5-6 on the right due to bony hypertrophy.  There is calcification in each carotid artery. There is scarring in each lung apex.  IMPRESSION: CT head: Question early infarct left frontal -parietal junction. This finding is best appreciated on axial slices 23 and 24 series 2. There is evidence of a prior infarct in the inferomedial right occipital lobe. There is a tiny prior infarct in the head of the caudate nucleus on the right. There is atrophy with mild periventricular small vessel disease. There is no hemorrhage or mass effect. There is left-sided mastoid air cell disease, not present previously.  CT cervical spine: Multilevel arthropathy. Areas of mild spondylolisthesis at several levels, felt to be due to underlying spondylosis. No acute fracture. Calcification in each carotid artery.   Electronically Signed   By: Bretta Bang III M.D.   On: 06/24/2015 08:54   Ct Cervical Spine Wo Contrast  06/24/2015   CLINICAL DATA:  Pain following fall  EXAM: CT HEAD WITHOUT CONTRAST  CT  CERVICAL SPINE WITHOUT CONTRAST  TECHNIQUE: Multidetector CT imaging of the head and cervical spine was performed following the standard protocol without intravenous contrast. Multiplanar CT image reconstructions of the cervical spine were also generated.  COMPARISON:  Cervical MRI April 29, 2015.  Head CT Feb 04, 2014  FINDINGS: CT HEAD FINDINGS  There is mild diffuse atrophy. There is no intracranial mass, hemorrhage, extra-axial fluid collection, or midline shift. There is patchy small vessel disease in the centra semiovale bilaterally. There is decreased attenuation in the inferomedial right occipital lobe consistent with prior infarct, not apparent on prior study. There is a small focus of decreased attenuation in the head of the caudate nucleus on the right, consistent with a prior small lacunar infarct in this area. On the current examination, there is rather subtle decreased attenuation at the left frontal -parietal junction which may represent an early infarct. No other evidence suggesting potential acute infarct. Bony calvarium appears intact. Mastoid air cells on the right are clear. There is opacification of several ethmoid air cells on the left.  CT CERVICAL SPINE FINDINGS  There is no fracture. There is slight retrolisthesis of C3 on C4. There is slight anterolisthesis of C5 on C6. There is slight retrolisthesis of C6 on C7. These areas of spondylolisthesis are felt to be due to underlying spondylosis. Prevertebral soft tissues and predental space regions are normal. There is marked disc space narrowing at C3-4, C6-7, and  C7-T1. There is moderate disc space narrowing at C2-3 and C5-6. There is facet hypertrophy to varying degrees at most levels. There is broad-based disc protrusion exacerbated by the spondylolisthesis at C3-4. There is slight central stenosis at this level due to these findings. There is marked exit foraminal narrowing at C6-7 and C7-T1 on the right. There is moderate narrowing of the  canal at C5-6 on the right due to bony hypertrophy.  There is calcification in each carotid artery. There is scarring in each lung apex.  IMPRESSION: CT head: Question early infarct left frontal -parietal junction. This finding is best appreciated on axial slices 23 and 24 series 2. There is evidence of a prior infarct in the inferomedial right occipital lobe. There is a tiny prior infarct in the head of the caudate nucleus on the right. There is atrophy with mild periventricular small vessel disease. There is no hemorrhage or mass effect. There is left-sided mastoid air cell disease, not present previously.  CT cervical spine: Multilevel arthropathy. Areas of mild spondylolisthesis at several levels, felt to be due to underlying spondylosis. No acute fracture. Calcification in each carotid artery.   Electronically Signed   By: Bretta Bang III M.D.   On: 06/24/2015 08:54   US Carotid Bilateral  06/25/2015   CLINICAL DATA:  stroke, hypertension, coronary disease, diabetes, previous tobacco abuse  EXAM: BILATERAL CAROTID DUPLEX ULTRASOUND  TECHNIQUE: Wallace Cullens scale imaging, color Doppler and duplex ultrasound was performed of bilateral carotid and vertebral arteries in the neck.  COMPARISON:  07/27/2013  REVIEW OF SYSTEMS: Quantification of carotid stenosis is based on velocity parameters that correlate the residual internal carotid diameter with NASCET-based stenosis levels, using the diameter of the distal internal carotid lumen as the denominator for stenosis measurement.  The following velocity measurements were obtained:  PEAK SYSTOLIC/END DIASTOLIC  RIGHT  ICA:                     96/26cm/sec  CCA:                     75/14cm/sec  SYSTOLIC ICA/CCA RATIO:  1.3  DIASTOLIC ICA/CCA RATIO: 1.8  ECA:                     87cm/sec  LEFT  ICA:                     67/18cm/sec  CCA:                     61/11cm/sec  SYSTOLIC ICA/CCA RATIO:  1.1  DIASTOLIC ICA/CCA RATIO: 1.7  ECA:                     63cm/sec  FINDINGS:  RIGHT CAROTID ARTERY: Eccentric partially calcified plaque in the bulb extending to the origins of the internal and external carotid arteries. No high-grade stenosis. Normal waveforms and color Doppler signal.  RIGHT VERTEBRAL ARTERY:  Normal flow direction and waveform.  LEFT CAROTID ARTERY: Intimal thickening through the common carotid artery. Circumferential partially calcified plaque at the bifurcation extending into the proximal ICA resulting in at least mild stenosis. Normal waveforms and color Doppler signal.  LEFT VERTEBRAL ARTERY: Normal flow direction and waveform.  IMPRESSION: 1. Bilateral carotid bifurcation and proximal ICA plaque, resulting in less than 50% diameter stenosis. The exam does not exclude plaque ulceration or embolization. Continued surveillance recommended.   Electronically Signed  By: Corlis Leak M.D.   On: 06/25/2015 10:00   Assessment/Plan Present on Admission:  . Stroke (HCC) . TIA (transient ischemic attack) . Peripheral vascular disease, unspecified (HCC) . ILD (interstitial lung disease) (HCC) . Hypothyroidism . Hypertension . Hyperlipidemia . Rheumatoid arthritis (HCC)  PLAN:  Acute CVA:  Will complete the CVA work up.  ASA was changed to Plavix.  I will consult cardiology to see if we can arrange for a right heart cath to define his pulmonary hypertension.  He would like to go home tomorrow, so we will plan to discharge him after all the testings were completed, and hopefully cardiology can make recommendation.  He is stable.  Thank you.   Other plans as per orders.  Code Status: FULL Unk Lightning, MD. [facility] Pager [phone] 7pm to 7am.  06/25/2015, 1:08 PM

## 2015-06-26 ENCOUNTER — Inpatient Hospital Stay (HOSPITAL_COMMUNITY): Payer: Medicare Other

## 2015-06-26 ENCOUNTER — Ambulatory Visit (HOSPITAL_COMMUNITY): Payer: Medicare Other

## 2015-06-26 DIAGNOSIS — F17201 Nicotine dependence, unspecified, in remission: Secondary | ICD-10-CM

## 2015-06-26 DIAGNOSIS — I25812 Atherosclerosis of bypass graft of coronary artery of transplanted heart without angina pectoris: Secondary | ICD-10-CM

## 2015-06-26 DIAGNOSIS — R0609 Other forms of dyspnea: Secondary | ICD-10-CM

## 2015-06-26 DIAGNOSIS — I639 Cerebral infarction, unspecified: Secondary | ICD-10-CM

## 2015-06-26 DIAGNOSIS — I739 Peripheral vascular disease, unspecified: Secondary | ICD-10-CM

## 2015-06-26 DIAGNOSIS — J849 Interstitial pulmonary disease, unspecified: Secondary | ICD-10-CM

## 2015-06-26 DIAGNOSIS — M069 Rheumatoid arthritis, unspecified: Secondary | ICD-10-CM

## 2015-06-26 DIAGNOSIS — I10 Essential (primary) hypertension: Secondary | ICD-10-CM

## 2015-06-26 DIAGNOSIS — E785 Hyperlipidemia, unspecified: Secondary | ICD-10-CM

## 2015-06-26 LAB — HEMOGLOBIN A1C
Hgb A1c MFr Bld: 6 % — ABNORMAL HIGH (ref 4.8–5.6)
MEAN PLASMA GLUCOSE: 126 mg/dL

## 2015-06-26 MED ORDER — ASPIRIN EC 325 MG PO TBEC: 325.0000 mg | DELAYED_RELEASE_TABLET | Freq: Every day | ORAL | Status: DC

## 2015-06-26 MED ORDER — CLOPIDOGREL BISULFATE 75 MG PO TABS: 75.0000 mg | ORAL_TABLET | Freq: Every day | ORAL | Status: DC

## 2015-06-26 NOTE — Consult Note (Signed)
CARDIOLOGY CONSULT NOTE   Patient ID: [name] MRN: [id number] DOB/AGE: 70-May-1947 70 y.o.  Admit Date: 06/24/2015 Referring Physician: PTH Primary Physician: Lubertha South, MD Consulting Cardiologist: Prentice Docker MD Primary Cardiologist Dina Rich MD Reason for Consultation: Need for right heart cath.   Clinical Summary [name] is a 69 y.o.male with known history of CAD, CABG (LIMA to LAD, SVG to OM, SVG to RCA) Last cath in 2009 demonstrated patent grafts with the exception of occluded SVG to RCA, PAD, Hyperlipidemia, and hypertension.   He was admitted with acute left frontal parietal CVA, with symptoms of right sided facial weakness with drooping, and inability to move his right side. He felt some tingling and experienced ataxia of the right arm on Friday, took a baby ASA and symptoms resolved. He awoke the next morning and was experiencing severe weakness on the right side, and fell. Called EMS.   He has since been started on Plavix, and ASA was stopped by PTH. Doppler ultrasound of the carotid arteries demonstrated Bilateral carotid bifurcation and proximal ICA plaque, resulting in less than 50% diameter stenosis. He takes methotrexate for RA and this had been discontinued.  We are asked for recommendations on need for RHC due to elevated pulmonary pressures, as documented by [facility] in progress notes dated 06/22/2015. He follows him for pulmonary fibrosis.   :Concern new onset and worsening dyspnea is worsening interstitial lung disease and/or pulmonary hypertension as evidenced with pulmonary artery systolic pressure of 39 onon July 2016. He needs evaluation for both. We'll get walking desaturation test to the extent possible, overnight oxygen test on room air, high-resolution CT scan of the chest and will request his cardiologist Dr. branch to set up a right heart catheterization."   He has not yet been seen by Dr. Wyline Mood and has been admitted for above  diagnosis. Echo completed this admission, demonstrated Grade II diastolic dysfunction and PA pressure of 36 mm Hg.    Allergies  Allergen Reactions  . Clindamycin/Lincomycin Other (See Comments)    Kidney Failure  . Amoxicillin Hives and Other (See Comments)    Hallucinations   . Ciprofloxacin Other (See Comments)    C-diff  . Other Hives, Diarrhea and Other (See Comments)    "Mycin" Causes C-diff  . Doxycycline Nausea And Vomiting  . Levofloxacin Nausea And Vomiting    Medications Scheduled Medications: . atenolol  25 mg Oral BID  . atorvastatin  80 mg Oral Daily  . clopidogrel  75 mg Oral Daily  . finasteride  5 mg Oral Daily  . heparin  5,000 Units Subcutaneous 3 times per day  . leflunomide  40 mg Oral Daily  . levothyroxine  88 mcg Oral QAC breakfast  . predniSONE  10 mg Oral Daily  . tamsulosin  0.4 mg Oral Daily  . Vitamin D (Ergocalciferol)  50,000 Units Oral Q7 days    Infusions:    PRN Medications: ALPRAZolam, HYDROcodone-acetaminophen, senna-docusate   Past Medical History  Diagnosis Date  . 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 Hyperlipidemia   . Hypertension   . PVD (peripheral vascular disease) (HCC)     Left iliac PCI/stent  . Cerebrovascular disease   . Tobacco abuse, in remission     Remote  . GERD (gastroesophageal reflux disease)   . Hypothyroid   . Allergic rhinitis   . Chronic lung disease     ?  Rheumatoid lung; ?  Adverse reaction to methotrexate  .  Schatzki's ring   . Hx of Clostridium difficile infection     Recurrent; associated 40 pound weight loss  . Achalasia   . Diabetes mellitus     borderline no meds  . Atrial fibrillation (HCC)   . Chronic back pain   . 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 Cervical spondylosis   . DDD (degenerative disc disease), lumbar   . Pneumonia   . Carotid artery occlusion   . Nephrolithiasis 2010    s/p lithotripsy  . Hiatal hernia     Past Surgical  History  Procedure Laterality Date  . Coronary artery bypass graft      x6 In 1998  . Total knee arthroplasty      Right  . Ankle fusion      Left  . Wrist fusion      Right  . Shoulder surgery    . Colonoscopy  09/12/2011    Dr. Elly Modena hemorrhoids, normal colon and distal terminal ileum. Random bx negative. Stool for CDiff positive.  . Esophagogastroduodenoscopy  09/13/2011    Dr. Lyndle Herrlich plawues mid-esophagus KOH negative. Distal esophageal ring and ulcer. Mild gastritis. duodenal diverticulum. Savaory dilation 58mm.   Gaspar Bidding dilation  09/13/2011  . Esophageal manometry  09/30/2011    Procedure: ESOPHAGEAL MANOMETRY (EM);  Surgeon: Rob Bunting, MD;  Location: [facility];  Service: Endoscopy;  Laterality: N/A;  . Esophagogastroduodenoscopy  09/12/2011    Dr. Rinaldo Ratel rings s/p dilation  . Cardiac surgery    . Esophagomyotomy  11/12/11    [facility]- with DOR antireflux surgery  . Esophagogastroduodenoscopy  06/25/2012    Procedure: ESOPHAGOGASTRODUODENOSCOPY (EGD);  Surgeon: Corbin Ade, MD;  Location: [facility] SUITE;  Service: Endoscopy;  Laterality: N/A;  7:30  . Savory dilation  06/25/2012    Procedure: SAVORY DILATION;  Surgeon: Corbin Ade, MD;  Location: [facility];  Service: Endoscopy;  Laterality: N/A;  Elease Hashimoto dilation  06/25/2012    Procedure: Elease Hashimoto DILATION;  Surgeon: Corbin Ade, MD;  Location: [facility];  Service: Endoscopy;  Laterality: N/A;  . Ankle fusion  09/01/2012    Procedure: ANKLE FUSION;  Surgeon: Valeria Batman, MD;  Location: [facility] OR;  Service: Orthopedics;  Laterality: Left;  Take down of angulated tibial/fibula Fractures  . Femoral-tibial bypass graft Left 01/28/2013    Procedure: BYPASS GRAFT FEMORAL-TIBIAL ARTERY;  Surgeon: Nada Libman, MD;  Location: [facility] OR;  Service: Vascular;  Laterality: Left;  Ultrasound Guided  . Amputation Left 01/28/2013    Procedure: AMPUTATION LEFT 5TH TOE;  Surgeon: Nada Libman, MD;   Location: [facility] OR;  Service: Vascular;  Laterality: Left;  . Spine surgery  11-03-13  . Abdominal aortagram N/A 01/01/2013    Procedure: ABDOMINAL Ronny Flurry;  Surgeon: Sherren Kerns, MD;  Location: [facility] CATH LAB;  Service: Cardiovascular;  Laterality: N/A;  . Abdominal aortagram N/A 10/05/2013    Procedure: ABDOMINAL AORTAGRAM;  Surgeon: Nada Libman, MD;  Location: [facility] CATH LAB;  Service: Cardiovascular;  Laterality: N/A;  . Lower extremity angiogram Left 10/05/2013    Procedure: LOWER EXTREMITY ANGIOGRAM;  Surgeon: Nada Libman, MD;  Location: [facility] CATH LAB;  Service: Cardiovascular;  Laterality: Left;  . Abdominal aortagram N/A 11/01/2014    Procedure: ABDOMINAL Ronny Flurry;  Surgeon: Nada Libman, MD;  Location: [facility] CATH LAB;  Service: Cardiovascular;  Laterality: N/A;  . Joint replacement Right 1999    Knee  . Toe amputation Left 09/05/2014  Removed left fifth toe  . Cataract extraction w/phaco Left 12/22/2014    Procedure: CATARACT EXTRACTION PHACO AND INTRAOCULAR LENS PLACEMENT (IOC);  Surgeon: Gemma Payor, MD;  Location: [facility] ORS;  Service: Ophthalmology;  Laterality: Left;  CDE:11.89  . Cataract extraction w/phaco Right 01/30/2015    Procedure: CATARACT EXTRACTION PHACO AND INTRAOCULAR LENS PLACEMENT RIGHT EYE;  Surgeon: Gemma Payor, MD;  Location: [facility] ORS;  Service: Ophthalmology;  Laterality: Right;  CDE:10.56  . Eye surgery Left December 22, 2014    Cataract  . Eye surgery Right December 31, 2014    Cataract    Family History  Problem Relation Age of Onset  . Stomach cancer Mother 80  . Cancer Mother     Stomach  . Heart disease Mother   . Heart attack Mother   . Heart attack Father   . Heart disease Father   . Heart disease Brother   . Leukemia Brother   . Lung disease Son     infant  . Lung disease Daughter     infant    Social History Mr. Shadd reports that he quit smoking about 49 years ago. His smoking use included Cigarettes. He started smoking about 58 years ago. He has a  2.5 pack-year smoking history. He has quit using smokeless tobacco. Mr. Kossman reports that he does not drink alcohol.  Review of Systems Complete review of systems are found to be negative unless outlined in H&P above.  Physical Examination Blood pressure 128/78, pulse 96, temperature 97.8 F (36.6 C), temperature source Oral, resp. rate 17, height 5\' 8"  (1.727 m), weight 137 lb 6.4 oz (62.324 kg), SpO2 96 %.  Intake/Output Summary (Last 24 hours) at 06/26/15 1051 Last data filed at 06/26/15 0900  Gross per 24 hour  Intake    720 ml  Output    300 ml  Net    420 ml    Telemetry: SR with PAC's.   GEN: No acute distress  HEENT: Conjunctiva and lids normal, oropharynx clear with moist mucosa. Neck: Supple, no elevated JVP or carotid bruits, no thyromegaly. Lungs: Clear to auscultation,crackles in the bases Cardiac: Regular rate and rhythm, with occasional irregular beat. no S3 or significant systolic murmur, no pericardial rub. Abdomen: Soft, nontender, no hepatomegaly, bowel sounds present, no guarding or rebound. Extremities: No pitting edema, distal pulses 2+.Mild edema of the left ankle.  Skin: Warm and dry. Musculoskeletal: No kyphosis. Neuropsychiatric: Alert and oriented x3, affect grossly appropriate. Significant weakness in the right leg, but resolved weakness in the right hand and arm.   Prior Cardiac Testing/Procedures Echo: 06/25/2015 Left ventricle: The cavity size was normal. There was moderate concentric hypertrophy. Systolic function was normal. The estimated ejection fraction was in the range of 60% to 65%. Wall motion was normal; there were no regional wall motion abnormalities. Features are consistent with a pseudonormal left ventricular filling pattern, with concomitant abnormal relaxation and increased filling pressure (grade 2 diastolic dysfunction). Doppler parameters are consistent with high ventricular filling pressure. - Aortic valve:  Moderate thickening and calcification, consistent with sclerosis. - Mitral valve: Calcified annulus. There was trivial regurgitation. - Left atrium: The atrium was moderately dilated. - Right atrium: The atrium was mildly dilated. - Pulmonic valve: There was trivial regurgitation. - Pulmonary arteries: PA peak pressure: 36 mm Hg (S).  10/2007 Cath  HEMODYNAMIC RESULTS: Left ventricle 139/16 mmHg. Aorta 136/67 mmHg.  ANGIOGRAPHIC FINDINGS:  1. Left main coronary artery is severely diseased to approximately 95%  in  diffuse fashion. This has progressed compared to the previous  angiogram. This vessel gives rise to the left anterior descending,  a very small ramus intermedius, and circumflex vessels.  2. The left anterior descending is occluded proximally.  3. The circumflex coronary artery is occluded proximally.  4. The right coronary artery is occluded proximally just after the  takeoff of a right ventricular marginal branch. There are some  right to right bridging collaterals noted filling the right  coronary artery nearly down to the distal portion.  5. The saphenous vein graft to the first and second obtuse marginals  is widely patent. There is an area of 30-40% stenosis within the  proximal vessel although not clearly flow-limiting. The  anastomotic sites look to be intact.  6. There is known occlusion of the saphenous vein graft of the right  coronary system.  7. The LIMA to the left anterior descending and diagonal is widely  patent. Anastomotic sites are intact. Of note, the distal left  anterior descending is diffusely diseased, and there is an apical  90% stenosis noted. The diagonal branch is also diffusely diseased  and relatively small.  8. There is some left-to-right collateralization of the distal right  coronary artery system, although not well-developed.  Ventriculography is performed in the RAO projection and reveals an  ejection fraction  of approximately 60% with no significant wall motion  abnormality and no mitral regurgitation.  DIAGNOSES:  1. Severe native coronary artery disease as outlined. There has been  progression in left main disease, although continued occlusion  proximally of the left anterior descending and circumflex vessels.  The distal left anterior descending is diffusely diseased, and  there is some left-to-right collateralization of the occluded right  coronary artery although not to a large degree.  2. Patent left internal mammary artery to left anterior descending and  diagonal, patent saphenous vein graft to the first and second  obtuse marginal with 30-40% proximal vein graft stenosis (not  clearly flow-limiting), and known occlusion of the saphenous vein  graft to posterior descending and right coronary artery.  3. Left ventricular ejection fraction of approximately 60% with no  large focal wall motion abnormality and a left ventricular end-  diastolic pressure of 16 mmHg. No significant mitral regurgitation  is noted.  DISCUSSION: No obvious revascularization options noted at this time.  Suspect that angina may well be related to progression in the patient's  native disease rather than new hemodynamically significant occlusions  within his remaining 2 bypass grafts which are widely patent. There is  30-40% stenosis within the saphenous vein graft of the obtuse marginal  system, although this does not appear to be flow-limiting. At this  point, medical therapy seems to be the best option. I discussed this  with the patient, his family, and with Dr. [name].   04/2013 ABI: right 1.39 left 1.46  Jan 2015 LE Angiogram  Findings:  Aortogram: No significant suprarenal aortic stenosis is identified. There is no evidence of renal artery stenosis. The infrarenal abdominal aorta is widely patent. The stent within the left common iliac artery is widely patent. The left external  iliac artery is widely patent. The right iliac system is widely patent.  Left Lower Extremity: The left common femoral and profunda femoral arteries are widely patent. The left superficial femoral artery is patent down to the adductor canal where it occludes. There is a bypass graft originating from the distal superficial femoral artery. The proximal anastomosis is widely patent. The  vein bypass graft is widely patent. The distal anastomosis shows mild narrowing and just beyond the anastomosis is a high-grade stenosis of approximately 80%. The posterior tibial artery isn't patent down across the ankle.  Intervention: After the above images were acquired, the decision was made to proceed with intervention. Over an 035 wire, a 6 French sheath was advanced into the left superficial femoral artery. The patient was fully heparinized. A [place] core wire was easily advanced across the distal anastomosis. I selected a 3 x 20 Fox SV balloon and perform primary balloon angioplasty of the distal anastomosis and posterior tibial artery. The balloon was taken 14 atmospheres for 2 minutes. Completion angiography revealed resolution of the stenosis. At this point catheters and wires were removed. The sheath was withdrawn to the right external iliac artery. The patient was taken to the holding area for sheath pull once his coagulation profile corrects.  Impression:  #1 high-grade stenosis at the distal anastomosis and in the native posterior tibial artery just beyond the distal anastomosis. This was successfully dilated using a 3 x 20 balloon with excellent results.  Lab Results  Basic Metabolic Panel:  Recent Labs Lab 06/24/15 0808  NA 143  K 3.5  CL 118*  CO2 18*  GLUCOSE 88  BUN 20  CREATININE 1.46*  CALCIUM 7.0*    Liver Function Tests:  Recent Labs Lab 06/24/15 0808  AST 27  ALT 21  ALKPHOS 84  BILITOT 0.6  PROT 6.5  ALBUMIN 3.2*    CBC:  Recent Labs Lab 06/24/15 0808  WBC 12.6*    NEUTROABS 9.5*  HGB 10.2*  HCT 32.7*  MCV 94.8  PLT 167    Cardiac Enzymes: No results for input(s): CKTOTAL, CKMB, CKMBINDEX, TROPONINI in the last 168 hours.  BNP: Invalid input(s): POCBNP   Radiology: Mr Shirlee Latch Wo Contrast  06/26/2015   CLINICAL DATA:  Left MCA territory stroke.  EXAM: MRA HEAD WITHOUT CONTRAST  TECHNIQUE: Angiographic images of the Circle of Willis were obtained using MRA technique without intravenous contrast.  COMPARISON:  MRI brain 06/26/2015.  FINDINGS: Internal carotid arteries are within normal limits from the high cervical segments through the ICA termini bilaterally. Mild segmental narrowing is present in the right A1 segment. The left A1 segment is intact. The M1 segments are normal bilaterally. The anterior communicating artery is patent. The MCA bifurcations are intact. There is moderate narrowing of the anterior right M2 division with prominent segmental narrowing of branch vessels. The left MCA bifurcation is intact with severe is segmental stenoses.  The left vertebral artery is slightly dominant to the right. PICA origins are visualized and normal bilaterally. The basilar artery is within normal limits. More distal PICA stenosis is present bilaterally. Both posterior cerebral arteries originate from the basilar tip. There is moderate attenuation of distal PCA branch vessels.  IMPRESSION: 1. Severe segmental stenoses within left greater than right MCA branch vessels. 2. Mild atherosclerotic changes in the right A1 segment without a significant focal stenosis. 3. Moderate distal PICA territory stenosis bilaterally compatible with the acute infarcts. 4. Mild distal PCA small vessel disease bilaterally.   Electronically Signed   By: Marin Roberts M.D.   On: 06/26/2015 10:20   Mr Brain Wo Contrast  06/26/2015   CLINICAL DATA:  Left CVA. Right-sided weakness and numbness beginning 2 days ago. Fall in living room at the patient's home 2 nights ago.  EXAM:  MRI HEAD WITHOUT CONTRAST  TECHNIQUE: Multiplanar, multiecho pulse sequences of the  brain and surrounding structures were obtained without intravenous contrast.  COMPARISON:  CT of the head without contrast 06/24/2015.  FINDINGS: The diffusion-weighted images confirm an acute nonhemorrhagic infarct involving the left precentral and postcentral gyrus. Two punctate nonhemorrhagic infarcts are evident within the left cerebellum. No other acute infarct is evident.  Remote lacunar infarcts are present within the basal ganglia bilaterally including the right caudate head. Bilateral periventricular and scattered subcortical T2 changes are present bilaterally. A remote medial right occipital infarct is evident. White matter changes extend into the brainstem.  The internal auditory canal is within normal limits bilaterally. Flow is present in the major intracranial arteries. Bilateral lens replacements are present. The globes and orbits are otherwise intact.  Mild mucosal thickening is present in the left maxillary sinus and scattered throughout the ethmoid air cells. The paranasal sinuses are otherwise clear. A left mastoid effusion is present. No obstructing nasopharyngeal lesion is evident.  IMPRESSION: 1. Nonhemorrhagic left MCA acute/subacute infarct involving the pre and post central gyrus. 2. Acute nonhemorrhagic punctate infarcts in the left cerebellum. 3. Remote nonhemorrhagic infarct of the medial right occipital lobe. 4. Age advanced atrophy and white matter disease, likely reflecting the sequela of chronic microvascular ischemia.   Electronically Signed   By: Marin Roberts M.D.   On: 06/26/2015 10:16   US Carotid Bilateral  06/25/2015   CLINICAL DATA:  stroke, hypertension, coronary disease, diabetes, previous tobacco abuse  EXAM: BILATERAL CAROTID DUPLEX ULTRASOUND  TECHNIQUE: Wallace Cullens scale imaging, color Doppler and duplex ultrasound was performed of bilateral carotid and vertebral arteries in the neck.   COMPARISON:  07/27/2013  REVIEW OF SYSTEMS: Quantification of carotid stenosis is based on velocity parameters that correlate the residual internal carotid diameter with NASCET-based stenosis levels, using the diameter of the distal internal carotid lumen as the denominator for stenosis measurement.  The following velocity measurements were obtained:  PEAK SYSTOLIC/END DIASTOLIC  RIGHT  ICA:                     96/26cm/sec  CCA:                     75/14cm/sec  SYSTOLIC ICA/CCA RATIO:  1.3  DIASTOLIC ICA/CCA RATIO: 1.8  ECA:                     87cm/sec  LEFT  ICA:                     67/18cm/sec  CCA:                     61/11cm/sec  SYSTOLIC ICA/CCA RATIO:  1.1  DIASTOLIC ICA/CCA RATIO: 1.7  ECA:                     63cm/sec  FINDINGS: RIGHT CAROTID ARTERY: Eccentric partially calcified plaque in the bulb extending to the origins of the internal and external carotid arteries. No high-grade stenosis. Normal waveforms and color Doppler signal.  RIGHT VERTEBRAL ARTERY:  Normal flow direction and waveform.  LEFT CAROTID ARTERY: Intimal thickening through the common carotid artery. Circumferential partially calcified plaque at the bifurcation extending into the proximal ICA resulting in at least mild stenosis. Normal waveforms and color Doppler signal.  LEFT VERTEBRAL ARTERY: Normal flow direction and waveform.  IMPRESSION: 1. Bilateral carotid bifurcation and proximal ICA plaque, resulting in less than 50% diameter stenosis. The exam does not exclude plaque  ulceration or embolization. Continued surveillance recommended.   Electronically Signed   By: Corlis Leak M.D.   On: 06/25/2015 10:00     ECG: NSR with PAC's infero/anterior Q waves.    Impression and Recommendations  1. Acute left frontal parietal CVA: Non-hemorhagic. Has been placed on Plavix by PTH. He has been taken off of ASA. Would restart in the setting of CAD. Neuro to follow. Has regained use of right arm and hand, still with significant weakness  of right leg. PT seeing.   2. Pulmonary fibrosis: Questionable RA association: PA pressures are not significantly elevated per echo. Will delay recommendations for RHC until he recovers from CVA. Follow up appointment with Dr. Wyline Mood at later date.   3. CAD: CABG (LIMA to LAD, SVG to OM, SVG to RCA) Last cath in 2009 demonstrated patent grafts with the exception of occluded SVG to RCA, Restart ASA. Continue statin therapy.   4. PAD: PTCA of distal anastomosis and in the native posterior tibial artery just beyond the distal anastomosis. This was successfully dilated using a 3 x 20 balloon  Distal pulses are diminished.     Signed: Bettey Mare. Lawrence NP [facility]  06/26/2015, 10:51 AM Co-Sign MD  The patient was seen and examined, and I agree with the assessment and plan as documented by K. Lawrence NP, with modifications as noted below. 69 yr old male admitted with acute CVA and followed by both cardiology (sees Dr. Wyline Mood for CAD and h/o CABG as well as PAD) and pulmonology in outpatient setting.  Has been evaluated by pulmonary for significant progressive exertional dyspnea. Echocardiogram on 06/25/15 demonstrated mild pulmonary hypertension (36 mmHg), normal LV systolic function, and grade 2 diastolic dysfunction with high filling pressures. Moderate diastolic dysfunction and elevated LV filling pressures are also likely contributing to mild pulmonary hypertension. He had been on methotrexate for at least 10 years for the treatment of rheumatoid arthritis, both of which can lead to interstitial lung disease and consequent pulmonary hypertension. Dr. Marchelle Gearing had previously been in touch with Dr. Wyline Mood about pursuing right heart catheterization for further quantification and assessment of pulmonary hypertension, which was already in the process of being arranged. I feel he can safely proceed with this but there is no need to transfer him to [facility] for this at present, and can be obtained as  an outpatient. I described the procedure to the patient, his wife, and additional family members. He can follow up with Dr. Wyline Mood for further management.

## 2015-06-26 NOTE — Evaluation (Signed)
Occupational Therapy Screen Patient Details Name: AMISH MINTZER MRN: [id number] DOB: 23-Apr-1946 Today's Date: 06/26/2015    History of Present Illness Xylan D Woolum is an 69 y.o. male with hx of HTN on ACE-I, RA on arava and chronic steroid ( 10mg  for over 10 years), BPH, chronic back pain, hypothyroidism, presented to the ER after at least 13 hours since ictus of right sided weakness with no facial droop or aphasia. He was having intermittent symptoms, as yesterday, he was not able to move his right side. He had no slurred speech or neck pain. His symptoms have improved. 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 Cervical CT showed multilevel arthrospathy. His serology showed Cr of 1.4 and Hb of 10 g per dL. His EKG showed NSR. He has been taking ASA 81 mg faithfully.    Clinical Impression   Spoke with PT prior to meeting with patient. Pt has severe RA affecting BUE and is at baseline. Spoke with patient regarding any UB weakness or coordination issues. Patient feels that strength has increased in RUE. Family feels that coordination on right side is still showing deficits. Patient was given handout regarding fine motor coordination activities to complete independently at home. Patient does not feel that he is at baseline as long as the CVA is concerned. No further OT needs at this time.     Follow Up Recommendations  No OT follow up    Equipment Recommendations  None recommended by OT    Recommendations for Other Services       Precautions / Restrictions Precautions Precautions: Fall Restrictions Weight Bearing Restrictions: No      Mobility  Transfers         General transfer comment: Per PT evaluation, patient requires [place] to transfer with roller walker.                           Pertinent Vitals/Pain Pain Assessment: No/denies pain     Hand Dominance Right (Right hand/wrist are very deformed and usually patient uses left hand has dominant)   Extremity/Trunk Assessment Upper Extremity Assessment Upper Extremity Assessment: Overall WFL for tasks assessed (Pt has severe RA with UB deformity which is baseline. Equal strength on Left and RIght side. Mild right coordination deficits.)   Lower Extremity Assessment Lower Extremity Assessment: Defer to PT evaluation   Cervical / Trunk Assessment Cervical / Trunk Assessment: Kyphotic   Communication Communication Communication: No difficulties   Cognition Arousal/Alertness: Awake/alert Behavior During Therapy: WFL for tasks assessed/performed Overall Cognitive Status: Within Functional Limits for tasks assessed                                Home Living Family/patient expects to be discharged to:: Private residence Living Arrangements: Spouse/significant other Available Help at Discharge: Family Type of Home: House Home Access: Ramped entrance     Home Layout: Able to live on main level with bedroom/bathroom     Bathroom Shower/Tub: Tub/shower unit (Pt sponge bathes)   Bathroom Toilet: Standard Bathroom Accessibility: Yes   Home Equipment: Walker - 2 wheels;Cane - single point;Wheelchair - power   Additional Comments: sleeps in a recliner      Prior  Functioning/Environment Level of Independence: Needs assistance  Gait / Transfers Assistance Needed: usually sits in a rolling desk chair and rolls himself around...he is able to ambulate short distances and occsionally uses a cane...family state that he is very unsafe with gait and should be using a walker ADL's / Homemaking Assistance Needed: independent with bathing and dressing (needs help with buttons)...wife does all homemaking         End of Session    Activity Tolerance: Patient tolerated treatment  well Patient left: in bed;with call bell/phone within reach;with nursing/sitter in room;with family/visitor present    Limmie Patricia, OTR/L,CBIS  [phone]  06/26/2015, 11:42 AM

## 2015-06-26 NOTE — Evaluation (Signed)
Physical Therapy Evaluation Patient Details Name: DOCTOR SHEAHAN MRN: [id number] DOB: 07-29-1946 Today's Date: 06/26/2015   History of Present Illness  [name] is an 69 y.o. male with hx of HTN on ACE-I, RA on arava and chronic steroid ( 73m for over 10 years), BPH, chronic back pain, hypothyroidism, presented to the ER after at least 13 hours since ictus of right sided weakness with no facial droop or aphasia. He was having intermittent symptoms, as yesterday, he was not able to move his right side. He had no slurred speech or neck pain. His symptoms have improved. Cervical CT showed multilevel arthrospathy. His serology showed Cr of 1.4 and Hb of 10 g per dL. His EKG showed NSR. He has been taking ASA 81 mg faithfully. 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  Clinical Impression   Pt was seen for evaluation, family present.  Pt is alert, oriented and very pleasant.  He states that he is feeling much stronger although family says that his right extremities are weak as compared to the right.  Pt is severely affected by deformity of rheumatoid arthritis at baseline.  He is normally right dominant but is forced to use the LUE for most function due to limitation of wrist mobility right.  He has chronic back pain from severe kyphoscoliosis and this limits his ability to ambulate.  He also has a leg length discrepancy and wears a lift in the left shoe.  He is minimally ambulatory and usually uses his power w/c outside and a rolling desk chair in the home.  At this time, his right strength has significantly increased from yesterday and his mobility is very close to baseline.  The pt tends to be impetuous with his gait and discounts some of his deformity making him a very high fall risk (has had several falls at home).  For this reason, I think it prudent to have HHPT see him to try to increase his safety in the home.    Follow Up Recommendations Home health PT    Equipment  Recommendations  None recommended by PT    Recommendations for Other Services   none    Precautions / Restrictions Precautions Precautions: Fall Restrictions Weight Bearing Restrictions: No      Mobility  Bed Mobility               General bed mobility comments: not tested...pt sleeps in a recliner  Transfers Overall transfer level: Needs assistance Equipment used: Rolling walker (2 wheeled) Transfers: Sit to/from Stand Sit to Stand: Min guard         General transfer comment: pt tends to fall backward initally upon standing and had to be eased to chair...instructed in forward weight shift with standing transfer  Ambulation/Gait Ambulation/Gait assistance: Supervision Ambulation Distance (Feet): 90 Feet Assistive device: Rolling walker (2 wheeled) Gait Pattern/deviations: Trunk flexed;Step-through pattern   Gait velocity interpretation: >2.62 ft/sec, indicative of independent community ambulator General Gait Details: right hip externally rotated during gait but family states that gait pattern is very close to his normal                Modified Rankin (Stroke Patients Only) Modified Rankin (Stroke Patients Only) Pre-Morbid Rankin Score: Moderately severe disability Modified Rankin: Moderately severe disability     Balance Overall balance assessment: Needs assistance Sitting-balance support: No upper extremity supported;Feet supported Sitting balance-Leahy Scale: Normal     Standing balance support: No upper extremity supported;During functional  activity Standing balance-Leahy Scale: Fair                               Pertinent Vitals/Pain Pain Assessment: No/denies pain    Home Living Family/patient expects to be discharged to:: Private residence Living Arrangements: Spouse/significant other Available Help at Discharge: Family Type of Home: House Home Access: Ramped entrance     Home Layout: Able to live on main level with  bedroom/bathroom Home Equipment: [place] - 2 wheels;Cane - single point;Wheelchair - power Additional Comments: sleeps in a recliner    Prior Function Level of Independence: Needs assistance   Gait / Transfers Assistance Needed: usually sits in a rolling desk chair and rolls himself around...he is able to ambulate short distances and occsionally uses a cane...family state that he is very unsafe with gait and should be using a walker  ADL's / [place] Needed: independent with bathing and dressing (needs help with buttons)...wife does all homemaking        Hand Dominance   Dominant Hand: Right (right hand/wrist are very deformed and usually uses the left hand as dominant)    Extremity/Trunk Assessment   Upper Extremity Assessment: Overall WFL for tasks assessed (severe rheumatoid deformity in both UEs, but strength is equal left to right...mild decrease in right hand coordination)           Lower Extremity Assessment: Overall WFL for tasks assessed (leg length discrepancy due to spinal scoliosis, left shorter than right...wears a lift in shoe..strength equal left to right)      Cervical / Trunk Assessment: Kyphotic (severe spinal scoliosis)  Communication   Communication: No difficulties  Cognition Arousal/Alertness: Awake/alert Behavior During Therapy: WFL for tasks assessed/performed Overall Cognitive Status: Within Functional Limits for tasks assessed                                    Assessment/Plan    PT Assessment All further PT needs can be met in the next venue of care  PT Diagnosis Difficulty walking;Abnormality of gait   PT Problem List Decreased mobility;Decreased balance  PT Treatment Interventions     PT Goals (Current goals can be found in the Care Plan section) Acute Rehab PT Goals PT Goal Formulation: All assessment and education complete, DC therapy         Barriers to discharge  none                     End of  Session Equipment Utilized During Treatment: Gait belt Activity Tolerance: Patient tolerated treatment well Patient left: in chair;with call bell/phone within reach;with family/visitor present Nurse Communication: Mobility status         Time: 6484-7207 PT Time Calculation (min) (ACUTE ONLY): 36 min   Charges:   PT Evaluation $Initial PT Evaluation Tier I: 1 Procedure     PT G CodesDemetrios [name] L  PT 06/26/2015, 11:22 AM [phone]

## 2015-06-26 NOTE — Care Management Note (Signed)
Case Management Note  Patient Details  Name: [name] MRN: [id number] Date of Birth: Mar 29, 1946  Subjective/Objective:                  Pt admitted from home with CVA. Pt lives with his wife and will return home at discharge. Pt has a walker and cane that he uses prn.   Action/Plan: PT recommends HH PT, ST at discharge. Pt would like [facility] at discharge.Referral called to [facility] who will collect pts information from the chart. HH services to start within 48 hours. No DME needs noted. Pt and pts nurse aware of discharge arrangements.  Expected Discharge Date:                  Expected Discharge Plan:  Home w Home Health Services  In-House Referral:  NA  Discharge planning Services  CM Consult  Post Acute Care Choice:  Home Health Choice offered to:  Patient  DME Arranged:    DME Agency:     HH Arranged:  PT, OT HH Agency:  [facility]  Status of Service:  Completed, signed off  Medicare Important Message Given:    Date Medicare IM Given:    Medicare IM give by:    Date Additional Medicare IM Given:    Additional Medicare Important Message give by:     If discussed at Long Length of Stay Meetings, dates discussed:    Additional Comments:  Cheryl Flash, RN 06/26/2015, 2:16 PM

## 2015-06-26 NOTE — Progress Notes (Signed)
SLP Cancellation Note  Patient Details Name: [name] MRN: [id number] DOB: November 24, 1945   Cancelled treatment:       Reason Eval/Treat Not Completed: Other (comment); Pt passed RN swallow screen and is tolerating diet without issue. Pt screened by SLP who denies difficulty swallowing, but does endorse that his speech is "not back to normal". Pt states that yesterday he was unable to remember any phone numbers, but that he can today. Family says that his speech is "still a bit slurred", however this was not noted today. Formal evaluation is deferred as pt will be going home and home health PT will be coming to house. Recommend home health SLP for evaluation to determine if pt is back to baseline, deficits appear mild to none this date. Above to case management and Dr. Conley Rolls  Thank you,  Havery Moros, CCC-SLP [phone]    [name],[name] 06/26/2015, 2:56 PM

## 2015-06-26 NOTE — Discharge Summary (Addendum)
Physician Discharge Summary  COLUMBUS ICE DGU:[id number] DOB: Mar 31, 1946 DOA: 06/24/2015  PCP: Lubertha South, MD  Admit date: 06/24/2015 Discharge date: 06/26/2015  Time spent: 35 minutes  Recommendations for Outpatient Follow-up:  1. Follow up with your PCP in 1-2 weeks. 2. Follow up with your lung doctor as scheduled. 3. Follow up with your heart doctor for a right heart Cath.   Discharge Diagnoses:  Principal Problem:   TIA (transient ischemic attack) Active Problems:   Hypothyroidism   Hyperlipidemia   Hypertension   Rheumatoid arthritis (HCC)   Tobacco abuse, in remission   Peripheral vascular disease, unspecified (HCC)   ILD (interstitial lung disease) (HCC)   Stroke [facility])   Discharge Condition: Stable.   Diet recommendation: cardiac diet.   Filed Weights   06/24/15 0735 06/24/15 1138  Weight: 64.411 kg (142 lb) 62.324 kg (137 lb 6.4 oz)    History of present illness: patient was admitted by me on Oct 1st, 2016 for acute right sided weakness.  As per my H and P:  " HPI: [name] is an 69 y.o. male with hx of HTN on ACE-I, RA on arava and chronic steroid (  for over 10 years), BPH, chronic back pain, hypothyroidism, presented to the ER after at least 13 hours since ictus of right sided weakness with no facial droop or aphasia. He was having intermittent symptoms, as yesterday, he was not able to move his right side. He had no slurred speech or neck pain. His symptoms have improved. 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 Cervical CT showed multilevel arthrospathy. His serology showed Cr of 1.4 and Hb of 10 g per dL. His EKG showed NSR. He has been taking ASA 81 mg faithfully.    Hospital Course: patient was admitted into the hospital, and clinically, he was felt  to have had a left hemispheric CVA outside of TPA window.  His neurological deficit was very minimal, as he was able to converse fluently, and was able to move his right side quite well.  A head CT confirmed subacute left hemispheric CVA, and a follow up MRI without contrast showed acute/subacute non hemorrhagic left MCA involving the pre and post central gyrus, acute non hemorrhagic infarct in the left cerebellum, along with remote non hemorrhagic infarct of the medial right occipital lobe. His MRA showed severe segmental stenoses within left greater than right MCA branch vessels.  Mild atherosclerotic changes in the right A1 segment without any significant focal stenosis. Moderate distal PICA territory stenosis bilaterally compatible with the acute infarcts.  Mild distal PCA small vessel disease bilaterally. I spoke with Dr Gerilyn Pilgrim, who recommended full dose ASA along with Plavix for at least 3 to 6 months.  This, in addition to his Prednisone, will increase his risk for GI bleed, and he was warned to take his PPI faithfully, along with avoiding all NSAIDS.  He also had carotid US, and had no critical stenosis, just plaques.  He had an ECHO which showed no clots, but confirmed pulmonary HTN.  His pulmonary pressure was 36.  He was going to be seen by cardiology at the recommnedation of his pulmonologist, for right heart cath, given his pulmonary HTN and interstitial fibrosis was thought to be caused by methyltrexate taken for his RA.  The MTX has been discontinued, and he is due for his high resolution CT of the chest, but he didn't want to wait to have it done.  He would like to go home as soon as possible.  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  He will not be on full anticogulation for his hx of afib given his acute CVA, and that he will be  getting a RHC shortly.   He will get PT, and [facility] nurse to help him.  Thank you for allowing me to participate in his care.  Good day.   Discharge Exam: Filed Vitals:   06/26/15 1218  BP: 138/87  Pulse: 70  Temp: 97.7 F (36.5 C)  Resp: 16   Discharge Instructions    Diet - low sodium heart healthy    Complete by:  As directed      Discharge instructions    Complete by:  As directed   You will be taking Aspirin and Plavix at the same time for at least 3 to 6 months.  With Prednisone, these medicines will give you a higher risk for gastro-intestinal bleeding, so be sure you take your prilosec regularly, and take with food.  You will need to follow up with Dr Gerilyn Pilgrim of neurology, along with your lung and heart doctor.     Increase activity slowly    Complete by:  As directed           Current Discharge Medication List    START taking these medications   Details  clopidogrel (PLAVIX) 75 MG tablet Take 1 tablet (75 mg total) by mouth daily. Qty: 30 tablet, Refills: 5      CONTINUE these medications which have CHANGED   Details  aspirin EC 325 MG tablet Take 1 tablet (325 mg total) by mouth daily. Qty: 30 tablet, Refills: 5      CONTINUE these medications which have NOT CHANGED   Details  ALPRAZolam (XANAX) 1 MG tablet TAKE 1 TABLET BY MOUTH ONCE DAILY AS NEEDED FOR ANXIETY. Qty: 30 tablet, Refills: 5    atenolol (TENORMIN) 25 MG tablet TAKE (1) TABLET BY MOUTH TWICE DAILY. Qty: 60 tablet, Refills: 5    atorvastatin (LIPITOR) 80 MG tablet Take 1 tablet (80 mg total) by mouth daily. Qty: 30 tablet, Refills: 11    finasteride (PROSCAR) 5 MG tablet Take 5 mg by mouth daily.    folic acid (FOLVITE) 1 MG tablet Take 1 mg by mouth daily.    furosemide (LASIX) 20 MG tablet 1/2 to 1 qam for swelling Qty: 30 tablet, Refills: 1    HYDROcodone-acetaminophen (NORCO) 10-325 MG per tablet Take 1 tablet up to five times daily Qty: 150 tablet, Refills: 0    leflunomide (ARAVA) 20  MG tablet TAKE 2 TABLETS BY MOUTH DAILY Qty: 180 tablet, Refills: 0    levothyroxine (SYNTHROID, LEVOTHROID) 88 MCG tablet TAKE ONE TABLET BY MOUTH ONCE DAILY. Qty: 30 tablet, Refills: 2    lisinopril (PRINIVIL,ZESTRIL) 20 MG tablet TAKE ONE TABLET BY MOUTH DAILY. Qty: 30 tablet, Refills: 5    Melatonin 3 MG TABS Take 3-6 mg by mouth at bedtime as needed.     Multiple Vitamin (MULTIVITAMIN) capsule Take 1 capsule by mouth daily.    omeprazole (PRILOSEC OTC) 20 MG tablet Take 20 mg by mouth 2 (two) times daily.  predniSONE (DELTASONE) 10 MG tablet TAKE ONE TABLET BY MOUTH ONCE DAILY. Qty: 30 tablet, Refills: 11    tamsulosin (FLOMAX) 0.4 MG CAPS capsule Take 0.4 mg by mouth daily.    tiZANidine (ZANAFLEX) 4 MG tablet TAKE 1 TABLET BY MOUTH THREE TIMES DAILY AS NEEDED FOR MUSCLE SPASMS. Qty: 30 tablet, Refills: 11    Vitamin D, Ergocalciferol, (DRISDOL) 50000 UNITS CAPS capsule Take 50,000 Units by mouth every 7 (seven) days. On Monday.      STOP taking these medications     ibuprofen (ADVIL,MOTRIN) 800 MG tablet      OVER THE COUNTER MEDICATION      HYDROcodone-homatropine (HYCODAN) 5-1.5 MG/5ML syrup        Allergies  Allergen Reactions  . Clindamycin/Lincomycin Other (See Comments)    Kidney Failure  . Amoxicillin Hives and Other (See Comments)    Hallucinations   . Ciprofloxacin Other (See Comments)    C-diff  . Other Hives, Diarrhea and Other (See Comments)    "Mycin" Causes C-diff  . Doxycycline Nausea And Vomiting  . Levofloxacin Nausea And Vomiting   Follow-up Information    Follow up with [facility].   Contact information:   [address] [place] [address] [phone]        The results of significant diagnostics from this hospitalization (including imaging, microbiology, ancillary and laboratory) are listed below for reference.    Significant Diagnostic Studies: Ct Head Wo Contrast  06/24/2015   CLINICAL DATA:   Pain following fall  EXAM: CT HEAD WITHOUT CONTRAST  CT CERVICAL SPINE WITHOUT CONTRAST  TECHNIQUE: Multidetector CT imaging of the head and cervical spine was performed following the standard protocol without intravenous contrast. Multiplanar CT image reconstructions of the cervical spine were also generated.  COMPARISON:  Cervical MRI April 29, 2015.  Head CT Feb 04, 2014  FINDINGS: CT HEAD FINDINGS  There is mild diffuse atrophy. There is no intracranial mass, hemorrhage, extra-axial fluid collection, or midline shift. There is patchy small vessel disease in the centra semiovale bilaterally. There is decreased attenuation in the inferomedial right occipital lobe consistent with prior infarct, not apparent on prior study. There is a small focus of decreased attenuation in the head of the caudate nucleus on the right, consistent with a prior small lacunar infarct in this area. On the current examination, there is rather subtle decreased attenuation at the left frontal -parietal junction which may represent an early infarct. No other evidence suggesting potential acute infarct. Bony calvarium appears intact. Mastoid air cells on the right are clear. There is opacification of several ethmoid air cells on the left.  CT CERVICAL SPINE FINDINGS  There is no fracture. There is slight retrolisthesis of C3 on C4. There is slight anterolisthesis of C5 on C6. There is slight retrolisthesis of C6 on C7. These areas of spondylolisthesis are felt to be due to underlying spondylosis. Prevertebral soft tissues and predental space regions are normal. There is marked disc space narrowing at C3-4, C6-7, and C7-T1. There is moderate disc space narrowing at C2-3 and C5-6. There is facet hypertrophy to varying degrees at most levels. There is broad-based disc protrusion exacerbated by the spondylolisthesis at C3-4. There is slight central stenosis at this level due to these findings. There is marked exit foraminal narrowing at C6-7 and  C7-T1 on the right. There is moderate narrowing of the canal at C5-6 on the right due to bony hypertrophy.  There is calcification in each carotid  artery. There is scarring in each lung apex.  IMPRESSION: CT head: Question early infarct left frontal -parietal junction. This finding is best appreciated on axial slices 23 and 24 series 2. There is evidence of a prior infarct in the inferomedial right occipital lobe. There is a tiny prior infarct in the head of the caudate nucleus on the right. There is atrophy with mild periventricular small vessel disease. There is no hemorrhage or mass effect. There is left-sided mastoid air cell disease, not present previously.  CT cervical spine: Multilevel arthropathy. Areas of mild spondylolisthesis at several levels, felt to be due to underlying spondylosis. No acute fracture. Calcification in each carotid artery.   Electronically Signed   By: Bretta Bang III M.D.   On: 06/24/2015 08:54   Ct Cervical Spine Wo Contrast  06/24/2015   CLINICAL DATA:  Pain following fall  EXAM: CT HEAD WITHOUT CONTRAST  CT CERVICAL SPINE WITHOUT CONTRAST  TECHNIQUE: Multidetector CT imaging of the head and cervical spine was performed following the standard protocol without intravenous contrast. Multiplanar CT image reconstructions of the cervical spine were also generated.  COMPARISON:  Cervical MRI April 29, 2015.  Head CT Feb 04, 2014  FINDINGS: CT HEAD FINDINGS  There is mild diffuse atrophy. There is no intracranial mass, hemorrhage, extra-axial fluid collection, or midline shift. There is patchy small vessel disease in the centra semiovale bilaterally. There is decreased attenuation in the inferomedial right occipital lobe consistent with prior infarct, not apparent on prior study. There is a small focus of decreased attenuation in the head of the caudate nucleus on the right, consistent with a prior small lacunar infarct in this area. On the current examination, there is rather  subtle decreased attenuation at the left frontal -parietal junction which may represent an early infarct. No other evidence suggesting potential acute infarct. Bony calvarium appears intact. Mastoid air cells on the right are clear. There is opacification of several ethmoid air cells on the left.  CT CERVICAL SPINE FINDINGS  There is no fracture. There is slight retrolisthesis of C3 on C4. There is slight anterolisthesis of C5 on C6. There is slight retrolisthesis of C6 on C7. These areas of spondylolisthesis are felt to be due to underlying spondylosis. Prevertebral soft tissues and predental space regions are normal. There is marked disc space narrowing at C3-4, C6-7, and C7-T1. There is moderate disc space narrowing at C2-3 and C5-6. There is facet hypertrophy to varying degrees at most levels. There is broad-based disc protrusion exacerbated by the spondylolisthesis at C3-4. There is slight central stenosis at this level due to these findings. There is marked exit foraminal narrowing at C6-7 and C7-T1 on the right. There is moderate narrowing of the canal at C5-6 on the right due to bony hypertrophy.  There is calcification in each carotid artery. There is scarring in each lung apex.  IMPRESSION: CT head: Question early infarct left frontal -parietal junction. This finding is best appreciated on axial slices 23 and 24 series 2. There is evidence of a prior infarct in the inferomedial right occipital lobe. There is a tiny prior infarct in the head of the caudate nucleus on the right. There is atrophy with mild periventricular small vessel disease. There is no hemorrhage or mass effect. There is left-sided mastoid air cell disease, not present previously.  CT cervical spine: Multilevel arthropathy. Areas of mild spondylolisthesis at several levels, felt to be due to underlying spondylosis. No acute fracture. Calcification in each carotid artery.  Electronically Signed   By: Bretta Bang III M.D.   On:  06/24/2015 08:54   Mr Maxine Glenn Head Wo Contrast  06/26/2015   CLINICAL DATA:  Left MCA territory stroke.  EXAM: MRA HEAD WITHOUT CONTRAST  TECHNIQUE: Angiographic images of the Circle of Willis were obtained using MRA technique without intravenous contrast.  COMPARISON:  MRI brain 06/26/2015.  FINDINGS: Internal carotid arteries are within normal limits from the high cervical segments through the ICA termini bilaterally. Mild segmental narrowing is present in the right A1 segment. The left A1 segment is intact. The M1 segments are normal bilaterally. The anterior communicating artery is patent. The MCA bifurcations are intact. There is moderate narrowing of the anterior right M2 division with prominent segmental narrowing of branch vessels. The left MCA bifurcation is intact with severe is segmental stenoses.  The left vertebral artery is slightly dominant to the right. PICA origins are visualized and normal bilaterally. The basilar artery is within normal limits. More distal PICA stenosis is present bilaterally. Both posterior cerebral arteries originate from the basilar tip. There is moderate attenuation of distal PCA branch vessels.  IMPRESSION: 1. Severe segmental stenoses within left greater than right MCA branch vessels. 2. Mild atherosclerotic changes in the right A1 segment without a significant focal stenosis. 3. Moderate distal PICA territory stenosis bilaterally compatible with the acute infarcts. 4. Mild distal PCA small vessel disease bilaterally.   Electronically Signed   By: Marin Roberts M.D.   On: 06/26/2015 10:20   Mr Brain Wo Contrast  06/26/2015   CLINICAL DATA:  Left CVA. Right-sided weakness and numbness beginning 2 days ago. Fall in living room at the patient's home 2 nights ago.  EXAM: MRI HEAD WITHOUT CONTRAST  TECHNIQUE: Multiplanar, multiecho pulse sequences of the brain and surrounding structures were obtained without intravenous contrast.  COMPARISON:  CT of the head without  contrast 06/24/2015.  FINDINGS: The diffusion-weighted images confirm an acute nonhemorrhagic infarct involving the left precentral and postcentral gyrus. Two punctate nonhemorrhagic infarcts are evident within the left cerebellum. No other acute infarct is evident.  Remote lacunar infarcts are present within the basal ganglia bilaterally including the right caudate head. Bilateral periventricular and scattered subcortical T2 changes are present bilaterally. A remote medial right occipital infarct is evident. White matter changes extend into the brainstem.  The internal auditory canal is within normal limits bilaterally. Flow is present in the major intracranial arteries. Bilateral lens replacements are present. The globes and orbits are otherwise intact.  Mild mucosal thickening is present in the left maxillary sinus and scattered throughout the ethmoid air cells. The paranasal sinuses are otherwise clear. A left mastoid effusion is present. No obstructing nasopharyngeal lesion is evident.  IMPRESSION: 1. Nonhemorrhagic left MCA acute/subacute infarct involving the pre and post central gyrus. 2. Acute nonhemorrhagic punctate infarcts in the left cerebellum. 3. Remote nonhemorrhagic infarct of the medial right occipital lobe. 4. Age advanced atrophy and white matter disease, likely reflecting the sequela of chronic microvascular ischemia.   Electronically Signed   By: Marin Roberts M.D.   On: 06/26/2015 10:16   US Carotid Bilateral  06/25/2015   CLINICAL DATA:  stroke, hypertension, coronary disease, diabetes, previous tobacco abuse  EXAM: BILATERAL CAROTID DUPLEX ULTRASOUND  TECHNIQUE: Wallace Cullens scale imaging, color Doppler and duplex ultrasound was performed of bilateral carotid and vertebral arteries in the neck.  COMPARISON:  07/27/2013  REVIEW OF SYSTEMS: Quantification of carotid stenosis is based on velocity parameters that correlate the residual internal  carotid diameter with NASCET-based stenosis  levels, using the diameter of the distal internal carotid lumen as the denominator for stenosis measurement.  The following velocity measurements were obtained:  PEAK SYSTOLIC/END DIASTOLIC  RIGHT  ICA:                     96/26cm/sec  CCA:                     75/14cm/sec  SYSTOLIC ICA/CCA RATIO:  1.3  DIASTOLIC ICA/CCA RATIO: 1.8  ECA:                     87cm/sec  LEFT  ICA:                     67/18cm/sec  CCA:                     61/11cm/sec  SYSTOLIC ICA/CCA RATIO:  1.1  DIASTOLIC ICA/CCA RATIO: 1.7  ECA:                     63cm/sec  FINDINGS: RIGHT CAROTID ARTERY: Eccentric partially calcified plaque in the bulb extending to the origins of the internal and external carotid arteries. No high-grade stenosis. Normal waveforms and color Doppler signal.  RIGHT VERTEBRAL ARTERY:  Normal flow direction and waveform.  LEFT CAROTID ARTERY: Intimal thickening through the common carotid artery. Circumferential partially calcified plaque at the bifurcation extending into the proximal ICA resulting in at least mild stenosis. Normal waveforms and color Doppler signal.  LEFT VERTEBRAL ARTERY: Normal flow direction and waveform.  IMPRESSION: 1. Bilateral carotid bifurcation and proximal ICA plaque, resulting in less than 50% diameter stenosis. The exam does not exclude plaque ulceration or embolization. Continued surveillance recommended.   Electronically Signed   By: Corlis Leak M.D.   On: 06/25/2015 10:00   Labs: Basic Metabolic Panel:  Recent Labs Lab 06/24/15 0808  NA 143  K 3.5  CL 118*  CO2 18*  GLUCOSE 88  BUN 20  CREATININE 1.46*  CALCIUM 7.0*   Liver Function Tests:  Recent Labs Lab 06/24/15 0808  AST 27  ALT 21  ALKPHOS 84  BILITOT 0.6  PROT 6.5  ALBUMIN 3.2*   No results for input(s): LIPASE, AMYLASE in the last 168 hours. No results for input(s): AMMONIA in the last 168 hours. CBC:  Recent Labs Lab 06/24/15 0808  WBC 12.6*  NEUTROABS 9.5*  HGB 10.2*  HCT 32.7*  MCV 94.8   PLT 167   Cardiac Enzymes: No results for input(s): CKTOTAL, CKMB, CKMBINDEX, TROPONINI in the last 168 hours. BNP: BNP (last 3 results)  Recent Labs  04/11/15 1702  BNP 755.5*   Signed:  Daegon [name]  [facility] 06/26/2015, 4:14 PM

## 2015-06-26 NOTE — Progress Notes (Signed)
Patient discharged with instructions, prescription, and care notes.  Verbalized understanding via teach back.  IV was removed and the site was WNL. Patient voiced no further complaints or concerns at the time of discharge.  Appointments scheduled per instructions.  Patient left the floor via w/c with staff and family in stable condition. 

## 2015-06-28 ENCOUNTER — Other Ambulatory Visit: Payer: Self-pay | Admitting: *Deleted

## 2015-06-28 ENCOUNTER — Telehealth: Payer: Self-pay | Admitting: Family Medicine

## 2015-06-28 MED ORDER — HYDROCODONE-ACETAMINOPHEN 10-325 MG PO TABS: ORAL_TABLET | ORAL | Status: DC

## 2015-06-28 NOTE — Telephone Encounter (Signed)
Ok write two wks worth, pt should report this to authorities, new contract statres cannot eplace "stolen" meds this must be the only time we can ever do this

## 2015-06-28 NOTE — Telephone Encounter (Signed)
Discussed with pt. Script ready.

## 2015-06-28 NOTE — Telephone Encounter (Signed)
Pt needs new pain meds or stronger than what he has, because  What he has is not working.   He had a recent stroke an is unable to come into the office at this point.   His back pain is worse, the hospital took him off his Ibuprofen    [place] [facility]

## 2015-06-28 NOTE — Telephone Encounter (Signed)
Pt's grandson's girlfriend Marchelle Folks calling to report Hydrocodone was stolen. Pt was admitted to hospital for stroke. Ibuprofen was stopped in the hospital bc he is on prednisone. He was taking 2400mg  per day. Pt has a script of hydrocodone but cannot be filled til oct 16th. Can he get a script for hydrocodone to last til the 16th. He takes for back pain.

## 2015-06-30 ENCOUNTER — Telehealth: Payer: Self-pay | Admitting: Internal Medicine

## 2015-06-30 NOTE — Telephone Encounter (Signed)
Spoke with pt, states he was supposed to have a CT but was unable to keep his appt d/t being hospitalized for a stroke.   Pt wishes to reschedule hrct.    PCC's please advise if a new order needs to be made or if this can be done from the existing order.  Thanks!

## 2015-06-30 NOTE — Telephone Encounter (Signed)
Called Mr. Able after talking with Katherine Basset the order should still be good since it was just placed on 06/22/2015. I gave him Harriett Sine with [facility] # to call and reschedule at his convenience. He had his wife to write down the # and name.

## 2015-07-03 ENCOUNTER — Telehealth: Payer: Self-pay

## 2015-07-03 NOTE — Telephone Encounter (Signed)
   Message  Received: 1 week ago    Antoine Poche, MD  Kerney Elbe, LPN            Please let patient know that his lung doctor Dr Conni Elliot contacted me about arranging a test to measure the pressures on his heart. Please arrange a right heart cath with Dr Excell Seltzer at [facility] for pulmonary hypertension   Dominga Ferry MD       Called PT to schedule heart cath, But the patient states that he had a stroke last week and would rather wait until his appointment with Dr. Wyline Mood on Wed. To discuss the cath further. He is hesitant to do it, but says he will if it is necessary.

## 2015-07-04 ENCOUNTER — Ambulatory Visit (INDEPENDENT_AMBULATORY_CARE_PROVIDER_SITE_OTHER): Payer: Medicare Other | Admitting: Family Medicine

## 2015-07-04 ENCOUNTER — Encounter: Payer: Self-pay | Admitting: Family Medicine

## 2015-07-04 VITALS — BP 120/74 | Ht 63.0 in | Wt 136.1 lb

## 2015-07-04 DIAGNOSIS — E119 Type 2 diabetes mellitus without complications: Secondary | ICD-10-CM

## 2015-07-04 DIAGNOSIS — G894 Chronic pain syndrome: Secondary | ICD-10-CM

## 2015-07-04 DIAGNOSIS — I679 Cerebrovascular disease, unspecified: Secondary | ICD-10-CM

## 2015-07-04 NOTE — Progress Notes (Signed)
   Subjective:    Patient ID: [name], male    DOB: [birth year], 69 y.o.   MRN: [id number] Patient arrives office for follow-up from hospitalization.  Patient experienced a stroke. Still having substantial residual weakness on the right side. This is understandably frustrating to him. Has had physical therapist. They just asked for occupational therapist which we certainly agreed to. Next  Patient claims compliance with blood pressure medicine. Watch his salt intake. Does not miss a dose.  Now on a higher dose aspirin along with Plavix secondary to the stroke. Was supposed to follow-up with Dr. doing quad but has no appointment scheduled.   Diabetes He presents for his follow-up diabetic visit. He has type 2 diabetes mellitus. No MedicAlert identification noted. He has not had a previous visit with a dietitian. He sees a podiatrist.Eye exam is current (12/22/14).    Patient had recently had A1c done at hospital ER visit on 06/25/15: Hemoglobin A1c was 6.0   Patient states no other concerns this visit.   Review of Systems No headache no chest pain positive chronic back pain no abdominal pain    Objective:   Physical Exam Alert vital stable HEENT normal lungs clear heart rare rhythm right arm diminished strength fine hand coordination substantially diminished lungs clear heart regular rate and rhythm. Ankles trace edema changes rheumatoid arthritis evident.       Assessment & Plan:  Impression status post hospitalization for stroke #2 risk factor discussion held #3 type 2 diabetes good control #4 hypertension good control #5 physical and mental stresses associated with stroke discussed at length plan exercise diet discussed. Add occupational therapy. We'll set up a neurology referral. Medication refill diet exercise discussed. Follow-up in one month. WSL

## 2015-07-05 ENCOUNTER — Ambulatory Visit (INDEPENDENT_AMBULATORY_CARE_PROVIDER_SITE_OTHER): Payer: Medicare Other | Admitting: Cardiology

## 2015-07-05 ENCOUNTER — Ambulatory Visit: Payer: Medicare Other | Admitting: Family Medicine

## 2015-07-05 ENCOUNTER — Encounter: Payer: Self-pay | Admitting: Cardiology

## 2015-07-05 VITALS — BP 104/58 | HR 61 | Ht 66.0 in | Wt 135.2 lb

## 2015-07-05 DIAGNOSIS — R05 Cough: Secondary | ICD-10-CM

## 2015-07-05 DIAGNOSIS — I639 Cerebral infarction, unspecified: Secondary | ICD-10-CM

## 2015-07-05 DIAGNOSIS — I2510 Atherosclerotic heart disease of native coronary artery without angina pectoris: Secondary | ICD-10-CM | POA: Diagnosis not present

## 2015-07-05 DIAGNOSIS — I10 Essential (primary) hypertension: Secondary | ICD-10-CM | POA: Diagnosis not present

## 2015-07-05 DIAGNOSIS — R059 Cough, unspecified: Secondary | ICD-10-CM

## 2015-07-05 DIAGNOSIS — I739 Peripheral vascular disease, unspecified: Secondary | ICD-10-CM

## 2015-07-05 DIAGNOSIS — R0600 Dyspnea, unspecified: Secondary | ICD-10-CM

## 2015-07-05 DIAGNOSIS — E785 Hyperlipidemia, unspecified: Secondary | ICD-10-CM

## 2015-07-05 MED ORDER — LOSARTAN POTASSIUM 25 MG PO TABS: 25.0000 mg | ORAL_TABLET | Freq: Every day | ORAL | Status: AC

## 2015-07-05 NOTE — Patient Instructions (Addendum)
Medication Instructions:  Your physician has recommended you make the following change in your medication:  1)  STOP Lisinopril 2) START Losartan 25 mg daily  Labwork: None ordered  Testing/Procedures: None ordered  Follow-Up: Your physician recommends that you schedule a follow-up appointment in: 2 months with Dr. Wyline Mood.  Any Other Special Instructions Will Be Listed Below (If Applicable). Thank you for choosing [facility]!!

## 2015-07-05 NOTE — Progress Notes (Signed)
Patient ID: KINGSTYN HOLAWAY, male   DOB: 1946-03-22, 69 y.o.   MRN: [id number]     Clinical Summary Mr. Konopa is a 69 y.o.male seen today for follow up of the following medical problem.  1. CAD  - prior CABG 1998 at [facility]  - last cath 10/2007 shows LM 95%, occluded LAD, LCX, and RCA. LIMA-LAD patent, SVG-OM patent, SVG to RCA occluded. Echo Jan 2012 LVEF 60-65%, grade II diastolicdysfunction   - denies any chest pain. Denies any significant SOB or DOE.  -compliant w/ meds  2. PAD  - prior SFA to popliteal bypass with SVG, with amputation of the left fifth toe  - also history of left iliac stenting  - recent procedure 09/2013 with lower extremity cath/angio, high grade stenosis tibial artery that was balloon dilated.  - followed by vascular  - no recent leg pains  3. HL  - currently on high dose atorva - 06/2014 TC 226 TG 395 HDL 52 LDL 95  4. HTN - compliant with meds  5. CVA - admit 06/2015 with acute right sided weakness - MRI showed subacute left MCA infarct as well as infarct of medial right occipital lobe. He was startd on DAPT by neuro.  - has f/u with Dr Janann Colonel  6. Dyspnea - has been progressing, patient is followed by pulmonary. There is concern for possible interstitial lung disease or possible pulmonary HTN. He does have history of RA and has been on methotrexate. RHC has been requested by his pulmonologist. - 06/2015 echo LVEF 60-65%, grade II diastolic dysfunction, PASP 36, normal RV - high resolution chest CT pending - cough x 6 months, dry nonproductive  Past Medical History  Diagnosis Date  . 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 Hyperlipidemia   . Hypertension   . PVD (peripheral vascular disease) (HCC)     Left iliac PCI/stent  . Cerebrovascular disease   . Tobacco abuse, in remission     Remote  . GERD (gastroesophageal reflux disease)   . Hypothyroid   . Allergic rhinitis   . Chronic lung disease     ?   Rheumatoid lung; ?  Adverse reaction to methotrexate  . Schatzki's ring   . Hx of Clostridium difficile infection     Recurrent; associated 40 pound weight loss  . Achalasia   . Diabetes mellitus     borderline no meds  . Atrial fibrillation (HCC)   . Chronic back pain   . 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 Cervical spondylosis   . DDD (degenerative disc disease), lumbar   . Pneumonia   . Carotid artery occlusion   . Nephrolithiasis 2010    s/p lithotripsy  . Hiatal hernia      Allergies  Allergen Reactions  . Clindamycin/Lincomycin Other (See Comments)    Kidney Failure  . Amoxicillin Hives and Other (See Comments)    Hallucinations   . Ciprofloxacin Other (See Comments)    C-diff  . Other Hives, Diarrhea and Other (See Comments)    "Mycin" Causes C-diff  . Doxycycline Nausea And Vomiting  . Levofloxacin Nausea And Vomiting     Current Outpatient Prescriptions  Medication Sig Dispense Refill  . ALPRAZolam (XANAX) 1 MG tablet TAKE 1 TABLET BY MOUTH ONCE DAILY AS NEEDED FOR ANXIETY. 30 tablet 5  . aspirin EC 325 MG tablet Take 1 tablet (325 mg total) by mouth daily. 30 tablet 5  . atenolol (TENORMIN)  25 MG tablet TAKE (1) TABLET BY MOUTH TWICE DAILY. 60 tablet 5  . atorvastatin (LIPITOR) 80 MG tablet Take 1 tablet (80 mg total) by mouth daily. 30 tablet 11  . clopidogrel (PLAVIX) 75 MG tablet Take 1 tablet (75 mg total) by mouth daily. 30 tablet 5  . finasteride (PROSCAR) 5 MG tablet Take 5 mg by mouth daily.    . folic acid (FOLVITE) 1 MG tablet Take 1 mg by mouth daily.    . furosemide (LASIX) 20 MG tablet 1/2 to 1 qam for swelling (Patient not taking: Reported on 07/04/2015) 30 tablet 1  . HYDROcodone-acetaminophen (NORCO) 10-325 MG tablet Take 1 tablet up to five times daily 70 tablet 0  . leflunomide (ARAVA) 20 MG tablet TAKE 2 TABLETS BY MOUTH DAILY 180 tablet 0  . levothyroxine (SYNTHROID, LEVOTHROID) 88 MCG tablet TAKE ONE TABLET BY MOUTH ONCE  DAILY. 30 tablet 2  . lisinopril (PRINIVIL,ZESTRIL) 20 MG tablet TAKE ONE TABLET BY MOUTH DAILY. 30 tablet 5  . Melatonin 3 MG TABS Take 3-6 mg by mouth at bedtime as needed.     . Multiple Vitamin (MULTIVITAMIN) capsule Take 1 capsule by mouth daily.    Marland Kitchen omeprazole (PRILOSEC OTC) 20 MG tablet Take 20 mg by mouth 2 (two) times daily.    . predniSONE (DELTASONE) 10 MG tablet TAKE ONE TABLET BY MOUTH ONCE DAILY. 30 tablet 11  . tamsulosin (FLOMAX) 0.4 MG CAPS capsule Take 0.4 mg by mouth daily.    Marland Kitchen tiZANidine (ZANAFLEX) 4 MG tablet TAKE 1 TABLET BY MOUTH THREE TIMES DAILY AS NEEDED FOR MUSCLE SPASMS. 30 tablet 11  . Vitamin D, Ergocalciferol, (DRISDOL) 50000 UNITS CAPS capsule Take 50,000 Units by mouth every 7 (seven) days. On Monday.     No current facility-administered medications for this visit.     Past Surgical History  Procedure Laterality Date  . Coronary artery bypass graft      x6 In 1998  . Total knee arthroplasty      Right  . Ankle fusion      Left  . Wrist fusion      Right  . Shoulder surgery    . Colonoscopy  09/12/2011    Dr. Elly Modena hemorrhoids, normal colon and distal terminal ileum. Random bx negative. Stool for CDiff positive.  . Esophagogastroduodenoscopy  09/13/2011    Dr. Lyndle Herrlich plawues mid-esophagus KOH negative. Distal esophageal ring and ulcer. Mild gastritis. duodenal diverticulum. Savaory dilation 16mm.   Gaspar Bidding dilation  09/13/2011  . Esophageal manometry  09/30/2011    Procedure: ESOPHAGEAL MANOMETRY (EM);  Surgeon: Rob Bunting, MD;  Location: [facility]COPY;  Service: Endoscopy;  Laterality: N/A;  . Esophagogastroduodenoscopy  09/12/2011    Dr. Rinaldo Ratel rings s/p dilation  . Cardiac surgery    . Esophagomyotomy  11/12/11    [facility]- with DOR antireflux surgery  . Esophagogastroduodenoscopy  06/25/2012    Procedure: ESOPHAGOGASTRODUODENOSCOPY (EGD);  Surgeon: Corbin Ade, MD;  Location: [facility] ENDO SUITE;  Service: Endoscopy;   Laterality: N/A;  7:30  . Savory dilation  06/25/2012    Procedure: SAVORY DILATION;  Surgeon: Corbin Ade, MD;  Location: [facility] ENDO SUITE;  Service: Endoscopy;  Laterality: N/A;  Elease Hashimoto dilation  06/25/2012    Procedure: Elease Hashimoto DILATION;  Surgeon: Corbin Ade, MD;  Location: [facility] ENDO SUITE;  Service: Endoscopy;  Laterality: N/A;  . Ankle fusion  09/01/2012    Procedure: ANKLE FUSION;  Surgeon: Valeria Batman, MD;  Location:  [facility] OR;  Service: Orthopedics;  Laterality: Left;  Take down of angulated tibial/fibula Fractures  . Femoral-tibial bypass graft Left 01/28/2013    Procedure: BYPASS GRAFT FEMORAL-TIBIAL ARTERY;  Surgeon: Nada Libman, MD;  Location: [facility] OR;  Service: Vascular;  Laterality: Left;  Ultrasound Guided  . Amputation Left 01/28/2013    Procedure: AMPUTATION LEFT 5TH TOE;  Surgeon: Nada Libman, MD;  Location: [facility] OR;  Service: Vascular;  Laterality: Left;  . Spine surgery  11-03-13  . Abdominal aortagram N/A 01/01/2013    Procedure: ABDOMINAL Ronny Flurry;  Surgeon: Sherren Kerns, MD;  Location: [facility] CATH LAB;  Service: Cardiovascular;  Laterality: N/A;  . Abdominal aortagram N/A 10/05/2013    Procedure: ABDOMINAL AORTAGRAM;  Surgeon: Nada Libman, MD;  Location: [facility] CATH LAB;  Service: Cardiovascular;  Laterality: N/A;  . Lower extremity angiogram Left 10/05/2013    Procedure: LOWER EXTREMITY ANGIOGRAM;  Surgeon: Nada Libman, MD;  Location: [facility] CATH LAB;  Service: Cardiovascular;  Laterality: Left;  . Abdominal aortagram N/A 11/01/2014    Procedure: ABDOMINAL Ronny Flurry;  Surgeon: Nada Libman, MD;  Location: [facility] CATH LAB;  Service: Cardiovascular;  Laterality: N/A;  . Joint replacement Right 1999    Knee  . Toe amputation Left 09/05/2014    Removed left fifth toe  . Cataract extraction w/phaco Left 12/22/2014    Procedure: CATARACT EXTRACTION PHACO AND INTRAOCULAR LENS PLACEMENT (IOC);  Surgeon: Gemma Payor, MD;  Location: [facility] ORS;  Service: Ophthalmology;  Laterality:  Left;  CDE:11.89  . Cataract extraction w/phaco Right 01/30/2015    Procedure: CATARACT EXTRACTION PHACO AND INTRAOCULAR LENS PLACEMENT RIGHT EYE;  Surgeon: Gemma Payor, MD;  Location: [facility] ORS;  Service: Ophthalmology;  Laterality: Right;  CDE:10.56  . Eye surgery Left December 22, 2014    Cataract  . Eye surgery Right December 31, 2014    Cataract     Allergies  Allergen Reactions  . Clindamycin/Lincomycin Other (See Comments)    Kidney Failure  . Amoxicillin Hives and Other (See Comments)    Hallucinations   . Ciprofloxacin Other (See Comments)    C-diff  . Other Hives, Diarrhea and Other (See Comments)    "Mycin" Causes C-diff  . Doxycycline Nausea And Vomiting  . Levofloxacin Nausea And Vomiting      Family History  Problem Relation Age of Onset  . Stomach cancer Mother 38  . Cancer Mother     Stomach  . Heart disease Mother   . Heart attack Mother   . Heart attack Father   . Heart disease Father   . Heart disease Brother   . Leukemia Brother   . Lung disease Son     infant  . Lung disease Daughter     infant     Social History Mr. Apo reports that he quit smoking about 49 years ago. His smoking use included Cigarettes. He started smoking about 58 years ago. He has a 2.5 pack-year smoking history. He has quit using smokeless tobacco. Mr. Swails reports that he does not drink alcohol.   Review of Systems CONSTITUTIONAL: No weight loss, fever, chills, weakness or fatigue.  HEENT: Eyes: No visual loss, blurred vision, double vision or yellow sclerae.No hearing loss, sneezing, congestion, runny nose or sore throat.  SKIN: No rash or itching.  CARDIOVASCULAR: per HPI RESPIRATORY: No shortness of breath, cough or sputum.  GASTROINTESTINAL: No anorexia, nausea, vomiting or diarrhea. No abdominal pain or blood.  GENITOURINARY: No burning on urination, no  polyuria NEUROLOGICAL: right sided weakness MUSCULOSKELETAL: No muscle, back pain, joint pain or stiffness.    LYMPHATICS: No enlarged nodes. No history of splenectomy.  PSYCHIATRIC: No history of depression or anxiety.  ENDOCRINOLOGIC: No reports of sweating, cold or heat intolerance. No polyuria or polydipsia.  Marland Kitchen   Physical Examination Filed Vitals:   07/05/15 1130  BP: 104/58  Pulse: 61   Filed Vitals:   07/05/15 1130  Height: 5\' 6"  (1.676 m)  Weight: 135 lb 3.2 oz (61.326 kg)    Gen: resting comfortably, no acute distress HEENT: no scleral icterus, pupils equal round and reactive, no palptable cervical adenopathy,  CV: RRR, no m/r/g, no jvd Resp: Clear to auscultation bilaterally GI: abdomen is soft, non-tender, non-distended, normal bowel sounds, no hepatosplenomegaly MSK: extremities are warm, no edema.  Skin: warm, no rash Psych: appropriate affect   Diagnostic Studies  10/2007 Cath  HEMODYNAMIC RESULTS: Left ventricle 139/16 mmHg. Aorta 136/67 mmHg.  ANGIOGRAPHIC FINDINGS:  1. Left main coronary artery is severely diseased to approximately 95%  in diffuse fashion. This has progressed compared to the previous  angiogram. This vessel gives rise to the left anterior descending,  a very small ramus intermedius, and circumflex vessels.  2. The left anterior descending is occluded proximally.  3. The circumflex coronary artery is occluded proximally.  4. The right coronary artery is occluded proximally just after the  takeoff of a right ventricular marginal branch. There are some  right to right bridging collaterals noted filling the right  coronary artery nearly down to the distal portion.  5. The saphenous vein graft to the first and second obtuse marginals  is widely patent. There is an area of 30-40% stenosis within the  proximal vessel although not clearly flow-limiting. The  anastomotic sites look to be intact.  6. There is known occlusion of the saphenous vein graft of the right  coronary system.  7. The LIMA to the left anterior descending and  diagonal is widely  patent. Anastomotic sites are intact. Of note, the distal left  anterior descending is diffusely diseased, and there is an apical  90% stenosis noted. The diagonal branch is also diffusely diseased  and relatively small.  8. There is some left-to-right collateralization of the distal right  coronary artery system, although not well-developed.  Ventriculography is performed in the RAO projection and reveals an  ejection fraction of approximately 60% with no significant wall motion  abnormality and no mitral regurgitation.  DIAGNOSES:  1. Severe native coronary artery disease as outlined. There has been  progression in left main disease, although continued occlusion  proximally of the left anterior descending and circumflex vessels.  The distal left anterior descending is diffusely diseased, and  there is some left-to-right collateralization of the occluded right  coronary artery although not to a large degree.  2. Patent left internal mammary artery to left anterior descending and  diagonal, patent saphenous vein graft to the first and second  obtuse marginal with 30-40% proximal vein graft stenosis (not  clearly flow-limiting), and known occlusion of the saphenous vein  graft to posterior descending and right coronary artery.  3. Left ventricular ejection fraction of approximately 60% with no  large focal wall motion abnormality and a left ventricular end-  diastolic pressure of 16 mmHg. No significant mitral regurgitation  is noted.  DISCUSSION: No obvious revascularization options noted at this time.  Suspect that angina may well be related to progression in the patient's  native disease rather  than new hemodynamically significant occlusions  within his remaining 2 bypass grafts which are widely patent. There is  30-40% stenosis within the saphenous vein graft of the obtuse marginal  system, although this does not appear to be  flow-limiting. At this  point, medical therapy seems to be the best option. I discussed this  with the patient, his family, and with Dr. [name].   Echo 09/2010: LVEF 60-65%, grade II diastolic dysfunction   04/2013 ABI: right 1.39 left 1.46  Jan 2015 LE Angiogram  Findings:  Aortogram: No significant suprarenal aortic stenosis is identified. There is no evidence of renal artery stenosis. The infrarenal abdominal aorta is widely patent. The stent within the left common iliac artery is widely patent. The left external iliac artery is widely patent. The right iliac system is widely patent.  Left Lower Extremity: The left common femoral and profunda femoral arteries are widely patent. The left superficial femoral artery is patent down to the adductor canal where it occludes. There is a bypass graft originating from the distal superficial femoral artery. The proximal anastomosis is widely patent. The vein bypass graft is widely patent. The distal anastomosis shows mild narrowing and just beyond the anastomosis is a high-grade stenosis of approximately 80%. The posterior tibial artery isn't patent down across the ankle.  Intervention: After the above images were acquired, the decision was made to proceed with intervention. Over an 035 wire, a 6 French sheath was advanced into the left superficial femoral artery. The patient was fully heparinized. A [place] core wire was easily advanced across the distal anastomosis. I selected a 3 x 20 Fox SV balloon and perform primary balloon angioplasty of the distal anastomosis and posterior tibial artery. The balloon was taken 14 atmospheres for 2 minutes. Completion angiography revealed resolution of the stenosis. At this point catheters and wires were removed. The sheath was withdrawn to the right external iliac artery. The patient was taken to the holding area for sheath pull once his coagulation profile corrects.  Impression:  #1 high-grade stenosis at the  distal anastomosis and in the native posterior tibial artery just beyond the distal anastomosis. This was successfully dilated using a 3 x 20 balloon with excellent results.  12/01/13 Clinic EKG NSR, LVH, LAE      Assessment and Plan   1. CAD  - no current symptoms  - continue risk factor modification, continue current meds   2. PAD  - continue follow up with vascular  3. Hyperlipidemia  - continue high dose statin  4. HTN -at goal, continue current meds   5. CVA - secondary prevention per neuro, currently on DAPT per there recs  6. Dyspnea - followed by pulmonary, has high resolution CT pending. We have been asked to arrange RHC due to concern for pulm HTN in setting of RA. Given patient's recent CVA and continued recovery he has asked that we postopone the RHC for now, I think this is reasonable. We will f/u in 2 months and readdress.  7. Cough - willl change ACE-I to ARB.      Antoine Poche, M.D.

## 2015-07-06 ENCOUNTER — Telehealth: Payer: Self-pay | Admitting: Internal Medicine

## 2015-07-06 NOTE — Telephone Encounter (Signed)
Called and spoke to pt. Informed pt of the results and recs per MR. Pt verbalized understanding and denied any further questions or concerns at this time.  

## 2015-07-06 NOTE — Telephone Encounter (Signed)
Let [name] know that overnight pulse oximetry test done on room air on 06/29/2015 does not show desaturations. He does not need home oxygen

## 2015-07-07 ENCOUNTER — Ambulatory Visit (HOSPITAL_COMMUNITY)
Admission: RE | Admit: 2015-07-07 | Discharge: 2015-07-07 | Disposition: A | Discharge status: Home / Self Care | Payer: Medicare Other | Source: Ambulatory Visit | Attending: Internal Medicine | Admitting: Internal Medicine

## 2015-07-07 DIAGNOSIS — R918 Other nonspecific abnormal finding of lung field: Secondary | ICD-10-CM | POA: Insufficient documentation

## 2015-07-07 DIAGNOSIS — J479 Bronchiectasis, uncomplicated: Secondary | ICD-10-CM | POA: Diagnosis not present

## 2015-07-07 DIAGNOSIS — J849 Interstitial pulmonary disease, unspecified: Secondary | ICD-10-CM | POA: Diagnosis not present

## 2015-07-07 DIAGNOSIS — N200 Calculus of kidney: Secondary | ICD-10-CM | POA: Diagnosis not present

## 2015-07-07 DIAGNOSIS — R0602 Shortness of breath: Secondary | ICD-10-CM | POA: Diagnosis not present

## 2015-07-07 DIAGNOSIS — K8020 Calculus of gallbladder without cholecystitis without obstruction: Secondary | ICD-10-CM | POA: Insufficient documentation

## 2015-07-07 DIAGNOSIS — R0600 Dyspnea, unspecified: Secondary | ICD-10-CM

## 2015-07-10 ENCOUNTER — Other Ambulatory Visit: Payer: Self-pay | Admitting: Cardiology

## 2015-07-10 ENCOUNTER — Telehealth: Payer: Self-pay | Admitting: Internal Medicine

## 2015-07-10 NOTE — Telephone Encounter (Signed)
Will await till appt for MR to discuss findings per documentation. Will sign off.

## 2015-07-10 NOTE — Telephone Encounter (Signed)
   Seeing patient 07/19/15 and we will discuss the results at that time. CT scan shows some bronchiectasis associated with hiatal hernia and esophageal dysmotility. The bronchiectasis is in the mid and lower zones. Other incidental findings of gallstones and renal stones  Ct Chest High Resolution  07/07/2015  CLINICAL DATA:  Shortness of breath, interstitial lung disease. EXAM: CT CHEST WITHOUT CONTRAST TECHNIQUE: Multidetector CT imaging of the chest was performed following the standard protocol without intravenous contrast. High resolution imaging of the lungs, as well as inspiratory and expiratory imaging, was performed. COMPARISON:  09/26/2010. FINDINGS: Mediastinum/Lymph Nodes: No pathologically enlarged mediastinal or axillary lymph nodes. Hilar regions are difficult to definitively evaluate without IV contrast but appear grossly unremarkable. Heart is mildly enlarged. No pericardial effusion. Tiny hiatal hernia. Esophagus is mildly dilated and air-filled throughout its course, suggesting dysmotility. Lungs/Pleura: There is mid and lower lung zone predominant bronchiectasis, bronchial wall thickening, scattered mucoid impaction and peribronchovascular nodularity. Bronchiolectasis is seen at the lung bases. Volume loss in the lingula. No definite honeycombing. Findings appear largely new from 09/26/2010. No pleural fluid. Airway is unremarkable. No air trapping. Upper abdomen: Visualized portion of the liver is unremarkable. A stone is partially imaged in the gallbladder. Visualized portions of the adrenal glands are unremarkable. Stones are seen in the kidneys bilaterally. Low-attenuation lesion in the right kidney measures 10 mm, incompletely imaged. Visualized portions of the spleen, pancreas and stomach are grossly unremarkable with exception of a tiny hiatal hernia. No upper abdominal adenopathy. Musculoskeletal: No worrisome lytic or sclerotic lesions. Right eleventh rib fracture appears old.  Degenerative changes and fluid are seen in both shoulders. IMPRESSION: 1. Pulmonary parenchymal pattern of bronchiectasis, bronchial wall thickening, scattered mucoid impaction and peribronchovascular nodularity is most indicative of mycobacterium avium complex. 2. Cholelithiasis. 3. Bilateral renal stones. Electronically Signed   By: Leanna Battles M.D.   On: 07/07/2015 16:37

## 2015-07-19 ENCOUNTER — Encounter: Payer: Self-pay | Admitting: Internal Medicine

## 2015-07-19 ENCOUNTER — Ambulatory Visit (INDEPENDENT_AMBULATORY_CARE_PROVIDER_SITE_OTHER): Payer: Medicare Other | Admitting: Internal Medicine

## 2015-07-19 VITALS — BP 110/58 | HR 62 | Ht 66.0 in | Wt 132.0 lb

## 2015-07-19 DIAGNOSIS — I2510 Atherosclerotic heart disease of native coronary artery without angina pectoris: Secondary | ICD-10-CM | POA: Diagnosis not present

## 2015-07-19 DIAGNOSIS — I639 Cerebral infarction, unspecified: Secondary | ICD-10-CM | POA: Diagnosis not present

## 2015-07-19 DIAGNOSIS — I10 Essential (primary) hypertension: Secondary | ICD-10-CM | POA: Diagnosis not present

## 2015-07-19 DIAGNOSIS — R053 Chronic cough: Secondary | ICD-10-CM

## 2015-07-19 DIAGNOSIS — R05 Cough: Secondary | ICD-10-CM

## 2015-07-19 DIAGNOSIS — I69351 Hemiplegia and hemiparesis following cerebral infarction affecting right dominant side: Secondary | ICD-10-CM | POA: Diagnosis not present

## 2015-07-19 DIAGNOSIS — J849 Interstitial pulmonary disease, unspecified: Secondary | ICD-10-CM | POA: Diagnosis not present

## 2015-07-19 NOTE — Progress Notes (Signed)
Subjective:     Patient ID: [name], male   DOB: [birth year], 69 y.o.   MRN: [id number]  HPI  PCP Fernando South, MD  Rheumatologist formally Dr. Zenovia [name] and currently? Dr. Kathi [name] at [facility]  HPI  OV 06/22/2015  Chief Complaint  Patient presents with  . pulmonary consult    Referred by Dr. Gerda [name] for pulmonary fibrosis. Pt states that he is SOB that increases with exertion. Pt states that he does cough, mostly dry, occasional yellow mucus. Pt states that he does wheeze.    69 year old male with long-standing history of rheumatoid arthritis. At baseline a year ago he did not have dyspnea and could walk up with his garage and work with the tools but did limited by pain in his back and his arthritis. Then starting 6 months ago he started having insidious onset of shortness of breath that has become progressive. Currently it is moderate in intensity. Brought on by exertion. Relieved by rest. Notices with exertion for even walking a wheelchair ramp of few to several feet. He still able to do his ADLs but gets limited by shortness of breath which is more than pain. This no  chest pain or edema or hemoptysis or weight loss. However has bad cough esp at night - wants relief  He underwent spirometry which shows FVC 50% recently. I personally visualized the results and the image tracing from 04/30/2015. There is no DLCO.  CT images back in 2009 and 2012 that I personally visualized the show early evidence of ILD and those. However he is not aware of this diagnosis  Echocardiogram end of July 2016 does show elevated pulmonary artery systolic pressure height 36 mmHg   Walking desaturation test today 06/22/1999 1685 feet 3 laps on room air: He was only able to walk half lap. Resting pulse ox was 93% and resting heart rate was 69. At the end of half lap he stopped due to dyspnea but pulse ox remained at 94% and heart rate at 92/38min  ILD relevant history  - from [facility] ILD  question -= Symptoms are severe level V out of 5 dyspnea for the last 6 months. Along with cough at night. Dyspnea is been progressive for the last 3 months = This history of smoking cigarettes from age 62, 20 cigarettes a day and quit at age 63- - denies exposure to humidifier, sound, hot tub, birds but reports positive exposure to mold and water damage and animals - Long-standing rheumatoid arthritis. He used to be on methotrexate in the early 2000. He is not on TNF alpha blockers because of fear of pneumonia. He is on a rib on prednisone for the last 7 years - Strong history of acid reflux documented below = He woke in the textile industry for 38 years but denies exposure to dust or nylon There is some fertilizer exposure and would exposure - In terms of toxic drugs he is on prednisone. In the past used to be on methotrexate. Currently on Arava   OV 07/19/2015  Chief Complaint  Patient presents with  . Follow-up    Pt here after ONO and HRCT. Pt was hospitalized early October for CVA, pt states he has right sided weakness. Pt has not had RHC yet becuase of the CVA. Pt states his breathing overall is about the same. Pt c/o dry cough. Pt denies CP/tightness. .   [name] presents for follow-up with his caretaker friend [name]. Recommend a sedate his grandson but  now just takes him for his medical visits. Since seeing me at last visit 1 month ago for dyspnea associated with rheumatoid arthritis. He did have overnight oxygen test that is reflected below and was normal without desaturations. He did have a high-resolution CT chest that has new finding since 2012. 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 He also has a small hiatal hernia but dilated esophagus with dysmotility. In the interim he's had right-sided stroke that is now made him somewhat disabled. He still able to walk but he is unsteady and is  at high fall risk. Currently dyspnea is not an issue because his activities are limited. He now states that his cough is significant. And he takes Robitussin for cough. He was supposed to have a right heart catheterization based on some elevated pulmonary artery pressures 1 old echo but this is been put on hold because of the stroke by [name]. Appropriately so.  Of note he is on immunosuppression with Arava and prednisone. [name] states that his rheumatoid arthritis is not under good control. His new rheumatologist Dr. Kathi [name] is contemplating switch of his Arava to [facility]       overnight pulse oximetry test done on room air on 06/29/2015 does not show desaturations. He does not need home oxygen   Ct Chest High Resolution  07/07/2015  CLINICAL DATA:  Shortness of breath, interstitial lung disease. EXAM: CT CHEST WITHOUT CONTRAST TECHNIQUE: Multidetector CT imaging of the chest was performed following the standard protocol without intravenous contrast. High resolution imaging of the lungs, as well as inspiratory and expiratory imaging, was performed. COMPARISON:  09/26/2010. FINDINGS: Mediastinum/Lymph Nodes: No pathologically enlarged mediastinal or axillary lymph nodes. Hilar regions are difficult to definitively evaluate without IV contrast but appear grossly unremarkable. Heart is mildly enlarged. No pericardial effusion. Tiny hiatal hernia. Esophagus is mildly dilated and air-filled throughout its course, suggesting dysmotility. Lungs/Pleura: There is mid and lower lung zone predominant bronchiectasis, bronchial wall thickening, scattered mucoid impaction and peribronchovascular nodularity. Bronchiolectasis is seen at the lung bases. Volume loss in the lingula. No definite honeycombing. Findings appear largely new from 09/26/2010. No pleural fluid. Airway is unremarkable. No air trapping. Upper abdomen: Visualized portion of the liver is unremarkable. A stone is partially imaged in the  gallbladder. Visualized portions of the adrenal glands are unremarkable. Stones are seen in the kidneys bilaterally. Low-attenuation lesion in the right kidney measures 10 mm, incompletely imaged. Visualized portions of the spleen, pancreas and stomach are grossly unremarkable with exception of a tiny hiatal hernia. No upper abdominal adenopathy. Musculoskeletal: No worrisome lytic or sclerotic lesions. Right eleventh rib fracture appears old. Degenerative changes and fluid are seen in both shoulders. IMPRESSION: 1. Pulmonary parenchymal pattern of bronchiectasis, bronchial wall thickening, scattered mucoid impaction and peribronchovascular nodularity is most indicative of mycobacterium avium complex. 2. Cholelithiasis. 3. Bilateral renal stones. Electronically Signed   By: Leanna Battles M.D.   On: 07/07/2015 16:37     Immunization History  Administered Date(s) Administered  . Influenza,inj,Quad PF,36+ Mos 07/04/2014, 06/07/2015  . Influenza-Unspecified 06/17/2013  . Pneumococcal Conjugate-13 07/04/2014    Allergies  Allergen Reactions  . Clindamycin/Lincomycin Other (See Comments)    Kidney Failure  . Amoxicillin Hives and Other (See Comments)    Hallucinations   . Ciprofloxacin Other (See Comments)    C-diff  . Other Hives, Diarrhea and Other (See Comments)    "Mycin" Causes C-diff  .  Doxycycline Nausea And Vomiting  . Levofloxacin Nausea And Vomiting     Current outpatient prescriptions:  .  ALPRAZolam (XANAX) 1 MG tablet, TAKE 1 TABLET BY MOUTH ONCE DAILY AS NEEDED FOR ANXIETY., Disp: 30 tablet, Rfl: 5 .  aspirin EC 325 MG tablet, Take 1 tablet (325 mg total) by mouth daily., Disp: 30 tablet, Rfl: 5 .  atenolol (TENORMIN) 25 MG tablet, TAKE (1) TABLET BY MOUTH TWICE DAILY., Disp: 60 tablet, Rfl: 5 .  atorvastatin (LIPITOR) 80 MG tablet, Take 1 tablet (80 mg total) by mouth daily., Disp: 30 tablet, Rfl: 11 .  clopidogrel (PLAVIX) 75 MG tablet, Take 1 tablet (75 mg total) by mouth  daily., Disp: 30 tablet, Rfl: 5 .  finasteride (PROSCAR) 5 MG tablet, Take 5 mg by mouth daily., Disp: , Rfl:  .  folic acid (FOLVITE) 1 MG tablet, Take 1 mg by mouth daily., Disp: , Rfl:  .  furosemide (LASIX) 20 MG tablet, 1/2 to 1 qam for swelling (Patient taking differently: as needed. 1/2 to 1 qam for swelling), Disp: 30 tablet, Rfl: 1 .  HYDROcodone-acetaminophen (NORCO) 10-325 MG tablet, Take 1 tablet up to five times daily, Disp: 70 tablet, Rfl: 0 .  leflunomide (ARAVA) 20 MG tablet, TAKE 2 TABLETS BY MOUTH DAILY, Disp: 180 tablet, Rfl: 0 .  levothyroxine (SYNTHROID, LEVOTHROID) 88 MCG tablet, TAKE ONE TABLET BY MOUTH ONCE DAILY., Disp: 30 tablet, Rfl: 2 .  losartan (COZAAR) 25 MG tablet, Take 1 tablet (25 mg total) by mouth daily., Disp: 30 tablet, Rfl: 3 .  Melatonin 3 MG TABS, Take 3-6 mg by mouth at bedtime as needed. , Disp: , Rfl:  .  Multiple Vitamin (MULTIVITAMIN) capsule, Take 1 capsule by mouth daily., Disp: , Rfl:  .  omeprazole (PRILOSEC OTC) 20 MG tablet, Take 20 mg by mouth 2 (two) times daily., Disp: , Rfl:  .  predniSONE (DELTASONE) 10 MG tablet, TAKE ONE TABLET BY MOUTH ONCE DAILY., Disp: 30 tablet, Rfl: 11 .  tamsulosin (FLOMAX) 0.4 MG CAPS capsule, Take 0.4 mg by mouth daily., Disp: , Rfl:  .  tiZANidine (ZANAFLEX) 4 MG tablet, TAKE 1 TABLET BY MOUTH THREE TIMES DAILY AS NEEDED FOR MUSCLE SPASMS., Disp: 30 tablet, Rfl: 11 .  Vitamin D, Ergocalciferol, (DRISDOL) 50000 UNITS CAPS capsule, Take 50,000 Units by mouth every 7 (seven) days. On Monday., Disp: , Rfl:    Review of Systems   Per HPI    Objective:   Physical Exam  Constitutional: He is oriented to person, place, and time. No distress.  Frail male Sitting on wheel chair Looks more deconditioned than before  HENT:  Head: Normocephalic and atraumatic.  Right Ear: External ear normal.  Left Ear: External ear normal.  Mouth/Throat: Oropharynx is clear and moist. No oropharyngeal exudate.  Eyes:  Conjunctivae and EOM are normal. Pupils are equal, round, and reactive to light. Right eye exhibits no discharge. Left eye exhibits no discharge. No scleral icterus.  Neck: Normal range of motion. Neck supple. No JVD present. No tracheal deviation present. No thyromegaly present.  Cardiovascular: Normal rate, regular rhythm and intact distal pulses.  Exam reveals no gallop and no friction rub.   No murmur heard. Pulmonary/Chest: Effort normal. No respiratory distress. He has no wheezes. He has rales. He exhibits no tenderness.  Scattered crackles  Abdominal: Soft. Bowel sounds are normal. He exhibits no distension and no mass. There is no tenderness. There is no rebound and no guarding.  Musculoskeletal: He  exhibits no edema or tenderness.  Always rheumatoid arthritis deformities in his hands   Lymphadenopathy:    He has no cervical adenopathy.  Neurological: He is alert and oriented to person, place, and time. No cranial nerve deficit. Coordination abnormal.  Slightly weak on the right side  Skin: Skin is warm and dry. No rash noted. He is not diaphoretic. No erythema. No pallor.  Psychiatric: He has a normal mood and affect. His behavior is normal. Judgment and thought content normal.  Nursing note and vitals reviewed.   Filed Vitals:   07/19/15 1607  BP: 110/58  Pulse: 62  Height: 5\' 6"  (1.676 m)  Weight: 132 lb (59.875 kg)  SpO2: 92%        Assessment:       ICD-9-CM ICD-10-CM   1. ILD (interstitial lung disease) (HCC) 515 J84.9   2. Chronic cough 786.2 R05        Plan:      This was initially dyspnea eval but after his stroke dyspnea is no longer the main complaint. Currently cough is. Nevertheless he has interstitial lung disease findings in the setting of rheumatoid arthritis and immunosuppression. His CT findings as described by our thoracic radiologist are more consistent with Mycobacterium avium complex. Especially with him being on immunosuppressants this takes  higher importance particularly when there is consideration to switch his immunosuppressive regimen. I think he needs a bronchoscopy with lavage [transbronchial biopsy will not be of good yield moreover he is on aspirin and Plavix and will put him at bleeding risk] to ensure no opportunistic infections.  For now I've asked him to palliate his cough with Robitussin  I intend to talk to Dr. his rheumatologist before proceeding with bronchoscopy  He and his caretaker [name] or on the same page with this. The extent possible possible he wants to avoid procedures     > 50% of this > 25 min visit spent in face to face counseling or coordination of care   Dr. Marchelle [name], M.D., [facility].C.P Pulmonary and Critical Care Medicine Staff Physician [facility] and Critical Care Pager: [phone], If no answer or between  15:00h - 7:00h: call [phone]  07/19/2015 4:50 PM    PS update  08/23/2015- subsequent to this I d/w patient and he DOES not want bronch. I goit a call few days ago from Dr 08/25/2015 whomet with patient and patient categorical he does not wan bronch. Just wants Rx for RA

## 2015-07-19 NOTE — Patient Instructions (Signed)
ICD-9-CM ICD-10-CM   1. ILD (interstitial lung disease) (HCC) 515 J84.9   2. Chronic cough 786.2 R05    Will talk to Dr Haydee Salter about need for bronchoscpy and get back to you   Followup  - If you do not hear from me in one week please call the office [phone]

## 2015-07-20 ENCOUNTER — Telehealth: Payer: Self-pay | Admitting: Internal Medicine

## 2015-07-20 NOTE — Telephone Encounter (Signed)
ATC Dr. Clarise Cruz office but there was not an answering service nor a pre-recording. WCB later. (phone: [phone])

## 2015-07-20 NOTE — Telephone Encounter (Signed)
[name]  Please have Dr Haydee Salter rheum at [facility] give me a call on my cell  Thanks Dr. Kalman Shan, M.D., [facility].C.P Pulmonary and Critical Care Medicine Staff Physician [facility]monary and Critical Care Pager: [phone], If no answer or between  15:00h - 7:00h: call [phone]  07/20/2015 8:39 AM

## 2015-07-21 NOTE — Telephone Encounter (Signed)
Called [facility] at [phone]. There office is closed. WCB on 10.31.16.

## 2015-07-24 NOTE — Telephone Encounter (Signed)
Called office and was placed on hold for several minutes and never received anyone. WCB

## 2015-07-25 ENCOUNTER — Telehealth: Payer: Self-pay | Admitting: Family Medicine

## 2015-07-25 NOTE — Telephone Encounter (Signed)
Dennis notified on vm ok to extend physical therapy.

## 2015-07-25 NOTE — Telephone Encounter (Signed)
[facility]'s physical therapist called requesting a verbal order to extend patient physical therapy. May we give verbal orders.  Physical Therapist: Maurine Minister Phone number [phone]

## 2015-07-25 NOTE — Telephone Encounter (Signed)
Called and spoke to receptionist at [facility]. Dr. Kathi Ludwig is to call MR's cell phone regarding [name].  Will forward to MR as FYI.

## 2015-07-25 NOTE — Telephone Encounter (Signed)
Ok lets do 

## 2015-07-26 ENCOUNTER — Telehealth: Payer: Self-pay | Admitting: Family Medicine

## 2015-07-26 ENCOUNTER — Other Ambulatory Visit: Payer: Self-pay | Admitting: *Deleted

## 2015-07-26 MED ORDER — LIDOCAINE 5 % EX PTCH: MEDICATED_PATCH | CUTANEOUS | Status: DC

## 2015-07-26 NOTE — Telephone Encounter (Signed)
lidodermpatches numb thirty apply for up to wtwelve hrs to affected area, may cut patch to fit rer prn

## 2015-07-26 NOTE — Telephone Encounter (Signed)
Med sent to pharm. Pt notified.  

## 2015-07-26 NOTE — Telephone Encounter (Signed)
Patient is requesting prescription for pain patches for sores on his bottom from sitting so much from his stroke. Call into [facility] he wants them delivered.

## 2015-07-28 ENCOUNTER — Other Ambulatory Visit: Payer: Self-pay | Admitting: *Deleted

## 2015-07-28 ENCOUNTER — Telehealth: Payer: Self-pay | Admitting: Family Medicine

## 2015-07-28 DIAGNOSIS — L8990 Pressure ulcer of unspecified site, unspecified stage: Secondary | ICD-10-CM

## 2015-07-28 MED ORDER — CEFUROXIME AXETIL 500 MG PO TABS: 500.0000 mg | ORAL_TABLET | Freq: Two times a day (BID) | ORAL | Status: DC

## 2015-07-28 NOTE — Telephone Encounter (Signed)
Pt.notified

## 2015-07-28 NOTE — Addendum Note (Signed)
Addended by: Metro Kung on: 07/28/2015 03:50 PM   Modules accepted: Orders

## 2015-07-28 NOTE — Telephone Encounter (Signed)
[facility] Victorino Dike) called wanting to see if doctor will put in order for a nurse to come out for a visit for the bed sore he has on his bottom, because still hasnt received prescription yet.Hold up with insurance can you call in something else.If possible need order today. Fax to [employer] [phone]

## 2015-07-28 NOTE — Telephone Encounter (Signed)
discused with pt. Med sent to pharm. Order put in and faxed to [facility] for referral.

## 2015-07-28 NOTE — Telephone Encounter (Signed)
1- ceftin 500-1 bid for 7 days, 2- avoid excessive pressure on this area , 3- HH referral for next week to be seen by nurse, 4- f/u ov with Dr Brett Canales if not better over nxt 1to 2 weeks sooner if worse

## 2015-07-28 NOTE — Telephone Encounter (Signed)
Pt called 2 days ago and requesting something for pain for sores on his bottom. Dr Brett Canales sent in lidoderm patches. Insurance would not cover. Called pt to get symptoms and he states it is a scab on bottom that is red no drainage, no fever. Painful to sit on. Physical therapist states it is more like a pressure sore. They are using neosporin can something be called in and have order for home health to come out.

## 2015-07-31 ENCOUNTER — Telehealth: Payer: Self-pay | Admitting: Internal Medicine

## 2015-07-31 NOTE — Telephone Encounter (Signed)
Pt cb please cb at previous numbers listed

## 2015-07-31 NOTE — Telephone Encounter (Signed)
lmomtcb x1 

## 2015-07-31 NOTE — Telephone Encounter (Signed)
D/w Dr syed - she feels that given infiltrates I need to rule out infection for her to do change  In RA immunothreapy. . I can do 11/17 thu morning at [facility] sometime - flex scope, video bronch, BAL only, no bx. No fluoro No TB risk

## 2015-07-31 NOTE — Telephone Encounter (Signed)
Attempted to call pt. No answer. Will call back.

## 2015-08-01 NOTE — Telephone Encounter (Addendum)
Called and spoke to [place] at respiratory. Bronch is scheduled for 11/17 at 0800 at [facility].  LMTCB for pt to inform.   Will send to MR as FYI.

## 2015-08-01 NOTE — Telephone Encounter (Signed)
lmtcb for pt.  

## 2015-08-01 NOTE — Telephone Encounter (Signed)
Pt returning call to [name] about bronch.Caren Griffins

## 2015-08-02 NOTE — Telephone Encounter (Signed)
lmtcb x2 for pt. 

## 2015-08-03 NOTE — Telephone Encounter (Signed)
Spoke with patient- states that he does not want to do the Bronch scheduled for 08/07/15 States that he wishes to follow with Dr Kathi Ludwig his Rheumatologist and be treated with medication. Pt states that he discussed with MR last OV about medication treatment for his condition as an alternate treatment. Please advise if there are any further rec's. Okay to cancel Bronch 11/14?

## 2015-08-04 NOTE — Telephone Encounter (Signed)
Please see phone message on 07/31/2015 for further information. Will sign off.

## 2015-08-04 NOTE — Telephone Encounter (Signed)
Called and spoke to [place] at [facility] and Julieta Bellini has been cancelled. Called and spoke to [name]. 3 month f/u scheduled for 11/05/14. [name] verbalized understanding and denied any further questions or concerns at this time.

## 2015-08-04 NOTE — Telephone Encounter (Signed)
D/w patient [name] - via phone  7:33 AM 08/04/2015 - he said he does NOT want bronch. Fears risk for procedure. Does not care what infitlrates could be. Just wants the new med for RA - he wants Dr Kathi Ludwig to start the RA meds  Plan  =pls call [facility] Dr Kathi Ludwig and let her know the above (patient will also call her) - give patient fu to see me in 3 months - cancel bronch 08/10/15  Dr. Kalman Shan, M.D., [facility].C.P Pulmonary and Critical Care Medicine Staff Physician [facility] Pulmonary and Critical Care Pager: [phone], If no answer or between  15:00h - 7:00h: call [phone]  08/04/2015 7:36 AM

## 2015-08-07 ENCOUNTER — Ambulatory Visit (INDEPENDENT_AMBULATORY_CARE_PROVIDER_SITE_OTHER): Payer: Medicare Other | Admitting: Family Medicine

## 2015-08-07 ENCOUNTER — Encounter: Payer: Self-pay | Admitting: Family Medicine

## 2015-08-07 VITALS — BP 122/78 | Ht 63.0 in | Wt 136.0 lb

## 2015-08-07 DIAGNOSIS — I639 Cerebral infarction, unspecified: Secondary | ICD-10-CM | POA: Diagnosis not present

## 2015-08-07 DIAGNOSIS — J8410 Pulmonary fibrosis, unspecified: Secondary | ICD-10-CM

## 2015-08-07 DIAGNOSIS — I10 Essential (primary) hypertension: Secondary | ICD-10-CM | POA: Diagnosis not present

## 2015-08-07 DIAGNOSIS — I679 Cerebrovascular disease, unspecified: Secondary | ICD-10-CM | POA: Diagnosis not present

## 2015-08-07 MED ORDER — HYDROCODONE-ACETAMINOPHEN 10-325 MG PO TABS: ORAL_TABLET | ORAL | Status: DC

## 2015-08-07 NOTE — Progress Notes (Signed)
   Subjective:    Patient ID: [name], male    DOB: November 25, 1945, 69 y.o.   MRN: [id number]  HPI Patient arrives for a follow up on recent stroke. Patient states he is making progress but very slow. Right hand somewhat stronger right arm somewhat stronger. Right hand somewhat more coordinated but not where he once to be. Currently receiving physical therapy and occupational therapy   Patient states he found out from pulmonary doctor that he has pulmonary fibrosis  Specialists has initiated some new medication for him to try, patient cannot remember name currently.   Patient states compliant blood pressure medicine. Meds reviewed today. No obvious side effects.  Patient now using Lidoderm patch. States it helps his back pain.  and is waiting to see new rheumatologist.   Review of Systems No headache no chest pain chronic joint pain no abdominal pain no change in bowel habits ROS otherwise negative    Objective:   Physical Exam Alert no acute distress blood pressure good on repeat lungs diminished breath sounds no acute wheezes no tachypnea heart regular rate and rhythm abdomen soft right arm and hand strength improved but still diminished. Right hand clumsy with fine motor control       Assessment & Plan:  Impression 1 status post stroke discussed slowly improving #2 hypertension controlled discussed medications good #3 pulmonary fibrosis patient due to see spell specialist #4 rheumatoid arthritis with chronic pain we maintain patient's medications plan diet discussed exercise discussed maintain same medications. Follow-up as scheduled. WSL

## 2015-08-10 ENCOUNTER — Encounter (HOSPITAL_COMMUNITY): Payer: Medicare Other

## 2015-08-10 ENCOUNTER — Encounter (HOSPITAL_COMMUNITY): Admission: RE | Payer: Self-pay | Source: Ambulatory Visit

## 2015-08-10 ENCOUNTER — Ambulatory Visit (HOSPITAL_COMMUNITY): Admission: RE | Admit: 2015-08-10 | Payer: Medicare Other | Source: Ambulatory Visit | Admitting: Internal Medicine

## 2015-08-10 SURGERY — VIDEO BRONCHOSCOPY WITHOUT FLUORO
Anesthesia: Moderate Sedation | Laterality: Bilateral

## 2015-08-12 ENCOUNTER — Other Ambulatory Visit: Payer: Self-pay | Admitting: Cardiology

## 2015-08-12 ENCOUNTER — Other Ambulatory Visit: Payer: Self-pay | Admitting: Family Medicine

## 2015-08-14 NOTE — Telephone Encounter (Signed)
No. This needs to be rx'ed by specialist

## 2015-08-24 ENCOUNTER — Encounter: Payer: Self-pay | Admitting: Internal Medicine

## 2015-08-30 ENCOUNTER — Ambulatory Visit (INDEPENDENT_AMBULATORY_CARE_PROVIDER_SITE_OTHER): Payer: Medicare Other | Admitting: Family Medicine

## 2015-08-30 ENCOUNTER — Encounter: Payer: Self-pay | Admitting: Family Medicine

## 2015-08-30 VITALS — BP 136/78 | Temp 98.3°F | Ht 63.0 in | Wt 133.0 lb

## 2015-08-30 DIAGNOSIS — J019 Acute sinusitis, unspecified: Secondary | ICD-10-CM

## 2015-08-30 DIAGNOSIS — D692 Other nonthrombocytopenic purpura: Secondary | ICD-10-CM

## 2015-08-30 DIAGNOSIS — B9689 Other specified bacterial agents as the cause of diseases classified elsewhere: Secondary | ICD-10-CM

## 2015-08-30 DIAGNOSIS — I639 Cerebral infarction, unspecified: Secondary | ICD-10-CM | POA: Diagnosis not present

## 2015-08-30 MED ORDER — SULFAMETHOXAZOLE-TRIMETHOPRIM 800-160 MG PO TABS: 1.0000 | ORAL_TABLET | Freq: Two times a day (BID) | ORAL | Status: DC

## 2015-08-30 NOTE — Progress Notes (Addendum)
   Subjective:    Patient ID: [name], male    DOB: [birth year], 69 y.o.   MRN: [id number]  Cough This is a new problem. Episode onset: 4 days ago. Associated symptoms include nasal congestion and wheezing. Treatments tried: cough syrup.    patient states he had a tooth pulled it left a hole between his gum in his left sinuses because of this persistent sinus infection has been going on off and on he is been seen by dental specialist several times without much success   Review of Systems  Respiratory: Positive for cough and wheezing.     not rest or distress no vomiting or diarrhea denies fever chills relates left side of face sinus pressure    Objective:   Physical Exam   patient with severe arthritis crackles in the lungs consistent with pulmonary fibrosis not respiratory distress moderate sinus tenderness worse on the left side   I'm not noting any wheezing on today's exam    Assessment & Plan:   pulmonary fibrosis noted O2 sat good   acute rhinosinusitis antibiotics prescribed warning signs discussed follow-up if problems  Senile purpura noted no bleeding issues

## 2015-09-02 ENCOUNTER — Emergency Department (HOSPITAL_COMMUNITY): Payer: Medicare Other

## 2015-09-02 ENCOUNTER — Encounter (HOSPITAL_COMMUNITY): Payer: Self-pay | Admitting: *Deleted

## 2015-09-02 ENCOUNTER — Inpatient Hospital Stay (HOSPITAL_COMMUNITY)
Admission: EM | Admit: 2015-09-02 | Discharge: 2015-09-07 | DRG: 545 | Disposition: A | Discharge status: Home Health | Payer: Medicare Other | Attending: Internal Medicine | Admitting: Internal Medicine

## 2015-09-02 DIAGNOSIS — R1013 Epigastric pain: Secondary | ICD-10-CM | POA: Diagnosis not present

## 2015-09-02 DIAGNOSIS — I959 Hypotension, unspecified: Secondary | ICD-10-CM

## 2015-09-02 DIAGNOSIS — R1030 Lower abdominal pain, unspecified: Secondary | ICD-10-CM | POA: Diagnosis not present

## 2015-09-02 DIAGNOSIS — Z7902 Long term (current) use of antithrombotics/antiplatelets: Secondary | ICD-10-CM | POA: Diagnosis not present

## 2015-09-02 DIAGNOSIS — D538 Other specified nutritional anemias: Secondary | ICD-10-CM | POA: Diagnosis not present

## 2015-09-02 DIAGNOSIS — I130 Hypertensive heart and chronic kidney disease with heart failure and stage 1 through stage 4 chronic kidney disease, or unspecified chronic kidney disease: Secondary | ICD-10-CM | POA: Diagnosis present

## 2015-09-02 DIAGNOSIS — G894 Chronic pain syndrome: Secondary | ICD-10-CM | POA: Diagnosis present

## 2015-09-02 DIAGNOSIS — D638 Anemia in other chronic diseases classified elsewhere: Secondary | ICD-10-CM | POA: Diagnosis present

## 2015-09-02 DIAGNOSIS — L89152 Pressure ulcer of sacral region, stage 2: Secondary | ICD-10-CM | POA: Diagnosis present

## 2015-09-02 DIAGNOSIS — I2510 Atherosclerotic heart disease of native coronary artery without angina pectoris: Secondary | ICD-10-CM | POA: Diagnosis not present

## 2015-09-02 DIAGNOSIS — I4891 Unspecified atrial fibrillation: Secondary | ICD-10-CM | POA: Diagnosis present

## 2015-09-02 DIAGNOSIS — D696 Thrombocytopenia, unspecified: Secondary | ICD-10-CM | POA: Diagnosis present

## 2015-09-02 DIAGNOSIS — Z7952 Long term (current) use of systemic steroids: Secondary | ICD-10-CM | POA: Diagnosis not present

## 2015-09-02 DIAGNOSIS — I272 Other secondary pulmonary hypertension: Secondary | ICD-10-CM | POA: Diagnosis present

## 2015-09-02 DIAGNOSIS — N189 Chronic kidney disease, unspecified: Secondary | ICD-10-CM | POA: Diagnosis present

## 2015-09-02 DIAGNOSIS — Z87891 Personal history of nicotine dependence: Secondary | ICD-10-CM

## 2015-09-02 DIAGNOSIS — R109 Unspecified abdominal pain: Secondary | ICD-10-CM | POA: Insufficient documentation

## 2015-09-02 DIAGNOSIS — Z8249 Family history of ischemic heart disease and other diseases of the circulatory system: Secondary | ICD-10-CM | POA: Diagnosis not present

## 2015-09-02 DIAGNOSIS — E785 Hyperlipidemia, unspecified: Secondary | ICD-10-CM | POA: Diagnosis present

## 2015-09-02 DIAGNOSIS — J8410 Pulmonary fibrosis, unspecified: Secondary | ICD-10-CM | POA: Diagnosis present

## 2015-09-02 DIAGNOSIS — D649 Anemia, unspecified: Secondary | ICD-10-CM | POA: Diagnosis not present

## 2015-09-02 DIAGNOSIS — I739 Peripheral vascular disease, unspecified: Secondary | ICD-10-CM | POA: Diagnosis present

## 2015-09-02 DIAGNOSIS — E869 Volume depletion, unspecified: Secondary | ICD-10-CM | POA: Diagnosis present

## 2015-09-02 DIAGNOSIS — J984 Other disorders of lung: Secondary | ICD-10-CM | POA: Diagnosis not present

## 2015-09-02 DIAGNOSIS — R296 Repeated falls: Secondary | ICD-10-CM | POA: Diagnosis present

## 2015-09-02 DIAGNOSIS — I638 Other cerebral infarction: Secondary | ICD-10-CM | POA: Diagnosis not present

## 2015-09-02 DIAGNOSIS — R52 Pain, unspecified: Secondary | ICD-10-CM

## 2015-09-02 DIAGNOSIS — I214 Non-ST elevation (NSTEMI) myocardial infarction: Secondary | ICD-10-CM | POA: Diagnosis present

## 2015-09-02 DIAGNOSIS — K210 Gastro-esophageal reflux disease with esophagitis: Secondary | ICD-10-CM | POA: Diagnosis present

## 2015-09-02 DIAGNOSIS — Z800 Family history of malignant neoplasm of digestive organs: Secondary | ICD-10-CM

## 2015-09-02 DIAGNOSIS — E039 Hypothyroidism, unspecified: Secondary | ICD-10-CM | POA: Diagnosis present

## 2015-09-02 DIAGNOSIS — Z79899 Other long term (current) drug therapy: Secondary | ICD-10-CM | POA: Diagnosis not present

## 2015-09-02 DIAGNOSIS — Z951 Presence of aortocoronary bypass graft: Secondary | ICD-10-CM

## 2015-09-02 DIAGNOSIS — E119 Type 2 diabetes mellitus without complications: Secondary | ICD-10-CM

## 2015-09-02 DIAGNOSIS — N179 Acute kidney failure, unspecified: Secondary | ICD-10-CM | POA: Diagnosis present

## 2015-09-02 DIAGNOSIS — Z96651 Presence of right artificial knee joint: Secondary | ICD-10-CM | POA: Diagnosis present

## 2015-09-02 DIAGNOSIS — I252 Old myocardial infarction: Secondary | ICD-10-CM

## 2015-09-02 DIAGNOSIS — W19XXXA Unspecified fall, initial encounter: Secondary | ICD-10-CM

## 2015-09-02 DIAGNOSIS — I639 Cerebral infarction, unspecified: Secondary | ICD-10-CM | POA: Diagnosis present

## 2015-09-02 DIAGNOSIS — Z7982 Long term (current) use of aspirin: Secondary | ICD-10-CM | POA: Diagnosis not present

## 2015-09-02 DIAGNOSIS — I5032 Chronic diastolic (congestive) heart failure: Secondary | ICD-10-CM | POA: Diagnosis present

## 2015-09-02 DIAGNOSIS — E1122 Type 2 diabetes mellitus with diabetic chronic kidney disease: Secondary | ICD-10-CM | POA: Diagnosis present

## 2015-09-02 DIAGNOSIS — R531 Weakness: Secondary | ICD-10-CM | POA: Diagnosis present

## 2015-09-02 DIAGNOSIS — Z8673 Personal history of transient ischemic attack (TIA), and cerebral infarction without residual deficits: Secondary | ICD-10-CM | POA: Diagnosis not present

## 2015-09-02 DIAGNOSIS — M069 Rheumatoid arthritis, unspecified: Secondary | ICD-10-CM | POA: Diagnosis present

## 2015-09-02 DIAGNOSIS — E2749 Other adrenocortical insufficiency: Secondary | ICD-10-CM | POA: Diagnosis present

## 2015-09-02 DIAGNOSIS — J449 Chronic obstructive pulmonary disease, unspecified: Secondary | ICD-10-CM | POA: Diagnosis present

## 2015-09-02 DIAGNOSIS — D62 Acute posthemorrhagic anemia: Secondary | ICD-10-CM | POA: Diagnosis present

## 2015-09-02 DIAGNOSIS — R1011 Right upper quadrant pain: Secondary | ICD-10-CM

## 2015-09-02 DIAGNOSIS — Z806 Family history of leukemia: Secondary | ICD-10-CM | POA: Diagnosis not present

## 2015-09-02 DIAGNOSIS — L8990 Pressure ulcer of unspecified site, unspecified stage: Secondary | ICD-10-CM | POA: Insufficient documentation

## 2015-09-02 LAB — URINALYSIS, ROUTINE W REFLEX MICROSCOPIC
BILIRUBIN URINE: NEGATIVE
GLUCOSE, UA: NEGATIVE mg/dL
Ketones, ur: NEGATIVE mg/dL
Leukocytes, UA: NEGATIVE
Nitrite: NEGATIVE
PROTEIN: 30 mg/dL — AB
Specific Gravity, Urine: 1.025 (ref 1.005–1.030)
pH: 5.5 (ref 5.0–8.0)

## 2015-09-02 LAB — COMPREHENSIVE METABOLIC PANEL
ALT: 34 U/L (ref 17–63)
AST: 100 U/L — AB (ref 15–41)
Albumin: 2.5 g/dL — ABNORMAL LOW (ref 3.5–5.0)
Alkaline Phosphatase: 116 U/L (ref 38–126)
Anion gap: 12 (ref 5–15)
BILIRUBIN TOTAL: 0.7 mg/dL (ref 0.3–1.2)
BUN: 45 mg/dL — AB (ref 6–20)
CO2: 18 mmol/L — ABNORMAL LOW (ref 22–32)
CREATININE: 3.5 mg/dL — AB (ref 0.61–1.24)
Calcium: 6.5 mg/dL — ABNORMAL LOW (ref 8.9–10.3)
Chloride: 110 mmol/L (ref 101–111)
GFR, EST AFRICAN AMERICAN: 19 mL/min — AB (ref >60)
GFR, EST NON AFRICAN AMERICAN: 16 mL/min — AB (ref >60)
Glucose, Bld: 113 mg/dL — ABNORMAL HIGH (ref 65–99)
Potassium: 4.7 mmol/L (ref 3.5–5.1)
Sodium: 140 mmol/L (ref 135–145)
TOTAL PROTEIN: 5.9 g/dL — AB (ref 6.5–8.1)

## 2015-09-02 LAB — CBC WITH DIFFERENTIAL/PLATELET
BASOS ABS: 0 10*3/uL (ref 0.0–0.1)
Basophils Relative: 0 %
Eosinophils Absolute: 0.2 10*3/uL (ref 0.0–0.7)
Eosinophils Relative: 1 %
HEMATOCRIT: 24.3 % — AB (ref 39.0–52.0)
Hemoglobin: 7.6 g/dL — ABNORMAL LOW (ref 13.0–17.0)
LYMPHS ABS: 1.3 10*3/uL (ref 0.7–4.0)
LYMPHS PCT: 8 %
MCH: 29.1 pg (ref 26.0–34.0)
MCHC: 31.3 g/dL (ref 30.0–36.0)
MCV: 93.1 fL (ref 78.0–100.0)
MONO ABS: 0.6 10*3/uL (ref 0.1–1.0)
Monocytes Relative: 4 %
NEUTROS ABS: 13.4 10*3/uL — AB (ref 1.7–7.7)
Neutrophils Relative %: 87 %
Platelets: 131 10*3/uL — ABNORMAL LOW (ref 150–400)
RBC: 2.61 MIL/uL — AB (ref 4.22–5.81)
RDW: 18.9 % — AB (ref 11.5–15.5)
WBC: 15.5 10*3/uL — ABNORMAL HIGH (ref 4.0–10.5)

## 2015-09-02 LAB — APTT: APTT: 34 s (ref 24–37)

## 2015-09-02 LAB — PROTIME-INR
INR: 1.1 (ref 0.00–1.49)
Prothrombin Time: 14.4 seconds (ref 11.6–15.2)

## 2015-09-02 LAB — I-STAT CHEM 8, ED
BUN: 44 mg/dL — AB (ref 6–20)
CREATININE: 3.3 mg/dL — AB (ref 0.61–1.24)
Calcium, Ion: 0.54 mmol/L — CL (ref 1.13–1.30)
Chloride: 115 mmol/L — ABNORMAL HIGH (ref 101–111)
GLUCOSE: 92 mg/dL (ref 65–99)
HCT: 23 % — ABNORMAL LOW (ref 39.0–52.0)
HEMOGLOBIN: 7.8 g/dL — AB (ref 13.0–17.0)
POTASSIUM: 4.5 mmol/L (ref 3.5–5.1)
Sodium: 139 mmol/L (ref 135–145)
TCO2: 17 mmol/L (ref 0–100)

## 2015-09-02 LAB — HEMOGLOBIN AND HEMATOCRIT, BLOOD
HCT: 24.9 % — ABNORMAL LOW (ref 39.0–52.0)
HEMOGLOBIN: 7.9 g/dL — AB (ref 13.0–17.0)

## 2015-09-02 LAB — I-STAT CG4 LACTIC ACID, ED
LACTIC ACID, VENOUS: 1.51 mmol/L (ref 0.5–2.0)
Lactic Acid, Venous: 2.26 mmol/L (ref 0.5–2.0)

## 2015-09-02 LAB — URINE MICROSCOPIC-ADD ON

## 2015-09-02 LAB — TROPONIN I: TROPONIN I: 9.24 ng/mL — AB (ref <0.031)

## 2015-09-02 LAB — PREPARE RBC (CROSSMATCH)

## 2015-09-02 LAB — ABO/RH: ABO/RH(D): A POS

## 2015-09-02 LAB — GLUCOSE, CAPILLARY: GLUCOSE-CAPILLARY: 92 mg/dL (ref 65–99)

## 2015-09-02 MED ORDER — HYDROCODONE-ACETAMINOPHEN 10-325 MG PO TABS
1.0000 | ORAL_TABLET | ORAL | Status: DC | PRN
  Administered 2015-09-03 – 2015-09-07 (×18): 1 via ORAL
  Filled 2015-09-02 (×19): qty 1

## 2015-09-02 MED ORDER — LEVOTHYROXINE SODIUM 88 MCG PO TABS
88.0000 ug | ORAL_TABLET | Freq: Every day | ORAL | Status: DC
  Administered 2015-09-03 – 2015-09-07 (×5): 88 ug via ORAL
  Filled 2015-09-02 (×5): qty 1

## 2015-09-02 MED ORDER — PANTOPRAZOLE SODIUM 40 MG IV SOLR
40.0000 mg | Freq: Two times a day (BID) | INTRAVENOUS | Status: DC
  Administered 2015-09-02: 40 mg via INTRAVENOUS
  Filled 2015-09-02: qty 40

## 2015-09-02 MED ORDER — TAMSULOSIN HCL 0.4 MG PO CAPS
0.4000 mg | ORAL_CAPSULE | Freq: Every day | ORAL | Status: DC
  Administered 2015-09-02 – 2015-09-07 (×6): 0.4 mg via ORAL
  Filled 2015-09-02 (×6): qty 1

## 2015-09-02 MED ORDER — SODIUM CHLORIDE 0.9 % IV BOLUS (SEPSIS)
1000.0000 mL | Freq: Once | INTRAVENOUS | Status: AC
  Administered 2015-09-02: 1000 mL via INTRAVENOUS

## 2015-09-02 MED ORDER — LIDOCAINE 5 % EX PTCH
1.0000 | MEDICATED_PATCH | CUTANEOUS | Status: DC
  Administered 2015-09-03 – 2015-09-06 (×5): 1 via TRANSDERMAL
  Filled 2015-09-02 (×6): qty 1

## 2015-09-02 MED ORDER — HYDROCODONE-ACETAMINOPHEN 5-325 MG PO TABS
1.0000 | ORAL_TABLET | Freq: Once | ORAL | Status: AC
  Administered 2015-09-02: 1 via ORAL
  Filled 2015-09-02: qty 1

## 2015-09-02 MED ORDER — SODIUM CHLORIDE 0.9 % IV SOLN
10.0000 mL/h | Freq: Once | INTRAVENOUS | Status: AC
  Administered 2015-09-02: 10 mL/h via INTRAVENOUS

## 2015-09-02 MED ORDER — ATENOLOL 25 MG PO TABS
25.0000 mg | ORAL_TABLET | Freq: Two times a day (BID) | ORAL | Status: DC
  Administered 2015-09-02 – 2015-09-07 (×10): 25 mg via ORAL
  Filled 2015-09-02 (×10): qty 1

## 2015-09-02 MED ORDER — VITAMIN D (ERGOCALCIFEROL) 1.25 MG (50000 UNIT) PO CAPS
50000.0000 [IU] | ORAL_CAPSULE | ORAL | Status: DC
  Filled 2015-09-02: qty 1

## 2015-09-02 MED ORDER — FOLIC ACID 1 MG PO TABS
1.0000 mg | ORAL_TABLET | Freq: Every day | ORAL | Status: DC
  Administered 2015-09-03 – 2015-09-07 (×5): 1 mg via ORAL
  Filled 2015-09-02 (×5): qty 1

## 2015-09-02 MED ORDER — SODIUM CHLORIDE 0.9 % IV BOLUS (SEPSIS): 1000.0000 mL | Freq: Once | INTRAVENOUS | Status: DC

## 2015-09-02 MED ORDER — ATORVASTATIN CALCIUM 40 MG PO TABS
80.0000 mg | ORAL_TABLET | Freq: Every day | ORAL | Status: DC
  Administered 2015-09-03 – 2015-09-07 (×5): 80 mg via ORAL
  Filled 2015-09-02 (×5): qty 2

## 2015-09-02 MED ORDER — FINASTERIDE 5 MG PO TABS
5.0000 mg | ORAL_TABLET | Freq: Every day | ORAL | Status: DC
  Administered 2015-09-03 – 2015-09-07 (×7): 5 mg via ORAL
  Filled 2015-09-02 (×8): qty 1

## 2015-09-02 MED ORDER — HYDROCORTISONE NA SUCCINATE PF 100 MG IJ SOLR
50.0000 mg | Freq: Three times a day (TID) | INTRAMUSCULAR | Status: AC
  Administered 2015-09-02 – 2015-09-03 (×3): 50 mg via INTRAVENOUS
  Filled 2015-09-02 (×3): qty 2

## 2015-09-02 MED ORDER — ALPRAZOLAM 1 MG PO TABS
1.0000 mg | ORAL_TABLET | Freq: Three times a day (TID) | ORAL | Status: DC | PRN
  Administered 2015-09-02 – 2015-09-07 (×9): 1 mg via ORAL
  Filled 2015-09-02 (×2): qty 1
  Filled 2015-09-02 (×4): qty 2
  Filled 2015-09-02: qty 1
  Filled 2015-09-02 (×2): qty 2

## 2015-09-02 MED ORDER — SODIUM CHLORIDE 0.9 % IJ SOLN
3.0000 mL | Freq: Two times a day (BID) | INTRAMUSCULAR | Status: DC
  Administered 2015-09-03 – 2015-09-06 (×8): 3 mL via INTRAVENOUS

## 2015-09-02 NOTE — ED Provider Notes (Signed)
CSN: [id number]     Arrival date & time 09/02/15  1239 History   First MD Initiated Contact with Patient 09/02/15 1324     Chief Complaint  Patient presents with  . Fall     (Consider location/radiation/quality/duration/timing/severity/associated sxs/prior Treatment) HPI 69 year old male who presents with fall and generalized weakness. History of CVA with residual right-sided weakness on Plavix, atherosclerotic cardiovascular disease status post CABG, hypertension, hyperlipidemia, type 2 diabetes, atrial fibrillation. History is primarily provided by the patient's family who states that he has had decreased by mouth intake and generalized weakness progressive over the course of the past week. He has had 2 falls, the most recent was yesterday while getting off of the commode. He states that he had lost balance and hit his head. He did not have any loss of consciousness, nausea or vomiting, confusion, syncope or near syncope, chest pain, difficulty breathing, vision or speech changes, new numbness or weakness, abdominal pain, back pain, or neck pain. Family brought him into the emergency department for evaluation today given his decline. Denies any melena, hematochezia, fevers, chills, dysuria, cough, or difficulty breathing. Past Medical History  Diagnosis Date  . 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 Hyperlipidemia   . Hypertension   . PVD (peripheral vascular disease) (HCC)     Left iliac PCI/stent  . Cerebrovascular disease   . Tobacco abuse, in remission     Remote  . GERD (gastroesophageal reflux disease)   . Hypothyroid   . Allergic rhinitis   . Chronic lung disease     ?  Rheumatoid lung; ?  Adverse reaction to methotrexate  . Schatzki's ring   . Hx of Clostridium difficile infection     Recurrent; associated 40 pound weight loss  . Achalasia   . Diabetes mellitus     borderline no meds  . Atrial fibrillation (HCC)   . Chronic back pain   .  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 Cervical spondylosis   . DDD (degenerative disc disease), lumbar   . Pneumonia   . Carotid artery occlusion   . Nephrolithiasis 2010    s/p lithotripsy  . Hiatal hernia    Past Surgical History  Procedure Laterality Date  . Coronary artery bypass graft      x6 In 1998  . Total knee arthroplasty      Right  . Ankle fusion      Left  . Wrist fusion      Right  . Shoulder surgery    . Colonoscopy  09/12/2011    Dr. Elly Modena hemorrhoids, normal colon and distal terminal ileum. Random bx negative. Stool for CDiff positive.  . Esophagogastroduodenoscopy  09/13/2011    Dr. Lyndle Herrlich plawues mid-esophagus KOH negative. Distal esophageal ring and ulcer. Mild gastritis. duodenal diverticulum. Savaory dilation 16mm.   Gaspar Bidding dilation  09/13/2011  . Esophageal manometry  09/30/2011    Procedure: ESOPHAGEAL MANOMETRY (EM);  Surgeon: Rob Bunting, MD;  Location: [facility];  Service: Endoscopy;  Laterality: N/A;  . Esophagogastroduodenoscopy  09/12/2011    Dr. Rinaldo Ratel rings s/p dilation  . Cardiac surgery    . Esophagomyotomy  11/12/11    [facility]- with DOR antireflux surgery  . Esophagogastroduodenoscopy  06/25/2012    Procedure: ESOPHAGOGASTRODUODENOSCOPY (EGD);  Surgeon: Corbin Ade, MD;  Location: [facility];  Service: Endoscopy;  Laterality: N/A;  7:30  . Savory dilation  06/25/2012    Procedure: SAVORY DILATION;  Surgeon:  Corbin Ade, MD;  Location: [facility];  Service: Endoscopy;  Laterality: N/A;  Elease Hashimoto dilation  06/25/2012    Procedure: Elease Hashimoto DILATION;  Surgeon: Corbin Ade, MD;  Location: [facility];  Service: Endoscopy;  Laterality: N/A;  . Ankle fusion  09/01/2012    Procedure: ANKLE FUSION;  Surgeon: Valeria Batman, MD;  Location: [facility] OR;  Service: Orthopedics;  Laterality: Left;  Take down of angulated tibial/fibula Fractures  . Femoral-tibial bypass graft Left 01/28/2013     Procedure: BYPASS GRAFT FEMORAL-TIBIAL ARTERY;  Surgeon: Nada Libman, MD;  Location: [facility] OR;  Service: Vascular;  Laterality: Left;  Ultrasound Guided  . Amputation Left 01/28/2013    Procedure: AMPUTATION LEFT 5TH TOE;  Surgeon: Nada Libman, MD;  Location: [facility] OR;  Service: Vascular;  Laterality: Left;  . Spine surgery  11-03-13  . Abdominal aortagram N/A 01/01/2013    Procedure: ABDOMINAL Ronny Flurry;  Surgeon: Sherren Kerns, MD;  Location: [facility] CATH LAB;  Service: Cardiovascular;  Laterality: N/A;  . Abdominal aortagram N/A 10/05/2013    Procedure: ABDOMINAL AORTAGRAM;  Surgeon: Nada Libman, MD;  Location: [facility] CATH LAB;  Service: Cardiovascular;  Laterality: N/A;  . Lower extremity angiogram Left 10/05/2013    Procedure: LOWER EXTREMITY ANGIOGRAM;  Surgeon: Nada Libman, MD;  Location: [facility] CATH LAB;  Service: Cardiovascular;  Laterality: Left;  . Abdominal aortagram N/A 11/01/2014    Procedure: ABDOMINAL Ronny Flurry;  Surgeon: Nada Libman, MD;  Location: [facility] CATH LAB;  Service: Cardiovascular;  Laterality: N/A;  . Joint replacement Right 1999    Knee  . Toe amputation Left 09/05/2014    Removed left fifth toe  . Cataract extraction w/phaco Left 12/22/2014    Procedure: CATARACT EXTRACTION PHACO AND INTRAOCULAR LENS PLACEMENT (IOC);  Surgeon: Gemma Payor, MD;  Location: [facility] ORS;  Service: Ophthalmology;  Laterality: Left;  CDE:11.89  . Cataract extraction w/phaco Right 01/30/2015    Procedure: CATARACT EXTRACTION PHACO AND INTRAOCULAR LENS PLACEMENT RIGHT EYE;  Surgeon: Gemma Payor, MD;  Location: [facility] ORS;  Service: Ophthalmology;  Laterality: Right;  CDE:10.56  . Eye surgery Left December 22, 2014    Cataract  . Eye surgery Right December 31, 2014    Cataract   Family History  Problem Relation Age of Onset  . Stomach cancer Mother 74  . Cancer Mother     Stomach  . Heart disease Mother   . Heart attack Mother   . Heart attack Father   . Heart disease Father   . Heart disease Brother   .  Leukemia Brother   . Lung disease Son     infant  . Lung disease Daughter     infant   Social History  Substance Use Topics  . Smoking status: Former Smoker -- 0.50 packs/day for 5 years    Types: Cigarettes    Start date: 09/23/1956    Quit date: 09/23/1965  . Smokeless tobacco: Former Neurosurgeon  . Alcohol Use: No    Review of Systems 10/14 systems reviewed and are negative other than those stated in the HPI    Allergies  Clindamycin/lincomycin; Amoxicillin; Ciprofloxacin; Other; Ceftin; Doxycycline; and Levofloxacin  Home Medications   Prior to Admission medications   Medication Sig Start Date End Date Taking? Authorizing Provider  ALPRAZolam (XANAX) 1 MG tablet TAKE 1 TABLET BY MOUTH ONCE DAILY AS NEEDED FOR ANXIETY. 05/11/15  Yes Merlyn Albert, MD  aspirin EC 325 MG tablet Take 1  tablet (325 mg total) by mouth daily. 06/26/15  Yes Houston Siren, MD  atenolol (TENORMIN) 25 MG tablet TAKE (1) TABLET BY MOUTH TWICE DAILY. 04/12/15  Yes Merlyn Albert, MD  atorvastatin (LIPITOR) 80 MG tablet TAKE ONE TABLET BY MOUTH ONCE DAILY. 08/14/15  Yes Antoine Poche, MD  clopidogrel (PLAVIX) 75 MG tablet Take 1 tablet (75 mg total) by mouth daily. 06/26/15  Yes Houston Siren, MD  finasteride (PROSCAR) 5 MG tablet Take 5 mg by mouth daily.   Yes Historical Provider, MD  folic acid (FOLVITE) 1 MG tablet Take 1 mg by mouth daily.   Yes Historical Provider, MD  furosemide (LASIX) 20 MG tablet 1/2 to 1 qam for swelling Patient taking differently: as needed. 1/2 to 1 qam for swelling 04/11/15  Yes Babs Sciara, MD  HYDROcodone-acetaminophen [facility]) 10-325 MG tablet Take 1 tablet up to five times daily 08/07/15  Yes Merlyn Albert, MD  leflunomide (ARAVA) 20 MG tablet TAKE 2 TABLETS BY MOUTH DAILY 05/12/15  Yes Merlyn Albert, MD  levothyroxine (SYNTHROID, LEVOTHROID) 88 MCG tablet TAKE ONE TABLET BY MOUTH ONCE DAILY. 06/09/15  Yes Merlyn Albert, MD  lidocaine (LIDODERM) 5 % Apply one patch up to  12 hours to affected area. 07/26/15  Yes Merlyn Albert, MD  lisinopril (PRINIVIL,ZESTRIL) 20 MG tablet Take 20 mg by mouth daily.  07/02/15  Yes Historical Provider, MD  losartan (COZAAR) 25 MG tablet Take 1 tablet (25 mg total) by mouth daily. 07/05/15  Yes Antoine Poche, MD  Melatonin 3 MG TABS Take 3-6 mg by mouth at bedtime as needed.    Yes Historical Provider, MD  Multiple Vitamin (MULTIVITAMIN) capsule Take 1 capsule by mouth daily.   Yes Historical Provider, MD  omeprazole (PRILOSEC OTC) 20 MG tablet Take 20 mg by mouth 2 (two) times daily.   Yes Historical Provider, MD  predniSONE (DELTASONE) 10 MG tablet TAKE ONE TABLET BY MOUTH ONCE DAILY. 04/04/15  Yes Merlyn Albert, MD  sulfamethoxazole-trimethoprim (BACTRIM DS,SEPTRA DS) 800-160 MG tablet Take 1 tablet by mouth 2 (two) times daily. 08/30/15  Yes Babs Sciara, MD  tamsulosin (FLOMAX) 0.4 MG CAPS capsule Take 0.4 mg by mouth daily.   Yes Historical Provider, MD  Vitamin D, Ergocalciferol, (DRISDOL) 50000 UNITS CAPS capsule Take 50,000 Units by mouth every 7 (seven) days. On Monday.   Yes Historical Provider, MD  tiZANidine (ZANAFLEX) 4 MG tablet TAKE 1 TABLET BY MOUTH THREE TIMES DAILY AS NEEDED FOR MUSCLE SPASMS. Patient not taking: Reported on 09/02/2015 05/31/15   Merlyn Albert, MD   BP 115/69 mmHg  Pulse 80  Temp(Src) 97.9 F (36.6 C) (Oral)  Resp 13  Ht 5\' 8"  (1.727 m)  Wt 133 lb (60.328 kg)  BMI 20.23 kg/m2  SpO2 95% Physical Exam Physical Exam  Nursing note and vitals reviewed. Constitutional: Chronically ill appearing, pale, thin, non-toxic, and in no acute distress Head: Normocephalic and atraumatic.  Mouth/Throat: Oropharynx is clear and moist.  Neck: Normal range of motion. Neck supple. No cervical spine tendernesss Cardiovascular: Normal rate and regular rhythm.  No edema. Pulmonary/Chest: Effort normal and breath sounds normal. No chest wall tenderness Abdominal: Soft. There is no tenderness. There is  no rebound and no guarding.  Rectal exam with brown stool and no blood or melena. Stage II pressure ulcers noted to sacrum without active drainage Musculoskeletal: Normal range of motion.  Neurological: Alert, no facial droop, fluent speech, moves all extremities  symmetrically Skin: Skin is warm and dry.  Psychiatric: Cooperative  ED Course  Procedures (including critical care time) Labs Review Labs Reviewed  CBC WITH DIFFERENTIAL/PLATELET - Abnormal; Notable for the following:    WBC 15.5 (*)    RBC 2.61 (*)    Hemoglobin 7.6 (*)    HCT 24.3 (*)    RDW 18.9 (*)    Platelets 131 (*)    Neutro Abs 13.4 (*)    All other components within normal limits  COMPREHENSIVE METABOLIC PANEL - Abnormal; Notable for the following:    CO2 18 (*)    Glucose, Bld 113 (*)    BUN 45 (*)    Creatinine, Ser 3.50 (*)    Calcium 6.5 (*)    Total Protein 5.9 (*)    Albumin 2.5 (*)    AST 100 (*)    GFR calc non Af Amer 16 (*)    GFR calc Af Amer 19 (*)    All other components within normal limits  URINALYSIS, ROUTINE W REFLEX MICROSCOPIC (NOT AT [facility]) - Abnormal; Notable for the following:    Hgb urine dipstick LARGE (*)    Protein, ur 30 (*)    All other components within normal limits  URINE MICROSCOPIC-ADD ON - Abnormal; Notable for the following:    Squamous Epithelial / LPF 0-5 (*)    Bacteria, UA MANY (*)    All other components within normal limits  I-STAT CG4 LACTIC ACID, ED - Abnormal; Notable for the following:    Lactic Acid, Venous 2.26 (*)    All other components within normal limits  I-STAT CHEM 8, ED - Abnormal; Notable for the following:    Chloride 115 (*)    BUN 44 (*)    Creatinine, Ser 3.30 (*)    Calcium, Ion 0.54 (*)    Hemoglobin 7.8 (*)    HCT 23.0 (*)    All other components within normal limits  PROTIME-INR  APTT  I-STAT CG4 LACTIC ACID, ED  TYPE AND SCREEN  PREPARE RBC (CROSSMATCH)  ABO/RH    Imaging Review Ct Abdomen Pelvis Wo Contrast  09/02/2015   CLINICAL DATA:  History of recent falls while on blood thinners EXAM: CT ABDOMEN AND PELVIS WITHOUT CONTRAST TECHNIQUE: Multidetector CT imaging of the abdomen and pelvis was performed following the standard protocol without IV contrast. COMPARISON:  None. FINDINGS: Lung bases demonstrate some chronic fibrotic changes. No focal infiltrate or sizable effusion is seen. The liver, spleen, adrenal glands and pancreas are all normal in their CT appearance. The gallbladder is well distended and demonstrates a single dependent gallstone without complicating factors. Kidneys demonstrate bilateral nonobstructing renal stones. The ureters are within normal limits bilaterally. A few scattered hypodensities are noted within the right kidney consistent with cystic change. Aortic iliac calcifications are noted without aneurysmal dilatation. The appendix is within normal limits. Mild diverticular change is noted without diverticulitis. The bladder is well distended. Postsurgical changes are noted in the lower lumbar spine. No acute bony abnormality is seen. No soft tissue hematoma is seen. IMPRESSION: Cholelithiasis without complicating factors. No acute abnormality seen. Electronically Signed   By: Alcide Clever M.D.   On: 09/02/2015 17:33   Dg Chest 2 View  09/02/2015  CLINICAL DATA:  Fall, left shoulder and elbow pain, weakness EXAM: CHEST  2 VIEW COMPARISON:  07/2015 FINDINGS: Low lung volumes with similar chronic bibasilar interstitial opacities which correlate with areas of chronic bronchiectasis by comparison chest CT. Heart is enlarged.  Prior coronary bypass changes noted. No effusion, superimposed edema or pneumothorax. Trachea midline. IMPRESSION: Cardiomegaly without CHF or pneumonia Chronic interstitial changes and bronchiectasis particularly in the lower lobes. Electronically Signed   By: Judie Petit.  Shick M.D.   On: 09/02/2015 15:05   Ct Head Wo Contrast  09/02/2015  CLINICAL DATA:  Pain following fall 2 days prior  EXAM: CT HEAD WITHOUT CONTRAST CT CERVICAL SPINE WITHOUT CONTRAST TECHNIQUE: Multidetector CT imaging of the head and cervical spine was performed following the standard protocol without intravenous contrast. Multiplanar CT image reconstructions of the cervical spine were also generated. COMPARISON:  CT head and CT cervical spine June 24, 2015; brain MRI June 26, 2015 FINDINGS: CT HEAD FINDINGS Mild diffuse atrophy is stable. There is no intracranial mass, hemorrhage, extra-axial fluid collection, or midline shift. There is a focal infarct at the left frontal -parietal lobe which is undergone evolution compared to prior studies. There is evidence of a prior infarct in the mid right occipital lobe region, stable. There is evidence of a prior lacunar type infarct in the superior head of the caudate nucleus on the right. Decreased attenuation in the periphery of the posterior right cerebellum is felt to be due to prior infarct. This finding is somewhat better seen compared to prior CT but was present on MR examination 2 months prior and is unchanged. There is slight small vessel disease in the centra semiovale bilaterally. There is no new gray-white compartment lesion. No acute infarct evident. The bony calvarium appears intact. There is opacification of multiple mastoid air cells on the left, a finding noted previously. Right-sided mastoid air cells are clear. There is an air-fluid level in the left maxillary antrum. There is opacification in several ethmoid air cells bilaterally. No intraorbital lesions are identified. CT CERVICAL SPINE FINDINGS There is no demonstrable fracture. Slight retrolisthesis of C3 on C4 is stable. There is slight retrolisthesis of C6 on C7, stable. These changes are consistent with underlying spondylosis. There is no new spondylolisthesis. Prevertebral soft tissues and predental space regions are normal. There is marked disc space narrowing at C3-4, C5-6, C6-7, and C7-T1, stable. There  is facet hypertrophy at most levels bilaterally. There is exit foraminal narrowing at multiple levels bilaterally, stable. There is broad-based disc protrusion at C3-4, exacerbated by the spondylolisthesis. This finding again leads to mild central canal stenosis at C3-4. There is marked exit foraminal narrowing on the right at C6-7 and C7-T1, stable. There is also fairly significant exit foraminal narrowing at C6-7 on the left. There is calcification in each carotid artery. There is apical lung scarring bilaterally. IMPRESSION: CT head: Atrophy with prior infarcts, either stable or having undergone evolution compared to prior studies. No acute infarct evident. No mass, hemorrhage, or extra-axial fluid collection. Chronic left-sided mastoid disease. There is evidence of acute sinusitis with air-fluid level in left maxillary antrum. Opacification is also noted in several ethmoid air cells bilaterally. CT cervical spine: No demonstrable fracture. Areas of mild spondylolisthesis are felt to be due to underlying spondylosis is stable. 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 There is a degree of spinal stenosis at C3-4 due to disc protrusion exacerbated by spondylolisthesis. There is calcification in both carotid arteries. Electronically Signed   By: Bretta Bang III M.D.   On: 09/02/2015 15:44   Ct Cervical Spine Wo Contrast  09/02/2015  CLINICAL DATA:  Pain following fall 2 days prior EXAM: CT HEAD WITHOUT CONTRAST CT CERVICAL SPINE WITHOUT CONTRAST TECHNIQUE: Multidetector CT imaging  of the head and cervical spine was performed following the standard protocol without intravenous contrast. Multiplanar CT image reconstructions of the cervical spine were also generated. COMPARISON:  CT head and CT cervical spine June 24, 2015; brain MRI June 26, 2015 FINDINGS: CT HEAD FINDINGS Mild diffuse atrophy is stable. There is no intracranial mass, hemorrhage, extra-axial fluid collection, or  midline shift. There is a focal infarct at the left frontal -parietal lobe which is undergone evolution compared to prior studies. There is evidence of a prior infarct in the mid right occipital lobe region, stable. There is evidence of a prior lacunar type infarct in the superior head of the caudate nucleus on the right. Decreased attenuation in the periphery of the posterior right cerebellum is felt to be due to prior infarct. This finding is somewhat better seen compared to prior CT but was present on MR examination 2 months prior and is unchanged. There is slight small vessel disease in the centra semiovale bilaterally. There is no new gray-white compartment lesion. No acute infarct evident. The bony calvarium appears intact. There is opacification of multiple mastoid air cells on the left, a finding noted previously. Right-sided mastoid air cells are clear. There is an air-fluid level in the left maxillary antrum. There is opacification in several ethmoid air cells bilaterally. No intraorbital lesions are identified. CT CERVICAL SPINE FINDINGS There is no demonstrable fracture. Slight retrolisthesis of C3 on C4 is stable. There is slight retrolisthesis of C6 on C7, stable. These changes are consistent with underlying spondylosis. There is no new spondylolisthesis. Prevertebral soft tissues and predental space regions are normal. There is marked disc space narrowing at C3-4, C5-6, C6-7, and C7-T1, stable. There is facet hypertrophy at most levels bilaterally. There is exit foraminal narrowing at multiple levels bilaterally, stable. There is broad-based disc protrusion at C3-4, exacerbated by the spondylolisthesis. This finding again leads to mild central canal stenosis at C3-4. There is marked exit foraminal narrowing on the right at C6-7 and C7-T1, stable. There is also fairly significant exit foraminal narrowing at C6-7 on the left. There is calcification in each carotid artery. There is apical lung scarring  bilaterally. IMPRESSION: CT head: Atrophy with prior infarcts, either stable or having undergone evolution compared to prior studies. No acute infarct evident. No mass, hemorrhage, or extra-axial fluid collection. Chronic left-sided mastoid disease. There is evidence of acute sinusitis with air-fluid level in left maxillary antrum. Opacification is also noted in several ethmoid air cells bilaterally. CT cervical spine: No demonstrable fracture. Areas of mild spondylolisthesis are felt to be due to underlying spondylosis is stable. 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 There is a degree of spinal stenosis at C3-4 due to disc protrusion exacerbated by spondylolisthesis. There is calcification in both carotid arteries. Electronically Signed   By: Bretta Bang III M.D.   On: 09/02/2015 15:44   I have personally reviewed and evaluated these images and lab results as part of my medical decision-making.   EKG Interpretation   Date/Time:  Saturday September 02 2015 12:55:56 EST Ventricular Rate:  69 PR Interval:  161 QRS Duration: 77 QT Interval:  421 QTC Calculation: 451 R Axis:   -15 Text Interpretation:  Sinus rhythm Atrial premature complex Inferior  infarct, age indeterminate Anteroseptal infarct, age indeterminate No  significant change since last tracing Confirmed by Beckhem Isadore MD, Mirely Pangle [id number]) on  09/02/2015 2:45:39 PM      MDM   Final diagnoses:  Fall, initial encounter  Acute renal  failure, unspecified acute renal failure type (HCC)  Hypotension, unspecified hypotension type  Anemia, unspecified anemia type  Pressure ulcer of sacrum, stage II    In short, this is a 70 year old male with history of prior CVA, CAD, atrial fibrillation, hypertension and hyperlipidemia on Plavix who presents after frequent falls and generalized weakness. He on presentation is chronically ill-appearing, pale, thin, but in no acute distress. He initially noted to be hypotensive with  systolic blood pressures in the 80s to 90s. Afebrile, and without respiratory symptoms. Unclear initially if this is secondary to bleeding from trauma versus infection versus severe dehydration given his significantly decreased by mouth intake. Initial blood work is concerning for acute renal failure with a creatinine of 3.5. He also has new anemia of 7.6, decreased from his baseline of around 10. No evidence of traumatic injuries on exam, and no evidence of GI bleeding on rectal exam. His stool is brown and guaiac negative at bedside. CT head and cervical spine shows no acute cervical spine or head injury. A CT abdomen pelvis was also performed to evaluate for traumatic bleeding in the setting of his hemoglobin drop. Unable to use IV contrast, but CT without contrast shows no hematoma or clear evidence of injury. Unclear at this time what his source of his new anemia is, but he also has some mild thrombocytopenia compared to prior as well. He is transfused 1 unit of blood in the setting of his hypotension, although his blood pressure had initially improved 100s systolic after 1 L of IV fluids. No evidence of infection is his urinalysis and chest x-ray is unremarkable. No incidental findings of infection noted within his abdomen on CT either. Is discussed with Dr. Nedra Hai from [facility] hospitalist who will admit for ongoing management.    Lavera Guise, MD 09/02/15 [phone]

## 2015-09-02 NOTE — ED Notes (Signed)
Pt had fall 2 days ago, no LOC. Pt brought in by [facility] c/o left shoulder and left elbow.

## 2015-09-02 NOTE — ED Notes (Signed)
CRITICAL VALUE ALERT  Critical value received:  Calcium Ionized 0.54, Lactic Acid 2.26  Date of notification:  09/02/15  Time of notification:  1745  Critical value read back:Yes.    Nurse who received alert:  Fransico Him, RN   MD notified (1st page):  Dr. Verdie Mosher  Time of verbal notification:  1745  MD notified (2nd page):  Time of second page:  Responding MD:  Dr. Verdie Mosher  Time MD responded:  [phone]

## 2015-09-02 NOTE — H&P (Signed)
[facility] History and Physical  [name] ZOX:[id number] DOB: 10/22/1945    PCP:   Lubertha South, MD   Chief Complaint: weakness and falling, found to have worsening anemia and AKI.   HPI: [name] is an 69 y.o. male with multiple medical problems whom I admitted in Oct 2016 for acute CVA, along with having hx of HLD, pulmonary fibrosis thought to be due to methyl trexate for his RA, afib, HTN, prior tobacco abuse, RA on chronic steroid with secondary adrenal insufficiency, chronic back pain, brought to the ER as he has been falling and feeling weak.  He did not hit his head or loss consciousness.  During his prior CVA, he was placed on ASA, Plavix per neurology recommnendation, along with Prednisone, and PPI.  He was found to have moderate anemia, with Hb of 7.6 g/dL, with baseline around 9, along with AKI, with Cr of 3.5, with baseline Cr of 1.3-1.5. He has been on diuretics, along with an ACE I.  He had head and neck CT showing no acute process, and abdominal CT showing no non obstructing kidney stone, no hydronephrosis, and no bleed. EKG showed no acute ST T changes.  No troponins were done. He denied black or bloody stool, and he has no abdominal pain.  He did not take any NSAIDS, and he was reported to have OB negative stool by EDP.   Rewiew of Systems:  Constitutional: Negative for malaise, fever and chills. No significant weight loss or weight gain Eyes: Negative for eye pain, redness and discharge, diplopia, visual changes, or flashes of light. ENMT: Negative for ear pain, hoarseness, nasal congestion, sinus pressure and sore throat. No headaches; tinnitus, drooling, or problem swallowing. Cardiovascular: Negative for chest pain, palpitations, diaphoresis, dyspnea and peripheral edema. ; No orthopnea, PND Respiratory: Negative for cough, hemoptysis, wheezing and stridor. No pleuritic chestpain. Gastrointestinal: Negative for nausea, vomiting, diarrhea, constipation, abdominal  pain, melena, blood in stool, hematemesis, jaundice and rectal bleeding.    Genitourinary: Negative for frequency, dysuria, incontinence,flank pain and hematuria; Musculoskeletal: Negative for back pain and neck pain. Negative for swelling and trauma.;  Skin: . Negative for pruritus, rash, abrasions, bruising and skin lesion.; ulcerations Neuro: Negative for headache, and neck stiffness. Negative for weakness, altered level of consciousness , altered mental status, extremity weakness, burning feet, involuntary movement, seizure and syncope.  Psych: negative for anxiety, depression, insomnia, tearfulness, panic attacks, hallucinations, paranoia, suicidal or homicidal ideation   Past Medical History  Diagnosis Date  . 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 Hyperlipidemia   . Hypertension   . PVD (peripheral vascular disease) (HCC)     Left iliac PCI/stent  . Cerebrovascular disease   . Tobacco abuse, in remission     Remote  . GERD (gastroesophageal reflux disease)   . Hypothyroid   . Allergic rhinitis   . Chronic lung disease     ?  Rheumatoid lung; ?  Adverse reaction to methotrexate  . Schatzki's ring   . Hx of Clostridium difficile infection     Recurrent; associated 40 pound weight loss  . Achalasia   . Diabetes mellitus     borderline no meds  . Atrial fibrillation (HCC)   . Chronic back pain   . 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 Cervical spondylosis   . DDD (degenerative disc disease), lumbar   . Pneumonia   . Carotid artery occlusion   . Nephrolithiasis 2010  s/p lithotripsy  . Hiatal hernia     Past Surgical History  Procedure Laterality Date  . Coronary artery bypass graft      x6 In 1998  . Total knee arthroplasty      Right  . Ankle fusion      Left  . Wrist fusion      Right  . Shoulder surgery    . Colonoscopy  09/12/2011    Dr. Elly Modena hemorrhoids, normal colon and distal terminal ileum. Random bx  negative. Stool for CDiff positive.  . Esophagogastroduodenoscopy  09/13/2011    Dr. Lyndle Herrlich plawues mid-esophagus KOH negative. Distal esophageal ring and ulcer. Mild gastritis. duodenal diverticulum. Savaory dilation 26mm.   Gaspar Bidding dilation  09/13/2011  . Esophageal manometry  09/30/2011    Procedure: ESOPHAGEAL MANOMETRY (EM);  Surgeon: Rob Bunting, MD;  Location: [facility];  Service: Endoscopy;  Laterality: N/A;  . Esophagogastroduodenoscopy  09/12/2011    Dr. Rinaldo Ratel rings s/p dilation  . Cardiac surgery    . Esophagomyotomy  11/12/11    [facility]- with DOR antireflux surgery  . Esophagogastroduodenoscopy  06/25/2012    Procedure: ESOPHAGOGASTRODUODENOSCOPY (EGD);  Surgeon: Corbin Ade, MD;  Location: [facility];  Service: Endoscopy;  Laterality: N/A;  7:30  . Savory dilation  06/25/2012    Procedure: SAVORY DILATION;  Surgeon: Corbin Ade, MD;  Location: [facility];  Service: Endoscopy;  Laterality: N/A;  Elease Hashimoto dilation  06/25/2012    Procedure: Elease Hashimoto DILATION;  Surgeon: Corbin Ade, MD;  Location: [facility];  Service: Endoscopy;  Laterality: N/A;  . Ankle fusion  09/01/2012    Procedure: ANKLE FUSION;  Surgeon: Valeria Batman, MD;  Location: [facility] OR;  Service: Orthopedics;  Laterality: Left;  Take down of angulated tibial/fibula Fractures  . Femoral-tibial bypass graft Left 01/28/2013    Procedure: BYPASS GRAFT FEMORAL-TIBIAL ARTERY;  Surgeon: Nada Libman, MD;  Location: [facility] OR;  Service: Vascular;  Laterality: Left;  Ultrasound Guided  . Amputation Left 01/28/2013    Procedure: AMPUTATION LEFT 5TH TOE;  Surgeon: Nada Libman, MD;  Location: [facility] OR;  Service: Vascular;  Laterality: Left;  . Spine surgery  11-03-13  . Abdominal aortagram N/A 01/01/2013    Procedure: ABDOMINAL Ronny Flurry;  Surgeon: Sherren Kerns, MD;  Location: [facility] CATH LAB;  Service: Cardiovascular;  Laterality: N/A;  . Abdominal aortagram N/A 10/05/2013    Procedure: ABDOMINAL  AORTAGRAM;  Surgeon: Nada Libman, MD;  Location: [facility] CATH LAB;  Service: Cardiovascular;  Laterality: N/A;  . Lower extremity angiogram Left 10/05/2013    Procedure: LOWER EXTREMITY ANGIOGRAM;  Surgeon: Nada Libman, MD;  Location: [facility] CATH LAB;  Service: Cardiovascular;  Laterality: Left;  . Abdominal aortagram N/A 11/01/2014    Procedure: ABDOMINAL Ronny Flurry;  Surgeon: Nada Libman, MD;  Location: [facility] CATH LAB;  Service: Cardiovascular;  Laterality: N/A;  . Joint replacement Right 1999    Knee  . Toe amputation Left 09/05/2014    Removed left fifth toe  . Cataract extraction w/phaco Left 12/22/2014    Procedure: CATARACT EXTRACTION PHACO AND INTRAOCULAR LENS PLACEMENT (IOC);  Surgeon: Gemma Payor, MD;  Location: [facility] ORS;  Service: Ophthalmology;  Laterality: Left;  CDE:11.89  . Cataract extraction w/phaco Right 01/30/2015    Procedure: CATARACT EXTRACTION PHACO AND INTRAOCULAR LENS PLACEMENT RIGHT EYE;  Surgeon: Gemma Payor, MD;  Location: [facility] ORS;  Service: Ophthalmology;  Laterality: Right;  CDE:10.56  . Eye surgery Left December 22, 2014    Cataract  . Eye surgery Right December 31, 2014    Cataract    Medications:  HOME MEDS: Prior to Admission medications   Medication Sig Start Date End Date Taking? Authorizing Provider  ALPRAZolam (XANAX) 1 MG tablet TAKE 1 TABLET BY MOUTH ONCE DAILY AS NEEDED FOR ANXIETY. 05/11/15  Yes Merlyn Albert, MD  aspirin EC 325 MG tablet Take 1 tablet (325 mg total) by mouth daily. 06/26/15  Yes Houston Siren, MD  atenolol (TENORMIN) 25 MG tablet TAKE (1) TABLET BY MOUTH TWICE DAILY. 04/12/15  Yes Merlyn Albert, MD  atorvastatin (LIPITOR) 80 MG tablet TAKE ONE TABLET BY MOUTH ONCE DAILY. 08/14/15  Yes Antoine Poche, MD  clopidogrel (PLAVIX) 75 MG tablet Take 1 tablet (75 mg total) by mouth daily. 06/26/15  Yes Houston Siren, MD  finasteride (PROSCAR) 5 MG tablet Take 5 mg by mouth daily.   Yes Historical Provider, MD  folic acid (FOLVITE) 1 MG tablet Take 1 mg by  mouth daily.   Yes Historical Provider, MD  furosemide (LASIX) 20 MG tablet 1/2 to 1 qam for swelling Patient taking differently: as needed. 1/2 to 1 qam for swelling 04/11/15  Yes Babs Sciara, MD  HYDROcodone-acetaminophen [facility]) 10-325 MG tablet Take 1 tablet up to five times daily 08/07/15  Yes Merlyn Albert, MD  leflunomide (ARAVA) 20 MG tablet TAKE 2 TABLETS BY MOUTH DAILY 05/12/15  Yes Merlyn Albert, MD  levothyroxine (SYNTHROID, LEVOTHROID) 88 MCG tablet TAKE ONE TABLET BY MOUTH ONCE DAILY. 06/09/15  Yes Merlyn Albert, MD  lidocaine (LIDODERM) 5 % Apply one patch up to 12 hours to affected area. 07/26/15  Yes Merlyn Albert, MD  lisinopril (PRINIVIL,ZESTRIL) 20 MG tablet Take 20 mg by mouth daily.  07/02/15  Yes Historical Provider, MD  losartan (COZAAR) 25 MG tablet Take 1 tablet (25 mg total) by mouth daily. 07/05/15  Yes Antoine Poche, MD  Melatonin 3 MG TABS Take 3-6 mg by mouth at bedtime as needed.    Yes Historical Provider, MD  Multiple Vitamin (MULTIVITAMIN) capsule Take 1 capsule by mouth daily.   Yes Historical Provider, MD  omeprazole (PRILOSEC OTC) 20 MG tablet Take 20 mg by mouth 2 (two) times daily.   Yes Historical Provider, MD  predniSONE (DELTASONE) 10 MG tablet TAKE ONE TABLET BY MOUTH ONCE DAILY. 04/04/15  Yes Merlyn Albert, MD  sulfamethoxazole-trimethoprim (BACTRIM DS,SEPTRA DS) 800-160 MG tablet Take 1 tablet by mouth 2 (two) times daily. 08/30/15  Yes Babs Sciara, MD  tamsulosin (FLOMAX) 0.4 MG CAPS capsule Take 0.4 mg by mouth daily.   Yes Historical Provider, MD  Vitamin D, Ergocalciferol, (DRISDOL) 50000 UNITS CAPS capsule Take 50,000 Units by mouth every 7 (seven) days. On Monday.   Yes Historical Provider, MD  tiZANidine (ZANAFLEX) 4 MG tablet TAKE 1 TABLET BY MOUTH THREE TIMES DAILY AS NEEDED FOR MUSCLE SPASMS. Patient not taking: Reported on 09/02/2015 05/31/15   Merlyn Albert, MD     Allergies:  Allergies  Allergen Reactions  .  Clindamycin/Lincomycin Other (See Comments)    Kidney Failure  . Amoxicillin Hives and Other (See Comments)    Hallucinations   . Ciprofloxacin Other (See Comments)    C-diff  . Other Hives, Diarrhea and Other (See Comments)    "Mycin" Causes C-diff  . Ceftin [Cefuroxime Axetil]     Taste like rusty iron, nausea,  . Doxycycline Nausea And Vomiting  .  Levofloxacin Nausea And Vomiting    Social History:   reports that he quit smoking about 49 years ago. His smoking use included Cigarettes. He started smoking about 58 years ago. He has a 2.5 pack-year smoking history. He has quit using smokeless tobacco. He reports that he does not drink alcohol or use illicit drugs.  Family History: Family History  Problem Relation Age of Onset  . Stomach cancer Mother 19  . Cancer Mother     Stomach  . Heart disease Mother   . Heart attack Mother   . Heart attack Father   . Heart disease Father   . Heart disease Brother   . Leukemia Brother   . Lung disease Son     infant  . Lung disease Daughter     infant     Physical Exam: Filed Vitals:   09/02/15 1824 09/02/15 1830 09/02/15 1841 09/02/15 1900  BP: 125/87 109/71 118/99 115/69  Pulse: 90 84 81 80  Temp: 97.9 F (36.6 C)  97.9 F (36.6 C)   TempSrc: Oral  Oral   Resp: Height:      Weight:      SpO2: 99% 97% 97% 95%   Blood pressure 115/69, pulse 80, temperature 97.9 F (36.6 C), temperature source Oral, resp. rate 13, height  (1.727 m), weight 60.328 kg (133 lb), SpO2 95 %.  GEN:  Pleasant  patient lying in the stretcher in no acute distress; cooperative with exam. PSYCH:  alert and oriented x4; does not appear anxious or depressed; affect is appropriate. HEENT: Mucous membranes pink and anicteric; PERRLA; EOM intact; no cervical lymphadenopathy nor thyromegaly or carotid bruit; no JVD; There were no stridor. Neck is very supple. Breasts:: Not examined CHEST WALL: No tenderness CHEST: Normal respiration,  clear to auscultation bilaterally.  HEART: Regular rate and rhythm.  There are no murmur, rub, or gallops.   BACK: No kyphosis or scoliosis; no CVA tenderness ABDOMEN: soft and non-tender; no masses, no organomegaly, normal abdominal bowel sounds; no pannus; no intertriginous candida. There is no rebound and no distention. Rectal Exam: Not done EXTREMITIES: No bone or joint deformity; age-appropriate arthropathy of the hands and knees; no edema; no ulcerations.  There is no calf tenderness. Genitalia: not examined PULSES: 2+ and symmetric SKIN: Normal hydration no rash or ulceration CNS: Cranial nerves 2-12 grossly intact no focal lateralizing neurologic deficit.  Speech is fluent; uvula elevated with phonation, facial symmetry and tongue midline. DTR are normal bilaterally, cerebella exam is intact, barbinski is negative and strengths are equaled bilaterally.  No sensory loss.   Labs on Admission:  Basic Metabolic Panel:  Recent Labs Lab 09/02/15 1446 09/02/15 1729  NA 140 139  K 4.7 4.5  CL 110 115*  CO2 18*  --   GLUCOSE 113* 92  BUN 45* 44*  CREATININE 3.50* 3.30*  CALCIUM 6.5*  --    Liver Function Tests:  Recent Labs Lab 09/02/15 1446  AST 100*  ALT 34  ALKPHOS 116  BILITOT 0.7  PROT 5.9*  ALBUMIN 2.5*   CBC:  Recent Labs Lab 09/02/15 1446 09/02/15 1729  WBC 15.5*  --   NEUTROABS 13.4*  --   HGB 7.6* 7.8*  HCT 24.3* 23.0*  MCV 93.1  --   PLT 131*  --     Radiological Exams on Admission: Ct Abdomen Pelvis Wo Contrast  09/02/2015  CLINICAL DATA:  History of recent falls while on blood thinners EXAM:  CT ABDOMEN AND PELVIS WITHOUT CONTRAST TECHNIQUE: Multidetector CT imaging of the abdomen and pelvis was performed following the standard protocol without IV contrast. COMPARISON:  None. FINDINGS: Lung bases demonstrate some chronic fibrotic changes. No focal infiltrate or sizable effusion is seen. The liver, spleen, adrenal glands and pancreas are all normal in  their CT appearance. The gallbladder is well distended and demonstrates a single dependent gallstone without complicating factors. Kidneys demonstrate bilateral nonobstructing renal stones. The ureters are within normal limits bilaterally. A few scattered hypodensities are noted within the right kidney consistent with cystic change. Aortic iliac calcifications are noted without aneurysmal dilatation. The appendix is within normal limits. Mild diverticular change is noted without diverticulitis. The bladder is well distended. Postsurgical changes are noted in the lower lumbar spine. No acute bony abnormality is seen. No soft tissue hematoma is seen. IMPRESSION: Cholelithiasis without complicating factors. No acute abnormality seen. Electronically Signed   By: Alcide Clever M.D.   On: 09/02/2015 17:33   Dg Chest 2 View  09/02/2015  CLINICAL DATA:  Fall, left shoulder and elbow pain, weakness EXAM: CHEST  2 VIEW COMPARISON:  07/2015 FINDINGS: Low lung volumes with similar chronic bibasilar interstitial opacities which correlate with areas of chronic bronchiectasis by comparison chest CT. Heart is enlarged. Prior coronary bypass changes noted. No effusion, superimposed edema or pneumothorax. Trachea midline. IMPRESSION: Cardiomegaly without CHF or pneumonia Chronic interstitial changes and bronchiectasis particularly in the lower lobes. Electronically Signed   By: Judie Petit.  Shick M.D.   On: 09/02/2015 15:05   Ct Head Wo Contrast  09/02/2015  CLINICAL DATA:  Pain following fall 2 days prior EXAM: CT HEAD WITHOUT CONTRAST CT CERVICAL SPINE WITHOUT CONTRAST TECHNIQUE: Multidetector CT imaging of the head and cervical spine was performed following the standard protocol without intravenous contrast. Multiplanar CT image reconstructions of the cervical spine were also generated. COMPARISON:  CT head and CT cervical spine June 24, 2015; brain MRI June 26, 2015 FINDINGS: CT HEAD FINDINGS Mild diffuse atrophy is stable.  There is no intracranial mass, hemorrhage, extra-axial fluid collection, or midline shift. There is a focal infarct at the left frontal -parietal lobe which is undergone evolution compared to prior studies. There is evidence of a prior infarct in the mid right occipital lobe region, stable. There is evidence of a prior lacunar type infarct in the superior head of the caudate nucleus on the right. Decreased attenuation in the periphery of the posterior right cerebellum is felt to be due to prior infarct. This finding is somewhat better seen compared to prior CT but was present on MR examination 2 months prior and is unchanged. There is slight small vessel disease in the centra semiovale bilaterally. There is no new gray-white compartment lesion. No acute infarct evident. The bony calvarium appears intact. There is opacification of multiple mastoid air cells on the left, a finding noted previously. Right-sided mastoid air cells are clear. There is an air-fluid level in the left maxillary antrum. There is opacification in several ethmoid air cells bilaterally. No intraorbital lesions are identified. CT CERVICAL SPINE FINDINGS There is no demonstrable fracture. Slight retrolisthesis of C3 on C4 is stable. There is slight retrolisthesis of C6 on C7, stable. These changes are consistent with underlying spondylosis. There is no new spondylolisthesis. Prevertebral soft tissues and predental space regions are normal. There is marked disc space narrowing at C3-4, C5-6, C6-7, and C7-T1, stable. There is facet hypertrophy at most levels bilaterally. There is exit foraminal narrowing at multiple  levels bilaterally, stable. There is broad-based disc protrusion at C3-4, exacerbated by the spondylolisthesis. This finding again leads to mild central canal stenosis at C3-4. There is marked exit foraminal narrowing on the right at C6-7 and C7-T1, stable. There is also fairly significant exit foraminal narrowing at C6-7 on the left.  There is calcification in each carotid artery. There is apical lung scarring bilaterally. IMPRESSION: CT head: Atrophy with prior infarcts, either stable or having undergone evolution compared to prior studies. No acute infarct evident. No mass, hemorrhage, or extra-axial fluid collection. Chronic left-sided mastoid disease. There is evidence of acute sinusitis with air-fluid level in left maxillary antrum. Opacification is also noted in several ethmoid air cells bilaterally. CT cervical spine: No demonstrable fracture. Areas of mild spondylolisthesis are felt to be due to underlying spondylosis is stable. 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 There is a degree of spinal stenosis at C3-4 due to disc protrusion exacerbated by spondylolisthesis. There is calcification in both carotid arteries. Electronically Signed   By: Bretta Bang III M.D.   On: 09/02/2015 15:44   Ct Cervical Spine Wo Contrast  09/02/2015  CLINICAL DATA:  Pain following fall 2 days prior EXAM: CT HEAD WITHOUT CONTRAST CT CERVICAL SPINE WITHOUT CONTRAST TECHNIQUE: Multidetector CT imaging of the head and cervical spine was performed following the standard protocol without intravenous contrast. Multiplanar CT image reconstructions of the cervical spine were also generated. COMPARISON:  CT head and CT cervical spine June 24, 2015; brain MRI June 26, 2015 FINDINGS: CT HEAD FINDINGS Mild diffuse atrophy is stable. There is no intracranial mass, hemorrhage, extra-axial fluid collection, or midline shift. There is a focal infarct at the left frontal -parietal lobe which is undergone evolution compared to prior studies. There is evidence of a prior infarct in the mid right occipital lobe region, stable. There is evidence of a prior lacunar type infarct in the superior head of the caudate nucleus on the right. Decreased attenuation in the periphery of the posterior right cerebellum is felt to be due to prior infarct. This  finding is somewhat better seen compared to prior CT but was present on MR examination 2 months prior and is unchanged. There is slight small vessel disease in the centra semiovale bilaterally. There is no new gray-white compartment lesion. No acute infarct evident. The bony calvarium appears intact. There is opacification of multiple mastoid air cells on the left, a finding noted previously. Right-sided mastoid air cells are clear. There is an air-fluid level in the left maxillary antrum. There is opacification in several ethmoid air cells bilaterally. No intraorbital lesions are identified. CT CERVICAL SPINE FINDINGS There is no demonstrable fracture. Slight retrolisthesis of C3 on C4 is stable. There is slight retrolisthesis of C6 on C7, stable. These changes are consistent with underlying spondylosis. There is no new spondylolisthesis. Prevertebral soft tissues and predental space regions are normal. There is marked disc space narrowing at C3-4, C5-6, C6-7, and C7-T1, stable. There is facet hypertrophy at most levels bilaterally. There is exit foraminal narrowing at multiple levels bilaterally, stable. There is broad-based disc protrusion at C3-4, exacerbated by the spondylolisthesis. This finding again leads to mild central canal stenosis at C3-4. There is marked exit foraminal narrowing on the right at C6-7 and C7-T1, stable. There is also fairly significant exit foraminal narrowing at C6-7 on the left. There is calcification in each carotid artery. There is apical lung scarring bilaterally. IMPRESSION: CT head: Atrophy with prior infarcts, either stable or  having undergone evolution compared to prior studies. No acute infarct evident. No mass, hemorrhage, or extra-axial fluid collection. Chronic left-sided mastoid disease. There is evidence of acute sinusitis with air-fluid level in left maxillary antrum. Opacification is also noted in several ethmoid air cells bilaterally. CT cervical spine: No demonstrable  fracture. Areas of mild spondylolisthesis are felt to be due to underlying spondylosis is stable. 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 There is a degree of spinal stenosis at C3-4 due to disc protrusion exacerbated by spondylolisthesis. There is calcification in both carotid arteries. Electronically Signed   By: Bretta Bang III M.D.   On: 09/02/2015 15:44    EKG: Independently reviewed.   Assessment/Plan Present on Admission:  . Anemia . Chronic pain syndrome . Rheumatoid arthritis (HCC) . Secondary adrenal insufficiency (HCC) . Stroke (HCC) . AKI (acute kidney injury) (HCC)   PLAN:  Will admit him to telemeter floor, given his stable hemodynamic, and I suspect if he has an UGIB, it s a slow bleed.  He is certainly at risk for upper GI bleed, being on Dual Antiplatelet Therapy along with steroids.  Will transfuse him blood, and consult GI for possible endoscopies.  I will continue his PPI,  Given IV BID.  WIll also check his iron studies.  He was supposed to be on ASA and Plavix, both for CVA and afib, but will hold both at this time.  Will obtain troponins.  As for his AKI, he has no obtructive uropathy, and I think it may be due to diuretics and ACE I combination.  Will follow his Cr, expecting an improvement, especially with blood transfusion.   Due to his secondary adrenal insufficiency, I will give him IV Hydrocortisone, but after the stress dose, he will need either oral or IV Steroids.  For his Afib, his rate is controlled, and he is hemodynamically stable enough for continuation of his betablocker.  I am unsure of future recommendation for his antiplatelet therapy.  He was to be on DAPT for at least 3 months according to Dr Gerilyn Pilgrim for his CVA in Oct, which would bring him to the end of December, but with Prednisone, it doesn't look like he can tolerate them.  He is stable, full code, and will be admitted to [facility] service.  Thank you and have a Good Day.  Other  plans as per orders.  Code Status: FULL Unk Lightning, MD. [facility] Pager [phone] 7pm to 7am.  09/02/2015, 7:54 PM

## 2015-09-02 NOTE — ED Notes (Addendum)
ISTAT comment codes at 1737 were supposed to say, MD notified only, not to repeat test.

## 2015-09-03 ENCOUNTER — Inpatient Hospital Stay (HOSPITAL_COMMUNITY): Payer: Medicare Other

## 2015-09-03 ENCOUNTER — Encounter (HOSPITAL_COMMUNITY): Payer: Self-pay | Admitting: *Deleted

## 2015-09-03 DIAGNOSIS — I214 Non-ST elevation (NSTEMI) myocardial infarction: Secondary | ICD-10-CM

## 2015-09-03 DIAGNOSIS — E2749 Other adrenocortical insufficiency: Secondary | ICD-10-CM

## 2015-09-03 DIAGNOSIS — D649 Anemia, unspecified: Secondary | ICD-10-CM

## 2015-09-03 DIAGNOSIS — I638 Other cerebral infarction: Secondary | ICD-10-CM

## 2015-09-03 LAB — HEMOGLOBIN AND HEMATOCRIT, BLOOD
HCT: 31.9 % — ABNORMAL LOW (ref 39.0–52.0)
HEMATOCRIT: 29 % — AB (ref 39.0–52.0)
HEMOGLOBIN: 9.5 g/dL — AB (ref 13.0–17.0)
Hemoglobin: 10.5 g/dL — ABNORMAL LOW (ref 13.0–17.0)

## 2015-09-03 LAB — CBC
HCT: 29.8 % — ABNORMAL LOW (ref 39.0–52.0)
HEMOGLOBIN: 9.6 g/dL — AB (ref 13.0–17.0)
MCH: 28.4 pg (ref 26.0–34.0)
MCHC: 32.2 g/dL (ref 30.0–36.0)
MCV: 88.2 fL (ref 78.0–100.0)
Platelets: 89 10*3/uL — ABNORMAL LOW (ref 150–400)
RBC: 3.38 MIL/uL — AB (ref 4.22–5.81)
RDW: 19.4 % — ABNORMAL HIGH (ref 11.5–15.5)
WBC: 15.6 10*3/uL — ABNORMAL HIGH (ref 4.0–10.5)

## 2015-09-03 LAB — TROPONIN I
TROPONIN I: 10.29 ng/mL — AB (ref <0.031)
TROPONIN I: 8.76 ng/mL — AB (ref <0.031)

## 2015-09-03 LAB — HEPATIC FUNCTION PANEL
ALT: 36 U/L (ref 17–63)
AST: 95 U/L — ABNORMAL HIGH (ref 15–41)
Albumin: 2.5 g/dL — ABNORMAL LOW (ref 3.5–5.0)
Alkaline Phosphatase: 133 U/L — ABNORMAL HIGH (ref 38–126)
BILIRUBIN INDIRECT: 0.4 mg/dL (ref 0.3–0.9)
Bilirubin, Direct: 0.7 mg/dL — ABNORMAL HIGH (ref 0.1–0.5)
TOTAL PROTEIN: 6 g/dL — AB (ref 6.5–8.1)
Total Bilirubin: 1.1 mg/dL (ref 0.3–1.2)

## 2015-09-03 LAB — FERRITIN: FERRITIN: 680 ng/mL — AB (ref 24–336)

## 2015-09-03 LAB — BASIC METABOLIC PANEL
ANION GAP: 10 (ref 5–15)
BUN: 41 mg/dL — ABNORMAL HIGH (ref 6–20)
CALCIUM: 5.9 mg/dL — AB (ref 8.9–10.3)
CO2: 17 mmol/L — AB (ref 22–32)
Chloride: 113 mmol/L — ABNORMAL HIGH (ref 101–111)
Creatinine, Ser: 2.64 mg/dL — ABNORMAL HIGH (ref 0.61–1.24)
GFR, EST AFRICAN AMERICAN: 27 mL/min — AB (ref >60)
GFR, EST NON AFRICAN AMERICAN: 23 mL/min — AB (ref >60)
Glucose, Bld: 85 mg/dL (ref 65–99)
Potassium: 4.3 mmol/L (ref 3.5–5.1)
Sodium: 140 mmol/L (ref 135–145)

## 2015-09-03 LAB — IRON AND TIBC
IRON: 28 ug/dL — AB (ref 45–182)
SATURATION RATIOS: 16 % — AB (ref 17.9–39.5)
TIBC: 179 ug/dL — AB (ref 250–450)
UIBC: 151 ug/dL

## 2015-09-03 LAB — GLUCOSE, CAPILLARY
GLUCOSE-CAPILLARY: 81 mg/dL (ref 65–99)
Glucose-Capillary: 86 mg/dL (ref 65–99)

## 2015-09-03 LAB — ALBUMIN: Albumin: 2.3 g/dL — ABNORMAL LOW (ref 3.5–5.0)

## 2015-09-03 LAB — OCCULT BLOOD X 1 CARD TO LAB, STOOL: Fecal Occult Bld: NEGATIVE

## 2015-09-03 LAB — MRSA PCR SCREENING: MRSA BY PCR: NEGATIVE

## 2015-09-03 LAB — PREPARE RBC (CROSSMATCH)

## 2015-09-03 MED ORDER — VITAMIN D (ERGOCALCIFEROL) 1.25 MG (50000 UNIT) PO CAPS
50000.0000 [IU] | ORAL_CAPSULE | ORAL | Status: DC
  Administered 2015-09-04: 50000 [IU] via ORAL
  Filled 2015-09-03 (×2): qty 1

## 2015-09-03 MED ORDER — SODIUM CHLORIDE 0.9 % IV SOLN
80.0000 mg | Freq: Once | INTRAVENOUS | Status: AC
  Administered 2015-09-03: 80 mg via INTRAVENOUS
  Filled 2015-09-03: qty 80

## 2015-09-03 MED ORDER — LIDOCAINE 5 % EX PTCH
MEDICATED_PATCH | CUTANEOUS | Status: AC
  Filled 2015-09-03: qty 1

## 2015-09-03 MED ORDER — SODIUM CHLORIDE 0.9 % IV SOLN
INTRAVENOUS | Status: DC
  Administered 2015-09-03 – 2015-09-05 (×3): via INTRAVENOUS

## 2015-09-03 MED ORDER — SODIUM CHLORIDE 0.9 % IV SOLN
8.0000 mg/h | INTRAVENOUS | Status: DC
  Administered 2015-09-03 – 2015-09-04 (×4): 8 mg/h via INTRAVENOUS
  Filled 2015-09-03 (×8): qty 80

## 2015-09-03 MED ORDER — FINASTERIDE 5 MG PO TABS
ORAL_TABLET | ORAL | Status: AC
  Filled 2015-09-03: qty 1

## 2015-09-03 MED ORDER — PANTOPRAZOLE SODIUM 40 MG IV SOLR: 40.0000 mg | Freq: Two times a day (BID) | INTRAVENOUS | Status: DC

## 2015-09-03 MED ORDER — SODIUM CHLORIDE 0.9 % IV SOLN
1.0000 g | Freq: Once | INTRAVENOUS | Status: AC
  Administered 2015-09-03: 1 g via INTRAVENOUS
  Filled 2015-09-03: qty 10

## 2015-09-03 MED ORDER — ONDANSETRON HCL 4 MG/2ML IJ SOLN
4.0000 mg | Freq: Four times a day (QID) | INTRAMUSCULAR | Status: DC | PRN
  Administered 2015-09-03: 4 mg via INTRAVENOUS
  Filled 2015-09-03: qty 2

## 2015-09-03 MED ORDER — SODIUM CHLORIDE 0.9 % IV SOLN
Freq: Once | INTRAVENOUS | Status: AC
  Administered 2015-09-03: 02:00:00 via INTRAVENOUS

## 2015-09-03 NOTE — Progress Notes (Signed)
    Primary cardiologist: Dr. Dina Rich  Received phone call from Dr. Randol Kern at [facility] regarding patient. I reviewed the records, he presented with weakness, recent stroke in October, and other underlying medical conditions including pulmonary fibrosis, CAD, rheumatoid arthritis, and adrenal insufficiency. He was noted to be anemic with hemoglobin of 7.6, also with acute renal insufficiency and a creatinine up to 3.5. GI bleed was suspected based on recent dual antiplatelet treatment, he was given PRBC transfusion with consultation with GI pending. As part of his workup, cardiac markers were also obtained and found to be abnormal consistent with NSTEMI. Troponin I has increased from 9.24-10.29. He has had no chest pain during this time. ECG reviewed and nonspecific. I reviewed the case with Dr. Randol Kern - patient not good candidate for diagnostic cardiac catheterization at this time in light of his comorbidities, particularly acute renal insufficiency. Recommend medical management and stabilization. Would plan to keep him in the ICU at [facility], and our cardiology service will follow him during the week. Dr. Wyline Mood is actually scheduled to work there tomorrow morning. Would hold off on anticoagulation, and agree with holding antiplatelet therapy for now.  Jonelle Sidle, M.D., F.A.C.C.

## 2015-09-03 NOTE — Consult Note (Signed)
Referring Provider: Dr. Nedra Hai  Primary Care Physician:  Lubertha South, MD Primary Gastroenterologist:  Dr. Jena Gauss   Date of Admission: 09/02/15 Date of Consultation: 09/03/15  Reason for Consultation:  Anemia   HPI:  [name] is a 69 y.o. year old male with a history of achalasia, s/p Heller myotomy with Dor fundoplication in Feb 2013, last seen by our office in Sept 2013. Last EGD in Oct 2013 with erosive reflux esophagitis, patent esophagus with Maloney dilation and NSAID gastropathy. History significant for recent stroke Oct 2016, presenting yesterday with weakness and falls. Has been on aspirin and plavix as an outpatient, along with prednisone. Hgb found to be 7.6, baseline around 10-11. CT unremarkable for acute GI findings. Heme negative with brown stool in ED. Hypotensive on admission, acute renal failure noted. Received 2 units PRBCs with improvement in Hgb. Troponin markedly elevated in 9-10 range, NSTEMI. Cardiology on call was consulted via phone per documentation. Cardiology consult to be obtained on Monday, 12/12. No transfer to [facility] as no invasive intervention required at the moment.   Denies any overt GI bleeding. No melena, hematochezia. Notes poor appetite. No dysphagia. Vague abdominal pain located supraumbilically. No NSAIDs, BC, or Goody powders.  Past Medical History  Diagnosis Date  . 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 Hyperlipidemia   . Hypertension   . PVD (peripheral vascular disease) (HCC)     Left iliac PCI/stent  . Cerebrovascular disease   . Tobacco abuse, in remission     Remote  . GERD (gastroesophageal reflux disease)   . Hypothyroid   . Allergic rhinitis   . Chronic lung disease     ?  Rheumatoid lung; ?  Adverse reaction to methotrexate  . Schatzki's ring   . Hx of Clostridium difficile infection     Recurrent; associated 40 pound weight loss  . Achalasia   . Diabetes mellitus     borderline no meds  . Atrial  fibrillation (HCC)   . Chronic back pain   . 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 Cervical spondylosis   . DDD (degenerative disc disease), lumbar   . Pneumonia   . Carotid artery occlusion   . Nephrolithiasis 2010    s/p lithotripsy  . Hiatal hernia     Past Surgical History  Procedure Laterality Date  . Coronary artery bypass graft      x6 In 1998  . Total knee arthroplasty      Right  . Ankle fusion      Left  . Wrist fusion      Right  . Shoulder surgery    . Colonoscopy  09/12/2011    Dr. Elly Modena hemorrhoids, normal colon and distal terminal ileum. Random bx negative. Stool for CDiff positive.  . Esophagogastroduodenoscopy  09/13/2011    Dr. Lyndle Herrlich plawues mid-esophagus KOH negative. Distal esophageal ring and ulcer. Mild gastritis. duodenal diverticulum. Savaory dilation 16mm.   Gaspar Bidding dilation  09/13/2011  . Esophageal manometry  09/30/2011    Procedure: ESOPHAGEAL MANOMETRY (EM);  Surgeon: Rob Bunting, MD;  Location: [facility] ENDOSCOPY;  Service: Endoscopy;  Laterality: N/A;  . Esophagogastroduodenoscopy  09/12/2011    Dr. Rinaldo Ratel rings s/p dilation  . Cardiac surgery    . Esophagomyotomy  11/12/11    [facility]- with DOR antireflux surgery  . Esophagogastroduodenoscopy  06/25/2012    Dr. Jena Gauss: erosive reflux esophagitis, patent esophagus s/p malony dilation, gastric erosions/ulcerations consistent with NSAID gastropathy, neg  H.pylori   . Savory dilation  06/25/2012    Procedure: SAVORY DILATION;  Surgeon: Corbin Ade, MD;  Location: [facility] SUITE;  Service: Endoscopy;  Laterality: N/A;  Elease Hashimoto dilation  06/25/2012    Procedure: Elease Hashimoto DILATION;  Surgeon: Corbin Ade, MD;  Location: [facility];  Service: Endoscopy;  Laterality: N/A;  . Ankle fusion  09/01/2012    Procedure: ANKLE FUSION;  Surgeon: Valeria Batman, MD;  Location: [facility] OR;  Service: Orthopedics;  Laterality: Left;  Take down of angulated tibial/fibula Fractures   . Femoral-tibial bypass graft Left 01/28/2013    Procedure: BYPASS GRAFT FEMORAL-TIBIAL ARTERY;  Surgeon: Nada Libman, MD;  Location: [facility] OR;  Service: Vascular;  Laterality: Left;  Ultrasound Guided  . Amputation Left 01/28/2013    Procedure: AMPUTATION LEFT 5TH TOE;  Surgeon: Nada Libman, MD;  Location: [facility] OR;  Service: Vascular;  Laterality: Left;  . Spine surgery  11-03-13  . Abdominal aortagram N/A 01/01/2013    Procedure: ABDOMINAL Ronny Flurry;  Surgeon: Sherren Kerns, MD;  Location: [facility] CATH LAB;  Service: Cardiovascular;  Laterality: N/A;  . Abdominal aortagram N/A 10/05/2013    Procedure: ABDOMINAL AORTAGRAM;  Surgeon: Nada Libman, MD;  Location: [facility] CATH LAB;  Service: Cardiovascular;  Laterality: N/A;  . Lower extremity angiogram Left 10/05/2013    Procedure: LOWER EXTREMITY ANGIOGRAM;  Surgeon: Nada Libman, MD;  Location: [facility] CATH LAB;  Service: Cardiovascular;  Laterality: Left;  . Abdominal aortagram N/A 11/01/2014    Procedure: ABDOMINAL Ronny Flurry;  Surgeon: Nada Libman, MD;  Location: [facility] CATH LAB;  Service: Cardiovascular;  Laterality: N/A;  . Joint replacement Right 1999    Knee  . Toe amputation Left 09/05/2014    Removed left fifth toe  . Cataract extraction w/phaco Left 12/22/2014    Procedure: CATARACT EXTRACTION PHACO AND INTRAOCULAR LENS PLACEMENT (IOC);  Surgeon: Gemma Payor, MD;  Location: [facility] ORS;  Service: Ophthalmology;  Laterality: Left;  CDE:11.89  . Cataract extraction w/phaco Right 01/30/2015    Procedure: CATARACT EXTRACTION PHACO AND INTRAOCULAR LENS PLACEMENT RIGHT EYE;  Surgeon: Gemma Payor, MD;  Location: [facility] ORS;  Service: Ophthalmology;  Laterality: Right;  CDE:10.56  . Eye surgery Left December 22, 2014    Cataract  . Eye surgery Right December 31, 2014    Cataract    Prior to Admission medications   Medication Sig Start Date End Date Taking? Authorizing Provider  ALPRAZolam (XANAX) 1 MG tablet TAKE 1 TABLET BY MOUTH ONCE DAILY AS NEEDED FOR ANXIETY.  05/11/15  Yes Merlyn Albert, MD  aspirin EC 325 MG tablet Take 1 tablet (325 mg total) by mouth daily. 06/26/15  Yes Houston Siren, MD  atenolol (TENORMIN) 25 MG tablet TAKE (1) TABLET BY MOUTH TWICE DAILY. 04/12/15  Yes Merlyn Albert, MD  atorvastatin (LIPITOR) 80 MG tablet TAKE ONE TABLET BY MOUTH ONCE DAILY. 08/14/15  Yes Antoine Poche, MD  clopidogrel (PLAVIX) 75 MG tablet Take 1 tablet (75 mg total) by mouth daily. 06/26/15  Yes Houston Siren, MD  finasteride (PROSCAR) 5 MG tablet Take 5 mg by mouth daily.   Yes Historical Provider, MD  folic acid (FOLVITE) 1 MG tablet Take 1 mg by mouth daily.   Yes Historical Provider, MD  furosemide (LASIX) 20 MG tablet 1/2 to 1 qam for swelling Patient taking differently: as needed. 1/2 to 1 qam for swelling 04/11/15  Yes Babs Sciara, MD  HYDROcodone-acetaminophen (NORCO) 10-325 MG  tablet Take 1 tablet up to five times daily 08/07/15  Yes Merlyn Albert, MD  leflunomide (ARAVA) 20 MG tablet TAKE 2 TABLETS BY MOUTH DAILY 05/12/15  Yes Merlyn Albert, MD  levothyroxine (SYNTHROID, LEVOTHROID) 88 MCG tablet TAKE ONE TABLET BY MOUTH ONCE DAILY. 06/09/15  Yes Merlyn Albert, MD  lidocaine (LIDODERM) 5 % Apply one patch up to 12 hours to affected area. 07/26/15  Yes Merlyn Albert, MD  lisinopril (PRINIVIL,ZESTRIL) 20 MG tablet Take 20 mg by mouth daily.  07/02/15  Yes Historical Provider, MD  losartan (COZAAR) 25 MG tablet Take 1 tablet (25 mg total) by mouth daily. 07/05/15  Yes Antoine Poche, MD  Melatonin 3 MG TABS Take 3-6 mg by mouth at bedtime as needed.    Yes Historical Provider, MD  Multiple Vitamin (MULTIVITAMIN) capsule Take 1 capsule by mouth daily.   Yes Historical Provider, MD  omeprazole (PRILOSEC OTC) 20 MG tablet Take 20 mg by mouth 2 (two) times daily.   Yes Historical Provider, MD  predniSONE (DELTASONE) 10 MG tablet TAKE ONE TABLET BY MOUTH ONCE DAILY. 04/04/15  Yes Merlyn Albert, MD  sulfamethoxazole-trimethoprim (BACTRIM DS,SEPTRA  DS) 800-160 MG tablet Take 1 tablet by mouth 2 (two) times daily. 08/30/15  Yes Babs Sciara, MD  tamsulosin (FLOMAX) 0.4 MG CAPS capsule Take 0.4 mg by mouth daily.   Yes Historical Provider, MD  Vitamin D, Ergocalciferol, (DRISDOL) 50000 UNITS CAPS capsule Take 50,000 Units by mouth every 7 (seven) days. On Monday.   Yes Historical Provider, MD  tiZANidine (ZANAFLEX) 4 MG tablet TAKE 1 TABLET BY MOUTH THREE TIMES DAILY AS NEEDED FOR MUSCLE SPASMS. Patient not taking: Reported on 09/02/2015 05/31/15   Merlyn Albert, MD    Current Facility-Administered Medications  Medication Dose Route Frequency Provider Last Rate Last Dose  . ALPRAZolam Prudy Feeler) tablet 1 mg  1 mg Oral TID PRN Houston Siren, MD   1 mg at 09/02/15 2139  . atenolol (TENORMIN) tablet 25 mg  25 mg Oral BID Houston Siren, MD   25 mg at 09/03/15 0900  . atorvastatin (LIPITOR) tablet 80 mg  80 mg Oral Daily Houston Siren, MD   80 mg at 09/03/15 0900  . calcium gluconate 1 g in sodium chloride 0.9 % 100 mL IVPB  1 g Intravenous Once Starleen Arms, MD   1 g at 09/03/15 1210  . finasteride (PROSCAR) tablet 5 mg  5 mg Oral Daily Houston Siren, MD   5 mg at 09/03/15 0955  . folic acid (FOLVITE) tablet 1 mg  1 mg Oral Daily Houston Siren, MD   1 mg at 09/03/15 0900  . HYDROcodone-acetaminophen (NORCO) 10-325 MG per tablet 1 tablet  1 tablet Oral Q4H PRN Houston Siren, MD   1 tablet at 09/03/15 0813  . levothyroxine (SYNTHROID, LEVOTHROID) tablet 88 mcg  88 mcg Oral QAC breakfast Houston Siren, MD   88 mcg at 09/03/15 0758  . lidocaine (LIDODERM) 5 % 1 patch  1 patch Transdermal Q24H Houston Siren, MD   1 patch at 09/03/15 0133  . pantoprazole (PROTONIX) 80 mg in sodium chloride 0.9 % 250 mL (0.32 mg/mL) infusion  8 mg/hr Intravenous Continuous Starleen Arms, MD 25 mL/hr at 09/03/15 0813 8 mg/hr at 09/03/15 0813  . [START ON 09/06/2015] pantoprazole (PROTONIX) injection 40 mg  40 mg Intravenous Q12H Dawood S Elgergawy, MD      . sodium chloride 0.9 % injection 3 mL  3 mL  Intravenous Q12H Houston Siren, MD   3 mL at 09/03/15 0900  . tamsulosin (FLOMAX) capsule 0.4 mg  0.4 mg Oral Daily Houston Siren, MD   0.4 mg at 09/03/15 0900  . [START ON 09/04/2015] Vitamin D (Ergocalciferol) (DRISDOL) capsule 50,000 Units  50,000 Units Oral Q Mon Starleen Arms, MD        Allergies as of 09/02/2015 - Review Complete 09/02/2015  Allergen Reaction Noted  . Clindamycin/lincomycin Other (See Comments) 02/23/2014  . Amoxicillin Hives and Other (See Comments) 08/24/2011  . Ciprofloxacin Other (See Comments) 01/21/2013  . Other Hives, Diarrhea, and Other (See Comments) 10/27/2014  . Ceftin [cefuroxime axetil]  08/30/2015  . Doxycycline Nausea And Vomiting 06/05/2011  . Levofloxacin Nausea And Vomiting 03/07/2008    Family History  Problem Relation Age of Onset  . Stomach cancer Mother 58  . Cancer Mother     Stomach  . Heart disease Mother   . Heart attack Mother   . Heart attack Father   . Heart disease Father   . Heart disease Brother   . Leukemia Brother   . Lung disease Son     infant  . Lung disease Daughter     infant  . Colon cancer Neg Hx     Social History   Social History  . Marital Status: Married    Spouse Name: N/A  . Number of Children: 1  . Years of Education: N/A   Occupational History  . retired [employer]    Social History Main Topics  . Smoking status: Former Smoker -- 0.50 packs/day for 5 years    Types: Cigarettes    Start date: 09/23/1956    Quit date: 09/23/1965  . Smokeless tobacco: Former Neurosurgeon  . Alcohol Use: No  . Drug Use: No  . Sexual Activity: Not Currently   Other Topics Concern  . Not on file   Social History Narrative    Review of Systems: Gen: see HPI  CV: Denies chest pain, heart palpitations, syncope, edema  Resp: +SOB GI: see HPI  GU : Denies urinary burning, urinary frequency, urinary incontinence.  MS: +joint pain, right-sided weakness, uses wheelchair since CVA Derm: Denies rash, itching, dry  skin Psych: Denies depression, anxiety,confusion, or memory loss Heme: Denies bruising, bleeding, and enlarged lymph nodes.  Physical Exam: Vital signs in last 24 hours: Temp:  [97.6 F (36.4 C)-98.3 F (36.8 C)] 98.1 F (36.7 C) (12/11 0828) Pulse Rate:  [54-90] 64 (12/11 1000) Resp:  [12-32] 18 (12/11 1000) BP: (84-158)/(53-99) 155/76 mmHg (12/11 1000) SpO2:  [86 %-100 %] 96 % (12/11 1000) Weight:  [130 lb 15.3 oz (59.4 kg)-134 lb 4.2 oz (60.9 kg)] 134 lb 4.2 oz (60.9 kg) (12/11 0515) Last BM Date: 09/02/15 General:   Alert, appears chronically ill, uncomfortable Head:  Normocephalic and atraumatic. Eyes:  Sclera clear, no icterus.   Conjunctiva pink. Ears:  Normal auditory acuity. Nose:  No deformity, discharge,  or lesions. Mouth:  No deformity or lesions, dentition normal. Lungs:  Clear throughout to auscultation.   No wheezes, crackles, or rhonchi. No acute distress. Heart:  S1 S2 present without appreciable murmur  Abdomen:  Soft,TTP upper abdomen without rebound or guarding Rectal:  Deferred  Msk:  Bilateral lower extremity muscle wasting, right-sided weakness Extremities:  Without  edema. Neurologic:  Alert and  oriented x4; Skin:  Intact without significant lesions or rashes. Psych:  Alert, flat affect  Intake/Output from previous day: 12/10 0701 -  12/11 0700 In: 1315 [P.O.:240; I.V.:300; Blood:775] Out: 652 [Urine:651; Stool:1] Intake/Output this shift: Total I/O In: -  Out: 400 [Urine:400]  Lab Results:  Recent Labs  09/02/15 1446 09/02/15 1729 09/02/15 2236 09/03/15 0657  WBC 15.5*  --   --  15.6*  HGB 7.6* 7.8* 7.9* 9.6*  HCT 24.3* 23.0* 24.9* 29.8*  PLT 131*  --   --  89*   BMET  Recent Labs  09/02/15 1446 09/02/15 1729 09/03/15 0657  NA 140 139 140  K 4.7 4.5 4.3  CL 110 115* 113*  CO2 18*  --  17*  GLUCOSE 113* 92 85  BUN 45* 44* 41*  CREATININE 3.50* 3.30* 2.64*  CALCIUM 6.5*  --  5.9*   LFT  Recent Labs  09/02/15 1446  09/03/15 0657  PROT 5.9*  --   ALBUMIN 2.5* 2.3*  AST 100*  --   ALT 34  --   ALKPHOS 116  --   BILITOT 0.7  --    PT/INR  Recent Labs  09/02/15 1609  LABPROT 14.4  INR 1.10    Studies/Results: Ct Abdomen Pelvis Wo Contrast  09/02/2015  CLINICAL DATA:  History of recent falls while on blood thinners EXAM: CT ABDOMEN AND PELVIS WITHOUT CONTRAST TECHNIQUE: Multidetector CT imaging of the abdomen and pelvis was performed following the standard protocol without IV contrast. COMPARISON:  None. FINDINGS: Lung bases demonstrate some chronic fibrotic changes. No focal infiltrate or sizable effusion is seen. The liver, spleen, adrenal glands and pancreas are all normal in their CT appearance. The gallbladder is well distended and demonstrates a single dependent gallstone without complicating factors. Kidneys demonstrate bilateral nonobstructing renal stones. The ureters are within normal limits bilaterally. A few scattered hypodensities are noted within the right kidney consistent with cystic change. Aortic iliac calcifications are noted without aneurysmal dilatation. The appendix is within normal limits. Mild diverticular change is noted without diverticulitis. The bladder is well distended. Postsurgical changes are noted in the lower lumbar spine. No acute bony abnormality is seen. No soft tissue hematoma is seen. IMPRESSION: Cholelithiasis without complicating factors. No acute abnormality seen. Electronically Signed   By: Alcide Clever M.D.   On: 09/02/2015 17:33   Dg Chest 2 View  09/02/2015  CLINICAL DATA:  Fall, left shoulder and elbow pain, weakness EXAM: CHEST  2 VIEW COMPARISON:  07/2015 FINDINGS: Low lung volumes with similar chronic bibasilar interstitial opacities which correlate with areas of chronic bronchiectasis by comparison chest CT. Heart is enlarged. Prior coronary bypass changes noted. No effusion, superimposed edema or pneumothorax. Trachea midline. IMPRESSION: Cardiomegaly  without CHF or pneumonia Chronic interstitial changes and bronchiectasis particularly in the lower lobes. Electronically Signed   By: Judie Petit.  Shick M.D.   On: 09/02/2015 15:05   Ct Head Wo Contrast  09/02/2015  CLINICAL DATA:  Pain following fall 2 days prior EXAM: CT HEAD WITHOUT CONTRAST CT CERVICAL SPINE WITHOUT CONTRAST TECHNIQUE: Multidetector CT imaging of the head and cervical spine was performed following the standard protocol without intravenous contrast. Multiplanar CT image reconstructions of the cervical spine were also generated. COMPARISON:  CT head and CT cervical spine June 24, 2015; brain MRI June 26, 2015 FINDINGS: CT HEAD FINDINGS Mild diffuse atrophy is stable. There is no intracranial mass, hemorrhage, extra-axial fluid collection, or midline shift. There is a focal infarct at the left frontal -parietal lobe which is undergone evolution compared to prior studies. There is evidence of a prior infarct in the mid right occipital  lobe region, stable. There is evidence of a prior lacunar type infarct in the superior head of the caudate nucleus on the right. Decreased attenuation in the periphery of the posterior right cerebellum is felt to be due to prior infarct. This finding is somewhat better seen compared to prior CT but was present on MR examination 2 months prior and is unchanged. There is slight small vessel disease in the centra semiovale bilaterally. There is no new gray-white compartment lesion. No acute infarct evident. The bony calvarium appears intact. There is opacification of multiple mastoid air cells on the left, a finding noted previously. Right-sided mastoid air cells are clear. There is an air-fluid level in the left maxillary antrum. There is opacification in several ethmoid air cells bilaterally. No intraorbital lesions are identified. CT CERVICAL SPINE FINDINGS There is no demonstrable fracture. Slight retrolisthesis of C3 on C4 is stable. There is slight retrolisthesis of  C6 on C7, stable. These changes are consistent with underlying spondylosis. There is no new spondylolisthesis. Prevertebral soft tissues and predental space regions are normal. There is marked disc space narrowing at C3-4, C5-6, C6-7, and C7-T1, stable. There is facet hypertrophy at most levels bilaterally. There is exit foraminal narrowing at multiple levels bilaterally, stable. There is broad-based disc protrusion at C3-4, exacerbated by the spondylolisthesis. This finding again leads to mild central canal stenosis at C3-4. There is marked exit foraminal narrowing on the right at C6-7 and C7-T1, stable. There is also fairly significant exit foraminal narrowing at C6-7 on the left. There is calcification in each carotid artery. There is apical lung scarring bilaterally. IMPRESSION: CT head: Atrophy with prior infarcts, either stable or having undergone evolution compared to prior studies. No acute infarct evident. No mass, hemorrhage, or extra-axial fluid collection. Chronic left-sided mastoid disease. There is evidence of acute sinusitis with air-fluid level in left maxillary antrum. Opacification is also noted in several ethmoid air cells bilaterally. CT cervical spine: No demonstrable fracture. Areas of mild spondylolisthesis are felt to be due to underlying spondylosis is stable. 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 There is a degree of spinal stenosis at C3-4 due to disc protrusion exacerbated by spondylolisthesis. There is calcification in both carotid arteries. Electronically Signed   By: Bretta Bang III M.D.   On: 09/02/2015 15:44   Ct Cervical Spine Wo Contrast  09/02/2015  CLINICAL DATA:  Pain following fall 2 days prior EXAM: CT HEAD WITHOUT CONTRAST CT CERVICAL SPINE WITHOUT CONTRAST TECHNIQUE: Multidetector CT imaging of the head and cervical spine was performed following the standard protocol without intravenous contrast. Multiplanar CT image reconstructions of the  cervical spine were also generated. COMPARISON:  CT head and CT cervical spine June 24, 2015; brain MRI June 26, 2015 FINDINGS: CT HEAD FINDINGS Mild diffuse atrophy is stable. There is no intracranial mass, hemorrhage, extra-axial fluid collection, or midline shift. There is a focal infarct at the left frontal -parietal lobe which is undergone evolution compared to prior studies. There is evidence of a prior infarct in the mid right occipital lobe region, stable. There is evidence of a prior lacunar type infarct in the superior head of the caudate nucleus on the right. Decreased attenuation in the periphery of the posterior right cerebellum is felt to be due to prior infarct. This finding is somewhat better seen compared to prior CT but was present on MR examination 2 months prior and is unchanged. There is slight small vessel disease in the centra semiovale bilaterally. There  is no new gray-white compartment lesion. No acute infarct evident. The bony calvarium appears intact. There is opacification of multiple mastoid air cells on the left, a finding noted previously. Right-sided mastoid air cells are clear. There is an air-fluid level in the left maxillary antrum. There is opacification in several ethmoid air cells bilaterally. No intraorbital lesions are identified. CT CERVICAL SPINE FINDINGS There is no demonstrable fracture. Slight retrolisthesis of C3 on C4 is stable. There is slight retrolisthesis of C6 on C7, stable. These changes are consistent with underlying spondylosis. There is no new spondylolisthesis. Prevertebral soft tissues and predental space regions are normal. There is marked disc space narrowing at C3-4, C5-6, C6-7, and C7-T1, stable. There is facet hypertrophy at most levels bilaterally. There is exit foraminal narrowing at multiple levels bilaterally, stable. There is broad-based disc protrusion at C3-4, exacerbated by the spondylolisthesis. This finding again leads to mild central canal  stenosis at C3-4. There is marked exit foraminal narrowing on the right at C6-7 and C7-T1, stable. There is also fairly significant exit foraminal narrowing at C6-7 on the left. There is calcification in each carotid artery. There is apical lung scarring bilaterally. IMPRESSION: CT head: Atrophy with prior infarcts, either stable or having undergone evolution compared to prior studies. No acute infarct evident. No mass, hemorrhage, or extra-axial fluid collection. Chronic left-sided mastoid disease. There is evidence of acute sinusitis with air-fluid level in left maxillary antrum. Opacification is also noted in several ethmoid air cells bilaterally. CT cervical spine: No demonstrable fracture. Areas of mild spondylolisthesis are felt to be due to underlying spondylosis is stable. 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 There is a degree of spinal stenosis at C3-4 due to disc protrusion exacerbated by spondylolisthesis. There is calcification in both carotid arteries. Electronically Signed   By: Bretta Bang III M.D.   On: 09/02/2015 15:44    Impression: 69 year old male admitted with weakness, recent history of falls, found to have Hgb of 7.6, without any overt GI bleeding. Heme negative in the ED. Improved Hgb with 2 units PRBCs. Associated acute kidney injury and multiple comorbidities, Significantly elevated troponins consistent with NSTEMI and not a candidate for diagnostic cardiac catheterization in setting of multiple health issues and renal insufficiency. In setting of recent dual antiplatelets therapy (Plavix, ASA), concern for occult GI bleeding. Last EGD in Oct 2013 with erosive reflux esophagitis, patent esophagus with Maloney dilation and NSAID gastropathy. He is maintained on prednisone as an outpatient as well. No urgent EGD indicated for today but recommend an EGD in near future after full cardiology consultation completed. For now, needs serial monitoring of H/H,  supportive measurements, and we will reassess tomorrow. May have clear liquids for now.   Isolated elevation of AST: recheck LFTs and follow.   Plan: Agree with hemoccults Serial H/H Cardiology to see 12/12 May have clear liquids PPI infusion per attending Follow-up on pending iron studies Recheck HFP now Will follow along with you and reassess tomorrow, 12/12  Nira Retort, ANP-BC [facility]     LOS: 1 day    09/03/2015, 12:36 PM

## 2015-09-03 NOTE — Progress Notes (Addendum)
CRITICAL VALUE ALERT  Critical value received:  Troponin 10.29  Date of notification: 09/03/15  Time of notification: 0830  Critical value read back: yes  Nurse who received alert:  M.McDaniel  MD notified (1st page):  Dr. Seth Bake  Time of first page: [phone]  MD notified (2nd page):  Time of second page:  Responding MD: Dr. Seth Bake   Time MD responded: 1200

## 2015-09-03 NOTE — Progress Notes (Signed)
Notified daughter of patient transfer to icu bed.

## 2015-09-03 NOTE — Progress Notes (Signed)
Patient in room resting. Laboratory called to report elevated troponin and unchanged  Hemoglobin.  Explained to patient that he would have to be transferred to icu for increased level of care.  Patient seemed upset by move, and stated not to notify family at this time. Vitals stable, no complaints at this time.

## 2015-09-03 NOTE — Progress Notes (Addendum)
The first troponin I ordered came back at 9. I spoke with cardiology on call, Dr Loney Loh, and he felt that NSTEMI could be from one of his graft being clotted off. Since he likely has a GI Bleed, and one unit of PRBCs did not improve his Hb, will hold off on ASA or IV Heparin.  Will transfer him to ICU, continue with cycling his troponins, continue with betablocker and max dose of Lipitor, transfuse him another unit of PRBCs, and consult our cardiology team here tomorrow.  Dr Loney Loh does not feel he needs to be transferred as no invasive intervention is required at this time, especially in the setting of AKI.

## 2015-09-03 NOTE — Progress Notes (Signed)
Patient Demographics  [name], is a 69 y.o. male, DOB - [birth year], EEF:[id number]  Admit date - 09/02/2015   Admitting Physician Houston Siren, MD  Outpatient Primary MD for the patient is Lubertha South, MD  LOS - 1   Chief Complaint  Patient presents with  . Fall       Admission HPI/Brief narrative: 69 y.o. male with recent admission in Oct 2016 for acute CVA, past medical history  of HLD, pulmonary fibrosis thought to be due to methotrexate for his RA, afib, HTN, prior tobacco abuse, RA on chronic steroid with secondary adrenal insufficiency, chronic back pain, presents with generalized weakness, multiple falls, workup significant for anemia 7.6, slight hemoglobin 10-11, as well as elevated troponin, and acute renal failure, he is on aspirin and Plavix, no melena,received  2 units PRBC 09/02/2015.  Subjective:   [name] today has, No headache, No chest pain, No abdominal pain , complaints of mild nausea,  and hip pain .  Assessment & Plan    Principal Problem:   Anemia Active Problems:   Rheumatoid arthritis (HCC)   Diabetes mellitus (HCC)   Chronic lung disease   Chronic pain syndrome   Secondary adrenal insufficiency (HCC)   Stroke (HCC)   AKI (acute kidney injury) (HCC)  acute renal failure  - This is due to volume depletion from anemia, and home meds including  lisinopril, Cozaar, Lasix as needed - Improving with IV fluids, continue to hold nephrotoxic medication  NSTEMI. - She was significantly elevated troponin, denies any chest pain or shortness of breath, EKG nonacute, discussed with cardiology, the plan is to continue with conservative management as his mother candidate for cardiac cath currently given his acute renal failure, unfortunately can't start on anticoagulation giving his anemia. - Continue with beta blockers and statin   anemia  - patient was significant drop on  hemoglobin ,  this line 10-11, was 70 on presentation, transfused 2 units PRBC with good response,  normal color stool on rectal exam , hemoccult pending,  GI consult appreciated, patient is on dual antiplatelet therapy, most likely upper GI bleed,  - will check CT Hip to R/O any hematoma, as patient reports couple. Last week and hip pain. - Monittor H&H closely and transfuse as needed. - cont with protonix drip. - GI consult appreciated   CVA  - Continue to hold aspirin and Plavix , please see above discussion .  secondary abdominal insufficiency  - Continue stress dose steroids  - Continue with statin .     Code Status: Full  Family Communication: discussed with wife and daughter at bedside   Disposition Plan:remains in ICU   Procedures  none   Consults   Gastroenterology Discussed with cardiology    Medications  Scheduled Meds: . atenolol  25 mg Oral BID  . atorvastatin  80 mg Oral Daily  . finasteride  5 mg Oral Daily  . folic acid  1 mg Oral Daily  . levothyroxine  88 mcg Oral QAC breakfast  . lidocaine  1 patch Transdermal Q24H  . [START ON 09/06/2015] pantoprazole (PROTONIX) IV  40 mg Intravenous Q12H  . sodium chloride  3 mL Intravenous Q12H  . tamsulosin  0.4 mg Oral Daily  . [  START ON 09/04/2015] Vitamin D (Ergocalciferol)  50,000 Units Oral Q Mon   Continuous Infusions: . sodium chloride 75 mL/hr at 09/03/15 1258  . pantoprozole (PROTONIX) infusion 8 mg/hr (09/03/15 0813)   PRN Meds:.ALPRAZolam, HYDROcodone-acetaminophen, ondansetron (ZOFRAN) IV  DVT Prophylaxis  SCDs   Lab Results  Component Value Date   PLT 89* 09/03/2015    Antibiotics    Anti-infectives    None          Objective:   Filed Vitals:   09/03/15 1000 09/03/15 1200 09/03/15 1300 09/03/15 1400  BP: 155/76  169/81 145/82  Pulse: 64  63 73  Temp:  98 F (36.7 C)    TempSrc:  Axillary    Resp: 18  17 23   Height:      Weight:      SpO2: 96%  97% 100%    Wt  Readings from Last 3 Encounters:  09/03/15 60.9 kg (134 lb 4.2 oz)  08/30/15 60.328 kg (133 lb)  08/07/15 61.689 kg (136 lb)     Intake/Output Summary (Last 24 hours) at 09/03/15 1443 Last data filed at 09/03/15 1350  Gross per 24 hour  Intake   1435 ml  Output   1052 ml  Net    383 ml     Physical Exam  Awake Alert, Oriented X 3,  Gem.AT,PERRAL Supple Neck,No JVD, No cervical lymphadenopathy appriciated.  Symmetrical Chest wall movement, Good air movement bilaterally, CTAB RRR,No Gallops,Rubs or new Murmurs, No Parasternal Heave +ve B.Sounds, Abd Soft, No tenderness, No organomegaly appriciated, No rebound - guarding or rigidity. No Cyanosis, Clubbing or edema, No new Rash or bruise     Data Review   Micro Results Recent Results (from the past 240 hour(s))  MRSA PCR Screening     Status: None   Collection Time: 09/03/15  2:30 AM  Result Value Ref Range Status   MRSA by PCR NEGATIVE NEGATIVE Final    Comment:        The GeneXpert MRSA Assay (FDA approved for NASAL specimens only), is one component of a comprehensive MRSA colonization surveillance program. It is not intended to diagnose MRSA infection nor to guide or monitor treatment for MRSA infections.     Radiology Reports Ct Abdomen Pelvis Wo Contrast  09/02/2015  CLINICAL DATA:  History of recent falls while on blood thinners EXAM: CT ABDOMEN AND PELVIS WITHOUT CONTRAST TECHNIQUE: Multidetector CT imaging of the abdomen and pelvis was performed following the standard protocol without IV contrast. COMPARISON:  None. FINDINGS: Lung bases demonstrate some chronic fibrotic changes. No focal infiltrate or sizable effusion is seen. The liver, spleen, adrenal glands and pancreas are all normal in their CT appearance. The gallbladder is well distended and demonstrates a single dependent gallstone without complicating factors. Kidneys demonstrate bilateral nonobstructing renal stones. The ureters are within normal limits  bilaterally. A few scattered hypodensities are noted within the right kidney consistent with cystic change. Aortic iliac calcifications are noted without aneurysmal dilatation. The appendix is within normal limits. Mild diverticular change is noted without diverticulitis. The bladder is well distended. Postsurgical changes are noted in the lower lumbar spine. No acute bony abnormality is seen. No soft tissue hematoma is seen. IMPRESSION: Cholelithiasis without complicating factors. No acute abnormality seen. Electronically Signed   By: Alcide Clever M.D.   On: 09/02/2015 17:33   Dg Chest 2 View  09/02/2015  CLINICAL DATA:  Fall, left shoulder and elbow pain, weakness EXAM: CHEST  2 VIEW COMPARISON:  07/2015 FINDINGS: Low lung volumes with similar chronic bibasilar interstitial opacities which correlate with areas of chronic bronchiectasis by comparison chest CT. Heart is enlarged. Prior coronary bypass changes noted. No effusion, superimposed edema or pneumothorax. Trachea midline. IMPRESSION: Cardiomegaly without CHF or pneumonia Chronic interstitial changes and bronchiectasis particularly in the lower lobes. Electronically Signed   By: Judie Petit.  Shick M.D.   On: 09/02/2015 15:05   Ct Head Wo Contrast  09/02/2015  CLINICAL DATA:  Pain following fall 2 days prior EXAM: CT HEAD WITHOUT CONTRAST CT CERVICAL SPINE WITHOUT CONTRAST TECHNIQUE: Multidetector CT imaging of the head and cervical spine was performed following the standard protocol without intravenous contrast. Multiplanar CT image reconstructions of the cervical spine were also generated. COMPARISON:  CT head and CT cervical spine June 24, 2015; brain MRI June 26, 2015 FINDINGS: CT HEAD FINDINGS Mild diffuse atrophy is stable. There is no intracranial mass, hemorrhage, extra-axial fluid collection, or midline shift. There is a focal infarct at the left frontal -parietal lobe which is undergone evolution compared to prior studies. There is evidence of a  prior infarct in the mid right occipital lobe region, stable. There is evidence of a prior lacunar type infarct in the superior head of the caudate nucleus on the right. Decreased attenuation in the periphery of the posterior right cerebellum is felt to be due to prior infarct. This finding is somewhat better seen compared to prior CT but was present on MR examination 2 months prior and is unchanged. There is slight small vessel disease in the centra semiovale bilaterally. There is no new gray-white compartment lesion. No acute infarct evident. The bony calvarium appears intact. There is opacification of multiple mastoid air cells on the left, a finding noted previously. Right-sided mastoid air cells are clear. There is an air-fluid level in the left maxillary antrum. There is opacification in several ethmoid air cells bilaterally. No intraorbital lesions are identified. CT CERVICAL SPINE FINDINGS There is no demonstrable fracture. Slight retrolisthesis of C3 on C4 is stable. There is slight retrolisthesis of C6 on C7, stable. These changes are consistent with underlying spondylosis. There is no new spondylolisthesis. Prevertebral soft tissues and predental space regions are normal. There is marked disc space narrowing at C3-4, C5-6, C6-7, and C7-T1, stable. There is facet hypertrophy at most levels bilaterally. There is exit foraminal narrowing at multiple levels bilaterally, stable. There is broad-based disc protrusion at C3-4, exacerbated by the spondylolisthesis. This finding again leads to mild central canal stenosis at C3-4. There is marked exit foraminal narrowing on the right at C6-7 and C7-T1, stable. There is also fairly significant exit foraminal narrowing at C6-7 on the left. There is calcification in each carotid artery. There is apical lung scarring bilaterally. IMPRESSION: CT head: Atrophy with prior infarcts, either stable or having undergone evolution compared to prior studies. No acute infarct  evident. No mass, hemorrhage, or extra-axial fluid collection. Chronic left-sided mastoid disease. There is evidence of acute sinusitis with air-fluid level in left maxillary antrum. Opacification is also noted in several ethmoid air cells bilaterally. CT cervical spine: No demonstrable fracture. Areas of mild spondylolisthesis are felt to be due to underlying spondylosis is stable. 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 There is a degree of spinal stenosis at C3-4 due to disc protrusion exacerbated by spondylolisthesis. There is calcification in both carotid arteries. Electronically Signed   By: Bretta Bang III M.D.   On: 09/02/2015 15:44   Ct Cervical Spine Wo Contrast  09/02/2015  CLINICAL DATA:  Pain following fall 2 days prior EXAM: CT HEAD WITHOUT CONTRAST CT CERVICAL SPINE WITHOUT CONTRAST TECHNIQUE: Multidetector CT imaging of the head and cervical spine was performed following the standard protocol without intravenous contrast. Multiplanar CT image reconstructions of the cervical spine were also generated. COMPARISON:  CT head and CT cervical spine June 24, 2015; brain MRI June 26, 2015 FINDINGS: CT HEAD FINDINGS Mild diffuse atrophy is stable. There is no intracranial mass, hemorrhage, extra-axial fluid collection, or midline shift. There is a focal infarct at the left frontal -parietal lobe which is undergone evolution compared to prior studies. There is evidence of a prior infarct in the mid right occipital lobe region, stable. There is evidence of a prior lacunar type infarct in the superior head of the caudate nucleus on the right. Decreased attenuation in the periphery of the posterior right cerebellum is felt to be due to prior infarct. This finding is somewhat better seen compared to prior CT but was present on MR examination 2 months prior and is unchanged. There is slight small vessel disease in the centra semiovale bilaterally. There is no new gray-white  compartment lesion. No acute infarct evident. The bony calvarium appears intact. There is opacification of multiple mastoid air cells on the left, a finding noted previously. Right-sided mastoid air cells are clear. There is an air-fluid level in the left maxillary antrum. There is opacification in several ethmoid air cells bilaterally. No intraorbital lesions are identified. CT CERVICAL SPINE FINDINGS There is no demonstrable fracture. Slight retrolisthesis of C3 on C4 is stable. There is slight retrolisthesis of C6 on C7, stable. These changes are consistent with underlying spondylosis. There is no new spondylolisthesis. Prevertebral soft tissues and predental space regions are normal. There is marked disc space narrowing at C3-4, C5-6, C6-7, and C7-T1, stable. There is facet hypertrophy at most levels bilaterally. There is exit foraminal narrowing at multiple levels bilaterally, stable. There is broad-based disc protrusion at C3-4, exacerbated by the spondylolisthesis. This finding again leads to mild central canal stenosis at C3-4. There is marked exit foraminal narrowing on the right at C6-7 and C7-T1, stable. There is also fairly significant exit foraminal narrowing at C6-7 on the left. There is calcification in each carotid artery. There is apical lung scarring bilaterally. IMPRESSION: CT head: Atrophy with prior infarcts, either stable or having undergone evolution compared to prior studies. No acute infarct evident. No mass, hemorrhage, or extra-axial fluid collection. Chronic left-sided mastoid disease. There is evidence of acute sinusitis with air-fluid level in left maxillary antrum. Opacification is also noted in several ethmoid air cells bilaterally. CT cervical spine: No demonstrable fracture. Areas of mild spondylolisthesis are felt to be due to underlying spondylosis is stable. 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 There is a degree of spinal stenosis at C3-4 due to disc  protrusion exacerbated by spondylolisthesis. There is calcification in both carotid arteries. Electronically Signed   By: Bretta Bang III M.D.   On: 09/02/2015 15:44     CBC  Recent Labs Lab 09/02/15 1446 09/02/15 1729 09/02/15 2236 09/03/15 0657 09/03/15 1343  WBC 15.5*  --   --  15.6*  --   HGB 7.6* 7.8* 7.9* 9.6* 10.5*  HCT 24.3* 23.0* 24.9* 29.8* 31.9*  PLT 131*  --   --  89*  --   MCV 93.1  --   --  88.2  --   MCH 29.1  --   --  28.4  --  MCHC 31.3  --   --  32.2  --   RDW 18.9*  --   --  19.4*  --   LYMPHSABS 1.3  --   --   --   --   MONOABS 0.6  --   --   --   --   EOSABS 0.2  --   --   --   --   BASOSABS 0.0  --   --   --   --     Chemistries   Recent Labs Lab 09/02/15 1446 09/02/15 1729 09/03/15 0657 09/03/15 1343  NA 140 139 140  --   K 4.7 4.5 4.3  --   CL 110 115* 113*  --   CO2 18*  --  17*  --   GLUCOSE 113* 92 85  --   BUN 45* 44* 41*  --   CREATININE 3.50* 3.30* 2.64*  --   CALCIUM 6.5*  --  5.9*  --   AST 100*  --   --  95*  ALT 34  --   --  36  ALKPHOS 116  --   --  133*  BILITOT 0.7  --   --  1.1   ------------------------------------------------------------------------------------------------------------------ estimated creatinine clearance is 22.4 mL/min (by C-G formula based on Cr of 2.64). ------------------------------------------------------------------------------------------------------------------ No results for input(s): HGBA1C in the last 72 hours. ------------------------------------------------------------------------------------------------------------------ No results for input(s): CHOL, HDL, LDLCALC, TRIG, CHOLHDL, LDLDIRECT in the last 72 hours. ------------------------------------------------------------------------------------------------------------------ No results for input(s): TSH, T4TOTAL, T3FREE, THYROIDAB in the last 72 hours.  Invalid input(s):  FREET3 ------------------------------------------------------------------------------------------------------------------ No results for input(s): VITAMINB12, FOLATE, FERRITIN, TIBC, IRON, RETICCTPCT in the last 72 hours.  Coagulation profile  Recent Labs Lab 09/02/15 1609  INR 1.10    No results for input(s): DDIMER in the last 72 hours.  Cardiac Enzymes  Recent Labs Lab 09/02/15 2236 09/03/15 0657 09/03/15 1343  TROPONINI 9.24* 10.29* 8.76*   ------------------------------------------------------------------------------------------------------------------ Invalid input(s): POCBNP     Time Spent in minutes   40 minutes   Romilda Proby M.D on 09/03/2015 at 2:43 PM  Between 7am to 7pm - Pager - [phone]  After 7pm go to [email or web address] - password [facility]  [facility]   Office  [phone]

## 2015-09-03 NOTE — Progress Notes (Signed)
CRITICAL VALUE ALERT  Critical value received:  Troponin and hemoglobin  Date of notification: 09-02-15  Time of notification:  2355 Critical value read back:yes  Nurse who received alert: b Milana Obey  MD notified (1st page): le, peter  Time of first page: 0010  MD notified (2nd page):  Time of second page:  Responding MD: le, peter  Md paged on critical troponin and hemoglobin.  Received orders to transfer. Patient in otherwise stable condition. Asked patient if he would like to notify family, patient stated not as this time. Patient transferred to icu in stable condition.   Time MD responded: [id number]

## 2015-09-04 ENCOUNTER — Inpatient Hospital Stay (HOSPITAL_COMMUNITY): Payer: Medicare Other

## 2015-09-04 DIAGNOSIS — I2510 Atherosclerotic heart disease of native coronary artery without angina pectoris: Secondary | ICD-10-CM

## 2015-09-04 DIAGNOSIS — R1030 Lower abdominal pain, unspecified: Secondary | ICD-10-CM

## 2015-09-04 DIAGNOSIS — I639 Cerebral infarction, unspecified: Secondary | ICD-10-CM

## 2015-09-04 DIAGNOSIS — I214 Non-ST elevation (NSTEMI) myocardial infarction: Secondary | ICD-10-CM | POA: Insufficient documentation

## 2015-09-04 DIAGNOSIS — R1013 Epigastric pain: Secondary | ICD-10-CM

## 2015-09-04 DIAGNOSIS — R109 Unspecified abdominal pain: Secondary | ICD-10-CM | POA: Insufficient documentation

## 2015-09-04 LAB — TYPE AND SCREEN
ABO/RH(D): A POS
Antibody Screen: NEGATIVE
UNIT DIVISION: 0
UNIT DIVISION: 0

## 2015-09-04 LAB — HEPATIC FUNCTION PANEL
ALK PHOS: 109 U/L (ref 38–126)
ALT: 31 U/L (ref 17–63)
AST: 78 U/L — ABNORMAL HIGH (ref 15–41)
Albumin: 2.2 g/dL — ABNORMAL LOW (ref 3.5–5.0)
BILIRUBIN INDIRECT: 0.5 mg/dL (ref 0.3–0.9)
BILIRUBIN TOTAL: 0.9 mg/dL (ref 0.3–1.2)
Bilirubin, Direct: 0.4 mg/dL (ref 0.1–0.5)
Total Protein: 5.3 g/dL — ABNORMAL LOW (ref 6.5–8.1)

## 2015-09-04 LAB — BASIC METABOLIC PANEL
ANION GAP: 8 (ref 5–15)
BUN: 39 mg/dL — ABNORMAL HIGH (ref 6–20)
CALCIUM: 7.1 mg/dL — AB (ref 8.9–10.3)
CHLORIDE: 118 mmol/L — AB (ref 101–111)
CO2: 16 mmol/L — AB (ref 22–32)
Creatinine, Ser: 2.1 mg/dL — ABNORMAL HIGH (ref 0.61–1.24)
GFR calc non Af Amer: 30 mL/min — ABNORMAL LOW (ref >60)
GFR, EST AFRICAN AMERICAN: 35 mL/min — AB (ref >60)
GLUCOSE: 92 mg/dL (ref 65–99)
Potassium: 4.1 mmol/L (ref 3.5–5.1)
Sodium: 142 mmol/L (ref 135–145)

## 2015-09-04 LAB — URINE MICROSCOPIC-ADD ON: Squamous Epithelial / LPF: NONE SEEN

## 2015-09-04 LAB — CBC
HEMATOCRIT: 30.9 % — AB (ref 39.0–52.0)
HEMOGLOBIN: 10 g/dL — AB (ref 13.0–17.0)
MCH: 28.4 pg (ref 26.0–34.0)
MCHC: 32.4 g/dL (ref 30.0–36.0)
MCV: 87.8 fL (ref 78.0–100.0)
Platelets: 95 10*3/uL — ABNORMAL LOW (ref 150–400)
RBC: 3.52 MIL/uL — ABNORMAL LOW (ref 4.22–5.81)
RDW: 19.9 % — AB (ref 11.5–15.5)
WBC: 12.6 10*3/uL — AB (ref 4.0–10.5)

## 2015-09-04 LAB — HEMOGLOBIN AND HEMATOCRIT, BLOOD
HCT: 33.4 % — ABNORMAL LOW (ref 39.0–52.0)
HEMOGLOBIN: 10.8 g/dL — AB (ref 13.0–17.0)

## 2015-09-04 LAB — URINALYSIS, ROUTINE W REFLEX MICROSCOPIC
BILIRUBIN URINE: NEGATIVE
GLUCOSE, UA: NEGATIVE mg/dL
Ketones, ur: 15 mg/dL — AB
Leukocytes, UA: NEGATIVE
Nitrite: NEGATIVE
PH: 6 (ref 5.0–8.0)
Protein, ur: 30 mg/dL — AB
SPECIFIC GRAVITY, URINE: 1.025 (ref 1.005–1.030)

## 2015-09-04 LAB — GLUCOSE, CAPILLARY: Glucose-Capillary: 70 mg/dL (ref 65–99)

## 2015-09-04 LAB — TROPONIN I: Troponin I: 5.48 ng/mL (ref <0.031)

## 2015-09-04 MED ORDER — HYDRALAZINE HCL 20 MG/ML IJ SOLN: 10.0000 mg | Freq: Four times a day (QID) | INTRAMUSCULAR | Status: DC | PRN

## 2015-09-04 MED ORDER — COLLAGENASE 250 UNIT/GM EX OINT
TOPICAL_OINTMENT | Freq: Every day | CUTANEOUS | Status: DC
  Administered 2015-09-04: 18:00:00 via TOPICAL
  Administered 2015-09-05: 1 via TOPICAL
  Administered 2015-09-06 – 2015-09-07 (×2): via TOPICAL
  Filled 2015-09-04: qty 30

## 2015-09-04 MED ORDER — MAGNESIUM CITRATE PO SOLN
1.0000 | Freq: Once | ORAL | Status: AC
  Administered 2015-09-04: 1 via ORAL
  Filled 2015-09-04: qty 296

## 2015-09-04 MED ORDER — ASPIRIN 325 MG PO TABS: 325.0000 mg | ORAL_TABLET | Freq: Every day | ORAL | Status: DC

## 2015-09-04 MED ORDER — ASPIRIN 325 MG PO TABS
325.0000 mg | ORAL_TABLET | Freq: Every day | ORAL | Status: DC
  Administered 2015-09-05 – 2015-09-06 (×2): 325 mg via ORAL
  Filled 2015-09-04 (×4): qty 1

## 2015-09-04 MED ORDER — ISOSORBIDE MONONITRATE ER 60 MG PO TB24
30.0000 mg | ORAL_TABLET | Freq: Every day | ORAL | Status: DC
  Administered 2015-09-04 – 2015-09-07 (×4): 30 mg via ORAL
  Filled 2015-09-04 (×4): qty 1

## 2015-09-04 MED ORDER — HYDRALAZINE HCL 25 MG PO TABS
25.0000 mg | ORAL_TABLET | Freq: Three times a day (TID) | ORAL | Status: DC
  Administered 2015-09-04 – 2015-09-05 (×4): 25 mg via ORAL
  Filled 2015-09-04 (×4): qty 1

## 2015-09-04 MED ORDER — MORPHINE SULFATE (PF) 2 MG/ML IV SOLN
1.0000 mg | INTRAVENOUS | Status: DC | PRN
  Administered 2015-09-04 (×2): 1 mg via INTRAVENOUS
  Filled 2015-09-04 (×2): qty 1

## 2015-09-04 MED ORDER — PREDNISONE 10 MG PO TABS
10.0000 mg | ORAL_TABLET | Freq: Every day | ORAL | Status: DC
Start: 2015-09-04 — End: 2015-09-07
  Administered 2015-09-04 – 2015-09-07 (×4): 10 mg via ORAL
  Filled 2015-09-04 (×4): qty 1

## 2015-09-04 NOTE — Consult Note (Signed)
CARDIOLOGY CONSULT NOTE   Patient ID: [name] MRN: [id number] DOB/AGE: 12-27-1945 69 y.o.  Admit Date: 09/02/2015 Referring Physician: TR[facility]-Elgergawy Primary Physician: Fernando South, [name] Consulting Cardiologist: [name] Primary Cardiologist: [name] Reason for Consultation: NSTEMI  Clinical Summary [name] is a 69 y.o.male with known history CAD, CABG 1998,atrial fib, PAD with SFA to Popliteal with left iliac stent, hypertension, hyperlipidemia, pulmonary hypertension with chronic lung disease, AKI,  and CVA in 06/2015 on DAPT with ASA and Plavix. He presented to ER with complaints of falling and weakness, with anemia, Hgb of 7.6. He was given two units of PRBC's. GI is consulting.    He was found to have positive troponin 9.24; 10.29; 8.76; 5.48 respectively. [name] spoke with [name] over the weekend. Per [name] note, "patient not good candidate for diagnostic cardiac catheterization at this time in light of his comorbidities, particularly acute renal insufficiency. Recommend medical management and stabilization. Would hold off on anticoagulation, and agree with holding antiplatelet therapy for now. "  We are asked to follow this patient and make recommendations on management. EKG demonstrated NSR with PAC's and prior inferior MI. Nonspecific changes sepal.   He states that he has been feeling weak for the last couple of weeks. He had two episodes of heart burn on separate occasions. He has been feeling weaker with stomach pain since that time. He fell once in the bathroom after having a bowel movement 2 days ago, hitting his knee on the floor and hitting his arms on a chair in  the bathroom and had to be helped to his feet. The second day he had a bowel movement got up from the commode and fell again only unable to get up. EMS brought to ER He denies melena, Hematemesis, Hematuria, or Epistaxis.     On arrival to ER BP 95/57. HR 71, O2 Sat  97.9 afebrile,. Lactic acid 2.26. Creatinine 3.30, BUN 44. CT head is negative for acute CVA, but positive for sinusitis. CXR negative for CHF or pneumonia. EKG negative for acute ST/T wave changes. Hgb 7.6, Hct 24.3, PLTS 131. U/A positive for large amount of Hgb with non-obstructing renal stones bilaterally. He has been placed on prednisone due to adrenal insufficiency.  DAPT has been held in the setting of anemia.      Allergies  Allergen Reactions  . Clindamycin/Lincomycin Other (See Comments)    Kidney Failure  . Amoxicillin Hives and Other (See Comments)    Hallucinations   . Ciprofloxacin Other (See Comments)    C-diff  . Other Hives, Diarrhea and Other (See Comments)    "Mycin" Causes C-diff  . Ceftin [Cefuroxime Axetil]     Taste like rusty iron, nausea,  . Doxycycline Nausea And Vomiting  . Levofloxacin Nausea And Vomiting    Medications Scheduled Medications: . aspirin  325 mg Oral Daily  . atenolol  25 mg Oral BID  . atorvastatin  80 mg Oral Daily  . finasteride  5 mg Oral Daily  . folic acid  1 mg Oral Daily  . hydrALAZINE  25 mg Oral 3 times per day  . isosorbide mononitrate  30 mg Oral Daily  . levothyroxine  88 mcg Oral QAC breakfast  . lidocaine  1 patch Transdermal Q24H  . magnesium citrate  1 Bottle Oral Once  . [START ON 09/06/2015] pantoprazole (PROTONIX) IV  40 mg Intravenous Q12H  . sodium chloride  3 mL Intravenous Q12H  .  tamsulosin  0.4 mg Oral Daily  . Vitamin D (Ergocalciferol)  50,000 Units Oral Q Mon    Infusions: . sodium chloride 75 mL/hr at 09/04/15 0258  . pantoprozole (PROTONIX) infusion 8 mg/hr (09/04/15 0630)    PRN Medications: ALPRAZolam, HYDROcodone-acetaminophen, ondansetron (ZOFRAN) IV   Past Medical History  Diagnosis Date  . 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 Hyperlipidemia   . Hypertension   . PVD (peripheral vascular disease) (HCC)     Left iliac PCI/stent  . Cerebrovascular disease   .  Tobacco abuse, in remission     Remote  . GERD (gastroesophageal reflux disease)   . Hypothyroid   . Allergic rhinitis   . Chronic lung disease     ?  Rheumatoid lung; ?  Adverse reaction to methotrexate  . Schatzki's ring   . Hx of Clostridium difficile infection     Recurrent; associated 40 pound weight loss  . Achalasia   . Diabetes mellitus     borderline no meds  . Atrial fibrillation (HCC)   . Chronic back pain   . 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 Cervical spondylosis   . DDD (degenerative disc disease), lumbar   . Pneumonia   . Carotid artery occlusion   . Nephrolithiasis 2010    s/p lithotripsy  . Hiatal hernia     Past Surgical History  Procedure Laterality Date  . Coronary artery bypass graft      x6 In 1998  . Total knee arthroplasty      Right  . Ankle fusion      Left  . Wrist fusion      Right  . Shoulder surgery    . Colonoscopy  09/12/2011    Dr. Elly [name] hemorrhoids, normal colon and distal terminal ileum. Random bx negative. Stool for CDiff positive.  . Esophagogastroduodenoscopy  09/13/2011    Dr. Lyndle [name] plawues mid-esophagus KOH negative. Distal esophageal ring and ulcer. Mild gastritis. duodenal diverticulum. [name] [name] 86mm.   [name]  09/13/2011  . Esophageal manometry  09/30/2011    Procedure: ESOPHAGEAL MANOMETRY (EM);  Surgeon: [name];  Location: [facility];  Service: Endoscopy;  Laterality: N/A;  . Esophagogastroduodenoscopy  09/12/2011    Dr. Rinaldo [name] [name] s/p [name]  . Cardiac surgery    . Esophagomyotomy  11/12/11    [name] [place]- with DOR antireflux surgery  . Esophagogastroduodenoscopy  06/25/2012    Dr. Jena [name]: erosive reflux esophagitis, patent esophagus s/p malony [name], gastric erosions/ulcerations consistent with NSAID gastropathy, neg H.pylori   . Savory [name]  06/25/2012    Procedure: SAVORY [name];  Surgeon: [name];  Location: [facility];   Service: Endoscopy;  Laterality: N/A;  [name]  06/25/2012    Procedure: [name];  Surgeon: [name];  Location: [facility];  Service: Endoscopy;  Laterality: N/A;  . Ankle fusion  09/01/2012    Procedure: ANKLE FUSION;  Surgeon: Valeria Batman, [name];  Location: [name];  Service: Orthopedics;  Laterality: Left;  Take down of angulated tibial/fibula Fractures  . Femoral-tibial bypass graft Left 01/28/2013    Procedure: BYPASS GRAFT FEMORAL-TIBIAL ARTERY;  Surgeon: Nada Libman, [name];  Location: [facility] OR;  Service: Vascular;  Laterality: Left;  Ultrasound Guided  . Amputation Left 01/28/2013    Procedure: AMPUTATION LEFT 5TH TOE;  Surgeon: Nada Libman, [name];  Location: [name];  Service: Vascular;  Laterality: Left;  .  Spine surgery  11-03-13  . Abdominal aortagram N/A 01/01/2013    Procedure: ABDOMINAL Ronny Flurry;  Surgeon: Sherren Kerns, [name];  Location: [facility] CATH LAB;  Service: Cardiovascular;  Laterality: N/A;  . Abdominal aortagram N/A 10/05/2013    Procedure: ABDOMINAL AORTAGRAM;  Surgeon: Nada Libman, [name];  Location: [facility] CATH LAB;  Service: Cardiovascular;  Laterality: N/A;  . Lower extremity angiogram Left 10/05/2013    Procedure: LOWER EXTREMITY ANGIOGRAM;  Surgeon: Nada Libman, [name];  Location: [facility] CATH LAB;  Service: Cardiovascular;  Laterality: Left;  . Abdominal aortagram N/A 11/01/2014    Procedure: ABDOMINAL Ronny Flurry;  Surgeon: Nada Libman, [name];  Location: [facility] CATH LAB;  Service: Cardiovascular;  Laterality: N/A;  . Joint replacement Right 1999    Knee  . Toe amputation Left 09/05/2014    Removed left fifth toe  . Cataract extraction w/phaco Left 12/22/2014    Procedure: CATARACT EXTRACTION PHACO AND INTRAOCULAR LENS PLACEMENT (IOC);  Surgeon: Gemma Payor, [name];  Location: [facility] ORS;  Service: Ophthalmology;  Laterality: Left;  CDE:11.89  . Cataract extraction w/phaco Right 01/30/2015    Procedure: CATARACT EXTRACTION PHACO AND INTRAOCULAR LENS PLACEMENT RIGHT  EYE;  Surgeon: Gemma Payor, [name];  Location: [facility] ORS;  Service: Ophthalmology;  Laterality: Right;  CDE:10.56  . Eye surgery Left December 22, 2014    Cataract  . Eye surgery Right December 31, 2014    Cataract    Family History  Problem Relation Age of Onset  . Stomach cancer Mother 31  . Cancer Mother     Stomach  . Heart disease Mother   . Heart attack Mother   . Heart attack Father   . Heart disease Father   . Heart disease Brother   . Leukemia Brother   . Lung disease Son     infant  . Lung disease Daughter     infant  . Colon cancer Neg Hx     Social History Mr. Chuba reports that he quit smoking about 49 years ago. His smoking use included Cigarettes. He started smoking about 58 years ago. He has a 2.5 pack-year smoking history. He has quit using smokeless tobacco. Mr. Pieroni reports that he does not drink alcohol.  Review of Systems Complete review of systems are found to be negative unless outlined in H&P above.  Physical Examination Blood pressure 193/93, pulse 61, temperature 98.2 F (36.8 C), temperature source Oral, resp. rate 17, height  (1.727 m), weight 135 lb 2.3 oz (61.3 kg), SpO2 97 %.  Intake/Output Summary (Last 24 hours) at 09/04/15 0739 Last data filed at 09/04/15 0500  Gross per 24 hour  Intake    120 ml  Output   1225 ml  Net  -1105 ml    Telemetry: SR with PAC's.   GEN: Complaining of abdominal pain and arthritis pain.  HEENT: Conjunctiva and lids normal, oropharynx clear with moist mucosa. Neck: Supple, no elevated JVP or carotid bruits, no thyromegaly. Lungs: Clear to auscultation,mild crackles in the bases. Cardiac: Regular rate and rhythm, no S3 or significant systolic murmur, no pericardial rub. Abdomen: Soft, tender, no hepatomegaly, bowel sounds present, no guarding or rebound. Extremities: No pitting edema, distal pulses 2+.Bilateral well healed incisions interior aspect of legs.  Skin: Warm and dry. Musculoskeletal: No  kyphosis. Neuropsychiatric: Alert and oriented x3, affect grossly appropriate.  Prior Cardiac Testing/Procedures  Cardiac Cath 10/2007 HEMODYNAMIC RESULTS: Left ventricle 139/16 mmHg. Aorta 136/67 mmHg.  ANGIOGRAPHIC FINDINGS:  1. Left main coronary artery  is severely diseased to approximately 95%  in diffuse fashion. This has progressed compared to the previous  angiogram. This vessel gives rise to the left anterior descending,  a very small ramus intermedius, and circumflex vessels.  2. The left anterior descending is occluded proximally.  3. The circumflex coronary artery is occluded proximally.  4. The right coronary artery is occluded proximally just after the  takeoff of a right ventricular marginal Emilly Lavey. There are some  right to right bridging collaterals noted filling the right  coronary artery nearly down to the distal portion.  5. The saphenous vein graft to the first and second obtuse marginals  is widely patent. There is an area of 30-40% stenosis within the  proximal vessel although not clearly flow-limiting. The  anastomotic sites look to be intact.  6. There is known occlusion of the saphenous vein graft of the right  coronary system.  7. The LIMA to the left anterior descending and diagonal is widely  patent. Anastomotic sites are intact. Of note, the distal left  anterior descending is diffusely diseased, and there is an apical  90% stenosis noted. The diagonal Alexandar Weisenberger is also diffusely diseased  and relatively small.  8. There is some left-to-right collateralization of the distal right  coronary artery system, although not well-developed.  Ventriculography is performed in the RAO projection and reveals an  ejection fraction of approximately 60% with no significant wall motion  abnormality and no mitral regurgitation.  DIAGNOSES:  1. Severe native coronary artery disease as outlined. There has been  progression in left main disease,  although continued occlusion  proximally of the left anterior descending and circumflex vessels.  The distal left anterior descending is diffusely diseased, and  there is some left-to-right collateralization of the occluded right  coronary artery although not to a large degree.  2. Patent left internal mammary artery to left anterior descending and  diagonal, patent saphenous vein graft to the first and second  obtuse marginal with 30-40% proximal vein graft stenosis (not  clearly flow-limiting), and known occlusion of the saphenous vein  graft to posterior descending and right coronary artery.  3. Left ventricular ejection fraction of approximately 60% with no  large focal wall motion abnormality and a left ventricular end-  diastolic pressure of 16 mmHg. No significant mitral regurgitation  is noted.  DISCUSSION: No obvious revascularization options noted at this time.  Suspect that angina may well be related to progression in the patient's  native disease rather than new hemodynamically significant occlusions  within his remaining 2 bypass grafts which are widely patent. There is  30-40% stenosis within the saphenous vein graft of the obtuse marginal  system, although this does not appear to be flow-limiting. At this  point, medical therapy seems to be the best option.    2. Jan 2015 LE Angiogram Findings:  Aortogram: No significant suprarenal aortic stenosis is identified. There is no evidence of renal artery stenosis. The infrarenal abdominal aorta is widely patent. The stent within the left common iliac artery is widely patent. The left external iliac artery is widely patent. The right iliac system is widely patent.  Left Lower Extremity: The left common femoral and profunda femoral arteries are widely patent. The left superficial femoral artery is patent down to the adductor canal where it occludes. There is a bypass graft originating from the distal superficial  femoral artery. The proximal anastomosis is widely patent. The vein bypass graft is widely patent. The distal anastomosis shows mild  narrowing and just beyond the anastomosis is a high-grade stenosis of approximately 80%. The posterior tibial artery isn't patent down across the ankle.  Intervention: After the above images were acquired, the decision was made to proceed with intervention. Over an 035 wire, a 6 French sheath was advanced into the left superficial femoral artery. The patient was fully heparinized. A [place] core wire was easily advanced across the distal anastomosis. I selected a 3 x 20 Fox SV balloon and perform primary balloon angioplasty of the distal anastomosis and posterior tibial artery. The balloon was taken 14 atmospheres for 2 minutes. Completion angiography revealed resolution of the stenosis. At this point catheters and wires were removed. The sheath was withdrawn to the right external iliac artery. The patient was taken to the holding area for sheath pull once his coagulation profile corrects.  Impression:  #1 high-grade stenosis at the distal anastomosis and in the native posterior tibial artery just beyond the distal anastomosis. This was successfully dilated using a 3 x 20 balloon with excellent results.  Lab Results  Basic Metabolic Panel:  Recent Labs Lab 09/02/15 1446 09/02/15 1729 09/03/15 0657 09/04/15 0450  NA 140 139 140 142  K 4.7 4.5 4.3 4.1  CL 110 115* 113* 118*  CO2 18*  --  17* 16*  GLUCOSE 113* 92 85 92  BUN 45* 44* 41* 39*  CREATININE 3.50* 3.30* 2.64* 2.10*  CALCIUM 6.5*  --  5.9* 7.1*    Liver Function Tests:  Recent Labs Lab 09/02/15 1446 09/03/15 0657 09/03/15 1343  AST 100*  --  95*  ALT 34  --  36  ALKPHOS 116  --  133*  BILITOT 0.7  --  1.1  PROT 5.9*  --  6.0*  ALBUMIN 2.5* 2.3* 2.5*    CBC:  Recent Labs Lab 09/02/15 1446  09/02/15 2236 09/03/15 0657 09/03/15 1343 09/03/15 2243 09/04/15 0450  WBC 15.5*  --   --   15.6*  --   --  12.6*  NEUTROABS 13.4*  --   --   --   --   --   --   HGB 7.6*  < > 7.9* 9.6* 10.5* 9.5* 10.0*  HCT 24.3*  < > 24.9* 29.8* 31.9* 29.0* 30.9*  MCV 93.1  --   --  88.2  --   --  87.8  PLT 131*  --   --  89*  --   --  95*  < > = values in this interval not displayed.  Cardiac Enzymes:  Recent Labs Lab 09/02/15 2236 09/03/15 0657 09/03/15 1343 09/04/15 0450  TROPONINI 9.24* 10.29* 8.76* 5.48*    Radiology: Ct Abdomen Pelvis Wo Contrast  09/02/2015  CLINICAL DATA:  History of recent falls while on blood thinners EXAM: CT ABDOMEN AND PELVIS WITHOUT CONTRAST TECHNIQUE: Multidetector CT imaging of the abdomen and pelvis was performed following the standard protocol without IV contrast. COMPARISON:  None. FINDINGS: Lung bases demonstrate some chronic fibrotic changes. No focal infiltrate or sizable effusion is seen. The liver, spleen, adrenal glands and pancreas are all normal in their CT appearance. The gallbladder is well distended and demonstrates a single dependent gallstone without complicating factors. Kidneys demonstrate bilateral nonobstructing renal stones. The ureters are within normal limits bilaterally. A few scattered hypodensities are noted within the right kidney consistent with cystic change. Aortic iliac calcifications are noted without aneurysmal dilatation. The appendix is within normal limits. Mild diverticular change is noted without diverticulitis. The bladder is well distended.  Postsurgical changes are noted in the lower lumbar spine. No acute bony abnormality is seen. No soft tissue hematoma is seen. IMPRESSION: Cholelithiasis without complicating factors. No acute abnormality seen. Electronically Signed   By: Alcide Clever M.D.   On: 09/02/2015 17:33   Dg Chest 2 View  09/02/2015  CLINICAL DATA:  Fall, left shoulder and elbow pain, weakness EXAM: CHEST  2 VIEW COMPARISON:  07/2015 FINDINGS: Low lung volumes with similar chronic bibasilar interstitial opacities  which correlate with areas of chronic bronchiectasis by comparison chest CT. Heart is enlarged. Prior coronary bypass changes noted. No effusion, superimposed edema or pneumothorax. Trachea midline. IMPRESSION: Cardiomegaly without CHF or pneumonia Chronic interstitial changes and bronchiectasis particularly in the lower lobes. Electronically Signed   By: Judie Petit.  Shick M.D.   On: 09/02/2015 15:05   Ct Head Wo Contrast  09/02/2015  CLINICAL DATA:  Pain following fall 2 days prior EXAM: CT HEAD WITHOUT CONTRAST CT CERVICAL SPINE WITHOUT CONTRAST TECHNIQUE: Multidetector CT imaging of the head and cervical spine was performed following the standard protocol without intravenous contrast. Multiplanar CT image reconstructions of the cervical spine were also generated. COMPARISON:  CT head and CT cervical spine June 24, 2015; brain MRI June 26, 2015 FINDINGS: CT HEAD FINDINGS Mild diffuse atrophy is stable. There is no intracranial mass, hemorrhage, extra-axial fluid collection, or midline shift. There is a focal infarct at the left frontal -parietal lobe which is undergone evolution compared to prior studies. There is evidence of a prior infarct in the mid right occipital lobe region, stable. There is evidence of a prior lacunar type infarct in the superior head of the caudate nucleus on the right. Decreased attenuation in the periphery of the posterior right cerebellum is felt to be due to prior infarct. This finding is somewhat better seen compared to prior CT but was present on MR examination 2 months prior and is unchanged. There is slight small vessel disease in the centra semiovale bilaterally. There is no new gray-white compartment lesion. No acute infarct evident. The bony calvarium appears intact. There is opacification of multiple mastoid air cells on the left, a finding noted previously. Right-sided mastoid air cells are clear. There is an air-fluid level in the left maxillary antrum. There is opacification  in several ethmoid air cells bilaterally. No intraorbital lesions are identified. CT CERVICAL SPINE FINDINGS There is no demonstrable fracture. Slight retrolisthesis of C3 on C4 is stable. There is slight retrolisthesis of C6 on C7, stable. These changes are consistent with underlying spondylosis. There is no new spondylolisthesis. Prevertebral soft tissues and predental space regions are normal. There is marked disc space narrowing at C3-4, C5-6, C6-7, and C7-T1, stable. There is facet hypertrophy at most levels bilaterally. There is exit foraminal narrowing at multiple levels bilaterally, stable. There is broad-based disc protrusion at C3-4, exacerbated by the spondylolisthesis. This finding again leads to mild central canal stenosis at C3-4. There is marked exit foraminal narrowing on the right at C6-7 and C7-T1, stable. There is also fairly significant exit foraminal narrowing at C6-7 on the left. There is calcification in each carotid artery. There is apical lung scarring bilaterally. IMPRESSION: CT head: Atrophy with prior infarcts, either stable or having undergone evolution compared to prior studies. No acute infarct evident. No mass, hemorrhage, or extra-axial fluid collection. Chronic left-sided mastoid disease. There is evidence of acute sinusitis with air-fluid level in left maxillary antrum. Opacification is also noted in several ethmoid air cells bilaterally. CT cervical spine: No  demonstrable fracture. Areas of mild spondylolisthesis are felt to be due to underlying spondylosis is stable. 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 There is a degree of spinal stenosis at C3-4 due to disc protrusion exacerbated by spondylolisthesis. There is calcification in both carotid arteries. Electronically Signed   By: Bretta Bang III M.D.   On: 09/02/2015 15:44   Ct Cervical Spine Wo Contrast  09/02/2015  CLINICAL DATA:  Pain following fall 2 days prior EXAM: CT HEAD WITHOUT CONTRAST  CT CERVICAL SPINE WITHOUT CONTRAST TECHNIQUE: Multidetector CT imaging of the head and cervical spine was performed following the standard protocol without intravenous contrast. Multiplanar CT image reconstructions of the cervical spine were also generated. COMPARISON:  CT head and CT cervical spine June 24, 2015; brain MRI June 26, 2015 FINDINGS: CT HEAD FINDINGS Mild diffuse atrophy is stable. There is no intracranial mass, hemorrhage, extra-axial fluid collection, or midline shift. There is a focal infarct at the left frontal -parietal lobe which is undergone evolution compared to prior studies. There is evidence of a prior infarct in the mid right occipital lobe region, stable. There is evidence of a prior lacunar type infarct in the superior head of the caudate nucleus on the right. Decreased attenuation in the periphery of the posterior right cerebellum is felt to be due to prior infarct. This finding is somewhat better seen compared to prior CT but was present on MR examination 2 months prior and is unchanged. There is slight small vessel disease in the centra semiovale bilaterally. There is no new gray-white compartment lesion. No acute infarct evident. The bony calvarium appears intact. There is opacification of multiple mastoid air cells on the left, a finding noted previously. Right-sided mastoid air cells are clear. There is an air-fluid level in the left maxillary antrum. There is opacification in several ethmoid air cells bilaterally. No intraorbital lesions are identified. CT CERVICAL SPINE FINDINGS There is no demonstrable fracture. Slight retrolisthesis of C3 on C4 is stable. There is slight retrolisthesis of C6 on C7, stable. These changes are consistent with underlying spondylosis. There is no new spondylolisthesis. Prevertebral soft tissues and predental space regions are normal. There is marked disc space narrowing at C3-4, C5-6, C6-7, and C7-T1, stable. There is facet hypertrophy at most  levels bilaterally. There is exit foraminal narrowing at multiple levels bilaterally, stable. There is broad-based disc protrusion at C3-4, exacerbated by the spondylolisthesis. This finding again leads to mild central canal stenosis at C3-4. There is marked exit foraminal narrowing on the right at C6-7 and C7-T1, stable. There is also fairly significant exit foraminal narrowing at C6-7 on the left. There is calcification in each carotid artery. There is apical lung scarring bilaterally. IMPRESSION: CT head: Atrophy with prior infarcts, either stable or having undergone evolution compared to prior studies. No acute infarct evident. No mass, hemorrhage, or extra-axial fluid collection. Chronic left-sided mastoid disease. There is evidence of acute sinusitis with air-fluid level in left maxillary antrum. Opacification is also noted in several ethmoid air cells bilaterally. CT cervical spine: No demonstrable fracture. Areas of mild spondylolisthesis are felt to be due to underlying spondylosis is stable. 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 There is a degree of spinal stenosis at C3-4 due to disc protrusion exacerbated by spondylolisthesis. There is calcification in both carotid arteries. Electronically Signed   By: Bretta Bang III M.D.   On: 09/02/2015 15:44   Ct Pelvis Wo Contrast  09/03/2015  CLINICAL DATA:  Bilateral  hip pain and recent hemoglobin dropped on anticoagulation, possible hematoma EXAM: CT PELVIS WITHOUT CONTRAST TECHNIQUE: Multidetector CT imaging of the pelvis was performed following the standard protocol without intravenous contrast. COMPARISON:  09/02/2015 FINDINGS: Postsurgical changes are noted in the lower lumbar spine stable from the prior exam. No acute bony abnormality is noted. The bladder is decompressed by Foley catheter. No sizable hematoma is identified to correspond with the patient's given clinical history. No pelvic mass lesion is seen. A minimal  amount of free pelvic fluid is seen, new from the prior exam IMPRESSION: No evidence of soft tissue hematoma. Minimal free pelvic fluid is seen which was not present on the prior exam. The remainder of the exam is stable from the previous day. Electronically Signed   By: Alcide Clever M.D.   On: 09/03/2015 16:21     ECG: SR with PAC's and evidence of T-wave inversion in the inferior leads.    Impression and Recommendations  1. NSTEMI: Multiple etiologies. Two separate falls, anemia, renal failure, and known history of CAD. He has been on DAPT with ASA and Plavix which is on hold. Troponin is trending down this am to 5.48. He is currently without chest pain, or heart burn. Would continue atenolol 25 mg BID, atorvastatin 80 mg daily, and nitrates. Would restart ASA,  as he has had no evidence of bleeding and consider restarting Plavix as well. No plans for cardiac cath currently with renal status, although improving. He is scheduled to have echo today. This will help with management.   2. CAD: Hx of CABG in 1998 with repeat cardiac cath in 2009 revealing patent grafts. Agree with repeat echo for changes in LV fx. Would restart DAPT. But will only start ASA at this time. Discuss further with Dr. Wyline Mood before starting Plavix.   3. Renal Failure: Non-obstructing stones are noted bilaterally. He has not been eating well due to stomach pain. GI working him up. Negative hemoccult. Creatinine in improving from 3.50 to 2.10 this am. Review of previous creatinine from Feb/March of this year demonstrated creatinine of 1.05-1.10, beginning to trend upward in July and Oct. To 1.46. He had been on lisinopril 20 mg daily, but home medicine list has him on both lisinopril and losartan. Both are on hold currently.   4. Anemia: Hgb initially 7.6 with thrombocytopenia;  Improved with PRBC X 2 to 10.9 and 30.9. Thrombocytopenia is worsening however with platelets of 95. Holding off on Plavix for now.      Signed: Bettey Mare. Lawrence NP [facility]  09/04/2015, 7:39 AM Co-Sign [name]  Patient seen and discussed with NP Lyman Bishop, I agree with her documentation above. 69 yo male with multiple chronic medical comorbidities including RA with recent concern for RA related interstitial lung disease, CAD with prior CABG with last cath 2009 showing patent LIMA-LAD and SVG-OM but occluded SVG-RCA and occluded LAD, LCX, and RCA. He also has history of PAD with prior bypass, HTN, HL, recent CVA in 06/2015, diastolic HF, admitted with weakness and falls. He reports abdominal pain and poor oral intake for the last several days. No chest pain, no SOB   Admit labs: C5 3.5 (up from 1.5 2 months ago), K 4.7, Hgb 7.6(down from 10.2), WBC 15.5, Plt 131, ferritin 680, Trop 10.3 peak now trending down to 5.48.  CXR cardiomegaly, no CHF CT A/P no acute process 08/2015 echo: LVEF 55-60%, mod RV enlargement EKG: SR, inferior and anteroseptal Q waves old findings   Anemia, he did  have some transient hypotension as well on admit day with SBP in low 80s. S/p transfusion, Hgb up to 10, will follow trends. AKI improving with IVF with Cr down to 2.10. EKG without acute changes, echo with stable LVEF without significant WMAs. He has not had any chest pain. Given renal function and anemia, combine with absence of chest pain and no hemodynamic instability, risk vs benefit ratio is in favor of medical management for his NSTEMI. I suspect the primary issues severe increase in demand in setting of chronic obstructive CAD as opposed to acute obstructive disease.  Medical therapy with ASA, atenolol, atorva, imdur. No ACE-I given AKI, if Hgb remains stable can consider restarting plavix. Given downtrending troponin and presentation of anemia would not start heparin gtt. I would recommend proceeding with EGD given abdominal pain and anemia.  Dominga Ferry [name]

## 2015-09-04 NOTE — Progress Notes (Signed)
Subjective: Continues to complain of abdominal discomfort, mild nausea. NO overt GI bleeding.    Objective: Vital signs in last 24 hours: Temp:  [97.6 F (36.4 C)-98.3 F (36.8 C)] 98.2 F (36.8 C) (12/12 0400) Pulse Rate:  [54-73] 61 (12/12 0600) Resp:  [13-27] 17 (12/12 0600) BP: (137-193)/(72-131) 193/93 mmHg (12/12 0600) SpO2:  [93 %-100 %] 97 % (12/12 0600) Weight:  [135 lb 2.3 oz (61.3 kg)] 135 lb 2.3 oz (61.3 kg) (12/12 0500) Last BM Date: 09/02/15 General:   Alert and oriented, appears uncomfortable Head:  Normocephalic and atraumatic. Abdomen:  Bowel sounds present, soft, TTP upper abdomen, LUQ, no rebound or guarding Psych:  Alert and cooperative.   Intake/Output from previous day: 12/11 0701 - 12/12 0700 In: 120 [P.O.:120] Out: 1225 [Urine:1225] Intake/Output this shift:    Lab Results:  Recent Labs  09/02/15 1446  09/03/15 0657 09/03/15 1343 09/03/15 2243 09/04/15 0450  WBC 15.5*  --  15.6*  --   --  12.6*  HGB 7.6*  < > 9.6* 10.5* 9.5* 10.0*  HCT 24.3*  < > 29.8* 31.9* 29.0* 30.9*  PLT 131*  --  89*  --   --  95*  < > = values in this interval not displayed. BMET  Recent Labs  09/02/15 1446 09/02/15 1729 09/03/15 0657 09/04/15 0450  NA 140 139 140 142  K 4.7 4.5 4.3 4.1  CL 110 115* 113* 118*  CO2 18*  --  17* 16*  GLUCOSE 113* 92 85 92  BUN 45* 44* 41* 39*  CREATININE 3.50* 3.30* 2.64* 2.10*  CALCIUM 6.5*  --  5.9* 7.1*   LFT  Recent Labs  09/02/15 1446 09/03/15 0657 09/03/15 1343  PROT 5.9*  --  6.0*  ALBUMIN 2.5* 2.3* 2.5*  AST 100*  --  95*  ALT 34  --  36  ALKPHOS 116  --  133*  BILITOT 0.7  --  1.1  BILIDIR  --   --  0.7*  IBILI  --   --  0.4   PT/INR  Recent Labs  09/02/15 1609  LABPROT 14.4  INR 1.10     Studies/Results: Ct Abdomen Pelvis Wo Contrast  09/02/2015  CLINICAL DATA:  History of recent falls while on blood thinners EXAM: CT ABDOMEN AND PELVIS WITHOUT CONTRAST TECHNIQUE: Multidetector CT  imaging of the abdomen and pelvis was performed following the standard protocol without IV contrast. COMPARISON:  None. FINDINGS: Lung bases demonstrate some chronic fibrotic changes. No focal infiltrate or sizable effusion is seen. The liver, spleen, adrenal glands and pancreas are all normal in their CT appearance. The gallbladder is well distended and demonstrates a single dependent gallstone without complicating factors. Kidneys demonstrate bilateral nonobstructing renal stones. The ureters are within normal limits bilaterally. A few scattered hypodensities are noted within the right kidney consistent with cystic change. Aortic iliac calcifications are noted without aneurysmal dilatation. The appendix is within normal limits. Mild diverticular change is noted without diverticulitis. The bladder is well distended. Postsurgical changes are noted in the lower lumbar spine. No acute bony abnormality is seen. No soft tissue hematoma is seen. IMPRESSION: Cholelithiasis without complicating factors. No acute abnormality seen. Electronically Signed   By: Alcide Clever M.D.   On: 09/02/2015 17:33   Dg Chest 2 View  09/02/2015  CLINICAL DATA:  Fall, left shoulder and elbow pain, weakness EXAM: CHEST  2 VIEW COMPARISON:  07/2015 FINDINGS: Low lung volumes with similar chronic bibasilar interstitial opacities  which correlate with areas of chronic bronchiectasis by comparison chest CT. Heart is enlarged. Prior coronary bypass changes noted. No effusion, superimposed edema or pneumothorax. Trachea midline. IMPRESSION: Cardiomegaly without CHF or pneumonia Chronic interstitial changes and bronchiectasis particularly in the lower lobes. Electronically Signed   By: Judie Petit.  Shick M.D.   On: 09/02/2015 15:05   Ct Head Wo Contrast  09/02/2015  CLINICAL DATA:  Pain following fall 2 days prior EXAM: CT HEAD WITHOUT CONTRAST CT CERVICAL SPINE WITHOUT CONTRAST TECHNIQUE: Multidetector CT imaging of the head and cervical spine was  performed following the standard protocol without intravenous contrast. Multiplanar CT image reconstructions of the cervical spine were also generated. COMPARISON:  CT head and CT cervical spine June 24, 2015; brain MRI June 26, 2015 FINDINGS: CT HEAD FINDINGS Mild diffuse atrophy is stable. There is no intracranial mass, hemorrhage, extra-axial fluid collection, or midline shift. There is a focal infarct at the left frontal -parietal lobe which is undergone evolution compared to prior studies. There is evidence of a prior infarct in the mid right occipital lobe region, stable. There is evidence of a prior lacunar type infarct in the superior head of the caudate nucleus on the right. Decreased attenuation in the periphery of the posterior right cerebellum is felt to be due to prior infarct. This finding is somewhat better seen compared to prior CT but was present on MR examination 2 months prior and is unchanged. There is slight small vessel disease in the centra semiovale bilaterally. There is no new gray-white compartment lesion. No acute infarct evident. The bony calvarium appears intact. There is opacification of multiple mastoid air cells on the left, a finding noted previously. Right-sided mastoid air cells are clear. There is an air-fluid level in the left maxillary antrum. There is opacification in several ethmoid air cells bilaterally. No intraorbital lesions are identified. CT CERVICAL SPINE FINDINGS There is no demonstrable fracture. Slight retrolisthesis of C3 on C4 is stable. There is slight retrolisthesis of C6 on C7, stable. These changes are consistent with underlying spondylosis. There is no new spondylolisthesis. Prevertebral soft tissues and predental space regions are normal. There is marked disc space narrowing at C3-4, C5-6, C6-7, and C7-T1, stable. There is facet hypertrophy at most levels bilaterally. There is exit foraminal narrowing at multiple levels bilaterally, stable. There is  broad-based disc protrusion at C3-4, exacerbated by the spondylolisthesis. This finding again leads to mild central canal stenosis at C3-4. There is marked exit foraminal narrowing on the right at C6-7 and C7-T1, stable. There is also fairly significant exit foraminal narrowing at C6-7 on the left. There is calcification in each carotid artery. There is apical lung scarring bilaterally. IMPRESSION: CT head: Atrophy with prior infarcts, either stable or having undergone evolution compared to prior studies. No acute infarct evident. No mass, hemorrhage, or extra-axial fluid collection. Chronic left-sided mastoid disease. There is evidence of acute sinusitis with air-fluid level in left maxillary antrum. Opacification is also noted in several ethmoid air cells bilaterally. CT cervical spine: No demonstrable fracture. Areas of mild spondylolisthesis are felt to be due to underlying spondylosis is stable. 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 There is a degree of spinal stenosis at C3-4 due to disc protrusion exacerbated by spondylolisthesis. There is calcification in both carotid arteries. Electronically Signed   By: Bretta Bang III M.D.   On: 09/02/2015 15:44   Ct Cervical Spine Wo Contrast  09/02/2015  CLINICAL DATA:  Pain following fall 2 days prior  EXAM: CT HEAD WITHOUT CONTRAST CT CERVICAL SPINE WITHOUT CONTRAST TECHNIQUE: Multidetector CT imaging of the head and cervical spine was performed following the standard protocol without intravenous contrast. Multiplanar CT image reconstructions of the cervical spine were also generated. COMPARISON:  CT head and CT cervical spine June 24, 2015; brain MRI June 26, 2015 FINDINGS: CT HEAD FINDINGS Mild diffuse atrophy is stable. There is no intracranial mass, hemorrhage, extra-axial fluid collection, or midline shift. There is a focal infarct at the left frontal -parietal lobe which is undergone evolution compared to prior studies. There  is evidence of a prior infarct in the mid right occipital lobe region, stable. There is evidence of a prior lacunar type infarct in the superior head of the caudate nucleus on the right. Decreased attenuation in the periphery of the posterior right cerebellum is felt to be due to prior infarct. This finding is somewhat better seen compared to prior CT but was present on MR examination 2 months prior and is unchanged. There is slight small vessel disease in the centra semiovale bilaterally. There is no new gray-white compartment lesion. No acute infarct evident. The bony calvarium appears intact. There is opacification of multiple mastoid air cells on the left, a finding noted previously. Right-sided mastoid air cells are clear. There is an air-fluid level in the left maxillary antrum. There is opacification in several ethmoid air cells bilaterally. No intraorbital lesions are identified. CT CERVICAL SPINE FINDINGS There is no demonstrable fracture. Slight retrolisthesis of C3 on C4 is stable. There is slight retrolisthesis of C6 on C7, stable. These changes are consistent with underlying spondylosis. There is no new spondylolisthesis. Prevertebral soft tissues and predental space regions are normal. There is marked disc space narrowing at C3-4, C5-6, C6-7, and C7-T1, stable. There is facet hypertrophy at most levels bilaterally. There is exit foraminal narrowing at multiple levels bilaterally, stable. There is broad-based disc protrusion at C3-4, exacerbated by the spondylolisthesis. This finding again leads to mild central canal stenosis at C3-4. There is marked exit foraminal narrowing on the right at C6-7 and C7-T1, stable. There is also fairly significant exit foraminal narrowing at C6-7 on the left. There is calcification in each carotid artery. There is apical lung scarring bilaterally. IMPRESSION: CT head: Atrophy with prior infarcts, either stable or having undergone evolution compared to prior studies. No  acute infarct evident. No mass, hemorrhage, or extra-axial fluid collection. Chronic left-sided mastoid disease. There is evidence of acute sinusitis with air-fluid level in left maxillary antrum. Opacification is also noted in several ethmoid air cells bilaterally. CT cervical spine: No demonstrable fracture. Areas of mild spondylolisthesis are felt to be due to underlying spondylosis is stable. 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 There is a degree of spinal stenosis at C3-4 due to disc protrusion exacerbated by spondylolisthesis. There is calcification in both carotid arteries. Electronically Signed   By: Bretta Bang III M.D.   On: 09/02/2015 15:44   Ct Pelvis Wo Contrast  09/03/2015  CLINICAL DATA:  Bilateral hip pain and recent hemoglobin dropped on anticoagulation, possible hematoma EXAM: CT PELVIS WITHOUT CONTRAST TECHNIQUE: Multidetector CT imaging of the pelvis was performed following the standard protocol without intravenous contrast. COMPARISON:  09/02/2015 FINDINGS: Postsurgical changes are noted in the lower lumbar spine stable from the prior exam. No acute bony abnormality is noted. The bladder is decompressed by Foley catheter. No sizable hematoma is identified to correspond with the patient's given clinical history. No pelvic mass lesion is seen.  A minimal amount of free pelvic fluid is seen, new from the prior exam IMPRESSION: No evidence of soft tissue hematoma. Minimal free pelvic fluid is seen which was not present on the prior exam. The remainder of the exam is stable from the previous day. Electronically Signed   By: Alcide Clever M.D.   On: 09/03/2015 16:21    Assessment: 69 year old male admitted with weakness, recent history of falls, found to have Hgb of 7.6, without any overt GI bleeding. Heme negative in the ED. Improved Hgb with 2 units PRBCs. Associated acute kidney injury and multiple comorbidities, Significantly elevated troponins consistent with  NSTEMI and not a candidate for diagnostic cardiac catheterization in setting of multiple health issues and renal insufficiency. In setting of recent dual antiplatelets therapy (Plavix, ASA), concern for occult GI bleeding. Also on prednisone as outpatient chronically with history of RA. Unable to rule out gastritis, PUD.  Continues to report vague abdominal pain and CT abd/pelvis without contrast noting cholelithiasis but without complicating features. No soft tissue hematoma on CT pelvis. Full cardiology consultation pending. With concern for occult blood loss, would recommend EGD as initial step and consider capsule study in future. Will await cardiology recommendations. Without overt GI bleeding currently and Hgb remains stable.    Isolated elevation of AST on admission: recheck LFTs now. May need US abdomen to rule out any co-existing hepatobiliary component to abdominal pain.   Plan: Recheck HFP now Consider US abdomen Await full cardiology recommendations No urgent endoscopic evaluation until clear from cardiac standpoint Continue to follow H/H May continue clear liquids Remains on protonix drip  Nira Retort, ANP-BC [facility]     LOS: 2 days    09/04/2015, 7:55 AM

## 2015-09-04 NOTE — Progress Notes (Signed)
Patient Demographics  [name], is a 69 y.o. male, DOB - [birth year], MPN:[id number]  Admit date - 09/02/2015   Admitting Physician Houston Siren, MD  Outpatient Primary MD for the patient is Lubertha South, MD  LOS - 2   Chief Complaint  Patient presents with  . Fall       Admission HPI/Brief narrative: 69 y.o. male with recent admission in Oct 2016 for acute CVA, past medical history  of HLD, pulmonary fibrosis thought to be due to methotrexate for his RA, afib, HTN, prior tobacco abuse, RA on chronic steroid with secondary adrenal insufficiency, chronic back pain, presents with generalized weakness, multiple falls, workup significant for anemia 7.6, baseline hemoglobin 10-11,, no melena,received  2 units PRBC, patient on aspirin and Plavix for recent diagnosis of CVA ,as well as elevated troponin, and acute renal failure.  Subjective:   [name] today has, No headache, No chest pain, no shortness of breath, complains of abdominal pain, reported developed yesterday evening.  Assessment & Plan    Principal Problem:   Anemia Active Problems:   Rheumatoid arthritis (HCC)   Diabetes mellitus (HCC)   Chronic lung disease   Chronic pain syndrome   Secondary adrenal insufficiency (HCC)   Stroke (HCC)   AKI (acute kidney injury) (HCC)   Abdominal pain  acute renal failure  - This is due to volume depletion from anemia, and home meds including  lisinopril, Cozaar, Lasix as needed - Improving with IV fluids, will increase IV NS from 75 to 100 mL/h , creatinine is 2.1 today,  continue to hold nephrotoxic medication  NSTEMI. -  significantly elevated troponin, troponins trending down 9.24>10.29>8.76>5.48 , denies any chest pain or shortness of breath, EKG nonacute. - discussed with cardiology, the plan is to continue with conservative management as he is not a candidate for cardiac cath currently given  his acute renal failure, started on aspirin, continue to monitor closely giving his significant anemia, will add Plavix if Hgb remains stable. - 2-D echo pending. - Continue with statin and beta blockers, started on Imdur and hydralazine.   anemia  - patient was significant drop on hemoglobin ,10-11, was 7.6 on presentation, transfused 2 units PRBC with good response,  normal color stool this am , No evidance of melena,  , hemoccult negative (not much stool in Hemoccult card, but was sent regardless to see if it's positive),  GI consult appreciated, patient is on dual antiplatelet therapy at home, most likely upper GI bleed, continue with Protonix drip currently, discussed with GI. - CT Pelvis with no evidence of hematoma, as patient reports couple falls last week with hip pain.  - Monittor H&H closely and transfuse as needed, hemoglobin remained stable.  CVA  - Coumadin aspirin , please see above discussion .  secondary adrenal insufficiency  - treated with stress dose steroids initially, patient is hypertensive, stop stress dose steroid, back on prednisone home dose.  Hypertension - Uncontrolled, already on beta blockers, started on Imdur and hydralazine as well, continue with when necessary hydralazine  Abdominal pain - Will check KUB.  Code Status: Full  Family Communication: Discussed with patient  Disposition Plan:remains in ICU   Procedures  none   Consults   Gastroenterology cardiology  Medications  Scheduled Meds: . aspirin  325 mg Oral Daily  . atenolol  25 mg Oral BID  . atorvastatin  80 mg Oral Daily  . finasteride  5 mg Oral Daily  . folic acid  1 mg Oral Daily  . hydrALAZINE  25 mg Oral 3 times per day  . isosorbide mononitrate  30 mg Oral Daily  . levothyroxine  88 mcg Oral QAC breakfast  . lidocaine  1 patch Transdermal Q24H  . [START ON 09/06/2015] pantoprazole (PROTONIX) IV  40 mg Intravenous Q12H  . sodium chloride  3 mL Intravenous Q12H  .  tamsulosin  0.4 mg Oral Daily  . Vitamin D (Ergocalciferol)  50,000 Units Oral Q Mon   Continuous Infusions: . sodium chloride 100 mL/hr at 09/04/15 0900  . pantoprozole (PROTONIX) infusion 8 mg/hr (09/04/15 0900)   PRN Meds:.ALPRAZolam, hydrALAZINE, HYDROcodone-acetaminophen, morphine injection, ondansetron (ZOFRAN) IV  DVT Prophylaxis  SCDs   Lab Results  Component Value Date   PLT 95* 09/04/2015    Antibiotics    Anti-infectives    None          Objective:   Filed Vitals:   09/04/15 0700 09/04/15 0800 09/04/15 0805 09/04/15 0900  BP: 177/88 194/94  177/93  Pulse: 52 66  64  Temp:   97 F (36.1 C)   TempSrc:   Oral   Resp: Height:      Weight:      SpO2: 95% 96%  97%    Wt Readings from Last 3 Encounters:  09/04/15 61.3 kg (135 lb 2.3 oz)  08/30/15 60.328 kg (133 lb)  08/07/15 61.689 kg (136 lb)     Intake/Output Summary (Last 24 hours) at 09/04/15 0959 Last data filed at 09/04/15 0900  Gross per 24 hour  Intake    790 ml  Output   1375 ml  Net   -585 ml     Physical Exam  Awake Alert, Oriented X 3,  [place].AT,PERRAL Supple Neck,No JVD, No cervical lymphadenopathy appriciated.  Symmetrical Chest wall movement, Good air movement bilaterally, CTAB RRR,No Gallops,Rubs or new Murmurs, No Parasternal Heave +ve B.Sounds, Abd Soft, tenderness and lower abdominal area, No organomegaly appriciated, No rebound - guarding or rigidity. No Cyanosis, Clubbing or edema, No new Rash or bruise     Data Review   Micro Results Recent Results (from the past 240 hour(s))  MRSA PCR Screening     Status: None   Collection Time: 09/03/15  2:30 AM  Result Value Ref Range Status   MRSA by PCR NEGATIVE NEGATIVE Final    Comment:        The GeneXpert MRSA Assay (FDA approved for NASAL specimens only), is one component of a comprehensive MRSA colonization surveillance program. It is not intended to diagnose MRSA infection nor to guide or monitor  treatment for MRSA infections.     Radiology Reports Ct Abdomen Pelvis Wo Contrast  09/02/2015  CLINICAL DATA:  History of recent falls while on blood thinners EXAM: CT ABDOMEN AND PELVIS WITHOUT CONTRAST TECHNIQUE: Multidetector CT imaging of the abdomen and pelvis was performed following the standard protocol without IV contrast. COMPARISON:  None. FINDINGS: Lung bases demonstrate some chronic fibrotic changes. No focal infiltrate or sizable effusion is seen. The liver, spleen, adrenal glands and pancreas are all normal in their CT appearance. The gallbladder is well distended and demonstrates a single dependent gallstone without complicating factors. Kidneys demonstrate bilateral nonobstructing renal stones. The  ureters are within normal limits bilaterally. A few scattered hypodensities are noted within the right kidney consistent with cystic change. Aortic iliac calcifications are noted without aneurysmal dilatation. The appendix is within normal limits. Mild diverticular change is noted without diverticulitis. The bladder is well distended. Postsurgical changes are noted in the lower lumbar spine. No acute bony abnormality is seen. No soft tissue hematoma is seen. IMPRESSION: Cholelithiasis without complicating factors. No acute abnormality seen. Electronically Signed   By: Alcide Clever M.D.   On: 09/02/2015 17:33   Dg Chest 2 View  09/02/2015  CLINICAL DATA:  Fall, left shoulder and elbow pain, weakness EXAM: CHEST  2 VIEW COMPARISON:  07/2015 FINDINGS: Low lung volumes with similar chronic bibasilar interstitial opacities which correlate with areas of chronic bronchiectasis by comparison chest CT. Heart is enlarged. Prior coronary bypass changes noted. No effusion, superimposed edema or pneumothorax. Trachea midline. IMPRESSION: Cardiomegaly without CHF or pneumonia Chronic interstitial changes and bronchiectasis particularly in the lower lobes. Electronically Signed   By: Judie Petit.  Shick M.D.   On:  09/02/2015 15:05   Ct Head Wo Contrast  09/02/2015  CLINICAL DATA:  Pain following fall 2 days prior EXAM: CT HEAD WITHOUT CONTRAST CT CERVICAL SPINE WITHOUT CONTRAST TECHNIQUE: Multidetector CT imaging of the head and cervical spine was performed following the standard protocol without intravenous contrast. Multiplanar CT image reconstructions of the cervical spine were also generated. COMPARISON:  CT head and CT cervical spine June 24, 2015; brain MRI June 26, 2015 FINDINGS: CT HEAD FINDINGS Mild diffuse atrophy is stable. There is no intracranial mass, hemorrhage, extra-axial fluid collection, or midline shift. There is a focal infarct at the left frontal -parietal lobe which is undergone evolution compared to prior studies. There is evidence of a prior infarct in the mid right occipital lobe region, stable. There is evidence of a prior lacunar type infarct in the superior head of the caudate nucleus on the right. Decreased attenuation in the periphery of the posterior right cerebellum is felt to be due to prior infarct. This finding is somewhat better seen compared to prior CT but was present on MR examination 2 months prior and is unchanged. There is slight small vessel disease in the centra semiovale bilaterally. There is no new gray-white compartment lesion. No acute infarct evident. The bony calvarium appears intact. There is opacification of multiple mastoid air cells on the left, a finding noted previously. Right-sided mastoid air cells are clear. There is an air-fluid level in the left maxillary antrum. There is opacification in several ethmoid air cells bilaterally. No intraorbital lesions are identified. CT CERVICAL SPINE FINDINGS There is no demonstrable fracture. Slight retrolisthesis of C3 on C4 is stable. There is slight retrolisthesis of C6 on C7, stable. These changes are consistent with underlying spondylosis. There is no new spondylolisthesis. Prevertebral soft tissues and predental  space regions are normal. There is marked disc space narrowing at C3-4, C5-6, C6-7, and C7-T1, stable. There is facet hypertrophy at most levels bilaterally. There is exit foraminal narrowing at multiple levels bilaterally, stable. There is broad-based disc protrusion at C3-4, exacerbated by the spondylolisthesis. This finding again leads to mild central canal stenosis at C3-4. There is marked exit foraminal narrowing on the right at C6-7 and C7-T1, stable. There is also fairly significant exit foraminal narrowing at C6-7 on the left. There is calcification in each carotid artery. There is apical lung scarring bilaterally. IMPRESSION: CT head: Atrophy with prior infarcts, either stable or having undergone evolution  compared to prior studies. No acute infarct evident. No mass, hemorrhage, or extra-axial fluid collection. Chronic left-sided mastoid disease. There is evidence of acute sinusitis with air-fluid level in left maxillary antrum. Opacification is also noted in several ethmoid air cells bilaterally. CT cervical spine: No demonstrable fracture. Areas of mild spondylolisthesis are felt to be due to underlying spondylosis is stable. 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 There is a degree of spinal stenosis at C3-4 due to disc protrusion exacerbated by spondylolisthesis. There is calcification in both carotid arteries. Electronically Signed   By: Bretta Bang III M.D.   On: 09/02/2015 15:44   Ct Cervical Spine Wo Contrast  09/02/2015  CLINICAL DATA:  Pain following fall 2 days prior EXAM: CT HEAD WITHOUT CONTRAST CT CERVICAL SPINE WITHOUT CONTRAST TECHNIQUE: Multidetector CT imaging of the head and cervical spine was performed following the standard protocol without intravenous contrast. Multiplanar CT image reconstructions of the cervical spine were also generated. COMPARISON:  CT head and CT cervical spine June 24, 2015; brain MRI June 26, 2015 FINDINGS: CT HEAD FINDINGS Mild  diffuse atrophy is stable. There is no intracranial mass, hemorrhage, extra-axial fluid collection, or midline shift. There is a focal infarct at the left frontal -parietal lobe which is undergone evolution compared to prior studies. There is evidence of a prior infarct in the mid right occipital lobe region, stable. There is evidence of a prior lacunar type infarct in the superior head of the caudate nucleus on the right. Decreased attenuation in the periphery of the posterior right cerebellum is felt to be due to prior infarct. This finding is somewhat better seen compared to prior CT but was present on MR examination 2 months prior and is unchanged. There is slight small vessel disease in the centra semiovale bilaterally. There is no new gray-white compartment lesion. No acute infarct evident. The bony calvarium appears intact. There is opacification of multiple mastoid air cells on the left, a finding noted previously. Right-sided mastoid air cells are clear. There is an air-fluid level in the left maxillary antrum. There is opacification in several ethmoid air cells bilaterally. No intraorbital lesions are identified. CT CERVICAL SPINE FINDINGS There is no demonstrable fracture. Slight retrolisthesis of C3 on C4 is stable. There is slight retrolisthesis of C6 on C7, stable. These changes are consistent with underlying spondylosis. There is no new spondylolisthesis. Prevertebral soft tissues and predental space regions are normal. There is marked disc space narrowing at C3-4, C5-6, C6-7, and C7-T1, stable. There is facet hypertrophy at most levels bilaterally. There is exit foraminal narrowing at multiple levels bilaterally, stable. There is broad-based disc protrusion at C3-4, exacerbated by the spondylolisthesis. This finding again leads to mild central canal stenosis at C3-4. There is marked exit foraminal narrowing on the right at C6-7 and C7-T1, stable. There is also fairly significant exit foraminal  narrowing at C6-7 on the left. There is calcification in each carotid artery. There is apical lung scarring bilaterally. IMPRESSION: CT head: Atrophy with prior infarcts, either stable or having undergone evolution compared to prior studies. No acute infarct evident. No mass, hemorrhage, or extra-axial fluid collection. Chronic left-sided mastoid disease. There is evidence of acute sinusitis with air-fluid level in left maxillary antrum. Opacification is also noted in several ethmoid air cells bilaterally. CT cervical spine: No demonstrable fracture. Areas of mild spondylolisthesis are felt to be due to underlying spondylosis is stable. 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 There is a degree of  spinal stenosis at C3-4 due to disc protrusion exacerbated by spondylolisthesis. There is calcification in both carotid arteries. Electronically Signed   By: Bretta Bang III M.D.   On: 09/02/2015 15:44   Ct Pelvis Wo Contrast  09/03/2015  CLINICAL DATA:  Bilateral hip pain and recent hemoglobin dropped on anticoagulation, possible hematoma EXAM: CT PELVIS WITHOUT CONTRAST TECHNIQUE: Multidetector CT imaging of the pelvis was performed following the standard protocol without intravenous contrast. COMPARISON:  09/02/2015 FINDINGS: Postsurgical changes are noted in the lower lumbar spine stable from the prior exam. No acute bony abnormality is noted. The bladder is decompressed by Foley catheter. No sizable hematoma is identified to correspond with the patient's given clinical history. No pelvic mass lesion is seen. A minimal amount of free pelvic fluid is seen, new from the prior exam IMPRESSION: No evidence of soft tissue hematoma. Minimal free pelvic fluid is seen which was not present on the prior exam. The remainder of the exam is stable from the previous day. Electronically Signed   By: Alcide Clever M.D.   On: 09/03/2015 16:21     CBC  Recent Labs Lab 09/02/15 1446  09/02/15 2236  09/03/15 0657 09/03/15 1343 09/03/15 2243 09/04/15 0450  WBC 15.5*  --   --  15.6*  --   --  12.6*  HGB 7.6*  < > 7.9* 9.6* 10.5* 9.5* 10.0*  HCT 24.3*  < > 24.9* 29.8* 31.9* 29.0* 30.9*  PLT 131*  --   --  89*  --   --  95*  MCV 93.1  --   --  88.2  --   --  87.8  MCH 29.1  --   --  28.4  --   --  28.4  MCHC 31.3  --   --  32.2  --   --  32.4  RDW 18.9*  --   --  19.4*  --   --  19.9*  LYMPHSABS 1.3  --   --   --   --   --   --   MONOABS 0.6  --   --   --   --   --   --   EOSABS 0.2  --   --   --   --   --   --   BASOSABS 0.0  --   --   --   --   --   --   < > = values in this interval not displayed.  Chemistries   Recent Labs Lab 09/02/15 1446 09/02/15 1729 09/03/15 0657 09/03/15 1343 09/04/15 0450  NA 140 139 140  --  142  K 4.7 4.5 4.3  --  4.1  CL 110 115* 113*  --  118*  CO2 18*  --  17*  --  16*  GLUCOSE 113* 92 85  --  92  BUN 45* 44* 41*  --  39*  CREATININE 3.50* 3.30* 2.64*  --  2.10*  CALCIUM 6.5*  --  5.9*  --  7.1*  AST 100*  --   --  95* 78*  ALT 34  --   --  36 31  ALKPHOS 116  --   --  133* 109  BILITOT 0.7  --   --  1.1 0.9   ------------------------------------------------------------------------------------------------------------------ estimated creatinine clearance is 28.4 mL/min (by C-G formula based on Cr of 2.1). ------------------------------------------------------------------------------------------------------------------ No results for input(s): HGBA1C in the last 72 hours. ------------------------------------------------------------------------------------------------------------------ No results for input(s): CHOL, HDL,  LDLCALC, TRIG, CHOLHDL, LDLDIRECT in the last 72 hours. ------------------------------------------------------------------------------------------------------------------ No results for input(s): TSH, T4TOTAL, T3FREE, THYROIDAB in the last 72 hours.  Invalid input(s):  FREET3 ------------------------------------------------------------------------------------------------------------------  Recent Labs  09/02/15 2236  FERRITIN 680*  TIBC 179*  IRON 28*    Coagulation profile  Recent Labs Lab 09/02/15 1609  INR 1.10    No results for input(s): DDIMER in the last 72 hours.  Cardiac Enzymes  Recent Labs Lab 09/03/15 0657 09/03/15 1343 09/04/15 0450  TROPONINI 10.29* 8.76* 5.48*   ------------------------------------------------------------------------------------------------------------------ Invalid input(s): POCBNP     Time Spent in minutes   40 minutes   Jaquelyn Sakamoto M.D on 09/04/2015 at 9:59 AM  Between 7am to 7pm - Pager - [phone]  After 7pm go to [email or web address] - password [facility]  [facility]   Office  [phone]

## 2015-09-04 NOTE — Consult Note (Addendum)
WOC wound consult note Reason for Consult: Consult requested for sacrum wound.  Performed via remote camera with assistance from the bedside nurse for assessment and measurements. Pt was noted to have a pressure injury to this site in the ER upon admission. Pt states he has had the wound "for awhile and home health assists with the care, but it has declined recently." Pt is very thin with protruding sacral bone and admits that he spends most of his time sitting up in a recliner at home. Wound type:  Unstageable pressure injury to sacrum Pressure Ulcer POA: Yes Measurement: 2.5X.5cm Wound bed: 100% dark eschar Drainage (amount, consistency, odor) No odor or drainage Periwound: Red, macerated, peeling skin surrounding wound; appearance consistent with moisture associated skin damage. Dressing procedure/placement/frequency: Santyl for enzymatic debridement of nonviable tissue.  Air cushion ordered for when the patient is OOB in the chair for pressure reduction.  Discussed plan of care with patient and he verbalizes understanding. Please re-consult if further assistance is needed.  Thank-you,  Cammie Mcgee MSN, RN, CWOCN, [place], CNS [phone]

## 2015-09-05 DIAGNOSIS — L8990 Pressure ulcer of unspecified site, unspecified stage: Secondary | ICD-10-CM | POA: Insufficient documentation

## 2015-09-05 DIAGNOSIS — N179 Acute kidney failure, unspecified: Secondary | ICD-10-CM | POA: Insufficient documentation

## 2015-09-05 LAB — CBC
HCT: 29.8 % — ABNORMAL LOW (ref 39.0–52.0)
Hemoglobin: 9.6 g/dL — ABNORMAL LOW (ref 13.0–17.0)
MCH: 28.6 pg (ref 26.0–34.0)
MCHC: 32.2 g/dL (ref 30.0–36.0)
MCV: 88.7 fL (ref 78.0–100.0)
Platelets: 92 10*3/uL — ABNORMAL LOW (ref 150–400)
RBC: 3.36 MIL/uL — ABNORMAL LOW (ref 4.22–5.81)
RDW: 19.9 % — ABNORMAL HIGH (ref 11.5–15.5)
WBC: 8.6 10*3/uL (ref 4.0–10.5)

## 2015-09-05 LAB — BASIC METABOLIC PANEL
Anion gap: 5 (ref 5–15)
BUN: 32 mg/dL — AB (ref 6–20)
CHLORIDE: 118 mmol/L — AB (ref 101–111)
CO2: 17 mmol/L — AB (ref 22–32)
CREATININE: 1.52 mg/dL — AB (ref 0.61–1.24)
Calcium: 6.6 mg/dL — ABNORMAL LOW (ref 8.9–10.3)
GFR calc Af Amer: 52 mL/min — ABNORMAL LOW (ref >60)
GFR calc non Af Amer: 45 mL/min — ABNORMAL LOW (ref >60)
Glucose, Bld: 88 mg/dL (ref 65–99)
POTASSIUM: 3.5 mmol/L (ref 3.5–5.1)
SODIUM: 140 mmol/L (ref 135–145)

## 2015-09-05 LAB — CK: CK TOTAL: 294 U/L (ref 49–397)

## 2015-09-05 LAB — URINE CULTURE: Culture: NO GROWTH

## 2015-09-05 LAB — HEMOGLOBIN AND HEMATOCRIT, BLOOD
HEMATOCRIT: 35.1 % — AB (ref 39.0–52.0)
HEMOGLOBIN: 11.3 g/dL — AB (ref 13.0–17.0)

## 2015-09-05 MED ORDER — HYDRALAZINE HCL 25 MG PO TABS
37.5000 mg | ORAL_TABLET | Freq: Three times a day (TID) | ORAL | Status: DC
  Administered 2015-09-05 – 2015-09-07 (×7): 37.5 mg via ORAL
  Filled 2015-09-05 (×7): qty 2

## 2015-09-05 MED ORDER — LIDOCAINE 5 % EX PTCH
MEDICATED_PATCH | CUTANEOUS | Status: AC
  Filled 2015-09-05: qty 1

## 2015-09-05 MED ORDER — PANTOPRAZOLE SODIUM 40 MG PO TBEC
40.0000 mg | DELAYED_RELEASE_TABLET | Freq: Two times a day (BID) | ORAL | Status: DC
  Administered 2015-09-05 – 2015-09-07 (×5): 40 mg via ORAL
  Filled 2015-09-05 (×5): qty 1

## 2015-09-05 MED ORDER — CLOPIDOGREL BISULFATE 75 MG PO TABS
75.0000 mg | ORAL_TABLET | Freq: Every day | ORAL | Status: DC
  Administered 2015-09-05 – 2015-09-07 (×3): 75 mg via ORAL
  Filled 2015-09-05 (×3): qty 1

## 2015-09-05 NOTE — Progress Notes (Signed)
Subjective: Feels much improved. No N/V. No overt GI bleeding. No abdominal pain.   Objective: Vital signs in last 24 hours: Temp:  [96.1 F (35.6 C)-98.5 F (36.9 C)] 97.3 F (36.3 C) (12/13 0756) Pulse Rate:  [50-122] 71 (12/13 0900) Resp:  [13-24] 13 (12/13 0900) BP: (110-181)/(60-99) 147/76 mmHg (12/13 0900) SpO2:  [91 %-97 %] 95 % (12/13 0900) Weight:  [132 lb 4.4 oz (60 kg)] 132 lb 4.4 oz (60 kg) (12/13 0500) Last BM Date: 09/02/15 General:   Alert and oriented, pleasant Abdomen:  Bowel sounds present, soft, non-tender, non-distended. No HSM or hernias noted. No rebound or guarding. No masses appreciated  Neurologic:  Alert and  oriented x4 Psych:  Alert and cooperative. Normal mood and affect.  Intake/Output from previous day: 12/12 0701 - 12/13 0700 In: 3800 [P.O.:800; I.V.:3000] Out: 1025 [Urine:1025] Intake/Output this shift: Total I/O In: 730 [P.O.:480; I.V.:250] Out: -   Lab Results:  Recent Labs  09/03/15 0657  09/04/15 0450 09/04/15 1401 09/05/15 0503  WBC 15.6*  --  12.6*  --  8.6  HGB 9.6*  < > 10.0* 10.8* 9.6*  HCT 29.8*  < > 30.9* 33.4* 29.8*  PLT 89*  --  95*  --  92*  < > = values in this interval not displayed. BMET  Recent Labs  09/03/15 0657 09/04/15 0450 09/05/15 0503  NA 140 142 140  K 4.3 4.1 3.5  CL 113* 118* 118*  CO2 17* 16* 17*  GLUCOSE 85 92 88  BUN 41* 39* 32*  CREATININE 2.64* 2.10* 1.52*  CALCIUM 5.9* 7.1* 6.6*   LFT  Recent Labs  09/02/15 1446 09/03/15 0657 09/03/15 1343 09/04/15 0450  PROT 5.9*  --  6.0* 5.3*  ALBUMIN 2.5* 2.3* 2.5* 2.2*  AST 100*  --  95* 78*  ALT 34  --  36 31  ALKPHOS 116  --  133* 109  BILITOT 0.7  --  1.1 0.9  BILIDIR  --   --  0.7* 0.4  IBILI  --   --  0.4 0.5   PT/INR  Recent Labs  09/02/15 1609  LABPROT 14.4  INR 1.10    Studies/Results: Ct Pelvis Wo Contrast  09/03/2015  CLINICAL DATA:  Bilateral hip pain and recent hemoglobin dropped on anticoagulation, possible  hematoma EXAM: CT PELVIS WITHOUT CONTRAST TECHNIQUE: Multidetector CT imaging of the pelvis was performed following the standard protocol without intravenous contrast. COMPARISON:  09/02/2015 FINDINGS: Postsurgical changes are noted in the lower lumbar spine stable from the prior exam. No acute bony abnormality is noted. The bladder is decompressed by Foley catheter. No sizable hematoma is identified to correspond with the patient's given clinical history. No pelvic mass lesion is seen. A minimal amount of free pelvic fluid is seen, new from the prior exam IMPRESSION: No evidence of soft tissue hematoma. Minimal free pelvic fluid is seen which was not present on the prior exam. The remainder of the exam is stable from the previous day. Electronically Signed   By: Alcide Clever M.D.   On: 09/03/2015 16:21   Dg Abd Portable 1v  09/04/2015  CLINICAL DATA:  Abdominal pain, generalized weakness, multiple falls, recent diagnosis of stroke, hypertension, hyperlipidemia, former smoker, diabetes mellitus, history nephrolithiasis EXAM: PORTABLE ABDOMEN - 1 VIEW COMPARISON:  CT abdomen and pelvis 09/02/2015 FINDINGS: Calcified gallstone RIGHT upper quadrant 13 x 12 mm. Tiny BILATERAL renal calculi. No ureteral calcifications. Scattered atherosclerotic calcifications with stent in a LEFT common iliac  artery. Prior L4-S1 fusion with dextro convex lumbar scoliosis. Normal bowel gas pattern. No bowel dilatation or bowel wall thickening. IMPRESSION: Cholelithiasis. Tiny renal calculi. Normal bowel gas pattern. Electronically Signed   By: Ulyses Southward M.D.   On: 09/04/2015 11:10   US Abdomen Limited Ruq  09/04/2015  CLINICAL DATA:  RIGHT upper quadrant abdominal pain for 1 week EXAM: US ABDOMEN LIMITED - RIGHT UPPER QUADRANT COMPARISON:  CT abdomen 09/02/2015 FINDINGS: Gallbladder: Mobile 18 mm diameter shadowing gallstone within gallbladder. Associated gallbladder wall thickening. No sonographic Murphy sign or definite  pericholecystic fluid. Common bile duct: Diameter: 2 mm diameter , normal Liver: Suboptimally visualized due to bowel gas. No definite RIGHT upper quadrant free fluid. IMPRESSION: Cholelithiasis with mild associated gallbladder wall thickening though no definite sonographic Eulah Pont sign is seen. Unable to exclude early acute cholecystitis with this appearance. Electronically Signed   By: Ulyses Southward M.D.   On: 09/04/2015 16:32    Assessment: 69 year old male admitted with weakness, recent history of falls, found to have Hgb of 7.6, without any overt GI bleeding. Heme negative in the ED. Improved Hgb with 2 units PRBCs. Associated acute kidney injury and multiple comorbidities, Significantly elevated troponins consistent with NSTEMI and not a candidate for diagnostic cardiac catheterization in setting of multiple health issues and renal insufficiency. In setting of recent dual antiplatelets therapy (Plavix, ASA), there was possible concern for occult GI bleeding. Also on prednisone as outpatient chronically with history of RA. Clinically, he remains without overt GI bleeding and is significantly improved from admission. Although ultrasound of abdomen notes cholelithiasis, doubt dealing with cholecystitis as symptomatically does not correlate with this. In absence of overt bleeding and overall stable Hgb, recommend holding off on EGD unless overt bleeding or acute blood loss anemia.    Isolated elevation of AST on admission: improved with otherwise normal LFTs. US abdomen as above.   Thrombocytopenia: without known liver disease. Question multifactorial. Continue to monitor, may need further work-up if persistent.    Plan: Discontinue Protonix infusion and change to oral BID Continue to follow H/H Hold on endoscopic evaluation unless overt GI bleeding Advance to low fat diet Will follow peripherally  Nira Retort, ANP-BC [facility]    LOS: 3 days    09/05/2015, 9:39 AM

## 2015-09-05 NOTE — Progress Notes (Signed)
PT Cancellation Note  Patient Details Name: [name] MRN: [id number] DOB: 12-05-1945   Cancelled Treatment:    Reason Eval/Treat Not Completed: Fatigue/lethargy limiting ability to participate.  Will try to see pt in AM.   Myrlene Broker L  PT 09/05/2015, 2:38 PM [phone]

## 2015-09-05 NOTE — Progress Notes (Signed)
Consulting cardiologist: Dina Rich MD Primary Cardiologist: Dina Rich MD  Cardiology Specific Problem List: 1. NSTEMI 2. CAD  Subjective:    No chest pain, no SOB  Objective:   Temp:  [96.1 F (35.6 C)-98.5 F (36.9 C)] 97.3 F (36.3 C) (12/13 0756) Pulse Rate:  [50-122] 58 (12/13 0600) Resp:  [14-24] 16 (12/13 0600) BP: (110-181)/(60-99) 170/85 mmHg (12/13 0603) SpO2:  [93 %-97 %] 96 % (12/13 0600) Weight:  [132 lb 4.4 oz (60 kg)] 132 lb 4.4 oz (60 kg) (12/13 0500) Last BM Date: 09/02/15  Filed Weights   09/03/15 0515 09/04/15 0500 09/05/15 0500  Weight: 134 lb 4.2 oz (60.9 kg) 135 lb 2.3 oz (61.3 kg) 132 lb 4.4 oz (60 kg)    Intake/Output Summary (Last 24 hours) at 09/05/15 0829 Last data filed at 09/05/15 0600  Gross per 24 hour  Intake   3230 ml  Output    875 ml  Net   2355 ml    Telemetry: SR, short runs of atach  Exam:  General: No acute distress.  HEENT: Conjunctiva and lids normal, oropharynx clear.  Lungs: Clear to auscultation, nonlabored. Mild bibasilar crackles.   Cardiac: No elevated JVP or bruits. IRRR, no gallop or rub.   Abdomen: Normoactive bowel sounds, nontender, nondistended.  Extremities: No pitting edema, distal pulses full.  Neuropsychiatric: Alert and oriented x3, affect appropriate.   Lab Results: Echocardiogram 09/04/2015 Left ventricle: The cavity size was normal. Wall thickness was increased in a pattern of moderate LVH. Systolic function was normal. The estimated ejection fraction was in the range of 55% to 60%. Wall motion was normal; there were no regional wall motion abnormalities. - Aortic valve: Moderately calcified annulus. Trileaflet; mildly thickened leaflets. Valve area (VTI): 2.7 cm^2. Valve area (Vmax): 2.6 cm^2. - Mitral valve: Moderately calcified annulus. Mildly thickened leaflets . There was mild regurgitation. - Left atrium: The atrium was severely dilated. - Right  ventricle: The cavity size was moderately dilated. - Right atrium: The atrium was moderately dilated. - Atrial septum: No defect or patent foramen ovale was identified. - Tricuspid valve: There was mild-moderate regurgitation. - Pulmonary arteries: Systolic pressure was mildly to moderately increased. PA peak pressure: 39 mm Hg (S). - Technically adequate study.  Basic Metabolic Panel:  Recent Labs Lab 09/03/15 0657 09/04/15 0450 09/05/15 0503  NA 140 142 140  K 4.3 4.1 3.5  CL 113* 118* 118*  CO2 17* 16* 17*  GLUCOSE 85 92 88  BUN 41* 39* 32*  CREATININE 2.64* 2.10* 1.52*  CALCIUM 5.9* 7.1* 6.6*    Liver Function Tests:  Recent Labs Lab 09/02/15 1446 09/03/15 0657 09/03/15 1343 09/04/15 0450  AST 100*  --  95* 78*  ALT 34  --  36 31  ALKPHOS 116  --  133* 109  BILITOT 0.7  --  1.1 0.9  PROT 5.9*  --  6.0* 5.3*  ALBUMIN 2.5* 2.3* 2.5* 2.2*    CBC:  Recent Labs Lab 09/03/15 0657  09/04/15 0450 09/04/15 1401 09/05/15 0503  WBC 15.6*  --  12.6*  --  8.6  HGB 9.6*  < > 10.0* 10.8* 9.6*  HCT 29.8*  < > 30.9* 33.4* 29.8*  MCV 88.2  --  87.8  --  88.7  PLT 89*  --  95*  --  92*  < > = values in this interval not displayed.  Cardiac Enzymes:  Recent Labs Lab 09/03/15 [phone] 09/03/15 1343 09/04/15 0450 09/05/15 0503  CKTOTAL  --   --   --  294  TROPONINI 10.29* 8.76* 5.48*  --     Coagulation:  Recent Labs Lab 09/02/15 1609  INR 1.10    Radiology: Ct Pelvis Wo Contrast  09/03/2015  CLINICAL DATA:  Bilateral hip pain and recent hemoglobin dropped on anticoagulation, possible hematoma EXAM: CT PELVIS WITHOUT CONTRAST TECHNIQUE: Multidetector CT imaging of the pelvis was performed following the standard protocol without intravenous contrast. COMPARISON:  09/02/2015 FINDINGS: Postsurgical changes are noted in the lower lumbar spine stable from the prior exam. No acute bony abnormality is noted. The bladder is decompressed by Foley catheter. No sizable  hematoma is identified to correspond with the patient's given clinical history. No pelvic mass lesion is seen. A minimal amount of free pelvic fluid is seen, new from the prior exam IMPRESSION: No evidence of soft tissue hematoma. Minimal free pelvic fluid is seen which was not present on the prior exam. The remainder of the exam is stable from the previous day. Electronically Signed   By: Alcide Clever M.D.   On: 09/03/2015 16:21   Dg Abd Portable 1v  09/04/2015  CLINICAL DATA:  Abdominal pain, generalized weakness, multiple falls, recent diagnosis of stroke, hypertension, hyperlipidemia, former smoker, diabetes mellitus, history nephrolithiasis EXAM: PORTABLE ABDOMEN - 1 VIEW COMPARISON:  CT abdomen and pelvis 09/02/2015 FINDINGS: Calcified gallstone RIGHT upper quadrant 13 x 12 mm. Tiny BILATERAL renal calculi. No ureteral calcifications. Scattered atherosclerotic calcifications with stent in a LEFT common iliac artery. Prior L4-S1 fusion with dextro convex lumbar scoliosis. Normal bowel gas pattern. No bowel dilatation or bowel wall thickening. IMPRESSION: Cholelithiasis. Tiny renal calculi. Normal bowel gas pattern. Electronically Signed   By: Ulyses Southward M.D.   On: 09/04/2015 11:10   US Abdomen Limited Ruq  09/04/2015  CLINICAL DATA:  RIGHT upper quadrant abdominal pain for 1 week EXAM: US ABDOMEN LIMITED - RIGHT UPPER QUADRANT COMPARISON:  CT abdomen 09/02/2015 FINDINGS: Gallbladder: Mobile 18 mm diameter shadowing gallstone within gallbladder. Associated gallbladder wall thickening. No sonographic Murphy sign or definite pericholecystic fluid. Common bile duct: Diameter: 2 mm diameter , normal Liver: Suboptimally visualized due to bowel gas. No definite RIGHT upper quadrant free fluid. IMPRESSION: Cholelithiasis with mild associated gallbladder wall thickening though no definite sonographic Eulah Pont sign is seen. Unable to exclude early acute cholecystitis with this appearance. Electronically Signed    By: Ulyses Southward M.D.   On: 09/04/2015 16:32     Medications:   Scheduled Medications: . aspirin  325 mg Oral Daily  . atenolol  25 mg Oral BID  . atorvastatin  80 mg Oral Daily  . collagenase   Topical Daily  . finasteride  5 mg Oral Daily  . folic acid  1 mg Oral Daily  . hydrALAZINE  25 mg Oral 3 times per day  . isosorbide mononitrate  30 mg Oral Daily  . levothyroxine  88 mcg Oral QAC breakfast  . lidocaine  1 patch Transdermal Q24H  . [START ON 09/06/2015] pantoprazole (PROTONIX) IV  40 mg Intravenous Q12H  . predniSONE  10 mg Oral Q breakfast  . sodium chloride  3 mL Intravenous Q12H  . tamsulosin  0.4 mg Oral Daily  . Vitamin D (Ergocalciferol)  50,000 Units Oral Q Mon    Infusions: . sodium chloride 100 mL/hr at 09/05/15 0600  . pantoprozole (PROTONIX) infusion 8 mg/hr (09/05/15 0600)    PRN Medications: ALPRAZolam, hydrALAZINE, HYDROcodone-acetaminophen, morphine injection, ondansetron (ZOFRAN) IV   Assessment  and Plan:   1. NSTEMI: No further troponin are resulted. They have been trending down. He denies chest pain. Repeat EKG does not show acute ACS. Continue to treat medically with no ischemic testing planned now, but could be considered once other medical issues are stabalized.  2.CAD: Hx of CABG in 1998.Marland KitchenEcho did not show changes  in  LV fx  Have not restarted Plavix.Continue ASA, atenolol and atorvastatin.   3. Anemia: Improved with blood transfusions. No further abdominal pain. GI following.    Bettey Mare. Lawrence NP [facility]  09/05/2015, 8:29 AM   Attending Note  Patient seen and discussed with NP Lyman Bishop, I agree with her documentation above. 69 yo male with multiple chronic medical comorbidities including RA with recent concern for RA related interstitial lung disease, CAD with prior CABG with last cath 2009 showing patent LIMA-LAD and SVG-OM but occluded SVG-RCA and occluded LAD, LCX, and RCA. He also has history of PAD with prior bypass, HTN, HL,  recent CVA in 06/2015, diastolic HF, admitted with weakness and falls. He reports abdominal pain and poor oral intake for the last several days. No chest pain, no SOB  Patient with NSTEMI in setting of significant anemia and transient hypotension. EKG without acute changes, echo with stable LVEF without significant WMAs. He has not had any chest pain. Given renal function and anemia, combine with absence of chest pain and no hemodynamic instability, risk vs benefit ratio is in favor of medical management for his NSTEMI. I suspect the primary issues severe increase in demand in setting of chronic obstructive CAD as opposed to acute obstructive disease. Medical therapy with ASA, atenolol, atorva, imdur. No ACE-I given AKI, if Hgb remains stable can consider restarting plavix. Recommend proceeding with GI workup, from cardiac standpoint he is stable and ok for endoscopy if planned. Cr trending down, would continue to hold ACE/ARB. Somehow both ACE and ARB are on his home med list, this was not the case at our last outpatient appointment, when restarting would only restart ACE-I. Hydralazine is reasonable at this time for bp control, will increase to 37.5mg  tid.   Dominga Ferry MD

## 2015-09-05 NOTE — Progress Notes (Addendum)
Patient Demographics  [name], is a 69 y.o. male, DOB - Mar 04, 1946, ZOX:[id number]  Admit date - 09/02/2015   Admitting Physician Houston Siren, MD  Outpatient Primary MD for the patient is Lubertha South, MD  LOS - 3   Chief Complaint  Patient presents with  . Fall       Admission HPI/Brief narrative: 69 y.o. male with recent admission in Oct 2016 for acute CVA, past medical history  of HLD, pulmonary fibrosis thought to be due to methotrexate for his RA, afib, HTN, prior tobacco abuse, RA on chronic steroid with secondary adrenal insufficiency, chronic back pain, presents with generalized weakness, multiple falls, workup significant for anemia 7.6, baseline hemoglobin 10-11, no melena,received  2 units PRBC, patient on aspirin and Plavix for recent diagnosis of CVA ,as well as elevated troponin, and acute renal failure, hemoglobin stable after 2 units transfusion,We'll continue with conservative management for NSTEMI, resumed on aspirin and Plavix.  Subjective:   [name] today has, No headache, No chest pain, no shortness of breath, abdominal pain  Resolved, multiple bowel movements yesterday after receiving magnesium citrate , normal color, no evidence of GI bleed .  Principal Problem:   Anemia Active Problems:   Rheumatoid arthritis (HCC)   Diabetes mellitus (HCC)   Chronic lung disease   Chronic pain syndrome   Secondary adrenal insufficiency (HCC)   Stroke (HCC)   AKI (acute kidney injury) (HCC)   Abdominal pain   NSTEMI (non-ST elevated myocardial infarction) (HCC)   Pressure ulcer  acute renal failure  - This is due to volume depletion from anemia, and home meds including  lisinopril, Cozaar, Lasix as needed - Improving with IV fluids,  creatinine is 1.5 today,  continue to hold nephrotoxic medication  NSTEMI. -  significantly elevated troponin, troponins trending down  9.24>10.29>8.76>5.48 , denies any chest pain or shortness of breath, EKG nonacute. - Heart surgery consult appreciated, the plan is to continue with conservative management as he is not a candidate for cardiac cath currently given his acute renal failure, started on aspirin 12/12, and Plavix  12/13. -  resume her Plavix today, monitor CBC closely . - 2-D echo echo with stable LVEF without significant WMAs - Continue with statin and beta blockers, started on Imdur and hydralazine.   anemia  - patient was significant drop on hemoglobin ,10-11, was 7.6 on presentation, transfused 2 units PRBC with good response,  normal color stool this am , No evidance of melena,  , hemoccult negative (not much stool in Hemoccult card, but was sent regardless to see if it's positive),  GI consult appreciated, patient is on dual antiplatelet therapy at home, most likely upper GI bleed, continue with PPI . - CT Pelvis with no evidence of hematoma, as patient reports couple falls last week with hip pain.  - continue to monitor H&H closely and transfuse as needed,  as patient is resumed on aspirin and Plavix   CVA  - Continue aspirin , resumed on plavix today.please see above discussion .  secondary adrenal insufficiency  - treated with stress dose steroids initially, patient is hypertensive, stop stress dose steroid, back on prednisone home dose.  Hypertension - better controlled, already on beta blockers, started on Imdur  and hydralazine as well, continue with when necessary hydralazine  Abdominal pain - resolved,  KUB finding of cholelithiasis , Ultrasound obtained as well, evidence of cholelithiasis, no clinical evidence of cholecystitis. - Abdominal pain resolved  Pressure ulcer - Continue with wound care  Code Status: Full  Family Communication: Discussed with patient  Disposition Plan:remains in ICU   Procedures  none   Consults   Gastroenterology cardiology    Medications  Scheduled  Meds: . aspirin  325 mg Oral Daily  . atenolol  25 mg Oral BID  . atorvastatin  80 mg Oral Daily  . clopidogrel  75 mg Oral Daily  . collagenase   Topical Daily  . finasteride  5 mg Oral Daily  . folic acid  1 mg Oral Daily  . hydrALAZINE  37.5 mg Oral 3 times per day  . isosorbide mononitrate  30 mg Oral Daily  . levothyroxine  88 mcg Oral QAC breakfast  . lidocaine  1 patch Transdermal Q24H  . pantoprazole  40 mg Oral BID AC  . predniSONE  10 mg Oral Q breakfast  . sodium chloride  3 mL Intravenous Q12H  . tamsulosin  0.4 mg Oral Daily  . Vitamin D (Ergocalciferol)  50,000 Units Oral Q Mon   Continuous Infusions: . sodium chloride 100 mL/hr at 09/05/15 1200   PRN Meds:.ALPRAZolam, hydrALAZINE, HYDROcodone-acetaminophen, morphine injection, ondansetron (ZOFRAN) IV  DVT Prophylaxis  SCDs   Lab Results  Component Value Date   PLT 92* 09/05/2015    Antibiotics    Anti-infectives    None          Objective:   Filed Vitals:   09/05/15 0900 09/05/15 1000 09/05/15 1100 09/05/15 1200  BP: 147/76 152/78 140/66 140/71  Pulse: 71 57 58 63  Temp:      TempSrc:      Resp: 13 14 17 19   Height:      Weight:      SpO2: 95% 97% 96% 96%    Wt Readings from Last 3 Encounters:  09/05/15 60 kg (132 lb 4.4 oz)  08/30/15 60.328 kg (133 lb)  08/07/15 61.689 kg (136 lb)     Intake/Output Summary (Last 24 hours) at 09/05/15 1321 Last data filed at 09/05/15 1200  Gross per 24 hour  Intake   4120 ml  Output    875 ml  Net   3245 ml     Physical Exam  Awake Alert, Oriented X 3,  [place].AT,PERRAL Supple Neck,No JVD, No cervical lymphadenopathy appriciated.  Symmetrical Chest wall movement, Good air movement bilaterally, CTAB RRR,No Gallops,Rubs or new Murmurs, No Parasternal Heave +ve B.Sounds, Abd Soft, tenderness and lower abdominal area, No organomegaly appriciated, No rebound - guarding or rigidity. No Cyanosis, Clubbing or edema, No new Rash or bruise     Data  Review   Micro Results Recent Results (from the past 240 hour(s))  MRSA PCR Screening     Status: None   Collection Time: 09/03/15  2:30 AM  Result Value Ref Range Status   MRSA by PCR NEGATIVE NEGATIVE Final    Comment:        The GeneXpert MRSA Assay (FDA approved for NASAL specimens only), is one component of a comprehensive MRSA colonization surveillance program. It is not intended to diagnose MRSA infection nor to guide or monitor treatment for MRSA infections.   Urine culture     Status: None   Collection Time: 09/04/15  9:15 AM  Result Value Ref  Range Status   Specimen Description URINE, CATHETERIZED  Final   Special Requests NONE  Final   Culture   Final    NO GROWTH 1 DAY Performed at [facility]    Report Status 09/05/2015 FINAL  Final    Radiology Reports Ct Abdomen Pelvis Wo Contrast  09/02/2015  CLINICAL DATA:  History of recent falls while on blood thinners EXAM: CT ABDOMEN AND PELVIS WITHOUT CONTRAST TECHNIQUE: Multidetector CT imaging of the abdomen and pelvis was performed following the standard protocol without IV contrast. COMPARISON:  None. FINDINGS: Lung bases demonstrate some chronic fibrotic changes. No focal infiltrate or sizable effusion is seen. The liver, spleen, adrenal glands and pancreas are all normal in their CT appearance. The gallbladder is well distended and demonstrates a single dependent gallstone without complicating factors. Kidneys demonstrate bilateral nonobstructing renal stones. The ureters are within normal limits bilaterally. A few scattered hypodensities are noted within the right kidney consistent with cystic change. Aortic iliac calcifications are noted without aneurysmal dilatation. The appendix is within normal limits. Mild diverticular change is noted without diverticulitis. The bladder is well distended. Postsurgical changes are noted in the lower lumbar spine. No acute bony abnormality is seen. No soft tissue hematoma is  seen. IMPRESSION: Cholelithiasis without complicating factors. No acute abnormality seen. Electronically Signed   By: Alcide Clever M.D.   On: 09/02/2015 17:33   Dg Chest 2 View  09/02/2015  CLINICAL DATA:  Fall, left shoulder and elbow pain, weakness EXAM: CHEST  2 VIEW COMPARISON:  07/2015 FINDINGS: Low lung volumes with similar chronic bibasilar interstitial opacities which correlate with areas of chronic bronchiectasis by comparison chest CT. Heart is enlarged. Prior coronary bypass changes noted. No effusion, superimposed edema or pneumothorax. Trachea midline. IMPRESSION: Cardiomegaly without CHF or pneumonia Chronic interstitial changes and bronchiectasis particularly in the lower lobes. Electronically Signed   By: Judie Petit.  Shick M.D.   On: 09/02/2015 15:05   Ct Head Wo Contrast  09/02/2015  CLINICAL DATA:  Pain following fall 2 days prior EXAM: CT HEAD WITHOUT CONTRAST CT CERVICAL SPINE WITHOUT CONTRAST TECHNIQUE: Multidetector CT imaging of the head and cervical spine was performed following the standard protocol without intravenous contrast. Multiplanar CT image reconstructions of the cervical spine were also generated. COMPARISON:  CT head and CT cervical spine June 24, 2015; brain MRI June 26, 2015 FINDINGS: CT HEAD FINDINGS Mild diffuse atrophy is stable. There is no intracranial mass, hemorrhage, extra-axial fluid collection, or midline shift. There is a focal infarct at the left frontal -parietal lobe which is undergone evolution compared to prior studies. There is evidence of a prior infarct in the mid right occipital lobe region, stable. There is evidence of a prior lacunar type infarct in the superior head of the caudate nucleus on the right. Decreased attenuation in the periphery of the posterior right cerebellum is felt to be due to prior infarct. This finding is somewhat better seen compared to prior CT but was present on MR examination 2 months prior and is unchanged. There is slight  small vessel disease in the centra semiovale bilaterally. There is no new gray-white compartment lesion. No acute infarct evident. The bony calvarium appears intact. There is opacification of multiple mastoid air cells on the left, a finding noted previously. Right-sided mastoid air cells are clear. There is an air-fluid level in the left maxillary antrum. There is opacification in several ethmoid air cells bilaterally. No intraorbital lesions are identified. CT CERVICAL SPINE FINDINGS There is  no demonstrable fracture. Slight retrolisthesis of C3 on C4 is stable. There is slight retrolisthesis of C6 on C7, stable. These changes are consistent with underlying spondylosis. There is no new spondylolisthesis. Prevertebral soft tissues and predental space regions are normal. There is marked disc space narrowing at C3-4, C5-6, C6-7, and C7-T1, stable. There is facet hypertrophy at most levels bilaterally. There is exit foraminal narrowing at multiple levels bilaterally, stable. There is broad-based disc protrusion at C3-4, exacerbated by the spondylolisthesis. This finding again leads to mild central canal stenosis at C3-4. There is marked exit foraminal narrowing on the right at C6-7 and C7-T1, stable. There is also fairly significant exit foraminal narrowing at C6-7 on the left. There is calcification in each carotid artery. There is apical lung scarring bilaterally. IMPRESSION: CT head: Atrophy with prior infarcts, either stable or having undergone evolution compared to prior studies. No acute infarct evident. No mass, hemorrhage, or extra-axial fluid collection. Chronic left-sided mastoid disease. There is evidence of acute sinusitis with air-fluid level in left maxillary antrum. Opacification is also noted in several ethmoid air cells bilaterally. CT cervical spine: No demonstrable fracture. Areas of mild spondylolisthesis are felt to be due to underlying spondylosis is stable. 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 There is a degree of spinal stenosis at C3-4 due to disc protrusion exacerbated by spondylolisthesis. There is calcification in both carotid arteries. Electronically Signed   By: Bretta Bang III M.D.   On: 09/02/2015 15:44   Ct Cervical Spine Wo Contrast  09/02/2015  CLINICAL DATA:  Pain following fall 2 days prior EXAM: CT HEAD WITHOUT CONTRAST CT CERVICAL SPINE WITHOUT CONTRAST TECHNIQUE: Multidetector CT imaging of the head and cervical spine was performed following the standard protocol without intravenous contrast. Multiplanar CT image reconstructions of the cervical spine were also generated. COMPARISON:  CT head and CT cervical spine June 24, 2015; brain MRI June 26, 2015 FINDINGS: CT HEAD FINDINGS Mild diffuse atrophy is stable. There is no intracranial mass, hemorrhage, extra-axial fluid collection, or midline shift. There is a focal infarct at the left frontal -parietal lobe which is undergone evolution compared to prior studies. There is evidence of a prior infarct in the mid right occipital lobe region, stable. There is evidence of a prior lacunar type infarct in the superior head of the caudate nucleus on the right. Decreased attenuation in the periphery of the posterior right cerebellum is felt to be due to prior infarct. This finding is somewhat better seen compared to prior CT but was present on MR examination 2 months prior and is unchanged. There is slight small vessel disease in the centra semiovale bilaterally. There is no new gray-white compartment lesion. No acute infarct evident. The bony calvarium appears intact. There is opacification of multiple mastoid air cells on the left, a finding noted previously. Right-sided mastoid air cells are clear. There is an air-fluid level in the left maxillary antrum. There is opacification in several ethmoid air cells bilaterally. No intraorbital lesions are identified. CT CERVICAL SPINE FINDINGS There is no  demonstrable fracture. Slight retrolisthesis of C3 on C4 is stable. There is slight retrolisthesis of C6 on C7, stable. These changes are consistent with underlying spondylosis. There is no new spondylolisthesis. Prevertebral soft tissues and predental space regions are normal. There is marked disc space narrowing at C3-4, C5-6, C6-7, and C7-T1, stable. There is facet hypertrophy at most levels bilaterally. There is exit foraminal narrowing at multiple levels bilaterally, stable. There is broad-based  disc protrusion at C3-4, exacerbated by the spondylolisthesis. This finding again leads to mild central canal stenosis at C3-4. There is marked exit foraminal narrowing on the right at C6-7 and C7-T1, stable. There is also fairly significant exit foraminal narrowing at C6-7 on the left. There is calcification in each carotid artery. There is apical lung scarring bilaterally. IMPRESSION: CT head: Atrophy with prior infarcts, either stable or having undergone evolution compared to prior studies. No acute infarct evident. No mass, hemorrhage, or extra-axial fluid collection. Chronic left-sided mastoid disease. There is evidence of acute sinusitis with air-fluid level in left maxillary antrum. Opacification is also noted in several ethmoid air cells bilaterally. CT cervical spine: No demonstrable fracture. Areas of mild spondylolisthesis are felt to be due to underlying spondylosis is stable. 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 There is a degree of spinal stenosis at C3-4 due to disc protrusion exacerbated by spondylolisthesis. There is calcification in both carotid arteries. Electronically Signed   By: Bretta Bang III M.D.   On: 09/02/2015 15:44   Ct Pelvis Wo Contrast  09/03/2015  CLINICAL DATA:  Bilateral hip pain and recent hemoglobin dropped on anticoagulation, possible hematoma EXAM: CT PELVIS WITHOUT CONTRAST TECHNIQUE: Multidetector CT imaging of the pelvis was performed following  the standard protocol without intravenous contrast. COMPARISON:  09/02/2015 FINDINGS: Postsurgical changes are noted in the lower lumbar spine stable from the prior exam. No acute bony abnormality is noted. The bladder is decompressed by Foley catheter. No sizable hematoma is identified to correspond with the patient's given clinical history. No pelvic mass lesion is seen. A minimal amount of free pelvic fluid is seen, new from the prior exam IMPRESSION: No evidence of soft tissue hematoma. Minimal free pelvic fluid is seen which was not present on the prior exam. The remainder of the exam is stable from the previous day. Electronically Signed   By: Alcide Clever M.D.   On: 09/03/2015 16:21   Dg Abd Portable 1v  09/04/2015  CLINICAL DATA:  Abdominal pain, generalized weakness, multiple falls, recent diagnosis of stroke, hypertension, hyperlipidemia, former smoker, diabetes mellitus, history nephrolithiasis EXAM: PORTABLE ABDOMEN - 1 VIEW COMPARISON:  CT abdomen and pelvis 09/02/2015 FINDINGS: Calcified gallstone RIGHT upper quadrant 13 x 12 mm. Tiny BILATERAL renal calculi. No ureteral calcifications. Scattered atherosclerotic calcifications with stent in a LEFT common iliac artery. Prior L4-S1 fusion with dextro convex lumbar scoliosis. Normal bowel gas pattern. No bowel dilatation or bowel wall thickening. IMPRESSION: Cholelithiasis. Tiny renal calculi. Normal bowel gas pattern. Electronically Signed   By: Ulyses Southward M.D.   On: 09/04/2015 11:10   US Abdomen Limited Ruq  09/04/2015  CLINICAL DATA:  RIGHT upper quadrant abdominal pain for 1 week EXAM: US ABDOMEN LIMITED - RIGHT UPPER QUADRANT COMPARISON:  CT abdomen 09/02/2015 FINDINGS: Gallbladder: Mobile 18 mm diameter shadowing gallstone within gallbladder. Associated gallbladder wall thickening. No sonographic Murphy sign or definite pericholecystic fluid. Common bile duct: Diameter: 2 mm diameter , normal Liver: Suboptimally visualized due to bowel gas.  No definite RIGHT upper quadrant free fluid. IMPRESSION: Cholelithiasis with mild associated gallbladder wall thickening though no definite sonographic Eulah Pont sign is seen. Unable to exclude early acute cholecystitis with this appearance. Electronically Signed   By: Ulyses Southward M.D.   On: 09/04/2015 16:32     CBC  Recent Labs Lab 09/02/15 1446  09/03/15 0657 09/03/15 1343 09/03/15 2243 09/04/15 0450 09/04/15 1401 09/05/15 0503  WBC 15.5*  --  15.6*  --   --  12.6*  --  8.6  HGB 7.6*  < > 9.6* 10.5* 9.5* 10.0* 10.8* 9.6*  HCT 24.3*  < > 29.8* 31.9* 29.0* 30.9* 33.4* 29.8*  PLT 131*  --  89*  --   --  95*  --  92*  MCV 93.1  --  88.2  --   --  87.8  --  88.7  MCH 29.1  --  28.4  --   --  28.4  --  28.6  MCHC 31.3  --  32.2  --   --  32.4  --  32.2  RDW 18.9*  --  19.4*  --   --  19.9*  --  19.9*  LYMPHSABS 1.3  --   --   --   --   --   --   --   MONOABS 0.6  --   --   --   --   --   --   --   EOSABS 0.2  --   --   --   --   --   --   --   BASOSABS 0.0  --   --   --   --   --   --   --   < > = values in this interval not displayed.  Chemistries   Recent Labs Lab 09/02/15 1446 09/02/15 1729 09/03/15 0657 09/03/15 1343 09/04/15 0450 09/05/15 0503  NA 140 139 140  --  142 140  K 4.7 4.5 4.3  --  4.1 3.5  CL 110 115* 113*  --  118* 118*  CO2 18*  --  17*  --  16* 17*  GLUCOSE 113* 92 85  --  92 88  BUN 45* 44* 41*  --  39* 32*  CREATININE 3.50* 3.30* 2.64*  --  2.10* 1.52*  CALCIUM 6.5*  --  5.9*  --  7.1* 6.6*  AST 100*  --   --  95* 78*  --   ALT 34  --   --  36 31  --   ALKPHOS 116  --   --  133* 109  --   BILITOT 0.7  --   --  1.1 0.9  --    ------------------------------------------------------------------------------------------------------------------ estimated creatinine clearance is 38.4 mL/min (by C-G formula based on Cr of 1.52). ------------------------------------------------------------------------------------------------------------------ No results for  input(s): HGBA1C in the last 72 hours. ------------------------------------------------------------------------------------------------------------------ No results for input(s): CHOL, HDL, LDLCALC, TRIG, CHOLHDL, LDLDIRECT in the last 72 hours. ------------------------------------------------------------------------------------------------------------------ No results for input(s): TSH, T4TOTAL, T3FREE, THYROIDAB in the last 72 hours.  Invalid input(s): FREET3 ------------------------------------------------------------------------------------------------------------------  Recent Labs  09/02/15 2236  FERRITIN 680*  TIBC 179*  IRON 28*    Coagulation profile  Recent Labs Lab 09/02/15 1609  INR 1.10    No results for input(s): DDIMER in the last 72 hours.  Cardiac Enzymes  Recent Labs Lab 09/03/15 0657 09/03/15 1343 09/04/15 0450  TROPONINI 10.29* 8.76* 5.48*   ------------------------------------------------------------------------------------------------------------------ Invalid input(s): POCBNP     Time Spent in minutes   30 minutes   ELGERGAWY, DAWOOD M.D on 09/05/2015 at 1:21 PM  Between 7am to 7pm - Pager - [phone]  After 7pm go to [email or web address] - password [facility]  [facility]   Office  [phone]

## 2015-09-06 DIAGNOSIS — N179 Acute kidney failure, unspecified: Secondary | ICD-10-CM

## 2015-09-06 DIAGNOSIS — D538 Other specified nutritional anemias: Secondary | ICD-10-CM

## 2015-09-06 DIAGNOSIS — J984 Other disorders of lung: Secondary | ICD-10-CM

## 2015-09-06 DIAGNOSIS — D649 Anemia, unspecified: Secondary | ICD-10-CM | POA: Insufficient documentation

## 2015-09-06 DIAGNOSIS — L8990 Pressure ulcer of unspecified site, unspecified stage: Secondary | ICD-10-CM

## 2015-09-06 LAB — BASIC METABOLIC PANEL
ANION GAP: 9 (ref 5–15)
BUN: 23 mg/dL — ABNORMAL HIGH (ref 6–20)
CALCIUM: 6.9 mg/dL — AB (ref 8.9–10.3)
CO2: 16 mmol/L — ABNORMAL LOW (ref 22–32)
Chloride: 117 mmol/L — ABNORMAL HIGH (ref 101–111)
Creatinine, Ser: 1.24 mg/dL (ref 0.61–1.24)
GFR, EST NON AFRICAN AMERICAN: 57 mL/min — AB (ref >60)
GLUCOSE: 101 mg/dL — AB (ref 65–99)
POTASSIUM: 3.4 mmol/L — AB (ref 3.5–5.1)
SODIUM: 142 mmol/L (ref 135–145)

## 2015-09-06 LAB — CBC
HCT: 31.3 % — ABNORMAL LOW (ref 39.0–52.0)
Hemoglobin: 9.9 g/dL — ABNORMAL LOW (ref 13.0–17.0)
MCH: 28.2 pg (ref 26.0–34.0)
MCHC: 31.6 g/dL (ref 30.0–36.0)
MCV: 89.2 fL (ref 78.0–100.0)
PLATELETS: 106 10*3/uL — AB (ref 150–400)
RBC: 3.51 MIL/uL — AB (ref 4.22–5.81)
RDW: 20 % — ABNORMAL HIGH (ref 11.5–15.5)
WBC: 8.5 10*3/uL (ref 4.0–10.5)

## 2015-09-06 LAB — TROPONIN I: TROPONIN I: 2.23 ng/mL — AB (ref <0.031)

## 2015-09-06 NOTE — Progress Notes (Signed)
Pt a/o.vss. IV patent. No complaints of any distress. Report given to [place], Charity fundraiser. Pt transferred to room 304 via wheelchair with nursing staff.

## 2015-09-06 NOTE — Progress Notes (Signed)
[facility] PROGRESS NOTE  [name] WCB:[id number] DOB: [birth year] DOA: 09/02/2015 PCP: Lubertha South, MD  Assessment/Plan: Non-ST elevated MI -Troponin continues to decrease to 2.23 from a max of around 10. -Echo with preserved ejection fraction of 55-60%, normal wall motion. -Appreciate cardiology input and recommendations. -Continue aspirin, statin, beta blocker, imdur. Hold ACE inhibitor in face of acute renal failure. -Non-STEMI likely due to severe increase in demand in setting of chronic obstructive coronary artery disease.  Acute renal failure -Creatinine continues to improve, down to 1.24 today.  Anemia -Likely due to anemia of chronic disease given history of rheumatoid arthritis on top of dual antiplatelet therapy. -GI is deferring endoscopic evaluation unless overt GI bleeding. -Continue twice a day oral PPI. -Monitor hemoglobin closely as Plavix has been resumed. -Consider transfusion for hemoglobin less than 9 given active cardiac disease.  History of CVA -Plavix has been resumed, monitor for GI bleed.  Adrenal insufficiency -Continue prednisone 10 mg, which his home dose.  Hypertension -Fair control. -Continue current medications including atenolol, Proscar, Imdur, Flomax.  Cholelithiasis -Ultrasound without evidence for cholecystitis. -Abdominal pain resolved.  Pressure ulcer -Continue local wound care.  Pulmonary fibrosis -Due to methotrexate use in setting of rheumatoid arthritis. -Stable, continue outpatient pulmonary follow-up.  Rheumatoid arthritis -Continue steroids.  Code Status: Full code Family Communication: Multiple family members including wife at bedside updated on plan of care  Disposition Plan: Transfer to floor, remove Foley catheter, consider discharge home over the next 24-48 hours. PT evaluation today.   Consultants:  Cardiology, Dr. branch   Antibiotics:  None   Subjective: Concern about removal of Foley  catheter, anxious to discharge home  Objective: Filed Vitals:   09/06/15 0500 09/06/15 0700 09/06/15 0800 09/06/15 0852  BP: 166/88 123/72 150/77   Pulse: 63 64 78   Temp:    97.2 F (36.2 C)  TempSrc:      Resp: 19 25 22    Height:      Weight: 65.6 kg (144 lb 10 oz)     SpO2: 100% 99% 98%     Intake/Output Summary (Last 24 hours) at 09/06/15 1027 Last data filed at 09/06/15 0700  Gross per 24 hour  Intake   2460 ml  Output   1450 ml  Net   1010 ml   Filed Weights   09/04/15 0500 09/05/15 0500 09/06/15 0500  Weight: 61.3 kg (135 lb 2.3 oz) 60 kg (132 lb 4.4 oz) 65.6 kg (144 lb 10 oz)    Exam:   General:  Alert, awake, oriented 3  Cardiovascular: Regular rate and rhythm, no murmurs, rubs or gallops  Respiratory: Bilateral, left greater than right dry crackles   Abdomen:  soft, nontender, nondistended, positive bowel sounds, no masses or organomegaly noted  Extremities:  1+ pitting edema bilaterally   Neurologic:   Grossly intact and nonfocal, I have not ambulated him  Data Reviewed: Basic Metabolic Panel:  Recent Labs Lab 09/02/15 1446 09/02/15 1729 09/03/15 0657 09/04/15 0450 09/05/15 0503 09/06/15 0449  NA 140 139 140 142 140 142  K 4.7 4.5 4.3 4.1 3.5 3.4*  CL 110 115* 113* 118* 118* 117*  CO2 18*  --  17* 16* 17* 16*  GLUCOSE 113* 92 85 92 88 101*  BUN 45* 44* 41* 39* 32* 23*  CREATININE 3.50* 3.30* 2.64* 2.10* 1.52* 1.24  CALCIUM 6.5*  --  5.9* 7.1* 6.6* 6.9*   Liver Function Tests:  Recent Labs Lab 09/02/15 1446 09/03/15 6160  09/03/15 1343 09/04/15 0450  AST 100*  --  95* 78*  ALT 34  --  36 31  ALKPHOS 116  --  133* 109  BILITOT 0.7  --  1.1 0.9  PROT 5.9*  --  6.0* 5.3*  ALBUMIN 2.5* 2.3* 2.5* 2.2*   No results for input(s): LIPASE, AMYLASE in the last 168 hours. No results for input(s): AMMONIA in the last 168 hours. CBC:  Recent Labs Lab 09/02/15 1446  09/03/15 0657  09/04/15 0450 09/04/15 1401 09/05/15 0503  09/05/15 1832 09/06/15 0449  WBC 15.5*  --  15.6*  --  12.6*  --  8.6  --  8.5  NEUTROABS 13.4*  --   --   --   --   --   --   --   --   HGB 7.6*  < > 9.6*  < > 10.0* 10.8* 9.6* 11.3* 9.9*  HCT 24.3*  < > 29.8*  < > 30.9* 33.4* 29.8* 35.1* 31.3*  MCV 93.1  --  88.2  --  87.8  --  88.7  --  89.2  PLT 131*  --  89*  --  95*  --  92*  --  106*  < > = values in this interval not displayed. Cardiac Enzymes:  Recent Labs Lab 09/02/15 2236 09/03/15 0657 09/03/15 1343 09/04/15 0450 09/05/15 0503 09/06/15 0449  CKTOTAL  --   --   --   --  294  --   TROPONINI 9.24* 10.29* 8.76* 5.48*  --  2.23*   BNP (last 3 results)  Recent Labs  04/11/15 1702  BNP 755.5*    ProBNP (last 3 results) No results for input(s): PROBNP in the last 8760 hours.  CBG:  Recent Labs Lab 09/02/15 2059 09/03/15 0746 09/03/15 1130 09/04/15 0722  GLUCAP 92 81 86 70    Recent Results (from the past 240 hour(s))  MRSA PCR Screening     Status: None   Collection Time: 09/03/15  2:30 AM  Result Value Ref Range Status   MRSA by PCR NEGATIVE NEGATIVE Final    Comment:        The GeneXpert MRSA Assay (FDA approved for NASAL specimens only), is one component of a comprehensive MRSA colonization surveillance program. It is not intended to diagnose MRSA infection nor to guide or monitor treatment for MRSA infections.   Urine culture     Status: None   Collection Time: 09/04/15  9:15 AM  Result Value Ref Range Status   Specimen Description URINE, CATHETERIZED  Final   Special Requests NONE  Final   Culture   Final    NO GROWTH 1 DAY Performed at [facility]    Report Status 09/05/2015 FINAL  Final     Studies: Dg Abd Portable 1v  09/04/2015  CLINICAL DATA:  Abdominal pain, generalized weakness, multiple falls, recent diagnosis of stroke, hypertension, hyperlipidemia, former smoker, diabetes mellitus, history nephrolithiasis EXAM: PORTABLE ABDOMEN - 1 VIEW COMPARISON:  CT abdomen and  pelvis 09/02/2015 FINDINGS: Calcified gallstone RIGHT upper quadrant 13 x 12 mm. Tiny BILATERAL renal calculi. No ureteral calcifications. Scattered atherosclerotic calcifications with stent in a LEFT common iliac artery. Prior L4-S1 fusion with dextro convex lumbar scoliosis. Normal bowel gas pattern. No bowel dilatation or bowel wall thickening. IMPRESSION: Cholelithiasis. Tiny renal calculi. Normal bowel gas pattern. Electronically Signed   By: Ulyses Southward M.D.   On: 09/04/2015 11:10   US Abdomen Limited Ruq  09/04/2015  CLINICAL  DATA:  RIGHT upper quadrant abdominal pain for 1 week EXAM: US ABDOMEN LIMITED - RIGHT UPPER QUADRANT COMPARISON:  CT abdomen 09/02/2015 FINDINGS: Gallbladder: Mobile 18 mm diameter shadowing gallstone within gallbladder. Associated gallbladder wall thickening. No sonographic Murphy sign or definite pericholecystic fluid. Common bile duct: Diameter: 2 mm diameter , normal Liver: Suboptimally visualized due to bowel gas. No definite RIGHT upper quadrant free fluid. IMPRESSION: Cholelithiasis with mild associated gallbladder wall thickening though no definite sonographic Eulah Pont sign is seen. Unable to exclude early acute cholecystitis with this appearance. Electronically Signed   By: Ulyses Southward M.D.   On: 09/04/2015 16:32    Scheduled Meds: . aspirin  325 mg Oral Daily  . atenolol  25 mg Oral BID  . atorvastatin  80 mg Oral Daily  . clopidogrel  75 mg Oral Daily  . collagenase   Topical Daily  . finasteride  5 mg Oral Daily  . folic acid  1 mg Oral Daily  . hydrALAZINE  37.5 mg Oral 3 times per day  . isosorbide mononitrate  30 mg Oral Daily  . levothyroxine  88 mcg Oral QAC breakfast  . lidocaine  1 patch Transdermal Q24H  . pantoprazole  40 mg Oral BID AC  . predniSONE  10 mg Oral Q breakfast  . sodium chloride  3 mL Intravenous Q12H  . tamsulosin  0.4 mg Oral Daily  . Vitamin D (Ergocalciferol)  50,000 Units Oral Q Mon   Continuous Infusions: . sodium chloride  75 mL/hr at 09/06/15 0500    Principal Problem:   Anemia Active Problems:   Rheumatoid arthritis (HCC)   Diabetes mellitus (HCC)   Chronic lung disease   Chronic pain syndrome   Secondary adrenal insufficiency (HCC)   Stroke (HCC)   AKI (acute kidney injury) (HCC)   Abdominal pain   NSTEMI (non-ST elevated myocardial infarction) (HCC)   Pressure ulcer   Acute renal failure (HCC)    Time spent:  40 minutes. Greater than 50% of this time was spent in direct contact with the patient coordinating care.    Chaya Jan  [facility] Pager [phone]  If 7PM-7AM, please contact night-coverage at [email or web address], password [facility] 09/06/2015, 10:27 AM  LOS: 4 days

## 2015-09-06 NOTE — Progress Notes (Signed)
Pt is c/o of needing to void and increased pressure. Attempted several times to help patient void in urinal with no success. Bladder scan revealed 602 cc of urine in bladder. MD paged to make aware.

## 2015-09-06 NOTE — Progress Notes (Signed)
Subjective:  Feels well. Denies cardiac complaints. Asking to go home.  Objective:  Vital Signs in the last 24 hours: Temp:  [97 F (36.1 C)-97.3 F (36.3 C)] 97 F (36.1 C) (12/14 0400) Pulse Rate:  [55-78] 78 (12/14 0800) Resp:  [13-25] 22 (12/14 0800) BP: (115-168)/(66-89) 150/77 mmHg (12/14 0800) SpO2:  [94 %-100 %] 98 % (12/14 0800) Weight:  [144 lb 10 oz (65.6 kg)] 144 lb 10 oz (65.6 kg) (12/14 0500)  Intake/Output from previous day: 12/13 0701 - 12/14 0700 In: 3410 [P.O.:1560; I.V.:1850] Out: 1450 [Urine:1450] Intake/Output from this shift:    Physical Exam: NECK: Without JVD, HJR, or bruit LUNGS: Decreased breath sounds with crackles at bases. HEART: Regular rate and rhythm,1/6 systolic murmur LSB,no gallop, rub, bruit, thrill, or heave EXTREMITIES: Without cyanosis, clubbing, or edema   Lab Results:  Recent Labs  09/05/15 0503 09/05/15 1832 09/06/15 0449  WBC 8.6  --  8.5  HGB 9.6* 11.3* 9.9*  PLT 92*  --  106*    Recent Labs  09/05/15 0503 09/06/15 0449  NA 140 142  K 3.5 3.4*  CL 118* 117*  CO2 17* 16*  GLUCOSE 88 101*  BUN 32* 23*  CREATININE 1.52* 1.24    Recent Labs  09/04/15 0450 09/06/15 0449  TROPONINI 5.48* 2.23*   Hepatic Function Panel  Recent Labs  09/04/15 0450  PROT 5.3*  ALBUMIN 2.2*  AST 78*  ALT 31  ALKPHOS 109  BILITOT 0.9  BILIDIR 0.4  IBILI 0.5   No results for input(s): CHOL in the last 72 hours. No results for input(s): PROTIME in the last 72 hours.     Cardiac Studies: Echocardiogram 09/04/2015 Left ventricle: The cavity size was normal. Wall thickness was   increased in a pattern of moderate LVH. Systolic function was   normal. The estimated ejection fraction was in the range of 55%   to 60%. Wall motion was normal; there were no regional wall   motion abnormalities. - Aortic valve: Moderately calcified annulus. Trileaflet; mildly   thickened leaflets. Valve area (VTI): 2.7 cm^2. Valve area  (Vmax): 2.6 cm^2. - Mitral valve: Moderately calcified annulus. Mildly thickened   leaflets . There was mild regurgitation. - Left atrium: The atrium was severely dilated. - Right ventricle: The cavity size was moderately dilated. - Right atrium: The atrium was moderately dilated. - Atrial septum: No defect or patent foramen ovale was identified. - Tricuspid valve: There was mild-moderate regurgitation. - Pulmonary arteries: Systolic pressure was mildly to moderately   increased. PA peak pressure: 39 mm Hg (S). - Technically adequate study.   Assessment/Plan:  NSTEMI: in setting of significant anemia and transient hypotension. EKG without change and echo with stable LV fx and no significant WMA. No chest pain and co-morbidities, plan to manage medically.  CABG '98, cath '09 patent LIMA-LAD, SVG-OM, occluded SVG-RCA .  Anemia: transfused 2 units. No overt bleeding. Restart Plavix when ok with GI  CKD: Crt improving 1.24 today: ACE-I on hold.  HTN: Hydralazine increased yesterday.  CVA 06/2015  COPD   LOS: 4 days    Jacolyn Reedy 09/06/2015, 8:36 AM   Attending Note Patient seen and discussed with PA Geni Bers, I agree with her documentation above. 69 yo male with multiple chronic medical comorbidities including RA with recent concern for RA related interstitial lung disease, CAD with prior CABG with last cath 2009 showing patent LIMA-LAD and SVG-OM but occluded SVG-RCA and occluded LAD, LCX, and RCA. He also has  history of PAD with prior bypass, HTN, HL, recent CVA in 06/2015, diastolic HF, admitted with weakness and falls. He reports abdominal pain and poor oral intake for the last several days. No chest pain, no SOB  Patient with NSTEMI in setting of significant anemia and transient hypotension. EKG without acute changes, echo with stable LVEF without significant WMAs. He has not had any chest pain. Given renal function and anemia, combine with absence of chest pain and no  hemodynamic instability, risk vs benefit ratio is in favor of medical management for his NSTEMI. I suspect the primary issues severe increase in demand in setting of chronic obstructive CAD as opposed to acute obstructive disease. Medical therapy with ASA, atenolol, atorva, imdur. No ACE-I given AKI, if Hgb remains stable can consider restarting plavix. Cr has trended down. We will follow up results of GI workup. Of note he is also being considered for bronch by pulmonary as outpatient to further workup abnormal CT findings, as well a potential RHC to evaluate for pulmonary HTN. Since his recent CVA he has asked to postpone those procedures.   Dominga Ferry MD

## 2015-09-06 NOTE — Progress Notes (Signed)
PT Cancellation Note  Patient Details Name: [name] MRN: [id number] DOB: 1946/02/21   Cancelled Treatment:    Reason Eval/Treat Not Completed: Medical issues which prohibited therapy;Patient not medically ready. Chart reviewed, RN and pharmacist consulted regarding pt's case. Pt subjectively feeling better today per RN. Medical record showing a drop from 11.3 to 9.9 in Hb in a 10 hour time frame, with no new labs in 6 hours. Pt calcium is also 6.9 (noted to be chronically low), increasing risk of cardiac arrhythmia. PT evaluation is being held at this time until additional labs verify that Hb drop has stabilized as to prevent additional cardiac stress with activity. Cardiac troponin appears to be downtrending still. GI consult has been put in, with hope to r/o any active bleeds, however pt is not medically appropriate for EGD at this time per gastro. Will attempt PT evaluation at later date/time when patient is appropriate.    Buccola,Allan C 09/06/2015, 12:43 PM  12:49 PM  Rosamaria Lints, PT, DPT [place] License # [id number]

## 2015-09-06 NOTE — Care Management Note (Signed)
Case Management Note  Patient Details  Name: JAEDEN MESSER MRN: [id number] Date of Birth: Jul 15, 1946  Subjective/Objective:                  Pt is from home, lives with his wife and is mostly ind with ADL's. Pt has cane, walker, wheelchair, and BSC at home prior to admission. Pt uses walker with ambulation the most. Pt is active with [facility] for RN, PT, SLP prior to admission. Pt has no home O2 prior to admission.   Action/Plan: Pt plans to return home with resumption of HH services through [facility]. Alroy Bailiff, of [facility], made aware of referral and will obtain pt info from chart. Pt anticipates DC on 09/07/2015. No new DME or HH services anticipated. Will cont to follow.   Expected Discharge Date:    09/07/2015              Expected Discharge Plan:  Home w Home Health Services  In-House Referral:  NA  Discharge planning Services  CM Consult  Post Acute Care Choice:  Resumption of Svcs/PTA Provider Choice offered to:  Patient  DME Arranged:    DME Agency:     HH Arranged:  RN, PT, Speech Therapy HH Agency:  [facility]  Status of Service:  In process, will continue to follow  Medicare Important Message Given:    Date Medicare IM Given:    Medicare IM give by:    Date Additional Medicare IM Given:    Additional Medicare Important Message give by:     If discussed at Long Length of Stay Meetings, dates discussed:    Additional Comments:  Malcolm Metro, RN 09/06/2015, 2:57 PM

## 2015-09-07 LAB — BASIC METABOLIC PANEL
ANION GAP: 4 — AB (ref 5–15)
BUN: 18 mg/dL (ref 6–20)
CALCIUM: 5.8 mg/dL — AB (ref 8.9–10.3)
CHLORIDE: 122 mmol/L — AB (ref 101–111)
CO2: 16 mmol/L — AB (ref 22–32)
Creatinine, Ser: 1.12 mg/dL (ref 0.61–1.24)
GFR calc non Af Amer: >60 mL/min (ref >60)
GLUCOSE: 93 mg/dL (ref 65–99)
POTASSIUM: 3.4 mmol/L — AB (ref 3.5–5.1)
Sodium: 142 mmol/L (ref 135–145)

## 2015-09-07 LAB — CBC
HEMATOCRIT: 32.8 % — AB (ref 39.0–52.0)
HEMOGLOBIN: 10.6 g/dL — AB (ref 13.0–17.0)
MCH: 28.6 pg (ref 26.0–34.0)
MCHC: 32.3 g/dL (ref 30.0–36.0)
MCV: 88.4 fL (ref 78.0–100.0)
Platelets: 133 10*3/uL — ABNORMAL LOW (ref 150–400)
RBC: 3.71 MIL/uL — AB (ref 4.22–5.81)
RDW: 19.9 % — ABNORMAL HIGH (ref 11.5–15.5)
WBC: 12.3 10*3/uL — ABNORMAL HIGH (ref 4.0–10.5)

## 2015-09-07 MED ORDER — ASPIRIN 81 MG PO CHEW: 81.0000 mg | CHEWABLE_TABLET | Freq: Every day | ORAL | Status: DC

## 2015-09-07 MED ORDER — LOSARTAN POTASSIUM 50 MG PO TABS
25.0000 mg | ORAL_TABLET | Freq: Every day | ORAL | Status: DC
  Administered 2015-09-07: 25 mg via ORAL
  Filled 2015-09-07: qty 1

## 2015-09-07 MED ORDER — ISOSORBIDE MONONITRATE ER 30 MG PO TB24: 30.0000 mg | ORAL_TABLET | Freq: Every day | ORAL | Status: AC

## 2015-09-07 MED ORDER — PANTOPRAZOLE SODIUM 40 MG PO TBEC: 40.0000 mg | DELAYED_RELEASE_TABLET | Freq: Two times a day (BID) | ORAL | Status: AC

## 2015-09-07 MED ORDER — HYDRALAZINE HCL 25 MG PO TABS: 37.5000 mg | ORAL_TABLET | Freq: Three times a day (TID) | ORAL | Status: AC

## 2015-09-07 NOTE — Progress Notes (Signed)
CRITICAL VALUE ALERT  Critical value received:  Calcium=5.8  Date of notification:  09/07/15  Time of notification:  0655  Critical value read back:Yes.    Nurse who received alert:  Jinny Sanders, RN    MD notified (1st page):  Dr. Sharl Ma  Time of first page:  [phone]  MD notified (2nd page):  Time of second page:  Responding MD:    Time MD responded:  Text paged with results via Amion

## 2015-09-07 NOTE — Care Management Note (Signed)
Case Management Note  Patient Details  Name: [name] MRN: [id number] Date of Birth: 1946-07-04  Pt discharging DC home today with [facility] services. Alroy Bailiff, of [facility], made aware of DC plan and will obtain pt info from chart. Pt aware HH has 48 hours to resume services at DC. No further CM needs.   Expected Discharge Date:    09/07/2015              Expected Discharge Plan:  Home w Home Health Services  In-House Referral:  NA  Discharge planning Services  CM Consult  Post Acute Care Choice:  Resumption of Svcs/PTA Provider Choice offered to:  Patient  DME Arranged:    DME Agency:     HH Arranged:  RN, PT, Speech Therapy, OT HH Agency:  [facility]  Status of Service:  Completed, signed off  Medicare Important Message Given:    Date Medicare IM Given:    Medicare IM give by:    Date Additional Medicare IM Given:    Additional Medicare Important Message give by:     If discussed at Long Length of Stay Meetings, dates discussed:    Additional Comments:  Malcolm Metro, RN 09/07/2015, 3:40 PM

## 2015-09-07 NOTE — Discharge Summary (Signed)
Physician Discharge Summary  [name] QKM:[id number] DOB: May 22, 1946 DOA: 09/02/2015  PCP: Lubertha South, MD  Admit date: 09/02/2015 Discharge date: 09/07/2015  Time spent: 45 minutes  Recommendations for Outpatient Follow-up:  -We'll be discharged home today. -Advised to follow-up with cardiologist in 2-3 weeks and with PCP in 2 weeks.   Discharge Diagnoses:  Principal Problem:   Anemia Active Problems:   Rheumatoid arthritis (HCC)   Diabetes mellitus (HCC)   Chronic lung disease   Chronic pain syndrome   Secondary adrenal insufficiency (HCC)   Stroke (HCC)   AKI (acute kidney injury) (HCC)   Abdominal pain   NSTEMI (non-ST elevated myocardial infarction) (HCC)   Pressure ulcer   Acute renal failure (HCC)   Absolute anemia   Discharge Condition: Stable and improved  Filed Weights   09/04/15 0500 09/05/15 0500 09/06/15 0500  Weight: 61.3 kg (135 lb 2.3 oz) 60 kg (132 lb 4.4 oz) 65.6 kg (144 lb 10 oz)    History of present illness:  As per Dr. Conley Rolls on 12/10: [name] is an 69 y.o. male with multiple medical problems whom I admitted in Oct 2016 for acute CVA, along with having hx of HLD, pulmonary fibrosis thought to be due to methyl trexate for his RA, afib, HTN, prior tobacco abuse, RA on chronic steroid with secondary adrenal insufficiency, chronic back pain, brought to the ER as he has been falling and feeling weak. He did not hit his head or loss consciousness. During his prior CVA, he was placed on ASA, Plavix per neurology recommnendation, along with Prednisone, and PPI. He was found to have moderate anemia, with Hb of 7.6 g/dL, with baseline around 9, along with AKI, with Cr of 3.5, with baseline Cr of 1.3-1.5. He has been on diuretics, along with an ACE I. He had head and neck CT showing no acute process, and abdominal CT showing no non obstructing kidney stone, no hydronephrosis, and no bleed. EKG showed no acute ST T changes. No troponins were done. He  denied black or bloody stool, and he has no abdominal pain. He did not take any NSAIDS, and he was reported to have OB negative stool by EDP.    Hospital Course:   Non-ST elevated MI -Troponin continues to decrease to 2.23 from a max of around 10. -Echo with preserved ejection fraction of 55-60%, normal wall motion. -Appreciate cardiology input and recommendations. -Continue aspirin (at reduced dose of 81 mg), statin, beta blocker, imdur, hydralazine. Losartan resumed as acute renal failure resolved. -Non-STEMI likely due to severe increase in demand in setting of chronic obstructive coronary artery disease.  Acute renal failure -Creatinine continues to improve, down to 1.12 today.  Anemia -Likely due to anemia of chronic disease given history of rheumatoid arthritis on top of dual antiplatelet therapy. -GI is deferring endoscopic evaluation unless overt GI bleeding. -Continue twice a day oral PPI. -Monitor hemoglobin closely as Plavix has been resumed. -Hemoglobin is stable to slightly increased at 10.6 on day of discharge.  History of CVA -Plavix has been resumed, monitor for GI bleed. -No signs of active bleeding while in the hospital.  Adrenal insufficiency -Continue prednisone 10 mg, which his home dose.  Hypertension -Fair control. -Continue current medications including atenolol, Proscar, Imdur, Flomax, hydralazine.  Cholelithiasis -Ultrasound without evidence for cholecystitis. -Abdominal pain resolved.  Pressure ulcer -Continue local wound care.  Pulmonary fibrosis -Due to methotrexate use in setting of rheumatoid arthritis. -Stable, continue outpatient pulmonary follow-up.  Rheumatoid arthritis -Continue steroids.  Procedures:  None   Consultations:  Cardiology, Dr. Wyline Mood  Discharge Instructions      Discharge Instructions    Diet - low sodium heart healthy    Complete by:  As directed      Increase activity slowly    Complete by:  As  directed             Medication List    STOP taking these medications        aspirin EC 325 MG tablet  Replaced by:  aspirin 81 MG chewable tablet     furosemide 20 MG tablet  Commonly known as:  LASIX     lisinopril 20 MG tablet  Commonly known as:  PRINIVIL,ZESTRIL     omeprazole 20 MG tablet  Commonly known as:  PRILOSEC OTC     sulfamethoxazole-trimethoprim 800-160 MG tablet  Commonly known as:  BACTRIM DS,SEPTRA DS     tiZANidine 4 MG tablet  Commonly known as:  ZANAFLEX      TAKE these medications        ALPRAZolam 1 MG tablet  Commonly known as:  XANAX  TAKE 1 TABLET BY MOUTH ONCE DAILY AS NEEDED FOR ANXIETY.     aspirin 81 MG chewable tablet  Chew 1 tablet (81 mg total) by mouth daily.  Start taking on:  09/08/2015     atenolol 25 MG tablet  Commonly known as:  TENORMIN  TAKE (1) TABLET BY MOUTH TWICE DAILY.     atorvastatin 80 MG tablet  Commonly known as:  LIPITOR  TAKE ONE TABLET BY MOUTH ONCE DAILY.     clopidogrel 75 MG tablet  Commonly known as:  PLAVIX  Take 1 tablet (75 mg total) by mouth daily.     finasteride 5 MG tablet  Commonly known as:  PROSCAR  Take 5 mg by mouth daily.     folic acid 1 MG tablet  Commonly known as:  FOLVITE  Take 1 mg by mouth daily.     hydrALAZINE 25 MG tablet  Commonly known as:  APRESOLINE  Take 1.5 tablets (37.5 mg total) by mouth every 8 (eight) hours.     HYDROcodone-acetaminophen 10-325 MG tablet  Commonly known as:  NORCO  Take 1 tablet up to five times daily     isosorbide mononitrate 30 MG 24 hr tablet  Commonly known as:  IMDUR  Take 1 tablet (30 mg total) by mouth daily.     leflunomide 20 MG tablet  Commonly known as:  ARAVA  TAKE 2 TABLETS BY MOUTH DAILY     levothyroxine 88 MCG tablet  Commonly known as:  SYNTHROID, LEVOTHROID  TAKE ONE TABLET BY MOUTH ONCE DAILY.     lidocaine 5 %  Commonly known as:  LIDODERM  Apply one patch up to 12 hours to affected area.     losartan 25  MG tablet  Commonly known as:  COZAAR  Take 1 tablet (25 mg total) by mouth daily.     Melatonin 3 MG Tabs  Take 3-6 mg by mouth at bedtime as needed.     multivitamin capsule  Take 1 capsule by mouth daily.     pantoprazole 40 MG tablet  Commonly known as:  PROTONIX  Take 1 tablet (40 mg total) by mouth 2 (two) times daily before a meal.     predniSONE 10 MG tablet  Commonly known as:  DELTASONE  TAKE ONE TABLET BY MOUTH ONCE DAILY.  tamsulosin 0.4 MG Caps capsule  Commonly known as:  FLOMAX  Take 0.4 mg by mouth daily.     Vitamin D (Ergocalciferol) 50000 UNITS Caps capsule  Commonly known as:  DRISDOL  Take 50,000 Units by mouth every 7 (seven) days. On Monday.       Allergies  Allergen Reactions  . Clindamycin/Lincomycin Other (See Comments)    Kidney Failure  . Amoxicillin Hives and Other (See Comments)    Hallucinations   . Ciprofloxacin Other (See Comments)    C-diff  . Other Hives, Diarrhea and Other (See Comments)    "Mycin" Causes C-diff  . Ceftin [Cefuroxime Axetil]     Taste like rusty iron, nausea,  . Doxycycline Nausea And Vomiting  . Levofloxacin Nausea And Vomiting   Follow-up Information    Follow up with [facility].   Contact information:   [address] [place] [address] [phone]       Follow up with Lubertha South, MD. Schedule an appointment as soon as possible for a visit in 2 weeks.   Specialty:  Family Medicine   Contact information:   [address] [place] [address] [phone]        The results of significant diagnostics from this hospitalization (including imaging, microbiology, ancillary and laboratory) are listed below for reference.    Significant Diagnostic Studies: Ct Abdomen Pelvis Wo Contrast  09/02/2015  CLINICAL DATA:  History of recent falls while on blood thinners EXAM: CT ABDOMEN AND PELVIS WITHOUT CONTRAST TECHNIQUE: Multidetector CT imaging of the abdomen and  pelvis was performed following the standard protocol without IV contrast. COMPARISON:  None. FINDINGS: Lung bases demonstrate some chronic fibrotic changes. No focal infiltrate or sizable effusion is seen. The liver, spleen, adrenal glands and pancreas are all normal in their CT appearance. The gallbladder is well distended and demonstrates a single dependent gallstone without complicating factors. Kidneys demonstrate bilateral nonobstructing renal stones. The ureters are within normal limits bilaterally. A few scattered hypodensities are noted within the right kidney consistent with cystic change. Aortic iliac calcifications are noted without aneurysmal dilatation. The appendix is within normal limits. Mild diverticular change is noted without diverticulitis. The bladder is well distended. Postsurgical changes are noted in the lower lumbar spine. No acute bony abnormality is seen. No soft tissue hematoma is seen. IMPRESSION: Cholelithiasis without complicating factors. No acute abnormality seen. Electronically Signed   By: Alcide Clever M.D.   On: 09/02/2015 17:33   Dg Chest 2 View  09/02/2015  CLINICAL DATA:  Fall, left shoulder and elbow pain, weakness EXAM: CHEST  2 VIEW COMPARISON:  07/2015 FINDINGS: Low lung volumes with similar chronic bibasilar interstitial opacities which correlate with areas of chronic bronchiectasis by comparison chest CT. Heart is enlarged. Prior coronary bypass changes noted. No effusion, superimposed edema or pneumothorax. Trachea midline. IMPRESSION: Cardiomegaly without CHF or pneumonia Chronic interstitial changes and bronchiectasis particularly in the lower lobes. Electronically Signed   By: Judie Petit.  Shick M.D.   On: 09/02/2015 15:05   Ct Head Wo Contrast  09/02/2015  CLINICAL DATA:  Pain following fall 2 days prior EXAM: CT HEAD WITHOUT CONTRAST CT CERVICAL SPINE WITHOUT CONTRAST TECHNIQUE: Multidetector CT imaging of the head and cervical spine was performed following the  standard protocol without intravenous contrast. Multiplanar CT image reconstructions of the cervical spine were also generated. COMPARISON:  CT head and CT cervical spine June 24, 2015; brain MRI June 26, 2015 FINDINGS: CT HEAD FINDINGS  Mild diffuse atrophy is stable. There is no intracranial mass, hemorrhage, extra-axial fluid collection, or midline shift. There is a focal infarct at the left frontal -parietal lobe which is undergone evolution compared to prior studies. There is evidence of a prior infarct in the mid right occipital lobe region, stable. There is evidence of a prior lacunar type infarct in the superior head of the caudate nucleus on the right. Decreased attenuation in the periphery of the posterior right cerebellum is felt to be due to prior infarct. This finding is somewhat better seen compared to prior CT but was present on MR examination 2 months prior and is unchanged. There is slight small vessel disease in the centra semiovale bilaterally. There is no new gray-white compartment lesion. No acute infarct evident. The bony calvarium appears intact. There is opacification of multiple mastoid air cells on the left, a finding noted previously. Right-sided mastoid air cells are clear. There is an air-fluid level in the left maxillary antrum. There is opacification in several ethmoid air cells bilaterally. No intraorbital lesions are identified. CT CERVICAL SPINE FINDINGS There is no demonstrable fracture. Slight retrolisthesis of C3 on C4 is stable. There is slight retrolisthesis of C6 on C7, stable. These changes are consistent with underlying spondylosis. There is no new spondylolisthesis. Prevertebral soft tissues and predental space regions are normal. There is marked disc space narrowing at C3-4, C5-6, C6-7, and C7-T1, stable. There is facet hypertrophy at most levels bilaterally. There is exit foraminal narrowing at multiple levels bilaterally, stable. There is broad-based disc protrusion  at C3-4, exacerbated by the spondylolisthesis. This finding again leads to mild central canal stenosis at C3-4. There is marked exit foraminal narrowing on the right at C6-7 and C7-T1, stable. There is also fairly significant exit foraminal narrowing at C6-7 on the left. There is calcification in each carotid artery. There is apical lung scarring bilaterally. IMPRESSION: CT head: Atrophy with prior infarcts, either stable or having undergone evolution compared to prior studies. No acute infarct evident. No mass, hemorrhage, or extra-axial fluid collection. Chronic left-sided mastoid disease. There is evidence of acute sinusitis with air-fluid level in left maxillary antrum. Opacification is also noted in several ethmoid air cells bilaterally. CT cervical spine: No demonstrable fracture. Areas of mild spondylolisthesis are felt to be due to underlying spondylosis is stable. 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 There is a degree of spinal stenosis at C3-4 due to disc protrusion exacerbated by spondylolisthesis. There is calcification in both carotid arteries. Electronically Signed   By: Bretta Bang III M.D.   On: 09/02/2015 15:44   Ct Cervical Spine Wo Contrast  09/02/2015  CLINICAL DATA:  Pain following fall 2 days prior EXAM: CT HEAD WITHOUT CONTRAST CT CERVICAL SPINE WITHOUT CONTRAST TECHNIQUE: Multidetector CT imaging of the head and cervical spine was performed following the standard protocol without intravenous contrast. Multiplanar CT image reconstructions of the cervical spine were also generated. COMPARISON:  CT head and CT cervical spine June 24, 2015; brain MRI June 26, 2015 FINDINGS: CT HEAD FINDINGS Mild diffuse atrophy is stable. There is no intracranial mass, hemorrhage, extra-axial fluid collection, or midline shift. There is a focal infarct at the left frontal -parietal lobe which is undergone evolution compared to prior studies. There is evidence of a prior  infarct in the mid right occipital lobe region, stable. There is evidence of a prior lacunar type infarct in the superior head of the caudate nucleus on the right. Decreased attenuation in the  periphery of the posterior right cerebellum is felt to be due to prior infarct. This finding is somewhat better seen compared to prior CT but was present on MR examination 2 months prior and is unchanged. There is slight small vessel disease in the centra semiovale bilaterally. There is no new gray-white compartment lesion. No acute infarct evident. The bony calvarium appears intact. There is opacification of multiple mastoid air cells on the left, a finding noted previously. Right-sided mastoid air cells are clear. There is an air-fluid level in the left maxillary antrum. There is opacification in several ethmoid air cells bilaterally. No intraorbital lesions are identified. CT CERVICAL SPINE FINDINGS There is no demonstrable fracture. Slight retrolisthesis of C3 on C4 is stable. There is slight retrolisthesis of C6 on C7, stable. These changes are consistent with underlying spondylosis. There is no new spondylolisthesis. Prevertebral soft tissues and predental space regions are normal. There is marked disc space narrowing at C3-4, C5-6, C6-7, and C7-T1, stable. There is facet hypertrophy at most levels bilaterally. There is exit foraminal narrowing at multiple levels bilaterally, stable. There is broad-based disc protrusion at C3-4, exacerbated by the spondylolisthesis. This finding again leads to mild central canal stenosis at C3-4. There is marked exit foraminal narrowing on the right at C6-7 and C7-T1, stable. There is also fairly significant exit foraminal narrowing at C6-7 on the left. There is calcification in each carotid artery. There is apical lung scarring bilaterally. IMPRESSION: CT head: Atrophy with prior infarcts, either stable or having undergone evolution compared to prior studies. No acute infarct evident.  No mass, hemorrhage, or extra-axial fluid collection. Chronic left-sided mastoid disease. There is evidence of acute sinusitis with air-fluid level in left maxillary antrum. Opacification is also noted in several ethmoid air cells bilaterally. CT cervical spine: No demonstrable fracture. Areas of mild spondylolisthesis are felt to be due to underlying spondylosis is stable. 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 There is a degree of spinal stenosis at C3-4 due to disc protrusion exacerbated by spondylolisthesis. There is calcification in both carotid arteries. Electronically Signed   By: Bretta Bang III M.D.   On: 09/02/2015 15:44   Ct Pelvis Wo Contrast  09/03/2015  CLINICAL DATA:  Bilateral hip pain and recent hemoglobin dropped on anticoagulation, possible hematoma EXAM: CT PELVIS WITHOUT CONTRAST TECHNIQUE: Multidetector CT imaging of the pelvis was performed following the standard protocol without intravenous contrast. COMPARISON:  09/02/2015 FINDINGS: Postsurgical changes are noted in the lower lumbar spine stable from the prior exam. No acute bony abnormality is noted. The bladder is decompressed by Foley catheter. No sizable hematoma is identified to correspond with the patient's given clinical history. No pelvic mass lesion is seen. A minimal amount of free pelvic fluid is seen, new from the prior exam IMPRESSION: No evidence of soft tissue hematoma. Minimal free pelvic fluid is seen which was not present on the prior exam. The remainder of the exam is stable from the previous day. Electronically Signed   By: Alcide Clever M.D.   On: 09/03/2015 16:21   Dg Abd Portable 1v  09/04/2015  CLINICAL DATA:  Abdominal pain, generalized weakness, multiple falls, recent diagnosis of stroke, hypertension, hyperlipidemia, former smoker, diabetes mellitus, history nephrolithiasis EXAM: PORTABLE ABDOMEN - 1 VIEW COMPARISON:  CT abdomen and pelvis 09/02/2015 FINDINGS: Calcified gallstone  RIGHT upper quadrant 13 x 12 mm. Tiny BILATERAL renal calculi. No ureteral calcifications. Scattered atherosclerotic calcifications with stent in a LEFT common iliac artery. Prior L4-S1 fusion with  dextro convex lumbar scoliosis. Normal bowel gas pattern. No bowel dilatation or bowel wall thickening. IMPRESSION: Cholelithiasis. Tiny renal calculi. Normal bowel gas pattern. Electronically Signed   By: Ulyses Southward M.D.   On: 09/04/2015 11:10   US Abdomen Limited Ruq  09/04/2015  CLINICAL DATA:  RIGHT upper quadrant abdominal pain for 1 week EXAM: US ABDOMEN LIMITED - RIGHT UPPER QUADRANT COMPARISON:  CT abdomen 09/02/2015 FINDINGS: Gallbladder: Mobile 18 mm diameter shadowing gallstone within gallbladder. Associated gallbladder wall thickening. No sonographic Murphy sign or definite pericholecystic fluid. Common bile duct: Diameter: 2 mm diameter , normal Liver: Suboptimally visualized due to bowel gas. No definite RIGHT upper quadrant free fluid. IMPRESSION: Cholelithiasis with mild associated gallbladder wall thickening though no definite sonographic Eulah Pont sign is seen. Unable to exclude early acute cholecystitis with this appearance. Electronically Signed   By: Ulyses Southward M.D.   On: 09/04/2015 16:32    Microbiology: Recent Results (from the past 240 hour(s))  MRSA PCR Screening     Status: None   Collection Time: 09/03/15  2:30 AM  Result Value Ref Range Status   MRSA by PCR NEGATIVE NEGATIVE Final    Comment:        The GeneXpert MRSA Assay (FDA approved for NASAL specimens only), is one component of a comprehensive MRSA colonization surveillance program. It is not intended to diagnose MRSA infection nor to guide or monitor treatment for MRSA infections.   Urine culture     Status: None   Collection Time: 09/04/15  9:15 AM  Result Value Ref Range Status   Specimen Description URINE, CATHETERIZED  Final   Special Requests NONE  Final   Culture   Final    NO GROWTH 1 DAY Performed at  [facility]    Report Status 09/05/2015 FINAL  Final     Labs: Basic Metabolic Panel:  Recent Labs Lab 09/03/15 0657 09/04/15 0450 09/05/15 0503 09/06/15 0449 09/07/15 0557  NA 140 142 140 142 142  K 4.3 4.1 3.5 3.4* 3.4*  CL 113* 118* 118* 117* 122*  CO2 17* 16* 17* 16* 16*  GLUCOSE 85 92 88 101* 93  BUN 41* 39* 32* 23* 18  CREATININE 2.64* 2.10* 1.52* 1.24 1.12  CALCIUM 5.9* 7.1* 6.6* 6.9* 5.8*   Liver Function Tests:  Recent Labs Lab 09/02/15 1446 09/03/15 0657 09/03/15 1343 09/04/15 0450  AST 100*  --  95* 78*  ALT 34  --  36 31  ALKPHOS 116  --  133* 109  BILITOT 0.7  --  1.1 0.9  PROT 5.9*  --  6.0* 5.3*  ALBUMIN 2.5* 2.3* 2.5* 2.2*   No results for input(s): LIPASE, AMYLASE in the last 168 hours. No results for input(s): AMMONIA in the last 168 hours. CBC:  Recent Labs Lab 09/02/15 1446  09/03/15 0657  09/04/15 0450 09/04/15 1401 09/05/15 0503 09/05/15 1832 09/06/15 0449 09/07/15 0557  WBC 15.5*  --  15.6*  --  12.6*  --  8.6  --  8.5 12.3*  NEUTROABS 13.4*  --   --   --   --   --   --   --   --   --   HGB 7.6*  < > 9.6*  < > 10.0* 10.8* 9.6* 11.3* 9.9* 10.6*  HCT 24.3*  < > 29.8*  < > 30.9* 33.4* 29.8* 35.1* 31.3* 32.8*  MCV 93.1  --  88.2  --  87.8  --  88.7  --  89.2 88.4  PLT 131*  --  89*  --  95*  --  92*  --  106* 133*  < > = values in this interval not displayed. Cardiac Enzymes:  Recent Labs Lab 09/02/15 2236 09/03/15 0657 09/03/15 1343 09/04/15 0450 09/05/15 0503 09/06/15 0449  CKTOTAL  --   --   --   --  294  --   TROPONINI 9.24* 10.29* 8.76* 5.48*  --  2.23*   BNP: BNP (last 3 results)  Recent Labs  04/11/15 1702  BNP 755.5*    ProBNP (last 3 results) No results for input(s): PROBNP in the last 8760 hours.  CBG:  Recent Labs Lab 09/02/15 2059 09/03/15 0746 09/03/15 1130 09/04/15 0722  GLUCAP 92 81 86 70       Signed:  HERNANDEZ ACOSTA,ESTELA  [facility] Pager:  [phone] 09/07/2015, 3:57 PM

## 2015-09-07 NOTE — Evaluation (Signed)
Physical Therapy Evaluation Patient Details Name: [name] MRN: [id number] DOB: 1946-08-03 Today's Date: 09/07/2015   History of Present Illness  69 y.o. male with recent admission in Oct 2016 for acute CVA, past medical history of HLD, pulmonary fibrosis thought to be due to methotrexate for his RA, afib, HTN, prior tobacco abuse, RA on chronic steroid with secondary adrenal insufficiency, chronic back pain, presents with generalized weakness, multiple falls, workup significant for anemia 7.6, baseline hemoglobin 10-11, no melena,received 2 units PRBC, patient on aspirin and Plavix for recent diagnosis of CVA ,as well as elevated troponin, and acute renal failure, hemoglobin stable after 2 units transfusion,We'll continue with conservative management for NSTEMI, resumed on aspirin and Plavix  Clinical Impression   Pt was seen for evaluation.  He was alert and oriented, very cooperative.  He had no c/o with BP now WNL (125/60).  He has severe RA with multiple joint abnormalities which severely limit his mobility.  He is primarily w/c dependent but does ambulate short distances with a walker or a cane.  Today, he is found to have generalized weakness due to recent illness but is able to transfer with min to SBA and actually ambulated 10'x2.  He has a full array of DME and he should be able to transition to home at d/c with HHPT and HHOT.  Wife was present during my visit and agrees with this plan.    Follow Up Recommendations Home health PT    Equipment Recommendations  None recommended by PT    Recommendations for Other Services    none   Precautions / Restrictions Precautions Precautions: Fall Restrictions Weight Bearing Restrictions: No      Mobility  Bed Mobility Overal bed mobility: Needs Assistance Bed Mobility: Supine to Sit     Supine to sit: Mod assist;HOB elevated     General bed mobility comments: pt does not need to do this transfer at home  Transfers Overall  transfer level: Needs assistance Equipment used: Rolling walker (2 wheeled) Transfers: Sit to/from Stand Sit to Stand: Min guard            Ambulation/Gait Ambulation/Gait assistance: Min guard Ambulation Distance (Feet): 10 Feet (x 2)   Gait Pattern/deviations: Trunk flexed;Decreased dorsiflexion - left;Decreased dorsiflexion - right   Gait velocity interpretation: <1.8 ft/sec, indicative of risk for recurrent falls    Stairs            Wheelchair Mobility    Modified Rankin (Stroke Patients Only)       Balance Overall balance assessment: Needs assistance Sitting-balance support: No upper extremity supported;Feet supported Sitting balance-Leahy Scale: Good     Standing balance support: Bilateral upper extremity supported Standing balance-Leahy Scale: Fair                               Pertinent Vitals/Pain Pain Assessment: No/denies pain    Home Living Family/patient expects to be discharged to:: Private residence Living Arrangements: Spouse/significant other Available Help at Discharge: Family;Available 24 hours/day Type of Home: House Home Access: Ramped entrance     Home Layout: Able to live on main level with bedroom/bathroom Home Equipment: Walker - 2 wheels;Cane - single point;Wheelchair - power;Walker - 4 wheels Additional Comments: sleeps in a lift chair    Prior Function Level of Independence: Needs assistance   Gait / Transfers Assistance Needed: usually sits in a rolling desk chair and rolls himself around...he is able to  ambulate short distances and occsionally uses a cane...family state that he is very unsafe with gait and should be using a walker  ADL's / Homemaking Assistance Needed: independent with bathing and dressing (needs help with buttons)...wife does all homemaking        Hand Dominance   Dominant Hand: Right    Extremity/Trunk Assessment   Upper Extremity Assessment: Generalized weakness (right hand grip  weaker than left as residual from old stroke)           Lower Extremity Assessment:  (fused left ankle, 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 with mild decrease of knee extension)      Cervical / Trunk Assessment: Kyphotic  Communication   Communication: No difficulties  Cognition Arousal/Alertness: Awake/alert Behavior During Therapy: WFL for tasks assessed/performed Overall Cognitive Status: Within Functional Limits for tasks assessed                      General Comments      Exercises General Exercises - Lower Extremity Ankle Circles/Pumps: AROM;10 reps;Supine;Right Quad Sets: AROM;Both;10 reps;Supine Gluteal Sets: AROM;Both;10 reps;Supine Short Arc Quad: AROM;Both;10 reps;Supine Heel Slides: AROM;Both;10 reps;Supine Hip ABduction/ADduction: AROM;Both;10 reps;Supine      Assessment/Plan    PT Assessment Patient needs continued PT services  PT Diagnosis Difficulty walking;Generalized weakness;Abnormality of gait   PT Problem List Decreased strength;Decreased activity tolerance;Decreased balance;Decreased mobility;Cardiopulmonary status limiting activity;Decreased skin integrity;Decreased knowledge of precautions  PT Treatment Interventions Gait training;Functional mobility training;Therapeutic exercise   PT Goals (Current goals can be found in the Care Plan section) Acute Rehab PT Goals Patient Stated Goal: wants to return home PT Goal Formulation: With patient/family Time For Goal Achievement: 09/21/15 Potential to Achieve Goals: Good    Frequency Min 3X/week   Barriers to discharge   none    Co-evaluation               End of Session Equipment Utilized During Treatment: Gait belt Activity Tolerance: Patient tolerated treatment well Patient left: in chair;with call bell/phone within reach;with family/visitor present Nurse Communication: Mobility status         Time: 1243-1330 PT Time Calculation (min) (ACUTE ONLY): 47 min   Charges:   PT  Evaluation $Initial PT Evaluation Tier I: 1 Procedure PT Treatments $Therapeutic Exercise: 8-22 mins   PT G CodesKonrad Penta  PT 09/07/2015, 1:49 PM [phone]

## 2015-09-07 NOTE — Progress Notes (Signed)
Pt IV and telemetry removed, tolerated well.  Reviewed discharge instructions with pt and family, answered all questions at this time.  

## 2015-09-07 NOTE — Progress Notes (Signed)
Subjective:   Denies cardiac complaints. Asking to go home. Asking to have his IV out  Objective:  Vital Signs in the last 24 hours: Temp:  [99.2 F (37.3 C)] 99.2 F (37.3 C) (12/15 0552) Pulse Rate:  [65-147] 79 (12/15 0552) Resp:  [14-24] 20 (12/15 0552) BP: (118-201)/(67-108) 176/108 mmHg (12/15 0552) SpO2:  [92 %-100 %] 96 % (12/15 0552)  Intake/Output from previous day: 12/14 0701 - 12/15 0700 In: -  Out: 1800 [Urine:1800] Intake/Output from this shift:    Physical Exam: NECK: Without JVD, HJR, or bruit LUNGS: Decreased breath sounds with crackles at bases. HEART: Regular rate and rhythm,1/6 systolic murmur LSB,no gallop, rub, bruit, thrill, or heave EXTREMITIES: Without cyanosis, clubbing, or edema   Lab Results:  Recent Labs  09/06/15 0449 09/07/15 0557  WBC 8.5 12.3*  HGB 9.9* 10.6*  PLT 106* 133*    Recent Labs  09/06/15 0449 09/07/15 0557  NA 142 142  K 3.4* 3.4*  CL 117* 122*  CO2 16* 16*  GLUCOSE 101* 93  BUN 23* 18  CREATININE 1.24 1.12    Recent Labs  09/06/15 0449  TROPONINI 2.23*   Hepatic Function Panel No results for input(s): PROT, ALBUMIN, AST, ALT, ALKPHOS, BILITOT, BILIDIR, IBILI in the last 72 hours. No results for input(s): CHOL in the last 72 hours. No results for input(s): PROTIME in the last 72 hours.     Cardiac Studies: Echocardiogram 09/04/2015 Left ventricle: The cavity size was normal. Wall thickness was   increased in a pattern of moderate LVH. Systolic function was   normal. The estimated ejection fraction was in the range of 55%   to 60%. Wall motion was normal; there were no regional wall   motion abnormalities. - Aortic valve: Moderately calcified annulus. Trileaflet; mildly   thickened leaflets. Valve area (VTI): 2.7 cm^2. Valve area   (Vmax): 2.6 cm^2. - Mitral valve: Moderately calcified annulus. Mildly thickened   leaflets . There was mild regurgitation. - Left atrium: The atrium was severely  dilated. - Right ventricle: The cavity size was moderately dilated. - Right atrium: The atrium was moderately dilated. - Atrial septum: No defect or patent foramen ovale was identified. - Tricuspid valve: There was mild-moderate regurgitation. - Pulmonary arteries: Systolic pressure was mildly to moderately   increased. PA peak pressure: 39 mm Hg (S). - Technically adequate study.   Assessment/Plan:  NSTEMI: in setting of significant anemia and transient hypotension. EKG without change and echo with stable LV fx and no significant WMA. No chest pain and co-morbidities, plan to manage medically.  CABG '98, cath '09 patent LIMA-LAD, SVG-OM, occluded SVG-RCA .  Anemia: transfused 2 units. No overt bleeding. Restart Plavix when ok with GI  CKD: Crt improving 1.12 today, SCr 2 on admission. He was on Cozaar prior to adm (Lisinopril had been stopped secondary to cough)  HTN: B/P uncontrolled now  CVA 06/2015  COPD   LOS: 5 days   Plan:  Labs stable, Hgb 10, SCr 1, Troponin down from peak of 10, now 2.   His B/P is not controlled. Will restart ARB I spoke with pt's daughter (a CCU RN at [facility]). He was not on both lisinopril and Cozaar prior to adm, Lisinopril had been stopped secondary to cough. Also will decrease ASA to 81 mg now that he is on Plavix.   Corine Shelter K PA 09/07/2015, 9:58 AM     Patient seen and discussed with PA Diona Fanti, I agree with his documentation. 69  yo male with multiple chronic medical comorbidities including RA with recent concern for RA related interstitial lung disease, CAD with prior CABG with last cath 2009 showing patent LIMA-LAD and SVG-OM but occluded SVG-RCA and occluded LAD, LCX, and RCA. He also has history of PAD with prior bypass, HTN, HL, recent CVA in 06/2015, diastolic HF, admitted with weakness and falls. He reports abdominal pain and poor oral intake for the last several days. No chest pain, no SOB  Patient with NSTEMI in setting of significant  anemia and transient hypotension. EKG without acute changes, echo with stable LVEF without significant WMAs. He has not had any chest pain. Given renal function and anemia, combine with absence of chest pain and no hemodynamic instability, risk vs benefit ratio is in favor of medical management for his NSTEMI. I suspect the primary issues severe increase in demand in setting of chronic obstructive CAD as opposed to acute obstructive disease. He also has multiple advanced comorbidities including severe RA that has rended nearly completely immobile.   Renal function has normalized, blood counts remain stable. He is back on his ASA and plavix. Elevated blood pressures, we will restart his ARB now that renal function normalized.   Of note he is also being considered for bronch by pulmonary as outpatient to further workup abnormal CT findings, as well a potential RHC to evaluate for pulmonary HTN. Since his recent CVA he has asked to postpone those procedures.    We will signoff inpatient care. He will need to f/u with [place] 3 weeks after discharge   Dominga Ferry MD

## 2015-09-08 ENCOUNTER — Encounter: Payer: Self-pay | Admitting: Internal Medicine

## 2015-09-08 ENCOUNTER — Telehealth: Payer: Self-pay | Admitting: Gastroenterology

## 2015-09-08 ENCOUNTER — Encounter: Payer: Self-pay | Admitting: Family

## 2015-09-08 NOTE — Telephone Encounter (Signed)
Patient needs hospital follow up in 4-6 weeks with RMR or ANNA only.

## 2015-09-08 NOTE — Telephone Encounter (Signed)
APPT MADE AND LETTER SENT  °

## 2015-09-11 ENCOUNTER — Ambulatory Visit: Payer: Medicare Other | Admitting: Family Medicine

## 2015-09-11 DIAGNOSIS — I69351 Hemiplegia and hemiparesis following cerebral infarction affecting right dominant side: Secondary | ICD-10-CM

## 2015-09-11 DIAGNOSIS — J849 Interstitial pulmonary disease, unspecified: Secondary | ICD-10-CM

## 2015-09-11 DIAGNOSIS — I10 Essential (primary) hypertension: Secondary | ICD-10-CM | POA: Diagnosis not present

## 2015-09-11 DIAGNOSIS — L89321 Pressure ulcer of left buttock, stage 1: Secondary | ICD-10-CM

## 2015-09-13 ENCOUNTER — Encounter (HOSPITAL_COMMUNITY): Payer: Medicare Other

## 2015-09-13 ENCOUNTER — Ambulatory Visit: Payer: Medicare Other | Admitting: Family

## 2015-09-13 ENCOUNTER — Other Ambulatory Visit (HOSPITAL_COMMUNITY): Payer: Medicare Other

## 2015-09-14 ENCOUNTER — Encounter: Payer: Self-pay | Admitting: Family Medicine

## 2015-09-14 ENCOUNTER — Ambulatory Visit (INDEPENDENT_AMBULATORY_CARE_PROVIDER_SITE_OTHER): Payer: Medicare Other | Admitting: Family Medicine

## 2015-09-14 ENCOUNTER — Other Ambulatory Visit: Payer: Self-pay | Admitting: Family Medicine

## 2015-09-14 VITALS — BP 134/72 | Temp 98.3°F | Wt 134.0 lb

## 2015-09-14 DIAGNOSIS — Z79899 Other long term (current) drug therapy: Secondary | ICD-10-CM | POA: Diagnosis not present

## 2015-09-14 DIAGNOSIS — D692 Other nonthrombocytopenic purpura: Secondary | ICD-10-CM | POA: Diagnosis not present

## 2015-09-14 DIAGNOSIS — L8990 Pressure ulcer of unspecified site, unspecified stage: Secondary | ICD-10-CM

## 2015-09-14 DIAGNOSIS — I639 Cerebral infarction, unspecified: Secondary | ICD-10-CM

## 2015-09-14 DIAGNOSIS — I10 Essential (primary) hypertension: Secondary | ICD-10-CM | POA: Diagnosis not present

## 2015-09-14 DIAGNOSIS — D649 Anemia, unspecified: Secondary | ICD-10-CM | POA: Diagnosis not present

## 2015-09-14 LAB — POCT HEMOGLOBIN: Hemoglobin: 10.5 g/dL — AB (ref 14.1–18.1)

## 2015-09-14 MED ORDER — CEPHALEXIN 500 MG PO CAPS: 500.0000 mg | ORAL_CAPSULE | Freq: Three times a day (TID) | ORAL | Status: DC

## 2015-09-14 NOTE — Progress Notes (Signed)
   Subjective:    Patient ID: [name], male    DOB: Feb 17, 1946, 69 y.o.   MRN: [id number]  HPIpt arrives today with [name] (family friend. She helps take care of him.) Follow up hospitalization. Went for pressure ulcer on buttock. Pt states it is better. Home health nurse is checking it.   Pt would like sore on right toe checked. Has not seen a podiatrist for quite some time. Developed a tiny sore on a toe. Somewhat concerned because a history of prior foot infection.  Having rash and pain on both sides. Did receive Lovenox in the hospital  Sinus pressure and drainage.   Patient was recently hospitalized. Hospital records reviewed in patient's presence. Developed anemia felt to be primarily due to anemia of chronic disease. Receive transfusions.  Patient initially had Plavix held. This is been resumed. No further symptoms of TIA or stroke.  Patient had his blood pressure medication maintained in the hospital. Continues to maintain it.  Ongoing challenges with shortness of breath. Followed by the pulmonologist. Now on prednisone at 10 mg daily   Review of Systems No headache no current chest pain no back pain no abdominal pain positive fatigue and weakness    Objective:   Physical Exam  Alert vitals stable blood pressure good on repeat. HEENT normal lungs clear heart regular rate and rhythm ecchymotic rash bilateral flank patient states somewhat improved ankles trace edema and substantial arthritis noted pressure ulcer good granulation tissue around presacral grade 3 pressure ulcer      Assessment & Plan:  Impression 1 renal insufficiency improved need to follow-up ascertain ongoing stability discussed #2 anemia improved need to reassess to obtain further numbers to assess stability #3 hypertension clinically stable #4 ecchymotic rash query relationship 2 anemia and/or Lovenox administration., Uncertain patient states feeling better #5 presacral decubitus ulcer plan ulcer  management discussed. Reassess CBC and met 7. Diet discussed easily 25 minutes spent most in discussion and assessing hospital records [facility]

## 2015-09-19 ENCOUNTER — Other Ambulatory Visit (HOSPITAL_COMMUNITY)
Admission: RE | Admit: 2015-09-19 | Discharge: 2015-09-19 | Disposition: A | Discharge status: Home / Self Care | Payer: Medicare Other | Source: Other Acute Inpatient Hospital | Attending: Family Medicine | Admitting: Family Medicine

## 2015-09-19 DIAGNOSIS — D649 Anemia, unspecified: Secondary | ICD-10-CM | POA: Insufficient documentation

## 2015-09-19 LAB — CBC WITH DIFFERENTIAL/PLATELET
Basophils Absolute: 0.1 10*3/uL (ref 0.0–0.1)
Basophils Relative: 1 %
EOS ABS: 0.1 10*3/uL (ref 0.0–0.7)
Eosinophils Relative: 0 %
HEMATOCRIT: 31 % — AB (ref 39.0–52.0)
HEMOGLOBIN: 9.8 g/dL — AB (ref 13.0–17.0)
LYMPHS ABS: 0.9 10*3/uL (ref 0.7–4.0)
Lymphocytes Relative: 7 %
MCH: 29.6 pg (ref 26.0–34.0)
MCHC: 31.6 g/dL (ref 30.0–36.0)
MCV: 93.7 fL (ref 78.0–100.0)
MONO ABS: 0.7 10*3/uL (ref 0.1–1.0)
MONOS PCT: 5 %
NEUTROS PCT: 87 %
Neutro Abs: 11 10*3/uL — ABNORMAL HIGH (ref 1.7–7.7)
Platelets: 218 10*3/uL (ref 150–400)
RBC: 3.31 MIL/uL — ABNORMAL LOW (ref 4.22–5.81)
RDW: 20.6 % — ABNORMAL HIGH (ref 11.5–15.5)
WBC: 12.7 10*3/uL — ABNORMAL HIGH (ref 4.0–10.5)

## 2015-09-19 LAB — BASIC METABOLIC PANEL
Anion gap: 9 (ref 5–15)
BUN: 24 mg/dL — ABNORMAL HIGH (ref 6–20)
CHLORIDE: 106 mmol/L (ref 101–111)
CO2: 24 mmol/L (ref 22–32)
CREATININE: 1.33 mg/dL — AB (ref 0.61–1.24)
Calcium: 8 mg/dL — ABNORMAL LOW (ref 8.9–10.3)
GFR calc non Af Amer: 53 mL/min — ABNORMAL LOW (ref >60)
GLUCOSE: 162 mg/dL — AB (ref 65–99)
Potassium: 4 mmol/L (ref 3.5–5.1)
Sodium: 139 mmol/L (ref 135–145)

## 2015-09-20 ENCOUNTER — Ambulatory Visit: Payer: Medicare Other | Admitting: Cardiology

## 2015-10-04 ENCOUNTER — Telehealth: Payer: Self-pay | Admitting: Family Medicine

## 2015-10-04 DIAGNOSIS — H9190 Unspecified hearing loss, unspecified ear: Secondary | ICD-10-CM

## 2015-10-04 NOTE — Telephone Encounter (Signed)
Pt called to check on referral to ENT, states he & his grandson called here asking about it & was told we would work on it however I do not see any mention of it in the chart and there is no referral in chart for ENT  Pt states he's having hearing loss and would like referral to Dr. Suszanne Conners  If ok to refer, please initiate referral so that I may process

## 2015-10-04 NOTE — Telephone Encounter (Signed)
[facility] (Referral for ENT in Epic)

## 2015-10-04 NOTE — Telephone Encounter (Signed)
Lets do, pt ha d tremendous number of concerns recent visits and i cannot rec all

## 2015-10-04 NOTE — Telephone Encounter (Signed)
Called and informed patient referral put in and someone from our office or Dr.Teoh's office will be in contact with an appointment. Patient verbalized understanding.

## 2015-10-07 ENCOUNTER — Encounter: Payer: Self-pay | Admitting: Family Medicine

## 2015-10-11 ENCOUNTER — Encounter: Payer: Self-pay | Admitting: Family

## 2015-10-16 ENCOUNTER — Telehealth: Payer: Self-pay | Admitting: Family Medicine

## 2015-10-16 NOTE — Telephone Encounter (Signed)
Patient wants to know if we got a letter from his insurance company regarding his alprazolam and the dosage?  I am assuming it is a prior authorization, but not sure.

## 2015-10-17 ENCOUNTER — Encounter: Payer: Self-pay | Admitting: Gastroenterology

## 2015-10-17 ENCOUNTER — Ambulatory Visit (INDEPENDENT_AMBULATORY_CARE_PROVIDER_SITE_OTHER): Payer: Medicare Other | Admitting: Gastroenterology

## 2015-10-17 VITALS — BP 116/68 | HR 95 | Temp 97.9°F | Ht 66.0 in | Wt 135.8 lb

## 2015-10-17 DIAGNOSIS — D649 Anemia, unspecified: Secondary | ICD-10-CM

## 2015-10-17 DIAGNOSIS — I6523 Occlusion and stenosis of bilateral carotid arteries: Secondary | ICD-10-CM

## 2015-10-17 MED ORDER — DEXLANSOPRAZOLE 60 MG PO CPDR: 60.0000 mg | DELAYED_RELEASE_CAPSULE | Freq: Every day | ORAL | Status: DC

## 2015-10-17 NOTE — Progress Notes (Signed)
Referring Provider: Merlyn Albert, MD Primary Care Physician:  Lubertha South, MD  Primary GI: Dr. Jena Gauss   Chief Complaint  Patient presents with  . Follow-up    HPI:   [name] is a 70 y.o. male with a history of achalasia, s/p Heller myotomy with Dor fundoplication in Feb 2013. Presenting today in hospital follow-up after admission for weakness, falls, and found to have Hgb 7.6 without overt GI bleeding and heme negative. Associate acute renal injury, NSTEMI and not a candidate for diagnostic evaluation in setting of multiple health issues and renal insufficiency. Received 2 units during admission with improvement of Hgb. At time of hospitalization had been on Plavix and ASA, with concern for occult GI bleeding. Last EGD in Oct 2013 with erosive reflux esophagitis, patent esophagus with Maloney dilation and NSAID gastropathy. Last colonoscopy 2012 with internal hemorrhoids, normal colon and TI. He is maintained on prednisone as outpatient. No invasive procedure performed during admission. Ferritin elevated, iron low. 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  Appears his Hgb remains in the 9-11 range.   No hematochezia. No melena. No abdominal pain. Back pain secondary to rheumatoid arthritis. Sometimes has fecal incontinence but is rare. Happens when he tries to get to the bathroom but can't make it in time due to mobility. Normally, he is a Community education officer #4. Happened two nights in a row last week, which is the first it has happened in months. Poor appetite. Trying to drink ensure, 1 or 2 a day. No dysphagia. On Plavix, 81 mg ASA. Sees cardiologist Feb 8th.   Past Medical History  Diagnosis Date  . 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 Hyperlipidemia   . Hypertension   . PVD (peripheral vascular disease) (HCC)     Left iliac PCI/stent  . Cerebrovascular disease   . Tobacco abuse, in remission     Remote  . GERD (gastroesophageal reflux disease)   . Hypothyroid     . Allergic rhinitis   . Chronic lung disease     ?  Rheumatoid lung; ?  Adverse reaction to methotrexate  . Schatzki's ring   . Hx of Clostridium difficile infection     Recurrent; associated 40 pound weight loss  . Achalasia   . Diabetes mellitus     borderline no meds  . Atrial fibrillation (HCC)   . Chronic back pain   . 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 Cervical spondylosis   . DDD (degenerative disc disease), lumbar   . Pneumonia   . Carotid artery occlusion   . Nephrolithiasis 2010    s/p lithotripsy  . Hiatal hernia     Past Surgical History  Procedure Laterality Date  . Coronary artery bypass graft      x6 In 1998  . Total knee arthroplasty      Right  . Ankle fusion      Left  . Wrist fusion      Right  . Shoulder surgery    . Colonoscopy  09/12/2011    Dr. Elly Modena hemorrhoids, normal colon and distal terminal ileum. Random bx negative. Stool for CDiff positive.  . Esophagogastroduodenoscopy  09/13/2011    Dr. Lyndle Herrlich plawues mid-esophagus KOH negative. Distal esophageal ring and ulcer. Mild gastritis. duodenal diverticulum. Savaory dilation 77mm.   Gaspar Bidding dilation  09/13/2011  . Esophageal manometry  09/30/2011    Procedure: ESOPHAGEAL MANOMETRY (EM);  Surgeon: Rob Bunting, MD;  Location: [facility]  ENDOSCOPY;  Service: Endoscopy;  Laterality: N/A;  . Esophagogastroduodenoscopy  09/12/2011    Dr. Rinaldo Ratel rings s/p dilation  . Cardiac surgery    . Esophagomyotomy  11/12/11    [facility]- with DOR antireflux surgery  . Esophagogastroduodenoscopy  06/25/2012    Dr. Jena Gauss: erosive reflux esophagitis, patent esophagus s/p malony dilation, gastric erosions/ulcerations consistent with NSAID gastropathy, neg H.pylori   . Savory dilation  06/25/2012    Procedure: SAVORY DILATION;  Surgeon: Corbin Ade, MD;  Location: [facility] SU[facility];  Service: Endoscopy;  Laterality: N/A;  Elease Hashimoto dilation  06/25/2012    Procedure: Elease Hashimoto DILATION;   Surgeon: Corbin Ade, MD;  Location: [facility];  Service: Endoscopy;  Laterality: N/A;  . Ankle fusion  09/01/2012    Procedure: ANKLE FUSION;  Surgeon: Valeria Batman, MD;  Location: [facility] OR;  Service: Orthopedics;  Laterality: Left;  Take down of angulated tibial/fibula Fractures  . Femoral-tibial bypass graft Left 01/28/2013    Procedure: BYPASS GRAFT FEMORAL-TIBIAL ARTERY;  Surgeon: Nada Libman, MD;  Location: [facility] OR;  Service: Vascular;  Laterality: Left;  Ultrasound Guided  . Amputation Left 01/28/2013    Procedure: AMPUTATION LEFT 5TH TOE;  Surgeon: Nada Libman, MD;  Location: [facility] OR;  Service: Vascular;  Laterality: Left;  . Spine surgery  11-03-13  . Abdominal aortagram N/A 01/01/2013    Procedure: ABDOMINAL Ronny Flurry;  Surgeon: Sherren Kerns, MD;  Location: [facility] CATH LAB;  Service: Cardiovascular;  Laterality: N/A;  . Abdominal aortagram N/A 10/05/2013    Procedure: ABDOMINAL AORTAGRAM;  Surgeon: Nada Libman, MD;  Location: [facility] CATH LAB;  Service: Cardiovascular;  Laterality: N/A;  . Lower extremity angiogram Left 10/05/2013    Procedure: LOWER EXTREMITY ANGIOGRAM;  Surgeon: Nada Libman, MD;  Location: [facility] CATH LAB;  Service: Cardiovascular;  Laterality: Left;  . Abdominal aortagram N/A 11/01/2014    Procedure: ABDOMINAL Ronny Flurry;  Surgeon: Nada Libman, MD;  Location: [facility] CATH LAB;  Service: Cardiovascular;  Laterality: N/A;  . Joint replacement Right 1999    Knee  . Toe amputation Left 09/05/2014    Removed left fifth toe  . Cataract extraction w/phaco Left 12/22/2014    Procedure: CATARACT EXTRACTION PHACO AND INTRAOCULAR LENS PLACEMENT (IOC);  Surgeon: Gemma Payor, MD;  Location: [facility] ORS;  Service: Ophthalmology;  Laterality: Left;  CDE:11.89  . Cataract extraction w/phaco Right 01/30/2015    Procedure: CATARACT EXTRACTION PHACO AND INTRAOCULAR LENS PLACEMENT RIGHT EYE;  Surgeon: Gemma Payor, MD;  Location: [facility] ORS;  Service: Ophthalmology;  Laterality: Right;  CDE:10.56    . Eye surgery Left December 22, 2014    Cataract  . Eye surgery Right December 31, 2014    Cataract    Current Outpatient Prescriptions  Medication Sig Dispense Refill  . ALPRAZolam (XANAX) 1 MG tablet TAKE 1 TABLET BY MOUTH ONCE DAILY AS NEEDED FOR ANXIETY. 30 tablet 5  . aspirin 81 MG chewable tablet Chew 1 tablet (81 mg total) by mouth daily.    Marland Kitchen atenolol (TENORMIN) 25 MG tablet TAKE (1) TABLET BY MOUTH TWICE DAILY. 60 tablet 5  . atorvastatin (LIPITOR) 80 MG tablet TAKE ONE TABLET BY MOUTH ONCE DAILY. 30 tablet 11  . clopidogrel (PLAVIX) 75 MG tablet Take 1 tablet (75 mg total) by mouth daily. 30 tablet 5  . finasteride (PROSCAR) 5 MG tablet Take 5 mg by mouth daily.    . folic acid (FOLVITE) 1 MG tablet Take 1 mg by mouth  daily.    . hydrALAZINE (APRESOLINE) 25 MG tablet Take 1.5 tablets (37.5 mg total) by mouth every 8 (eight) hours. 90 tablet 2  . isosorbide mononitrate (IMDUR) 30 MG 24 hr tablet Take 1 tablet (30 mg total) by mouth daily. 30 tablet 2  . levothyroxine (SYNTHROID, LEVOTHROID) 88 MCG tablet TAKE ONE TABLET BY MOUTH ONCE DAILY. 30 tablet 5  . lidocaine (LIDODERM) 5 % Apply one patch up to 12 hours to affected area. 30 patch 0  . losartan (COZAAR) 25 MG tablet Take 1 tablet (25 mg total) by mouth daily. 30 tablet 3  . Melatonin 3 MG TABS Take 3-6 mg by mouth at bedtime as needed.     . Multiple Vitamin (MULTIVITAMIN) capsule Take 1 capsule by mouth daily.    . pantoprazole (PROTONIX) 40 MG tablet Take 1 tablet (40 mg total) by mouth 2 (two) times daily before a meal. 60 tablet 2  . predniSONE (DELTASONE) 10 MG tablet TAKE ONE TABLET BY MOUTH ONCE DAILY. 30 tablet 11  . tamsulosin (FLOMAX) 0.4 MG CAPS capsule Take 0.4 mg by mouth daily.    . Vitamin D, Ergocalciferol, (DRISDOL) 50000 UNITS CAPS capsule Take 50,000 Units by mouth every 7 (seven) days. On Monday.    . cephALEXin (KEFLEX) 500 MG capsule Take 1 capsule (500 mg total) by mouth 3 (three) times daily. (Patient not  taking: Reported on 10/17/2015) 30 capsule 0  . HYDROcodone-acetaminophen (NORCO) 10-325 MG tablet Take 1 tablet up to five times daily (Patient not taking: Reported on 10/17/2015) 150 tablet 0   No current facility-administered medications for this visit.    Allergies as of 10/17/2015 - Review Complete 10/17/2015  Allergen Reaction Noted  . Clindamycin/lincomycin Other (See Comments) 02/23/2014  . Amoxicillin Hives and Other (See Comments) 08/24/2011  . Ciprofloxacin Other (See Comments) 01/21/2013  . Other Hives, Diarrhea, and Other (See Comments) 10/27/2014  . Ceftin [cefuroxime axetil]  08/30/2015  . Doxycycline Nausea And Vomiting 06/05/2011  . Levofloxacin Nausea And Vomiting 03/07/2008    Family History  Problem Relation Age of Onset  . Stomach cancer Mother 43  . Cancer Mother     Stomach  . Heart disease Mother   . Heart attack Mother   . Heart attack Father   . Heart disease Father   . Heart disease Brother   . Leukemia Brother   . Lung disease Son     infant  . Lung disease Daughter     infant  . Colon cancer Neg Hx     Social History   Social History  . Marital Status: Married    Spouse Name: N/A  . Number of Children: 1  . Years of Education: N/A   Occupational History  . retired [employer]    Social History Main Topics  . Smoking status: Former Smoker -- 0.50 packs/day for 5 years    Types: Cigarettes    Start date: 09/23/1956    Quit date: 09/23/1965  . Smokeless tobacco: Former Neurosurgeon  . Alcohol Use: No  . Drug Use: No  . Sexual Activity: Not Currently   Other Topics Concern  . None   Social History Narrative    Review of Systems: As mentioned in HPI   Physical Exam: BP 116/68 mmHg  Pulse 95  Temp(Src) 97.9 F (36.6 C) (Oral)  Ht 5\' 6"  (1.676 m)  Wt 135 lb 12.8 oz (61.598 kg)  BMI 21.93 kg/m2 General:   Alert and oriented. No distress  noted. Pleasant and cooperative.  Head:  Normocephalic and atraumatic. Eyes:   Conjuctiva clear without scleral icterus. Heart:  S1, S2 present without murmurs Abdomen:  +BS, soft, non-tender and non-distended. No rebound or guarding. No HSM or masses noted. Msk:  Right-sided weakness, arthritic changes to extremities, kyphosis  Extremities:  Without edema. Neurologic:  Alert and  oriented x4 Psych:  Alert and cooperative. Normal mood and affect.

## 2015-10-17 NOTE — Patient Instructions (Signed)
Please have blood work completed. I have also ordered a stool test to check for blood. When that is completed, please return to our office.  I have sent a prescription for Dexilant to your pharmacy. We will likely have to review this with your insurance company. Please call us if any issues getting this.   I will confer with Dr. Wyline Mood regarding endoscopic evaluation.

## 2015-10-17 NOTE — Telephone Encounter (Signed)
Spoke with patient, insurance is requiring information to support BID dose of PANTOPRAZOLE, we have not received anything on this, called [facility] - they will send me something so that I can contact insurance

## 2015-10-18 ENCOUNTER — Ambulatory Visit (INDEPENDENT_AMBULATORY_CARE_PROVIDER_SITE_OTHER): Payer: Medicare Other

## 2015-10-18 ENCOUNTER — Telehealth: Payer: Self-pay | Admitting: Family Medicine

## 2015-10-18 DIAGNOSIS — D649 Anemia, unspecified: Secondary | ICD-10-CM | POA: Diagnosis not present

## 2015-10-18 NOTE — Telephone Encounter (Signed)
Called [facility] and they did receive the order at 11:30 today. Tried to call to notify pt and number was busy

## 2015-10-18 NOTE — Telephone Encounter (Signed)
Patient called regarding the order for the hospital bed that was faxed over.  He said the nurse hadn't received the order back yet, but he just wanted to make sure that we had gotten the order.  I explained to him that we did and it was sent back on Monday.

## 2015-10-19 ENCOUNTER — Telehealth: Payer: Self-pay

## 2015-10-19 ENCOUNTER — Ambulatory Visit (INDEPENDENT_AMBULATORY_CARE_PROVIDER_SITE_OTHER): Payer: Medicare Other | Admitting: Family

## 2015-10-19 ENCOUNTER — Encounter: Payer: Self-pay | Admitting: Family

## 2015-10-19 ENCOUNTER — Ambulatory Visit (INDEPENDENT_AMBULATORY_CARE_PROVIDER_SITE_OTHER)
Admission: RE | Admit: 2015-10-19 | Discharge: 2015-10-19 | Disposition: A | Discharge status: Home / Self Care | Payer: Medicare Other | Source: Ambulatory Visit | Attending: Family | Admitting: Family

## 2015-10-19 ENCOUNTER — Other Ambulatory Visit: Payer: Self-pay

## 2015-10-19 ENCOUNTER — Observation Stay (HOSPITAL_COMMUNITY)
Admission: EM | Admit: 2015-10-19 | Discharge: 2015-10-20 | Disposition: A | Discharge status: Home / Self Care | Payer: Medicare Other | Attending: Internal Medicine | Admitting: Internal Medicine

## 2015-10-19 ENCOUNTER — Emergency Department (HOSPITAL_COMMUNITY): Payer: Medicare Other

## 2015-10-19 ENCOUNTER — Encounter (HOSPITAL_COMMUNITY): Payer: Self-pay | Admitting: Emergency Medicine

## 2015-10-19 VITALS — BP 119/63 | HR 84 | Temp 97.7°F | Resp 16 | Ht 66.0 in | Wt 136.0 lb

## 2015-10-19 DIAGNOSIS — Z880 Allergy status to penicillin: Secondary | ICD-10-CM | POA: Diagnosis not present

## 2015-10-19 DIAGNOSIS — Z87738 Personal history of other specified (corrected) congenital malformations of digestive system: Secondary | ICD-10-CM | POA: Insufficient documentation

## 2015-10-19 DIAGNOSIS — I129 Hypertensive chronic kidney disease with stage 1 through stage 4 chronic kidney disease, or unspecified chronic kidney disease: Secondary | ICD-10-CM | POA: Insufficient documentation

## 2015-10-19 DIAGNOSIS — N189 Chronic kidney disease, unspecified: Secondary | ICD-10-CM | POA: Insufficient documentation

## 2015-10-19 DIAGNOSIS — Z48812 Encounter for surgical aftercare following surgery on the circulatory system: Secondary | ICD-10-CM

## 2015-10-19 DIAGNOSIS — Z8673 Personal history of transient ischemic attack (TIA), and cerebral infarction without residual deficits: Secondary | ICD-10-CM

## 2015-10-19 DIAGNOSIS — Z79899 Other long term (current) drug therapy: Secondary | ICD-10-CM | POA: Diagnosis not present

## 2015-10-19 DIAGNOSIS — J309 Allergic rhinitis, unspecified: Secondary | ICD-10-CM | POA: Insufficient documentation

## 2015-10-19 DIAGNOSIS — E2749 Other adrenocortical insufficiency: Secondary | ICD-10-CM | POA: Diagnosis present

## 2015-10-19 DIAGNOSIS — D649 Anemia, unspecified: Secondary | ICD-10-CM

## 2015-10-19 DIAGNOSIS — Z87891 Personal history of nicotine dependence: Secondary | ICD-10-CM | POA: Insufficient documentation

## 2015-10-19 DIAGNOSIS — L819 Disorder of pigmentation, unspecified: Secondary | ICD-10-CM

## 2015-10-19 DIAGNOSIS — Z7902 Long term (current) use of antithrombotics/antiplatelets: Secondary | ICD-10-CM | POA: Insufficient documentation

## 2015-10-19 DIAGNOSIS — Z95828 Presence of other vascular implants and grafts: Secondary | ICD-10-CM | POA: Diagnosis not present

## 2015-10-19 DIAGNOSIS — E785 Hyperlipidemia, unspecified: Secondary | ICD-10-CM | POA: Diagnosis not present

## 2015-10-19 DIAGNOSIS — I679 Cerebrovascular disease, unspecified: Secondary | ICD-10-CM | POA: Diagnosis not present

## 2015-10-19 DIAGNOSIS — S301XXA Contusion of abdominal wall, initial encounter: Secondary | ICD-10-CM

## 2015-10-19 DIAGNOSIS — E039 Hypothyroidism, unspecified: Secondary | ICD-10-CM | POA: Insufficient documentation

## 2015-10-19 DIAGNOSIS — M069 Rheumatoid arthritis, unspecified: Secondary | ICD-10-CM | POA: Diagnosis not present

## 2015-10-19 DIAGNOSIS — M7981 Nontraumatic hematoma of soft tissue: Secondary | ICD-10-CM | POA: Diagnosis present

## 2015-10-19 DIAGNOSIS — N179 Acute kidney failure, unspecified: Secondary | ICD-10-CM | POA: Diagnosis present

## 2015-10-19 DIAGNOSIS — I6523 Occlusion and stenosis of bilateral carotid arteries: Secondary | ICD-10-CM

## 2015-10-19 DIAGNOSIS — E119 Type 2 diabetes mellitus without complications: Secondary | ICD-10-CM

## 2015-10-19 DIAGNOSIS — G8929 Other chronic pain: Secondary | ICD-10-CM | POA: Insufficient documentation

## 2015-10-19 DIAGNOSIS — I4891 Unspecified atrial fibrillation: Secondary | ICD-10-CM | POA: Diagnosis not present

## 2015-10-19 DIAGNOSIS — I639 Cerebral infarction, unspecified: Secondary | ICD-10-CM

## 2015-10-19 DIAGNOSIS — I739 Peripheral vascular disease, unspecified: Secondary | ICD-10-CM

## 2015-10-19 DIAGNOSIS — K449 Diaphragmatic hernia without obstruction or gangrene: Secondary | ICD-10-CM | POA: Insufficient documentation

## 2015-10-19 DIAGNOSIS — I2510 Atherosclerotic heart disease of native coronary artery without angina pectoris: Secondary | ICD-10-CM | POA: Diagnosis not present

## 2015-10-19 DIAGNOSIS — K220 Achalasia of cardia: Secondary | ICD-10-CM | POA: Diagnosis not present

## 2015-10-19 DIAGNOSIS — R231 Pallor: Secondary | ICD-10-CM | POA: Diagnosis not present

## 2015-10-19 DIAGNOSIS — I6529 Occlusion and stenosis of unspecified carotid artery: Secondary | ICD-10-CM | POA: Insufficient documentation

## 2015-10-19 DIAGNOSIS — Z7982 Long term (current) use of aspirin: Secondary | ICD-10-CM | POA: Diagnosis not present

## 2015-10-19 DIAGNOSIS — Z7952 Long term (current) use of systemic steroids: Secondary | ICD-10-CM | POA: Insufficient documentation

## 2015-10-19 DIAGNOSIS — J984 Other disorders of lung: Secondary | ICD-10-CM | POA: Diagnosis not present

## 2015-10-19 DIAGNOSIS — Z8701 Personal history of pneumonia (recurrent): Secondary | ICD-10-CM | POA: Insufficient documentation

## 2015-10-19 DIAGNOSIS — Z9889 Other specified postprocedural states: Secondary | ICD-10-CM | POA: Diagnosis not present

## 2015-10-19 DIAGNOSIS — I10 Essential (primary) hypertension: Secondary | ICD-10-CM | POA: Diagnosis present

## 2015-10-19 DIAGNOSIS — M5136 Other intervertebral disc degeneration, lumbar region: Secondary | ICD-10-CM | POA: Diagnosis not present

## 2015-10-19 DIAGNOSIS — D62 Acute posthemorrhagic anemia: Secondary | ICD-10-CM | POA: Diagnosis present

## 2015-10-19 DIAGNOSIS — S3013XA Contusion of flank (latus) region, initial encounter: Secondary | ICD-10-CM

## 2015-10-19 LAB — CBC
HCT: 27.7 % — ABNORMAL LOW (ref 39.0–52.0)
HEMOGLOBIN: 10.1 g/dL — AB (ref 12.6–17.7)
Hematocrit: 32.3 % — ABNORMAL LOW (ref 37.5–51.0)
Hemoglobin: 8.8 g/dL — ABNORMAL LOW (ref 13.0–17.0)
MCH: 29.6 pg (ref 26.6–33.0)
MCH: 31.1 pg (ref 26.0–34.0)
MCHC: 31.3 g/dL — AB (ref 31.5–35.7)
MCHC: 31.8 g/dL (ref 30.0–36.0)
MCV: 95 fL (ref 79–97)
MCV: 97.9 fL (ref 78.0–100.0)
PLATELETS: 175 10*3/uL (ref 150–400)
Platelets: 220 10*3/uL (ref 150–379)
RBC: 3.41 x10E6/uL — ABNORMAL LOW (ref 4.14–5.80)
RBC: 2.83 MIL/uL — AB (ref 4.22–5.81)
RDW: 18.4 % — AB (ref 12.3–15.4)
RDW: 18.8 % — ABNORMAL HIGH (ref 11.5–15.5)
WBC: 12.4 10*3/uL — ABNORMAL HIGH (ref 3.4–10.8)
WBC: 9.7 10*3/uL (ref 4.0–10.5)

## 2015-10-19 LAB — COMPREHENSIVE METABOLIC PANEL
ALT: 16 U/L — AB (ref 17–63)
AST: 26 U/L (ref 15–41)
Albumin: 3.4 g/dL — ABNORMAL LOW (ref 3.5–5.0)
Alkaline Phosphatase: 80 U/L (ref 38–126)
Anion gap: 8 (ref 5–15)
BILIRUBIN TOTAL: 0.6 mg/dL (ref 0.3–1.2)
BUN: 21 mg/dL — AB (ref 6–20)
CO2: 21 mmol/L — ABNORMAL LOW (ref 22–32)
CREATININE: 1.41 mg/dL — AB (ref 0.61–1.24)
Calcium: 7.8 mg/dL — ABNORMAL LOW (ref 8.9–10.3)
Chloride: 114 mmol/L — ABNORMAL HIGH (ref 101–111)
GFR calc Af Amer: 57 mL/min — ABNORMAL LOW (ref >60)
GFR, EST NON AFRICAN AMERICAN: 49 mL/min — AB (ref >60)
Glucose, Bld: 151 mg/dL — ABNORMAL HIGH (ref 65–99)
POTASSIUM: 3.7 mmol/L (ref 3.5–5.1)
Sodium: 143 mmol/L (ref 135–145)
TOTAL PROTEIN: 6 g/dL — AB (ref 6.5–8.1)

## 2015-10-19 LAB — RETICULOCYTES
RBC.: 2.8 MIL/uL — AB (ref 4.22–5.81)
RETIC COUNT ABSOLUTE: 56 10*3/uL (ref 19.0–186.0)
RETIC CT PCT: 2 % (ref 0.4–3.1)

## 2015-10-19 LAB — POC OCCULT BLOOD, ED: FECAL OCCULT BLD: NEGATIVE

## 2015-10-19 LAB — IFOBT (OCCULT BLOOD): IFOBT: NEGATIVE

## 2015-10-19 LAB — FERRITIN: FERRITIN: 539 ng/mL — AB (ref 30–400)

## 2015-10-19 LAB — IRON AND TIBC
IRON SATURATION: 22 % (ref 15–55)
Iron: 55 ug/dL (ref 38–169)
Total Iron Binding Capacity: 250 ug/dL (ref 250–450)
UIBC: 195 ug/dL (ref 111–343)

## 2015-10-19 LAB — TYPE AND SCREEN
ABO/RH(D): A POS
ANTIBODY SCREEN: NEGATIVE

## 2015-10-19 LAB — BRAIN NATRIURETIC PEPTIDE: B Natriuretic Peptide: 909 pg/mL — ABNORMAL HIGH (ref 0.0–100.0)

## 2015-10-19 LAB — APTT: aPTT: 25 seconds (ref 24–37)

## 2015-10-19 LAB — PROTIME-INR
INR: 1.07 (ref 0.00–1.49)
Prothrombin Time: 14.1 seconds (ref 11.6–15.2)

## 2015-10-19 LAB — TROPONIN I: Troponin I: 0.03 ng/mL (ref <0.031)

## 2015-10-19 MED ORDER — ALPRAZOLAM 1 MG PO TABS
1.0000 mg | ORAL_TABLET | Freq: Once | ORAL | Status: AC
  Administered 2015-10-20: 1 mg via ORAL
  Filled 2015-10-19: qty 1

## 2015-10-19 MED ORDER — HYDROCODONE-ACETAMINOPHEN 10-325 MG PO TABS
1.0000 | ORAL_TABLET | Freq: Once | ORAL | Status: AC
  Administered 2015-10-19: 1 via ORAL
  Filled 2015-10-19: qty 1

## 2015-10-19 NOTE — ED Provider Notes (Signed)
CSN: [id number]     Arrival date & time 10/19/15  1824 History   First MD Initiated Contact with Patient 10/19/15 1826     Chief Complaint  Patient presents with  . Bleeding/Bruising  . Shortness of Breath    HPI Patient states she's had trouble with shortness of breath primarily on exertion over the last couple of days. Patient states in general when he is at rest he has no complaints. However when he starts to walk even short distances in his house using his walker he starts to become short of breath and fatigued. When he woke up this morning he noticed that he had a large bruise on his left flank area. He does not recall any recent injuries. Patient was following up with one of his doctors today and examined the bruises well and suggested he talked to his primary doctor as soon as possible. He spoke to his doctor's office and was told to come to the emergency room for evaluation. He denies any trouble with chest pain. He denies any trouble with any blood in his stool or dark stool Past Medical History  Diagnosis Date  . 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 Hyperlipidemia   . Hypertension   . PVD (peripheral vascular disease) (HCC)     Left iliac PCI/stent  . Cerebrovascular disease   . Tobacco abuse, in remission     Remote  . GERD (gastroesophageal reflux disease)   . Hypothyroid   . Allergic rhinitis   . Chronic lung disease     ?  Rheumatoid lung; ?  Adverse reaction to methotrexate  . Schatzki's ring   . Hx of Clostridium difficile infection     Recurrent; associated 40 pound weight loss  . Achalasia   . Diabetes mellitus     borderline no meds  . Atrial fibrillation (HCC)   . Chronic back pain   . 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 Cervical spondylosis   . DDD (degenerative disc disease), lumbar   . Pneumonia   . Carotid artery occlusion   . Nephrolithiasis 2010    s/p lithotripsy  . Hiatal hernia    Past Surgical History   Procedure Laterality Date  . Coronary artery bypass graft      x6 In 1998  . Total knee arthroplasty      Right  . Ankle fusion      Left  . Wrist fusion      Right  . Shoulder surgery    . Colonoscopy  09/12/2011    Dr. Elly Modena hemorrhoids, normal colon and distal terminal ileum. Random bx negative. Stool for CDiff positive.  . Esophagogastroduodenoscopy  09/13/2011    Dr. Lyndle Herrlich plawues mid-esophagus KOH negative. Distal esophageal ring and ulcer. Mild gastritis. duodenal diverticulum. Savaory dilation 63mm.   Gaspar Bidding dilation  09/13/2011  . Esophageal manometry  09/30/2011    Procedure: ESOPHAGEAL MANOMETRY (EM);  Surgeon: Rob Bunting, MD;  Location: [facility];  Service: Endoscopy;  Laterality: N/A;  . Esophagogastroduodenoscopy  09/12/2011    Dr. Rinaldo Ratel rings s/p dilation  . Cardiac surgery    . Esophagomyotomy  11/12/11    [facility]- with DOR antireflux surgery  . Esophagogastroduodenoscopy  06/25/2012    Dr. Jena Gauss: erosive reflux esophagitis, patent esophagus s/p malony dilation, gastric erosions/ulcerations consistent with NSAID gastropathy, neg H.pylori   . Savory dilation  06/25/2012    Procedure: SAVORY DILATION;  Surgeon: Corbin Ade, MD;  Location: [facility] SUITE;  Service: Endoscopy;  Laterality: N/A;  Elease Hashimoto dilation  06/25/2012    Procedure: Elease Hashimoto DILATION;  Surgeon: Corbin Ade, MD;  Location: [facility];  Service: Endoscopy;  Laterality: N/A;  . Ankle fusion  09/01/2012    Procedure: ANKLE FUSION;  Surgeon: Valeria Batman, MD;  Location: [facility] OR;  Service: Orthopedics;  Laterality: Left;  Take down of angulated tibial/fibula Fractures  . Femoral-tibial bypass graft Left 01/28/2013    Procedure: BYPASS GRAFT FEMORAL-TIBIAL ARTERY;  Surgeon: Nada Libman, MD;  Location: [facility] OR;  Service: Vascular;  Laterality: Left;  Ultrasound Guided  . Amputation Left 01/28/2013    Procedure: AMPUTATION LEFT 5TH TOE;  Surgeon: Nada Libman, MD;   Location: [facility] OR;  Service: Vascular;  Laterality: Left;  . Spine surgery  11-03-13  . Abdominal aortagram N/A 01/01/2013    Procedure: ABDOMINAL Ronny Flurry;  Surgeon: Sherren Kerns, MD;  Location: [facility] CATH LAB;  Service: Cardiovascular;  Laterality: N/A;  . Abdominal aortagram N/A 10/05/2013    Procedure: ABDOMINAL AORTAGRAM;  Surgeon: Nada Libman, MD;  Location: [facility] CATH LAB;  Service: Cardiovascular;  Laterality: N/A;  . Lower extremity angiogram Left 10/05/2013    Procedure: LOWER EXTREMITY ANGIOGRAM;  Surgeon: Nada Libman, MD;  Location: [facility] CATH LAB;  Service: Cardiovascular;  Laterality: Left;  . Abdominal aortagram N/A 11/01/2014    Procedure: ABDOMINAL Ronny Flurry;  Surgeon: Nada Libman, MD;  Location: [facility] CATH LAB;  Service: Cardiovascular;  Laterality: N/A;  . Joint replacement Right 1999    Knee  . Toe amputation Left 09/05/2014    Removed left fifth toe  . Cataract extraction w/phaco Left 12/22/2014    Procedure: CATARACT EXTRACTION PHACO AND INTRAOCULAR LENS PLACEMENT (IOC);  Surgeon: Gemma Payor, MD;  Location: [facility] ORS;  Service: Ophthalmology;  Laterality: Left;  CDE:11.89  . Cataract extraction w/phaco Right 01/30/2015    Procedure: CATARACT EXTRACTION PHACO AND INTRAOCULAR LENS PLACEMENT RIGHT EYE;  Surgeon: Gemma Payor, MD;  Location: [facility] ORS;  Service: Ophthalmology;  Laterality: Right;  CDE:10.56  . Eye surgery Left December 22, 2014    Cataract  . Eye surgery Right December 31, 2014    Cataract   Family History  Problem Relation Age of Onset  . Stomach cancer Mother 52  . Cancer Mother     Stomach  . Heart disease Mother   . Heart attack Mother   . Heart attack Father   . Heart disease Father   . Heart disease Brother   . Leukemia Brother   . Lung disease Son     infant  . Lung disease Daughter     infant  . Colon cancer Neg Hx    Social History  Substance Use Topics  . Smoking status: Former Smoker -- 0.50 packs/day for 5 years    Types: Cigarettes    Start  date: 09/23/1956    Quit date: 09/23/1965  . Smokeless tobacco: Former Neurosurgeon  . Alcohol Use: No    Review of Systems  All other systems reviewed and are negative.     Allergies  Clindamycin/lincomycin; Amoxicillin; Ciprofloxacin; Other; Ceftin; Doxycycline; and Levofloxacin  Home Medications   Prior to Admission medications   Medication Sig Start Date End Date Taking? Authorizing Provider  ALPRAZolam (XANAX) 1 MG tablet TAKE 1 TABLET BY MOUTH ONCE DAILY AS NEEDED FOR ANXIETY. Patient taking differently: TAKE 1 TABLET BY MOUTH ONCE DAILY AT BEDTIME 05/11/15  Yes Vilinda Blanks  Gerda Diss, MD  aspirin 81 MG chewable tablet Chew 1 tablet (81 mg total) by mouth daily. 09/08/15  Yes Estela Isaiah Blakes, MD  atenolol (TENORMIN) 25 MG tablet TAKE (1) TABLET BY MOUTH TWICE DAILY. 04/12/15  Yes Merlyn Albert, MD  atorvastatin (LIPITOR) 80 MG tablet TAKE ONE TABLET BY MOUTH ONCE DAILY. 08/14/15  Yes Antoine Poche, MD  clopidogrel (PLAVIX) 75 MG tablet Take 1 tablet (75 mg total) by mouth daily. 06/26/15  Yes Houston Siren, MD  finasteride (PROSCAR) 5 MG tablet Take 5 mg by mouth daily.   Yes Historical Provider, MD  folic acid (FOLVITE) 1 MG tablet Take 1 mg by mouth daily.   Yes Historical Provider, MD  furosemide (LASIX) 20 MG tablet Take 20 mg by mouth daily as needed for fluid.   Yes Historical Provider, MD  hydrALAZINE (APRESOLINE) 25 MG tablet Take 1.5 tablets (37.5 mg total) by mouth every 8 (eight) hours. 09/07/15  Yes Henderson Cloud, MD  HYDROcodone-acetaminophen [facility]) 10-325 MG tablet Take 1 tablet by mouth 4 (four) times daily. *May take one additional tablet daily as needed for pain   Yes Historical Provider, MD  isosorbide mononitrate (IMDUR) 30 MG 24 hr tablet Take 1 tablet (30 mg total) by mouth daily. 09/07/15  Yes Estela Isaiah Blakes, MD  levothyroxine (SYNTHROID, LEVOTHROID) 88 MCG tablet TAKE ONE TABLET BY MOUTH ONCE DAILY. 09/14/15  Yes Merlyn Albert, MD   losartan (COZAAR) 25 MG tablet Take 1 tablet (25 mg total) by mouth daily. 07/05/15  Yes Antoine Poche, MD  Melatonin 3 MG TABS Take 3-6 mg by mouth at bedtime as needed.    Yes Historical Provider, MD  Multiple Vitamin (MULTIVITAMIN) capsule Take 1 capsule by mouth daily.   Yes Historical Provider, MD  pantoprazole (PROTONIX) 40 MG tablet Take 1 tablet (40 mg total) by mouth 2 (two) times daily before a meal. 09/07/15  Yes Estela Isaiah Blakes, MD  predniSONE (DELTASONE) 10 MG tablet TAKE ONE TABLET BY MOUTH ONCE DAILY. 04/04/15  Yes Merlyn Albert, MD  Probiotic Product (FLORAJEN3 PO) Take 1 capsule by mouth at bedtime.   Yes Historical Provider, MD  tamsulosin (FLOMAX) 0.4 MG CAPS capsule Take 0.4 mg by mouth every evening.    Yes Historical Provider, MD  Tofacitinib Citrate (XELJANZ) 5 MG TABS Take 5 mg by mouth daily as needed.   Yes Historical Provider, MD  Vitamin D, Ergocalciferol, (DRISDOL) 50000 UNITS CAPS capsule Take 50,000 Units by mouth every 7 (seven) days. On Monday.   Yes Historical Provider, MD  dexlansoprazole (DEXILANT) 60 MG capsule Take 1 capsule (60 mg total) by mouth daily. Patient not taking: Reported on 10/19/2015 10/17/15   Nira Retort, NP  lidocaine (LIDODERM) 5 % Apply one patch up to 12 hours to affected area. Patient not taking: Reported on 10/19/2015 07/26/15   Merlyn Albert, MD   BP 129/77 mmHg  Pulse 70  Temp(Src) 97.9 F (36.6 C) (Oral)  Resp 17  Ht 5\' 6"  (1.676 m)  Wt 61.236 kg  BMI 21.80 kg/m2  SpO2 95% Physical Exam  Constitutional: No distress.  HENT:  Head: Normocephalic and atraumatic.  Right Ear: External ear normal.  Left Ear: External ear normal.  Eyes: Conjunctivae are normal. Right eye exhibits no discharge. Left eye exhibits no discharge. No scleral icterus.  Neck: Neck supple. No tracheal deviation present.  Cardiovascular: Normal rate, regular rhythm and intact distal pulses.   Pulmonary/Chest:  Effort normal and breath sounds  normal. No stridor. No respiratory distress. He has no wheezes. He has no rales.  Abdominal: Soft. Bowel sounds are normal. He exhibits no distension. There is no tenderness. There is no rebound and no guarding.  Musculoskeletal: He exhibits no edema or tenderness.  Shortening of the left lower extremity, chronic deformity of the left elbow  Neurological: He is alert. He has normal strength. No cranial nerve deficit (no facial droop, extraocular movements intact, no slurred speech) or sensory deficit. He exhibits normal muscle tone. He displays no seizure activity. Coordination normal.  Skin: Skin is warm and dry. No rash noted. There is pallor.  Large superficial ecchymoses in the left flank area  Psychiatric: He has a normal mood and affect.  Nursing note and vitals reviewed.   ED Course  Procedures (including critical care time) Labs Review Labs Reviewed  CBC - Abnormal; Notable for the following:    RBC 2.83 (*)    Hemoglobin 8.8 (*)    HCT 27.7 (*)    RDW 18.8 (*)    All other components within normal limits  BRAIN NATRIURETIC PEPTIDE - Abnormal; Notable for the following:    B Natriuretic Peptide 909.0 (*)    All other components within normal limits  COMPREHENSIVE METABOLIC PANEL - Abnormal; Notable for the following:    Chloride 114 (*)    CO2 21 (*)    Glucose, Bld 151 (*)    BUN 21 (*)    Creatinine, Ser 1.41 (*)    Calcium 7.8 (*)    Total Protein 6.0 (*)    Albumin 3.4 (*)    ALT 16 (*)    GFR calc non Af Amer 49 (*)    GFR calc Af Amer 57 (*)    All other components within normal limits  TROPONIN I  PROTIME-INR  APTT  POC OCCULT BLOOD, ED  TYPE AND SCREEN    Imaging Review Dg Chest 2 View  10/19/2015  CLINICAL DATA:  Bruising around left lying. Shortness of breath since Tuesday. EXAM: CHEST  2 VIEW COMPARISON:  Chest x-ray dated 09/02/2015. FINDINGS: Cardiomegaly is stable. The probable mild scarring/fibrosis within each lung is unchanged. No new lung  findings. No confluent airspace opacity to suggest a developing pneumonia. No pleural effusion seen. No pneumothorax seen. Median sternotomy wires are stable in alignment and configuration. There appear to be slightly displaced fractures of the right lateral seventh and eighth ribs, of uncertain age but most likely chronic. No left-sided rib fracture identified. Mild degenerative change again noted within the thoracolumbar spine. IMPRESSION: 1. Cardiomegaly, stable. No evidence of significant volume overload/CHF. 2. Bilateral scarring/fibrosis, stable. No acute lung findings seen. No evidence of pneumonia. No pleural effusion. No pneumothorax seen. 3. Slightly displaced fracture within the right lateral seventh and/or eighth ribs. This is of uncertain age but most likely old. Any right-sided rib pain? No left-sided rib fracture seen. Electronically Signed   By: Bary Richard M.D.   On: 10/19/2015 19:46   I have personally reviewed and evaluated these images and lab results as part of my medical decision-making.   EKG Interpretation   Date/Time:  Thursday October 19 2015 18:35:24 EST Ventricular Rate:  82 PR Interval:  159 QRS Duration: 81 QT Interval:  415 QTC Calculation: 485 R Axis:   -21 Text Interpretation:  Normal sinus rhythm Premature atrial complexes Left  ventricular hypertrophy Anterior infarct, old Premature atrial complexes  are new compared to prior tracing Confirmed  by [facility]  MD-J, Cletis Athens [phone]) on  10/19/2015 6:47:40 PM      MDM   Final diagnoses:  Anemia, unspecified anemia type  Chronic renal insufficiency, unspecified stage  Hematoma of left flank, initial encounter    The patient's laboratory tests show a significant drop in his hemoglobin. His hemoglobin is 8.8 today.  Yesterday it was 10.1.  Patient has not noticed any blood in his stools. We will get a Hemoccult test while he is here.  Patient does have a new hematoma in the left flank. He denies any injuries. He is  not having any significant pain to suggest a spontaneous retroperitoneal hematoma. However we could consider a noncontrasted CT scan if his hemoglobin continues to decrease. IV contrast would be more beneficial however the patient has chronic renal insufficiency and is not a candidate for a contrasted IV study.  The patient is on Plavix and aspirin. We will need to hold those medications.  Initially the patient decided he wanted to leave AMA. However after discussing with family further he has agreed to be admitted to the hospital for further evaluation and treatment.    Linwood Dibbles, MD 10/19/15 2109

## 2015-10-19 NOTE — Telephone Encounter (Signed)
Results from [facility] placed on Gerrit Halls desk for review

## 2015-10-19 NOTE — Patient Instructions (Signed)
Peripheral Vascular Disease Peripheral vascular disease (PVD) is a disease of the blood vessels that are not part of your heart and brain. A simple term for PVD is poor circulation. In most cases, PVD narrows the blood vessels that carry blood from your heart to the rest of your body. This can result in a decreased supply of blood to your arms, legs, and internal organs, like your stomach or kidneys. However, it most often affects a person's lower legs and feet. There are two types of PVD.  Organic PVD. This is the more common type. It is caused by damage to the structure of blood vessels.  Functional PVD. This is caused by conditions that make blood vessels contract and tighten (spasm). Without treatment, PVD tends to get worse over time. PVD can also lead to acute ischemic limb. This is when an arm or limb suddenly has trouble getting enough blood. This is a medical emergency. CAUSES Each type of PVD has many different causes. The most common cause of PVD is buildup of a fatty material (plaque) inside of your arteries (atherosclerosis). Small amounts of plaque can break off from the walls of the blood vessels and become lodged in a smaller artery. This blocks blood flow and can cause acute ischemic limb. Other common causes of PVD include:  Blood clots that form inside of blood vessels.  Injuries to blood vessels.  Diseases that cause inflammation of blood vessels or cause blood vessel spasms.  Health behaviors and health history that increase your risk of developing PVD. RISK FACTORS  You may have a greater risk of PVD if you:  Have a family history of PVD.  Have certain medical conditions, including:  High cholesterol.  Diabetes.  High blood pressure (hypertension).  Coronary heart disease.  Past problems with blood clots.  Past injury, such as burns or a broken bone. These may have damaged blood vessels in your limbs.  Buerger disease. This is caused by inflamed blood  vessels in your hands and feet.  Some forms of arthritis.  Rare birth defects that affect the arteries in your legs.  Use tobacco.  Do not get enough exercise.  Are obese.  Are age 50 or older. SIGNS AND SYMPTOMS  PVD may cause many different symptoms. Your symptoms depend on what part of your body is not getting enough blood. Some common signs and symptoms include:  Cramps in your lower legs. This may be a symptom of poor leg circulation (claudication).  Pain and weakness in your legs while you are physically active that goes away when you rest (intermittent claudication).  Leg pain when at rest.  Leg numbness, tingling, or weakness.  Coldness in a leg or foot, especially when compared with the other leg.  Skin or hair changes. These can include:  Hair loss.  Shiny skin.  Pale or bluish skin.  Thick toenails.  Inability to get or maintain an erection (erectile dysfunction). People with PVD are more prone to developing ulcers and sores on their toes, feet, or legs. These may take longer than normal to heal. DIAGNOSIS Your health care provider may diagnose PVD from your signs and symptoms. The health care provider will also do a physical exam. You may have tests to find out what is causing your PVD and determine its severity. Tests may include:  Blood pressure recordings from your arms and legs and measurements of the strength of your pulses (pulse volume recordings).  Imaging studies using sound waves to take pictures of   the blood flow through your blood vessels (Doppler ultrasound).  Injecting a dye into your blood vessels before having imaging studies using:  X-rays (angiogram or arteriogram).  Computer-generated X-rays (CT angiogram).  A powerful electromagnetic field and a computer (magnetic resonance angiogram or MRA). TREATMENT Treatment for PVD depends on the cause of your condition and the severity of your symptoms. It also depends on your age. Underlying  causes need to be treated and controlled. These include long-lasting (chronic) conditions, such as diabetes, high cholesterol, and high blood pressure. You may need to first try making lifestyle changes and taking medicines. Surgery may be needed if these do not work. Lifestyle changes may include:  Quitting smoking.  Exercising regularly.  Following a low-fat, low-cholesterol diet. Medicines may include:  Blood thinners to prevent blood clots.  Medicines to improve blood flow.  Medicines to improve your blood cholesterol levels. Surgical procedures may include:  A procedure that uses an inflated balloon to open a blocked artery and improve blood flow (angioplasty).  A procedure to put in a tube (stent) to keep a blocked artery open (stent implant).  Surgery to reroute blood flow around a blocked artery (peripheral bypass surgery).  Surgery to remove dead tissue from an infected wound on the affected limb.  Amputation. This is surgical removal of the affected limb. This may be necessary in cases of acute ischemic limb that are not improved through medical or surgical treatments. HOME CARE INSTRUCTIONS  Take medicines only as directed by your health care provider.  Do not use any tobacco products, including cigarettes, chewing tobacco, or electronic cigarettes. If you need help quitting, ask your health care provider.  Lose weight if you are overweight, and maintain a healthy weight as directed by your health care provider.  Eat a diet that is low in fat and cholesterol. If you need help, ask your health care provider.  Exercise regularly. Ask your health care provider to suggest some good activities for you.  Use compression stockings or other mechanical devices as directed by your health care provider.  Take good care of your feet.  Wear comfortable shoes that fit well.  Check your feet often for any cuts or sores. SEEK MEDICAL CARE IF:  You have cramps in your legs  while walking.  You have leg pain when you are at rest.  You have coldness in a leg or foot.  Your skin changes.  You have erectile dysfunction.  You have cuts or sores on your feet that are not healing. SEEK IMMEDIATE MEDICAL CARE IF:  Your arm or leg turns cold and blue.  Your arms or legs become red, warm, swollen, painful, or numb.  You have chest pain or trouble breathing.  You suddenly have weakness in your face, arm, or leg.  You become very confused or lose the ability to speak.  You suddenly have a very bad headache or lose your vision.   This information is not intended to replace advice given to you by your health care provider. Make sure you discuss any questions you have with your health care provider.   Document Released: 10/17/2004 Document Revised: 09/30/2014 Document Reviewed: 02/17/2014 Elsevier Interactive Patient Education 2016 [employer]  

## 2015-10-19 NOTE — Progress Notes (Signed)
Pt returned ifobt and it was negative.  

## 2015-10-19 NOTE — ED Notes (Signed)
Per EMS patient's wife called from home due to bruise around left flank. Patient states shortness of breath since Tuesday. States no shortness of breath while lying, but get short of breath while ambulating.

## 2015-10-19 NOTE — Progress Notes (Signed)
[facility] [place] HISTORY AND PHYSICAL -PAD  History of Present Illness [name] is a 70 y.o. male patient of Dr. Myra Gianotti who is status post distal left superficial femoral to posterior tibial artery bypass graft with ipsilateral non-reversed, translocated saphenous vein, and left fifth toe amputation with metatarsal head on 01/28/2013. This was done in the setting of an ischemic left fifth toe. He then had left PTA of posterior tibial artery on 10/05/13.   On 11/01/14 Dr. Myra Gianotti performed an aortogram to evaluate bypass graft stenosis. Dr. Estanislado Spire impressions were as follows:  #1 lesion identified on ultrasound was not hemodynamically significant on angiography. The bypass graft on the left leg is widely patent. #2 80% stenosis on the right leg at the adductor canal. #3 high-grade stenosis in the posterior tibial artery on the right  Pt states his feet have not felt cold since this 11/01/14 aortogram.  Patient returns today for follow up of PAD; he is due for Duplex evaluation of minimal carotid artery stenosis in July 2017.  Pt states that he continues to perform leg exercises several times/day while sitting.  Since the 10/05/2013 angioplasty of his left posterior tibial artery, patient state he has had no further resting pain in his left leg.  He denies claudication symptoms in his legs with walking, denies non healing wounds.  Reports that he has 2 pinched nerves in his lumbar spine that limits his walking due to pain, his worst complaint is right low back pain with radiculopathy and RA pain. He had back surgery September, 2015, Dr. Channing Mutters, neurosurgeon. He was hospitalized at [facility] in May, 2015 for 4 days days for pneumonia, acute renal failure, dehydration, and c.diff.   He had a stroke June 24, 2015 as manifested by right hemiparesis, right hand remains weak; during the stroke he had mild  expressive aphasia but this resolved quickly, he did not seem to have monocular loss of vision during this episode. His friend with him is an [employer] fundraiser and gives this history. He is seeing a neurologist, Dr. Jeralene Huff (sp?) in [place].  He then had an MI December 2016. He then became anemic, was transfused with 2 units of blood, unable to do GI work up due to MI. RN friend with him states he had no blood in his stool, no occult blood. RN states he had ecchymotic areas on both flanks during his December 2016 hospitalization at [facility], this resolved per friend, states Dr. Gerda Diss, pt's PCP, saw this while he was hospitalized.   Today she noticed a larger ecchymotic area on his left flank only. Pt denies falling, denies any injury.    Pt Diabetic: "borderline", no DM medications  Pt smoker: former smoker, quit in 1967   Pt meds include:  Statin :Yes  Betablocker: Yes  ASA: Yes  Other anticoagulants/antiplatelets: no    Past Medical History  Diagnosis Date  . 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 Hyperlipidemia   . Hypertension   . PVD (peripheral vascular disease) (HCC)     Left iliac PCI/stent  . Cerebrovascular disease   . Tobacco abuse, in remission     Remote  . GERD (gastroesophageal reflux disease)   . Hypothyroid   . Allergic rhinitis   . Chronic lung disease     ?  Rheumatoid lung; ?  Adverse reaction to methotrexate  . Schatzki's ring   . Hx of Clostridium difficile infection     Recurrent; associated  40 pound weight loss  . Achalasia   . Diabetes mellitus     borderline no meds  . Atrial fibrillation (HCC)   . Chronic back pain   . 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 Cervical spondylosis   . DDD (degenerative disc disease), lumbar   . Pneumonia   . Carotid artery occlusion   . Nephrolithiasis 2010    s/p lithotripsy  . Hiatal hernia     Social History Social History  Substance Use Topics  . Smoking status: Former  Smoker -- 0.50 packs/day for 5 years    Types: Cigarettes    Start date: 09/23/1956    Quit date: 09/23/1965  . Smokeless tobacco: Former Neurosurgeon  . Alcohol Use: No    Family History Family History  Problem Relation Age of Onset  . Stomach cancer Mother 48  . Cancer Mother     Stomach  . Heart disease Mother   . Heart attack Mother   . Heart attack Father   . Heart disease Father   . Heart disease Brother   . Leukemia Brother   . Lung disease Son     infant  . Lung disease Daughter     infant  . Colon cancer Neg Hx     Past Surgical History  Procedure Laterality Date  . Coronary artery bypass graft      x6 In 1998  . Total knee arthroplasty      Right  . Ankle fusion      Left  . Wrist fusion      Right  . Shoulder surgery    . Colonoscopy  09/12/2011    Dr. Elly Modena hemorrhoids, normal colon and distal terminal ileum. Random bx negative. Stool for CDiff positive.  . Esophagogastroduodenoscopy  09/13/2011    Dr. Lyndle Herrlich plawues mid-esophagus KOH negative. Distal esophageal ring and ulcer. Mild gastritis. duodenal diverticulum. Savaory dilation 67mm.   Gaspar Bidding dilation  09/13/2011  . Esophageal manometry  09/30/2011    Procedure: ESOPHAGEAL MANOMETRY (EM);  Surgeon: Rob Bunting, MD;  Location: [facility] ENDOSCOPY;  Service: Endoscopy;  Laterality: N/A;  . Esophagogastroduodenoscopy  09/12/2011    Dr. Rinaldo Ratel rings s/p dilation  . Cardiac surgery    . Esophagomyotomy  11/12/11    [facility]- with DOR antireflux surgery  . Esophagogastroduodenoscopy  06/25/2012    Dr. Jena Gauss: erosive reflux esophagitis, patent esophagus s/p malony dilation, gastric erosions/ulcerations consistent with NSAID gastropathy, neg H.pylori   . Savory dilation  06/25/2012    Procedure: SAVORY DILATION;  Surgeon: Corbin Ade, MD;  Location: [facility] ENDO SUITE;  Service: Endoscopy;  Laterality: N/A;  Elease Hashimoto dilation  06/25/2012    Procedure: Elease Hashimoto DILATION;  Surgeon: Corbin Ade,  MD;  Location: [facility] SUITE;  Service: Endoscopy;  Laterality: N/A;  . Ankle fusion  09/01/2012    Procedure: ANKLE FUSION;  Surgeon: Valeria Batman, MD;  Location: [facility] OR;  Service: Orthopedics;  Laterality: Left;  Take down of angulated tibial/fibula Fractures  . Femoral-tibial bypass graft Left 01/28/2013    Procedure: BYPASS GRAFT FEMORAL-TIBIAL ARTERY;  Surgeon: Nada Libman, MD;  Location: [facility] OR;  Service: Vascular;  Laterality: Left;  Ultrasound Guided  . Amputation Left 01/28/2013    Procedure: AMPUTATION LEFT 5TH TOE;  Surgeon: Nada Libman, MD;  Location: [facility] OR;  Service: Vascular;  Laterality: Left;  . Spine surgery  11-03-13  . Abdominal aortagram N/A 01/01/2013    Procedure: ABDOMINAL AORTAGRAM;  Surgeon: Sherren Kerns, MD;  Location: [facility] CATH LAB;  Service: Cardiovascular;  Laterality: N/A;  . Abdominal aortagram N/A 10/05/2013    Procedure: ABDOMINAL AORTAGRAM;  Surgeon: Nada Libman, MD;  Location: [facility] CATH LAB;  Service: Cardiovascular;  Laterality: N/A;  . Lower extremity angiogram Left 10/05/2013    Procedure: LOWER EXTREMITY ANGIOGRAM;  Surgeon: Nada Libman, MD;  Location: [facility] CATH LAB;  Service: Cardiovascular;  Laterality: Left;  . Abdominal aortagram N/A 11/01/2014    Procedure: ABDOMINAL Ronny Flurry;  Surgeon: Nada Libman, MD;  Location: [facility] CATH LAB;  Service: Cardiovascular;  Laterality: N/A;  . Joint replacement Right 1999    Knee  . Toe amputation Left 09/05/2014    Removed left fifth toe  . Cataract extraction w/phaco Left 12/22/2014    Procedure: CATARACT EXTRACTION PHACO AND INTRAOCULAR LENS PLACEMENT (IOC);  Surgeon: Gemma Payor, MD;  Location: [facility] ORS;  Service: Ophthalmology;  Laterality: Left;  CDE:11.89  . Cataract extraction w/phaco Right 01/30/2015    Procedure: CATARACT EXTRACTION PHACO AND INTRAOCULAR LENS PLACEMENT RIGHT EYE;  Surgeon: Gemma Payor, MD;  Location: [facility] ORS;  Service: Ophthalmology;  Laterality: Right;  CDE:10.56  . Eye surgery Left December 22, 2014    Cataract  . Eye surgery Right December 31, 2014    Cataract    Allergies  Allergen Reactions  . Clindamycin/Lincomycin Other (See Comments)    Kidney Failure  . Amoxicillin Hives and Other (See Comments)    Hallucinations   . Ciprofloxacin Other (See Comments)    C-diff  . Other Hives, Diarrhea and Other (See Comments)    "Mycin" Causes C-diff  . Ceftin [Cefuroxime Axetil]     Taste like rusty iron, nausea,  . Doxycycline Nausea And Vomiting  . Levofloxacin Nausea And Vomiting    Current Outpatient Prescriptions  Medication Sig Dispense Refill  . ALPRAZolam (XANAX) 1 MG tablet TAKE 1 TABLET BY MOUTH ONCE DAILY AS NEEDED FOR ANXIETY. 30 tablet 5  . aspirin 81 MG chewable tablet Chew 1 tablet (81 mg total) by mouth daily.    Marland Kitchen atenolol (TENORMIN) 25 MG tablet TAKE (1) TABLET BY MOUTH TWICE DAILY. 60 tablet 5  . atorvastatin (LIPITOR) 80 MG tablet TAKE ONE TABLET BY MOUTH ONCE DAILY. 30 tablet 11  . clopidogrel (PLAVIX) 75 MG tablet Take 1 tablet (75 mg total) by mouth daily. 30 tablet 5  . dexlansoprazole (DEXILANT) 60 MG capsule Take 1 capsule (60 mg total) by mouth daily. 90 capsule 3  . finasteride (PROSCAR) 5 MG tablet Take 5 mg by mouth daily.    . folic acid (FOLVITE) 1 MG tablet Take 1 mg by mouth daily.    . hydrALAZINE (APRESOLINE) 25 MG tablet Take 1.5 tablets (37.5 mg total) by mouth every 8 (eight) hours. 90 tablet 2  . isosorbide mononitrate (IMDUR) 30 MG 24 hr tablet Take 1 tablet (30 mg total) by mouth daily. 30 tablet 2  . levothyroxine (SYNTHROID, LEVOTHROID) 88 MCG tablet TAKE ONE TABLET BY MOUTH ONCE DAILY. 30 tablet 5  . lidocaine (LIDODERM) 5 % Apply one patch up to 12 hours to affected area. 30 patch 0  . losartan (COZAAR) 25 MG tablet Take 1 tablet (25 mg total) by mouth daily. 30 tablet 3  . Melatonin 3 MG TABS Take 3-6 mg by mouth at bedtime as needed.     . Multiple Vitamin (MULTIVITAMIN) capsule Take 1 capsule by mouth daily.    . pantoprazole  (PROTONIX) 40 MG  tablet Take 1 tablet (40 mg total) by mouth 2 (two) times daily before a meal. 60 tablet 2  . predniSONE (DELTASONE) 10 MG tablet TAKE ONE TABLET BY MOUTH ONCE DAILY. 30 tablet 11  . tamsulosin (FLOMAX) 0.4 MG CAPS capsule Take 0.4 mg by mouth daily.    . Vitamin D, Ergocalciferol, (DRISDOL) 50000 UNITS CAPS capsule Take 50,000 Units by mouth every 7 (seven) days. On Monday.     No current facility-administered medications for this visit.    ROS: See HPI for pertinent positives and negatives.   Physical Examination  Filed Vitals:   10/19/15 1422  BP: 119/63  Pulse: 84  Temp: 97.7 F (36.5 C)  TempSrc: Oral  Resp: 16  Height: 5\' 6"  (1.676 m)  Weight: 136 lb (61.689 kg)  SpO2: 96%   Body mass index is 21.96 kg/(m^2).  General: A&O x 3, WDWN  Gait: slow, deliberate Eyes: Pupils are equal. Pale mucous membranes.   Pulmonary: respirations are non labored, decreased air movement left posterior fields Cardiac: regular Rythm with a few premature contractions  Carotid Bruits  Left  Right    Negative  Positive   Aorta: is not palpable  Radial pulses: are 2+ and =   VASCULAR EXAM:  Extremities without ischemic changes  without Gangrene; without open wounds. Right great toe is no longer red. Left 5th toe is surgically absent.   LE Pulses  LEFT  RIGHT   FEMORAL  palpable  palpable   POPLITEAL  not palpable  not palpable   POSTERIOR TIBIAL  not palpable  1+ palpable  DORSALIS PEDIS  ANTERIOR TIBIAL  faintly palpable, monophasic by Doppler  faintly palpable, monophasic by Doppler    Abdomen: soft, NT, no palpable masses.  Skin: no rashes, no ulcers. Large ecchymotic area lower left flank. Musculoskeletal: some generalized muscle atrophy, RA deformities noted in hands, severe kyphosis, left leg shorter than right, both ankles with limited ROM.  Neurologic: A&O X 3; Appropriate Affect,  MOTOR FUNCTION: moving all extremities equally, motor strength 4/5 throughout. Speech is fluent/normal.  CN 2-12 intact.               Non-Invasive Vascular Imaging: DATE: 10/19/2015 LOWER EXTREMITY ARTERIAL DUPLEX EVALUATION    INDICATION: Peripheral vascular disease     PREVIOUS INTERVENTION(S): Left distal superficial femoral to distal posterior tibial artery bypass graft 01/28/2013.    DUPLEX EXAM:     RIGHT  LEFT   Peak Systolic Velocity (cm/s) Ratio (if abnormal) Waveform  Peak Systolic Velocity (cm/s) Ratio (if abnormal) Waveform     Inflow Artery 76  B     Proximal Anastomosis 77  B     Proximal Graft 62  B     Mid Graft 165  B      Distal Graft 432 2.61 B     Distal Anastomosis 109  B     Outflow Artery 113  M    Non-compressible Today's ABI / TBI Non-compressible   Previous ABI / TBI (  )     Waveform:    M - Monophasic       B - Biphasic       T - Triphasic  If Ankle Brachial Index (ABI) or Toe Brachial Index (TBI) performed, please see complete report     ADDITIONAL FINDINGS: Right distal superficial femoral artery stenosis remains in the 75-99% category of disease. 30-49% right popliteal artery stenosis.    IMPRESSION: Patent left bypass graft with Doppler  velocities suggestive of a 50-99% stenosis of the distal bypass graft at the mid-calf level; possibly due to a retained valve.    Compared to the previous exam:  Continued high grade stenosis of the right distal superficial femoral are popliteal arteries. Increased velocity of the left distal graft compared to the last exam.      ASSESSMENT: [name] is a 70 y.o. male who is status post distal left superficial femoral to posterior tibial artery bypass graft with ipsilateral non-reversed, translocated saphenous vein, and left fifth toe amputation with metatarsal head on 01/28/2013. This was done in the setting of an ischemic left fifth toe. He then had left PTA of posterior tibial artery on 10/05/13.   Today's left LE arterial duplex suggests right distal superficial femoral artery stenosis remains in the 75-99% category of disease. 30-49% right popliteal artery stenosis. Left bypass graft with Doppler velocities suggestive of a 50-99% stenosis of the distal bypass graft at the mid-calf level; possibly due to a retained valve. Continued high grade stenosis of the right distal superficial femoral are popliteal arteries. Increased velocity of the left distal graft compared to the last exam.  ABI;'s remain stable with non-compressible vessels, bi and monophasic waveforms. Toe pressures remain stable: 64 on the right, 81 on the left.  On 11/01/14 Dr. Myra Gianotti performed an aortogram to evaluate bypass graft stenosis. Dr. Estanislado Spire impressions were as follows:  #1 lesion identified on ultrasound was not hemodynamically significant on angiography. The bypass graft on the left leg is widely patent. #2 80% stenosis on the right leg at the adductor canal. #3 high-grade stenosis in the posterior tibial artery on the right  He has no signs of ischemia in his feet or legs, no ulcers. He has no claudication sx's with walking.  He has severe RA and lumbar spine issues with pain.  He had a stroke in October, 2016. Residual neurological deficit is mild right hand weakness. He had an MI in December 2016. During his December 2016 hospitalization his PCP and RN friend with him noted ecchymotic areas in both flanks, smaller than today's large ecchymotic area at lower left flank. In both instances he denies any known injury.   The ecchymotic areas noted in December resolved spontaneously. He was also anemic and was transfused with 2 units of blood. His RN friend with him states that he had no frank red blood from his rectum and no occult blood; denies hematemesis.  He was going to have endoscopies but these were cancelled.   Carotid Duplex due July 2017.  I advised pt  to call his PCP today re the large ecchymotic area at his left flank.  I discussed pt HPI, physical exam from today, and left LE arterial duplex result from today with Dr. Darrick Penna, see Plan.  Carotid Duplex due July 2017.   Face to face time with patient was 25 minutes. Over 50% of this time was spent on counseling and coordination of care.   PLAN:  Based on the patient's vascular studies and examination, pt will return to clinic in 1-2  weeks to discuss with Dr. Myra Gianotti the best approach to address his asymptomatic left distal graft stenosis, velocity of 432 cm/s.   I discussed in depth with the patient the nature of atherosclerosis, and emphasized the importance of maximal medical management including strict control of blood pressure, blood glucose, and lipid levels, obtaining regular exercise, and continued cessation of smoking.  The patient is aware that without maximal medical management  the underlying atherosclerotic disease process will progress, limiting the benefit of any interventions.  The patient was given information about PAD including signs, symptoms, treatment, what symptoms should prompt the patient to seek immediate medical care, and risk reduction measures to take.  Charisse March, RN, MSN, FNP-C [facility] [place] Phone: [phone]  Clinic MD: Darrick Penna  10/19/2015 2:26 PM

## 2015-10-19 NOTE — Telephone Encounter (Signed)
Pt.notified

## 2015-10-19 NOTE — H&P (Signed)
Patient Demographics  [name], is a 70 y.o. male  MRN: [id number]   DOB - [birth year]  Admit Date - 10/19/2015  Outpatient Primary MD for the patient is Lubertha South, MD   With History of -  Past Medical History  Diagnosis Date  . 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 Hyperlipidemia   . Hypertension   . PVD (peripheral vascular disease) (HCC)     Left iliac PCI/stent  . Cerebrovascular disease   . Tobacco abuse, in remission     Remote  . GERD (gastroesophageal reflux disease)   . Hypothyroid   . Allergic rhinitis   . Chronic lung disease     ?  Rheumatoid lung; ?  Adverse reaction to methotrexate  . Schatzki's ring   . Hx of Clostridium difficile infection     Recurrent; associated 40 pound weight loss  . Achalasia   . Diabetes mellitus     borderline no meds  . Atrial fibrillation (HCC)   . Chronic back pain   . 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 Cervical spondylosis   . DDD (degenerative disc disease), lumbar   . Pneumonia   . Carotid artery occlusion   . Nephrolithiasis 2010    s/p lithotripsy  . Hiatal hernia       Past Surgical History  Procedure Laterality Date  . Coronary artery bypass graft      x6 In 1998  . Total knee arthroplasty      Right  . Ankle fusion      Left  . Wrist fusion      Right  . Shoulder surgery    . Colonoscopy  09/12/2011    Dr. Elly Modena hemorrhoids, normal colon and distal terminal ileum. Random bx negative. Stool for CDiff positive.  . Esophagogastroduodenoscopy  09/13/2011    Dr. Lyndle Herrlich plawues mid-esophagus KOH negative. Distal esophageal ring and ulcer. Mild gastritis. duodenal diverticulum. Savaory dilation 16mm.   Gaspar Bidding dilation  09/13/2011  . Esophageal manometry  09/30/2011    Procedure: ESOPHAGEAL MANOMETRY (EM);  Surgeon: Rob Bunting, MD;  Location: [facility];  Service: Endoscopy;  Laterality: N/A;  . Esophagogastroduodenoscopy  09/12/2011    Dr. Rinaldo Ratel rings s/p dilation  . Cardiac surgery    . Esophagomyotomy  11/12/11    [facility]- with DOR antireflux surgery  . Esophagogastroduodenoscopy  06/25/2012    Dr. Jena Gauss: erosive reflux esophagitis, patent esophagus s/p malony dilation, gastric erosions/ulcerations consistent with NSAID gastropathy, neg H.pylori   . Savory dilation  06/25/2012    Procedure: SAVORY DILATION;  Surgeon: Corbin Ade, MD;  Location: [facility] SUITE;  Service: Endoscopy;  Laterality: N/A;  Elease Hashimoto dilation  06/25/2012    Procedure: Elease Hashimoto DILATION;  Surgeon: Corbin Ade, MD;  Location: [facility];  Service: Endoscopy;  Laterality: N/A;  . Ankle fusion  09/01/2012    Procedure: ANKLE FUSION;  Surgeon: Valeria Batman, MD;  Location: [facility] OR;  Service: Orthopedics;  Laterality: Left;  Take down of angulated tibial/fibula Fractures  . Femoral-tibial bypass graft Left 01/28/2013    Procedure: BYPASS GRAFT FEMORAL-TIBIAL ARTERY;  Surgeon: Nada Libman, MD;  Location: [facility] OR;  Service: Vascular;  Laterality: Left;  Ultrasound Guided  . Amputation Left 01/28/2013    Procedure: AMPUTATION LEFT 5TH TOE;  Surgeon: Nada Libman, MD;  Location: [facility] OR;  Service: Vascular;  Laterality: Left;  . Spine surgery  11-03-13  .  Abdominal aortagram N/A 01/01/2013    Procedure: ABDOMINAL Ronny Flurry;  Surgeon: Sherren Kerns, MD;  Location: [facility] CATH LAB;  Service: Cardiovascular;  Laterality: N/A;  . Abdominal aortagram N/A 10/05/2013    Procedure: ABDOMINAL AORTAGRAM;  Surgeon: Nada Libman, MD;  Location: [facility] CATH LAB;  Service: Cardiovascular;  Laterality: N/A;  . Lower extremity angiogram Left 10/05/2013    Procedure: LOWER EXTREMITY ANGIOGRAM;  Surgeon: Nada Libman, MD;  Location: [facility] CATH LAB;  Service: Cardiovascular;  Laterality: Left;  . Abdominal aortagram N/A 11/01/2014    Procedure: ABDOMINAL Ronny Flurry;  Surgeon: Nada Libman, MD;  Location: [facility] CATH LAB;  Service: Cardiovascular;  Laterality: N/A;  .  Joint replacement Right 1999    Knee  . Toe amputation Left 09/05/2014    Removed left fifth toe  . Cataract extraction w/phaco Left 12/22/2014    Procedure: CATARACT EXTRACTION PHACO AND INTRAOCULAR LENS PLACEMENT (IOC);  Surgeon: Gemma Payor, MD;  Location: [facility] ORS;  Service: Ophthalmology;  Laterality: Left;  CDE:11.89  . Cataract extraction w/phaco Right 01/30/2015    Procedure: CATARACT EXTRACTION PHACO AND INTRAOCULAR LENS PLACEMENT RIGHT EYE;  Surgeon: Gemma Payor, MD;  Location: [facility] ORS;  Service: Ophthalmology;  Laterality: Right;  CDE:10.56  . Eye surgery Left December 22, 2014    Cataract  . Eye surgery Right December 31, 2014    Cataract    in for   Chief Complaint  Patient presents with  . Bleeding/Bruising  . Shortness of Breath     HPI  [name]  is a 70 y.o. male, with past medical history for acute CVA in October 2016, non-STEMI in December 2016, hyperlipidemia, pulmonary fibrosis, rheumatoid arthritis recently started on xeljanz on chronic steroid with secondary as a insufficiency, A. fib, hypertension, former smoker, presents with complaint of left flank bruise, patient reports he noticed this bruising this a.m. denies any trauma, or any pain at that site, recently seen by GI for follow-up appointment on 1/24, workup on 1/25 showing stable hemoglobin at 10.1, workup in ED significant for anemia with hemoglobin of 8.8, patient was Hemoccult negative in ED, he denies any melena, bright red blood per rectum, or coffee-ground emesis. - As well patient reports shortness of breath, upon further question, reports it's at its baseline, his known history of pulmonary fibrosis, agent not hypoxic in ED, hospitalist requested to admit for further evaluation for his worsening anemia, bruise.    Review of Systems    In addition to the HPI above, No Fever-chills, No Headache, No changes with Vision or hearing, No problems swallowing food or Liquids, No Chest pain,or  Cough , his Shortness  of Breath at baseline., No Abdominal pain, No Nausea or Vommitting, Bowel movements are regular, No Blood in stool or Urine, No dysuria, No new skin rashes , significant large flank bruise when he woke up this a.m.. No new joints pains-aches,  No new weakness, tingling, numbness in any extremity, No recent weight gain or loss, No polyuria, polydypsia or polyphagia, No significant Mental Stressors.  A full 10 point Review of Systems was done, except as stated above, all other Review of Systems were negative.   Social History Social History  Substance Use Topics  . Smoking status: Former Smoker -- 0.50 packs/day for 5 years    Types: Cigarettes    Start date: 09/23/1956    Quit date: 09/23/1965  . Smokeless tobacco: Former Neurosurgeon  . Alcohol Use: No     Family History Family  History  Problem Relation Age of Onset  . Stomach cancer Mother 70  . Cancer Mother     Stomach  . Heart disease Mother   . Heart attack Mother   . Heart attack Father   . Heart disease Father   . Heart disease Brother   . Leukemia Brother   . Lung disease Son     infant  . Lung disease Daughter     infant  . Colon cancer Neg Hx      Prior to Admission medications   Medication Sig Start Date End Date Taking? Authorizing Provider  ALPRAZolam (XANAX) 1 MG tablet TAKE 1 TABLET BY MOUTH ONCE DAILY AS NEEDED FOR ANXIETY. Patient taking differently: TAKE 1 TABLET BY MOUTH ONCE DAILY AT BEDTIME 05/11/15  Yes Merlyn Albert, MD  aspirin 81 MG chewable tablet Chew 1 tablet (81 mg total) by mouth daily. 09/08/15  Yes Estela Isaiah Blakes, MD  atenolol (TENORMIN) 25 MG tablet TAKE (1) TABLET BY MOUTH TWICE DAILY. 04/12/15  Yes Merlyn Albert, MD  atorvastatin (LIPITOR) 80 MG tablet TAKE ONE TABLET BY MOUTH ONCE DAILY. 08/14/15  Yes Antoine Poche, MD  clopidogrel (PLAVIX) 75 MG tablet Take 1 tablet (75 mg total) by mouth daily. 06/26/15  Yes Houston Siren, MD  finasteride (PROSCAR) 5 MG tablet Take 5 mg  by mouth daily.   Yes Historical Provider, MD  folic acid (FOLVITE) 1 MG tablet Take 1 mg by mouth daily.   Yes Historical Provider, MD  furosemide (LASIX) 20 MG tablet Take 20 mg by mouth daily as needed for fluid.   Yes Historical Provider, MD  hydrALAZINE (APRESOLINE) 25 MG tablet Take 1.5 tablets (37.5 mg total) by mouth every 8 (eight) hours. 09/07/15  Yes Henderson Cloud, MD  HYDROcodone-acetaminophen [facility]) 10-325 MG tablet Take 1 tablet by mouth 4 (four) times daily. *May take one additional tablet daily as needed for pain   Yes Historical Provider, MD  isosorbide mononitrate (IMDUR) 30 MG 24 hr tablet Take 1 tablet (30 mg total) by mouth daily. 09/07/15  Yes Estela Isaiah Blakes, MD  levothyroxine (SYNTHROID, LEVOTHROID) 88 MCG tablet TAKE ONE TABLET BY MOUTH ONCE DAILY. 09/14/15  Yes Merlyn Albert, MD  losartan (COZAAR) 25 MG tablet Take 1 tablet (25 mg total) by mouth daily. 07/05/15  Yes Antoine Poche, MD  Melatonin 3 MG TABS Take 3-6 mg by mouth at bedtime as needed.    Yes Historical Provider, MD  Multiple Vitamin (MULTIVITAMIN) capsule Take 1 capsule by mouth daily.   Yes Historical Provider, MD  pantoprazole (PROTONIX) 40 MG tablet Take 1 tablet (40 mg total) by mouth 2 (two) times daily before a meal. 09/07/15  Yes Estela Isaiah Blakes, MD  predniSONE (DELTASONE) 10 MG tablet TAKE ONE TABLET BY MOUTH ONCE DAILY. 04/04/15  Yes Merlyn Albert, MD  Probiotic Product (FLORAJEN3 PO) Take 1 capsule by mouth at bedtime.   Yes Historical Provider, MD  tamsulosin (FLOMAX) 0.4 MG CAPS capsule Take 0.4 mg by mouth every evening.    Yes Historical Provider, MD  Tofacitinib Citrate (XELJANZ) 5 MG TABS Take 5 mg by mouth daily as needed.   Yes Historical Provider, MD  Vitamin D, Ergocalciferol, (DRISDOL) 50000 UNITS CAPS capsule Take 50,000 Units by mouth every 7 (seven) days. On Monday.   Yes Historical Provider, MD  dexlansoprazole (DEXILANT) 60 MG capsule Take 1  capsule (60 mg total) by mouth daily. Patient not  taking: Reported on 10/19/2015 10/17/15   Nira Retort, NP  lidocaine (LIDODERM) 5 % Apply one patch up to 12 hours to affected area. Patient not taking: Reported on 10/19/2015 07/26/15   Merlyn Albert, MD    Allergies  Allergen Reactions  . Clindamycin/Lincomycin Other (See Comments)    Kidney Failure  . Amoxicillin Hives and Other (See Comments)    Hallucinations   . Ciprofloxacin Other (See Comments)    C-diff  . Other Hives, Diarrhea and Other (See Comments)    "Mycin" Causes C-diff  . Ceftin [Cefuroxime Axetil]     Taste like rusty iron, nausea,  . Doxycycline Nausea And Vomiting  . Levofloxacin Nausea And Vomiting    Physical Exam  Vitals  Blood pressure 129/77, pulse 70, temperature 97.9 F (36.6 C), temperature source Oral, resp. rate 17, height 5\' 6"  (1.676 m), weight 61.236 kg (135 lb), SpO2 95 %.   1. General elderly male lying in bed in NAD,    2. Normal affect and insight, Not Suicidal or Homicidal, Awake Alert, Oriented X 3.  3. No F.N deficits, ALL C.Nerves Intact, Strength 5/5 all 4 extremities, Sensation intact all 4 extremities, Plantars down going.  4. Ears and Eyes appear Normal, Conjunctivae clear, PERRLA. Moist Oral Mucosa.  5. Supple Neck, No JVD, No cervical lymphadenopathy appriciated, No Carotid Bruits.  6. Symmetrical Chest wall movement, Good air movement bilaterally, CTAB.  7. RRR, No Gallops, Rubs or Murmurs, No Parasternal Heave.  8. Positive Bowel Sounds, Abdomen Soft, No tenderness, No organomegaly appriciated,No rebound -guarding or rigidity, significant large patchy area of ecchymosis and left flank, nontender.  9.  No Cyanosis, Normal Skin Turgor, No Skin Rash or Bruise.  10. Good muscle tone,  joints appear normal , no effusions, Normal ROM.  11. No Palpable Lymph Nodes in Neck or Axillae    Data Review  CBC  Recent Labs Lab 10/18/15 1521 10/19/15 1915  WBC 12.4* 9.7    HGB  --  8.8*  HCT 32.3* 27.7*  PLT 220 175  MCV 95 97.9  MCH 29.6 31.1  MCHC 31.3* 31.8  RDW 18.4* 18.8*   ------------------------------------------------------------------------------------------------------------------  Chemistries   Recent Labs Lab 10/19/15 1915  NA 143  K 3.7  CL 114*  CO2 21*  GLUCOSE 151*  BUN 21*  CREATININE 1.41*  CALCIUM 7.8*  AST 26  ALT 16*  ALKPHOS 80  BILITOT 0.6   ------------------------------------------------------------------------------------------------------------------ estimated creatinine clearance is 42.2 mL/min (by C-G formula based on Cr of 1.41). ------------------------------------------------------------------------------------------------------------------ No results for input(s): TSH, T4TOTAL, T3FREE, THYROIDAB in the last 72 hours.  Invalid input(s): FREET3   Coagulation profile  Recent Labs Lab 10/19/15 1915  INR 1.07   ------------------------------------------------------------------------------------------------------------------- No results for input(s): DDIMER in the last 72 hours. -------------------------------------------------------------------------------------------------------------------  Cardiac Enzymes  Recent Labs Lab 10/19/15 1915  TROPONINI 0.03   ------------------------------------------------------------------------------------------------------------------ Invalid input(s): POCBNP   ---------------------------------------------------------------------------------------------------------------  Urinalysis    Component Value Date/Time   COLORURINE YELLOW 09/04/2015 0927   APPEARANCEUR CLEAR 09/04/2015 0927   LABSPEC 1.025 09/04/2015 0927   PHURINE 6.0 09/04/2015 0927   GLUCOSEU NEGATIVE 09/04/2015 0927   HGBUR LARGE* 09/04/2015 0927   BILIRUBINUR NEGATIVE 09/04/2015 0927   KETONESUR 15* 09/04/2015 0927   PROTEINUR 30* 09/04/2015 0927   PROTEINUR 30 01/11/2015 1312    UROBILINOGEN 1.0 02/04/2014 1820   NITRITE NEGATIVE 09/04/2015 0927   LEUKOCYTESUR NEGATIVE 09/04/2015 0927    ----------------------------------------------------------------------------------------------------------------  Imaging results:   Dg Chest 2 View  10/19/2015  CLINICAL DATA:  Bruising around left lying. Shortness of breath since Tuesday. EXAM: CHEST  2 VIEW COMPARISON:  Chest x-ray dated 09/02/2015. FINDINGS: Cardiomegaly is stable. The probable mild scarring/fibrosis within each lung is unchanged. No new lung findings. No confluent airspace opacity to suggest a developing pneumonia. No pleural effusion seen. No pneumothorax seen. Median sternotomy wires are stable in alignment and configuration. There appear to be slightly displaced fractures of the right lateral seventh and eighth ribs, of uncertain age but most likely chronic. No left-sided rib fracture identified. Mild degenerative change again noted within the thoracolumbar spine. IMPRESSION: 1. Cardiomegaly, stable. No evidence of significant volume overload/CHF. 2. Bilateral scarring/fibrosis, stable. No acute lung findings seen. No evidence of pneumonia. No pleural effusion. No pneumothorax seen. 3. Slightly displaced fracture within the right lateral seventh and/or eighth ribs. This is of uncertain age but most likely old. Any right-sided rib pain? No left-sided rib fracture seen. Electronically Signed   By: Bary Richard M.D.   On: 10/19/2015 19:46       Assessment & Plan  Active Problems:   Hyperlipidemia   Cardiovascular disease   Cerebrovascular disease   Hypertension   Rheumatoid arthritis (HCC)   Diabetes mellitus (HCC)   Secondary adrenal insufficiency (HCC)   Anemia  Anemia - known history of chronic anemia, known history of rheumatoid arthritis,  hemoglobin 10.1 yesterday, today is 8.8, given his drop in 24 hours, patient will be admitted for observation, there is no evidence of GI bleed as  Hemoccult-negative, no evidence of overt bleeding, CT abdomen and pelvis without contrast pending, left flank bruise might explain this drop, and has tendency for easy bruising given he is on aspirin and Plavix given his recent CVA and non-STEMI. - Recheck his CBC in a.m., hemoglobin remained stable at baseline, will resume back on aspirin and Plavix. - We'll check anemia panel  Left flank bruising - Patient is on dual antiplatelet therapy, high risk for bruising. - If CBC stable in a.m., will resume back on aspirin and Plavix given his recent history of acute CVA and non-STEMI - CT abdomen pelvis pending for further evaluation of bruising, straight without evidence of seventh rib fracture on the right side while bruising to the left side, the fracture most likely related to follow on previous admission in December 2016 where he presents with syncope.  Coronary artery disease - With recent non-STEMI, and resume aspirin and Plavix in a.m. of hemoglobin remained stable, continue with statin, continue with beta blockers, continue with lisinopril, continue with Imdur  CVA - We'll resume aspirin and Plavix in a.m. of hemoglobin remained stable  Hypertension - Acceptable, continue with home meds  Rheumatoid arthritis - Continue to hold Harriette Ohara given his anemia, continue with prednisone  History of diabetes mellitus - Take any home medication, monitor CBG.  Adrenal insufficiency - Vital signs stable, continue with home dose prednisone  BPH - Continue with Proscar and Flomax   DVT Prophylaxis SCDs   AM Labs Ordered, also please review Full Orders  Family Communication: Admission, patients condition and plan of care including tests being ordered have been discussed with the patient and multiple family members at bedside who indicate understanding and agree with the plan and Code Status.  Code Status Full code  Likely DC to  Home when stable  Condition GUARDED   Time spent in minutes  :55 minutes    ELGERGAWY, DAWOOD M.D on 10/19/2015 at 10:08 PM  Between 7am to 7pm -  Pager - [phone]  After 7pm go to [email or web address] - password [email or web address]  And look for the night coverage person covering me after hours  [facility] Office  [phone]

## 2015-10-20 ENCOUNTER — Telehealth: Payer: Self-pay | Admitting: Family Medicine

## 2015-10-20 ENCOUNTER — Encounter: Payer: Self-pay | Admitting: Surgery

## 2015-10-20 DIAGNOSIS — S301XXD Contusion of abdominal wall, subsequent encounter: Secondary | ICD-10-CM | POA: Diagnosis not present

## 2015-10-20 DIAGNOSIS — S301XXA Contusion of abdominal wall, initial encounter: Secondary | ICD-10-CM

## 2015-10-20 DIAGNOSIS — D62 Acute posthemorrhagic anemia: Secondary | ICD-10-CM | POA: Diagnosis not present

## 2015-10-20 DIAGNOSIS — M7981 Nontraumatic hematoma of soft tissue: Secondary | ICD-10-CM | POA: Diagnosis not present

## 2015-10-20 LAB — CBC
HCT: 30.4 % — ABNORMAL LOW (ref 39.0–52.0)
HEMOGLOBIN: 9.8 g/dL — AB (ref 13.0–17.0)
MCH: 31.3 pg (ref 26.0–34.0)
MCHC: 32.2 g/dL (ref 30.0–36.0)
MCV: 97.1 fL (ref 78.0–100.0)
PLATELETS: 155 10*3/uL (ref 150–400)
RBC: 3.13 MIL/uL — ABNORMAL LOW (ref 4.22–5.81)
RDW: 18.6 % — ABNORMAL HIGH (ref 11.5–15.5)
WBC: 9.9 10*3/uL (ref 4.0–10.5)

## 2015-10-20 LAB — GLUCOSE, CAPILLARY
GLUCOSE-CAPILLARY: 80 mg/dL (ref 65–99)
GLUCOSE-CAPILLARY: 87 mg/dL (ref 65–99)
GLUCOSE-CAPILLARY: 133 mg/dL — AB (ref 65–99)

## 2015-10-20 LAB — FERRITIN: Ferritin: 340 ng/mL — ABNORMAL HIGH (ref 24–336)

## 2015-10-20 LAB — FOLATE: FOLATE: 47.4 ng/mL (ref >5.9)

## 2015-10-20 LAB — IRON AND TIBC
IRON: 43 ug/dL — AB (ref 45–182)
SATURATION RATIOS: 18 % (ref 17.9–39.5)
TIBC: 245 ug/dL — ABNORMAL LOW (ref 250–450)
UIBC: 202 ug/dL

## 2015-10-20 LAB — BASIC METABOLIC PANEL
Anion gap: 10 (ref 5–15)
BUN: 20 mg/dL (ref 6–20)
CO2: 21 mmol/L — ABNORMAL LOW (ref 22–32)
Calcium: 7.9 mg/dL — ABNORMAL LOW (ref 8.9–10.3)
Chloride: 114 mmol/L — ABNORMAL HIGH (ref 101–111)
Creatinine, Ser: 1.36 mg/dL — ABNORMAL HIGH (ref 0.61–1.24)
GFR calc Af Amer: 59 mL/min — ABNORMAL LOW (ref >60)
GFR, EST NON AFRICAN AMERICAN: 51 mL/min — AB (ref >60)
Glucose, Bld: 80 mg/dL (ref 65–99)
POTASSIUM: 3.3 mmol/L — AB (ref 3.5–5.1)
SODIUM: 145 mmol/L (ref 135–145)

## 2015-10-20 LAB — VITAMIN B12: VITAMIN B 12: 267 pg/mL (ref 180–914)

## 2015-10-20 MED ORDER — FINASTERIDE 5 MG PO TABS
5.0000 mg | ORAL_TABLET | Freq: Every day | ORAL | Status: DC
  Administered 2015-10-20: 5 mg via ORAL
  Filled 2015-10-20 (×4): qty 1

## 2015-10-20 MED ORDER — HYDRALAZINE HCL 25 MG PO TABS
37.5000 mg | ORAL_TABLET | Freq: Three times a day (TID) | ORAL | Status: DC
  Administered 2015-10-20 (×2): 37.5 mg via ORAL
  Filled 2015-10-20 (×2): qty 2

## 2015-10-20 MED ORDER — MULTIVITAMINS PO CAPS: 1.0000 | ORAL_CAPSULE | Freq: Every day | ORAL | Status: DC

## 2015-10-20 MED ORDER — ATORVASTATIN CALCIUM 40 MG PO TABS
80.0000 mg | ORAL_TABLET | Freq: Every day | ORAL | Status: DC
  Administered 2015-10-20: 80 mg via ORAL
  Filled 2015-10-20: qty 2

## 2015-10-20 MED ORDER — VITAMIN D (ERGOCALCIFEROL) 1.25 MG (50000 UNIT) PO CAPS
50000.0000 [IU] | ORAL_CAPSULE | ORAL | Status: DC
  Filled 2015-10-20: qty 1

## 2015-10-20 MED ORDER — SACCHAROMYCES BOULARDII 250 MG PO CAPS
250.0000 mg | ORAL_CAPSULE | Freq: Two times a day (BID) | ORAL | Status: DC
  Administered 2015-10-20: 250 mg via ORAL
  Filled 2015-10-20: qty 1

## 2015-10-20 MED ORDER — VITAMIN D (ERGOCALCIFEROL) 1.25 MG (50000 UNIT) PO CAPS: 50000.0000 [IU] | ORAL_CAPSULE | ORAL | Status: DC

## 2015-10-20 MED ORDER — MELATONIN 3 MG PO TABS: 3.0000 mg | ORAL_TABLET | Freq: Every evening | ORAL | Status: DC | PRN

## 2015-10-20 MED ORDER — SODIUM CHLORIDE 0.9% FLUSH
3.0000 mL | Freq: Two times a day (BID) | INTRAVENOUS | Status: DC
  Administered 2015-10-20 (×2): 3 mL via INTRAVENOUS

## 2015-10-20 MED ORDER — FUROSEMIDE 20 MG PO TABS: 20.0000 mg | ORAL_TABLET | Freq: Every day | ORAL | Status: DC | PRN

## 2015-10-20 MED ORDER — ALPRAZOLAM 1 MG PO TABS: 1.0000 mg | ORAL_TABLET | Freq: Every evening | ORAL | Status: DC | PRN

## 2015-10-20 MED ORDER — TAMSULOSIN HCL 0.4 MG PO CAPS
0.4000 mg | ORAL_CAPSULE | Freq: Every evening | ORAL | Status: DC
  Filled 2015-10-20: qty 1

## 2015-10-20 MED ORDER — PANTOPRAZOLE SODIUM 40 MG PO TBEC
40.0000 mg | DELAYED_RELEASE_TABLET | Freq: Two times a day (BID) | ORAL | Status: DC
  Administered 2015-10-20 (×2): 40 mg via ORAL
  Filled 2015-10-20 (×2): qty 1

## 2015-10-20 MED ORDER — ADULT MULTIVITAMIN W/MINERALS CH
1.0000 | ORAL_TABLET | Freq: Every day | ORAL | Status: DC
  Administered 2015-10-20: 1 via ORAL
  Filled 2015-10-20: qty 1

## 2015-10-20 MED ORDER — HYDROCODONE-ACETAMINOPHEN 10-325 MG PO TABS
1.0000 | ORAL_TABLET | Freq: Four times a day (QID) | ORAL | Status: DC
  Administered 2015-10-20 (×2): 1 via ORAL
  Filled 2015-10-20 (×2): qty 1

## 2015-10-20 MED ORDER — LEVOTHYROXINE SODIUM 88 MCG PO TABS
88.0000 ug | ORAL_TABLET | Freq: Every day | ORAL | Status: DC
  Administered 2015-10-20: 88 ug via ORAL
  Filled 2015-10-20: qty 1

## 2015-10-20 MED ORDER — FLORAJEN3 PO CAPS: ORAL_CAPSULE | Freq: Every day | ORAL | Status: DC

## 2015-10-20 MED ORDER — PREDNISONE 10 MG PO TABS
10.0000 mg | ORAL_TABLET | Freq: Every day | ORAL | Status: DC
  Administered 2015-10-20: 10 mg via ORAL
  Filled 2015-10-20: qty 1

## 2015-10-20 MED ORDER — ATENOLOL 25 MG PO TABS
25.0000 mg | ORAL_TABLET | Freq: Every day | ORAL | Status: DC
  Administered 2015-10-20: 25 mg via ORAL
  Filled 2015-10-20: qty 1

## 2015-10-20 MED ORDER — ALBUTEROL SULFATE (2.5 MG/3ML) 0.083% IN NEBU: 2.5000 mg | INHALATION_SOLUTION | RESPIRATORY_TRACT | Status: DC | PRN

## 2015-10-20 MED ORDER — FOLIC ACID 1 MG PO TABS
1.0000 mg | ORAL_TABLET | Freq: Every day | ORAL | Status: DC
  Administered 2015-10-20: 1 mg via ORAL
  Filled 2015-10-20: qty 1

## 2015-10-20 MED ORDER — ISOSORBIDE MONONITRATE ER 60 MG PO TB24
30.0000 mg | ORAL_TABLET | Freq: Every day | ORAL | Status: DC
  Administered 2015-10-20: 30 mg via ORAL
  Filled 2015-10-20: qty 1

## 2015-10-20 MED ORDER — ASPIRIN 81 MG PO CHEW
81.0000 mg | CHEWABLE_TABLET | Freq: Every day | ORAL | Status: DC
  Administered 2015-10-20: 81 mg via ORAL
  Filled 2015-10-20: qty 1

## 2015-10-20 NOTE — Care Management Obs Status (Signed)
MEDICARE OBSERVATION STATUS NOTIFICATION   Patient Details  Name: [name] MRN: [id number] Date of Birth: 1946/09/14   Medicare Observation Status Notification Given:  Yes    Adonis Huguenin, RN 10/20/2015, 11:05 AM

## 2015-10-20 NOTE — Progress Notes (Signed)
Discharge instructions were discussed with patient, he verbalized understanding. IV's were removed and patient was transferred via wheelchair in NAD.

## 2015-10-20 NOTE — Consult Note (Signed)
   [facility] CM Inpatient Consult   10/20/2015  TROOPER OLANDER Jan 31, 1946 [id number]  Spoke with patient and wife, Jerrye Beavers and family friend, Dorna Leitz, at bedside regarding referral to [facility] Care Management program services. Patient verbalized  interest in [facility] Care Management services. Patient signed consent form and packet with information given for disease management support or other services. Patient will receive post hospital follow up calls and be assessed for home visits from registered nurse and pharmacist. Inpatient case manager made aware. Of note, [facility] Care Management services does not replace or interfere with any services that are arranged by inpatient case management or social work.  For questions, please contact: Alben Spittle. Albertha Ghee, RN, BSN, CCM  [facility] [employer] [phone]

## 2015-10-20 NOTE — Discharge Summary (Signed)
Physician Discharge Summary  JEBB AUKAMP EKB:[id number]  DOB: [birth year] DOA: 10/19/2015  PCP: Lubertha South, MD  Admit date: 10/19/2015 Discharge date: 10/20/2015  Time spent: 45 minutes  Recommendations for Outpatient Follow-up:  -We'll be discharged home today. -Advised to cease use of aspirin and Plavix for at least the next 2 weeks pending reevaluation by neurology.  Discharge Diagnoses:  Active Problems:   Hyperlipidemia   Cardiovascular disease   Cerebrovascular disease   Hypertension   Rheumatoid arthritis (HCC)   Diabetes mellitus (HCC)   Secondary adrenal insufficiency (HCC)   Anemia   Discharge Condition: Stable and improved  Filed Weights   10/19/15 1830 10/19/15 2347  Weight: 61.236 kg (135 lb) 59.9 kg (132 lb 0.9 oz)    History of present illness:  As per Dr. Randol Kern on 1/26: [name] is a 70 y.o. male, with past medical history for acute CVA in October 2016, non-STEMI in December 2016, hyperlipidemia, pulmonary fibrosis, rheumatoid arthritis recently started on xeljanz on chronic steroid with secondary as a insufficiency, A. fib, hypertension, former smoker, presents with complaint of left flank bruise, patient reports he noticed this bruising this a.m. denies any trauma, or any pain at that site, recently seen by GI for follow-up appointment on 1/24, workup on 1/25 showing stable hemoglobin at 10.1, workup in ED significant for anemia with hemoglobin of 8.8, patient was Hemoccult negative in ED, he denies any melena, bright red blood per rectum, or coffee-ground emesis. - As well patient reports shortness of breath, upon further question, reports it's at its baseline, his known history of pulmonary fibrosis, agent not hypoxic in ED, hospitalist requested to admit for further evaluation for his worsening anemia, bruise.  Hospital Course:   Left Flank Hematoma -Does not recall any specific injury to the area. -CT: Intramuscular and soft tissue hematoma  along the left flank region. -Have advised to stay off ASA and plavix for at least 2 weeks. -He has had 2 major bleeding episodes in the past 6 months: this hematoma and a GI Bleed. I believe further discussion with his neurologist regarding risk/benefit of plavix should be had prior to resumption of it.  Acute Blood Loss Anemia -2/2 flank hematoma. -No transfusions this admission. -Hb stable to slightly increased at time of DC at 9.8.  Rest of chronic conditions have been stable.  Procedures:  None   Consultations:  None  Discharge Instructions  Discharge Instructions    AMB Referral to [facility] Care Management    Complete by:  As directed   Please refer to [facility] RNCM community for transition of care program calls. Please refer to [facility] pharmacy for medication reconciliation and assistance.  Patient has had 3 admits in 6 months. Has a hx of Hypertension, severe arthritis, anemia, stroke, chronic lung disease.  Please call with any questions. Alben Spittle. Niemczura, RN, BSN, CCM  [facility] Community Care Manager [phone]  Reason for consult:  Referral to [facility] program services  Expected date of contact:  1-3 days (reserved for hospital discharges)     Diet - low sodium heart healthy    Complete by:  As directed      Increase activity slowly    Complete by:  As directed             Medication List    STOP taking these medications        aspirin 81 MG chewable tablet     clopidogrel 75 MG tablet  Commonly known as:  PLAVIX     dexlansoprazole 60 MG capsule  Commonly known as:  DEXILANT     lidocaine 5 %  Commonly known as:  LIDODERM     XELJANZ 5 MG Tabs  Generic drug:  Tofacitinib Citrate      TAKE these medications        ALPRAZolam 1 MG tablet  Commonly known as:  XANAX  TAKE 1 TABLET BY MOUTH ONCE DAILY AS NEEDED FOR ANXIETY.     atenolol 25 MG tablet  Commonly known as:  TENORMIN  TAKE (1) TABLET BY MOUTH TWICE DAILY.     atorvastatin 80 MG tablet  Commonly  known as:  LIPITOR  TAKE ONE TABLET BY MOUTH ONCE DAILY.     finasteride 5 MG tablet  Commonly known as:  PROSCAR  Take 5 mg by mouth daily.     FLORAJEN3 PO  Take 1 capsule by mouth at bedtime.     folic acid 1 MG tablet  Commonly known as:  FOLVITE  Take 1 mg by mouth daily.     furosemide 20 MG tablet  Commonly known as:  LASIX  Take 20 mg by mouth daily as needed for fluid.     hydrALAZINE 25 MG tablet  Commonly known as:  APRESOLINE  Take 1.5 tablets (37.5 mg total) by mouth every 8 (eight) hours.     HYDROcodone-acetaminophen 10-325 MG tablet  Commonly known as:  NORCO  Take 1 tablet by mouth 4 (four) times daily. *May take one additional tablet daily as needed for pain     isosorbide mononitrate 30 MG 24 hr tablet  Commonly known as:  IMDUR  Take 1 tablet (30 mg total) by mouth daily.     levothyroxine 88 MCG tablet  Commonly known as:  SYNTHROID, LEVOTHROID  TAKE ONE TABLET BY MOUTH ONCE DAILY.     losartan 25 MG tablet  Commonly known as:  COZAAR  Take 1 tablet (25 mg total) by mouth daily.     Melatonin 3 MG Tabs  Take 3-6 mg by mouth at bedtime as needed.     multivitamin capsule  Take 1 capsule by mouth daily.     pantoprazole 40 MG tablet  Commonly known as:  PROTONIX  Take 1 tablet (40 mg total) by mouth 2 (two) times daily before a meal.     predniSONE 10 MG tablet  Commonly known as:  DELTASONE  TAKE ONE TABLET BY MOUTH ONCE DAILY.     tamsulosin 0.4 MG Caps capsule  Commonly known as:  FLOMAX  Take 0.4 mg by mouth every evening.     Vitamin D (Ergocalciferol) 50000 units Caps capsule  Commonly known as:  DRISDOL  Take 50,000 Units by mouth every 7 (seven) days. On Monday.       Allergies  Allergen Reactions  . Clindamycin/Lincomycin Other (See Comments)    Kidney Failure  . Amoxicillin Hives and Other (See Comments)    Hallucinations   . Ciprofloxacin Other (See Comments)    C-diff  . Other Hives, Diarrhea and Other (See Comments)     "Mycin" Causes C-diff  . Ceftin [Cefuroxime Axetil]     Taste like rusty iron, nausea,  . Doxycycline Nausea And Vomiting  . Levofloxacin Nausea And Vomiting       Follow-up Information    Follow up with Lubertha South, MD. Schedule an appointment as soon as possible for a visit in 2 weeks.   Specialty:  Family Medicine  Contact information:   [address] [place] [address] [phone]       Follow up with [facility], KOFI, MD. Schedule an appointment as soon as possible for a visit in 2 weeks.   Specialty:  Neurology   Contact information:   [address] [place] [address] [phone]        The results of significant diagnostics from this hospitalization (including imaging, microbiology, ancillary and laboratory) are listed below for reference.    Significant Diagnostic Studies: Ct Abdomen Pelvis Wo Contrast  10/19/2015  CLINICAL DATA:  Patient woke up this morning and noticed a large bruise on the left flank area. Does not recall any recent injuries. Elevated creatinine. Anemia. EXAM: CT ABDOMEN AND PELVIS WITHOUT CONTRAST TECHNIQUE: Multidetector CT imaging of the abdomen and pelvis was performed following the standard protocol without IV contrast. COMPARISON:  09/02/2015 FINDINGS: Bronchiectasis, scarring, and emphysematous changes in the lung bases. Postoperative changes in the mediastinum. The rib small esophageal hiatal hernia. There is infiltration in the subcutaneous fatty tissues over the left posterior flank area with enlargement of the flank muscles consistent with intramuscular and soft tissue hematoma. There are healing fractures of the left posterior eleventh and twelfth ribs. Healing fractures also demonstrated in the posterior right tenth, eleventh and twelfth ribs. Acute appearing fractures in the right L2 and L3 transverse processes. Bilateral rib fractures are present on prior study. The right transverse process fractures are new. The  unenhanced appearance of the liver, spleen, pancreas, adrenal glands, inferior vena cava, and retroperitoneal lymph nodes is unremarkable. Cholelithiasis without inflammatory changes in the gallbladder. Scattered calcified granulomas in the liver. Calcifications in the renal hila bilaterally likely representing multiple stones. No hydronephrosis or hydroureter. Cyst in the lower pole of the right kidney. Diffuse parenchymal atrophy. Atherosclerotic calcification throughout the abdominal aorta and iliac arteries. No aortic aneurysm. Stomach, small bowel, and colon are decompressed. No free air or free fluid in the abdomen. Pelvis: Appendix is normal. Bladder wall is mildly thickened possibly indicating infection. Prostate gland is not enlarged. Scattered diverticula in the sigmoid colon without evidence of diverticulitis. No free or loculated pelvic fluid collections. No pelvic mass or lymphadenopathy. Scoliosis and degenerative changes in the lumbar spine. Postoperative posterior fixation from L4 to the sacrum. There is a bone lesion demonstrated in the left anterior iliac spine with bone destruction and associated soft tissue process. This was present on previous studies dating back to 2014, suggesting benign etiology. This could represent postoperative change. IMPRESSION: Intramuscular and soft tissue hematoma along the left flank region. Healing bilateral lower rib fractures were present on previous study. New fractures of the right L2 and L3 transverse processes. Old destructive soft tissue bone lesion along the left anterior iliac spine, possibly postoperative. Bronchiectasis and fibrosis in the lung bases. Cholelithiasis. Nonobstructing bilateral renal stones. Bladder wall thickening possibly indicating infection. Electronically Signed   By: Burman Nieves M.D.   On: 10/19/2015 22:27   Dg Chest 2 View  10/19/2015  CLINICAL DATA:  Bruising around left lying. Shortness of breath since Tuesday. EXAM: CHEST   2 VIEW COMPARISON:  Chest x-ray dated 09/02/2015. FINDINGS: Cardiomegaly is stable. The probable mild scarring/fibrosis within each lung is unchanged. No new lung findings. No confluent airspace opacity to suggest a developing pneumonia. No pleural effusion seen. No pneumothorax seen. Median sternotomy wires are stable in alignment and configuration. There appear to be slightly displaced fractures of the right lateral seventh and eighth ribs, of uncertain  age but most likely chronic. No left-sided rib fracture identified. Mild degenerative change again noted within the thoracolumbar spine. IMPRESSION: 1. Cardiomegaly, stable. No evidence of significant volume overload/CHF. 2. Bilateral scarring/fibrosis, stable. No acute lung findings seen. No evidence of pneumonia. No pleural effusion. No pneumothorax seen. 3. Slightly displaced fracture within the right lateral seventh and/or eighth ribs. This is of uncertain age but most likely old. Any right-sided rib pain? No left-sided rib fracture seen. Electronically Signed   By: Bary Richard M.D.   On: 10/19/2015 19:46    Microbiology: No results found for this or any previous visit (from the past 240 hour(s)).   Labs: Basic Metabolic Panel:  Recent Labs Lab 10/19/15 1915 10/20/15 0719  NA 143 145  K 3.7 3.3*  CL 114* 114*  CO2 21* 21*  GLUCOSE 151* 80  BUN 21* 20  CREATININE 1.41* 1.36*  CALCIUM 7.8* 7.9*   Liver Function Tests:  Recent Labs Lab 10/19/15 1915  AST 26  ALT 16*  ALKPHOS 80  BILITOT 0.6  PROT 6.0*  ALBUMIN 3.4*   No results for input(s): LIPASE, AMYLASE in the last 168 hours. No results for input(s): AMMONIA in the last 168 hours. CBC:  Recent Labs Lab 10/18/15 1521 10/19/15 1915 10/20/15 0724  WBC 12.4* 9.7 9.9  HGB  --  8.8* 9.8*  HCT 32.3* 27.7* 30.4*  MCV 95 97.9 97.1  PLT 220 175 155   Cardiac Enzymes:  Recent Labs Lab 10/19/15 1915  TROPONINI 0.03   BNP: BNP (last 3 results)  Recent Labs   04/11/15 1702 10/19/15 1915  BNP 755.5* 909.0*    ProBNP (last 3 results) No results for input(s): PROBNP in the last 8760 hours.  CBG:  Recent Labs Lab 10/20/15 0715 10/20/15 1123  GLUCAP 80 87       Signed:  HERNANDEZ ACOSTA,Zymier Rodgers  [facility] Pager: [phone] 10/20/2015, 4:03 PM

## 2015-10-20 NOTE — Telephone Encounter (Signed)
Rx prior auth APPROVED for pt's ALPRAZolam Prudy Feeler) tablet 1 mg, valid 10/09/15-1/116/18, called & notified pt, faxed approval to [facility]  ID# [id number]

## 2015-10-20 NOTE — Assessment & Plan Note (Signed)
70 year old pleasant male recently hospitalid for weakness, falls, and found to have Hgb 7.6 without overt GI bleeding and heme negative. Associate acute renal injury, NSTEMI and not a candidate for diagnostic evaluation in setting of multiple health issues and renal insufficiency. Received 2 units during admission with improvement of Hgb. At time of hospitalization had been on Plavix and ASA, with concern for occult GI bleeding. Last EGD in Oct 2013 with erosive reflux esophagitis, patent esophagus with Maloney dilation and NSAID gastropathy. Last colonoscopy 2012 with internal hemorrhoids, normal colon and TI. He is maintained on prednisone as outpatient. Remains without overt GI bleeding, and anemia is likely mixed pattern with possible evolving IDA and chronic disease component. Recheck CBC, iron, ferritin, and obtain ifobt. Will confer with Dr. Wyline Mood regarding any endoscopic evaluation due to recent NSTEMI. Will attempt to obtain Dexilant now. Close follow-up recommended.

## 2015-10-20 NOTE — Plan of Care (Signed)
Problem: Acute Rehab PT Goals(only PT should resolve) Goal: Patient Will Transfer Sit To/From Stand Pt will transfer sit to/from-stand with LRAD at ModI without loss-of-balance to demonstrate good safety awareness for independent mobility in home.     Goal: Pt Will Ambulate Pt will ambulate with LRAD at Supervision for a distance greater than 239ft to demonstrate the ability to perform safe household distance ambulation at discharge.

## 2015-10-20 NOTE — Evaluation (Signed)
Physical Therapy Evaluation Patient Details Name: [name] MRN: [id number] DOB: 1946-06-14 Today's Date: 10/20/2015   History of Present Illness  70yo white male comes to [facility] after worsening weakness at home, found to be anemic upon admission. PMH: CVA in Oct 2016, NSTEMI 6 weeks ago, RA c significant involvement of multiple joints. Pt recently completed HHPT able to ambulate a maximum of 440f, but still performing household distances only.   Clinical Impression  Pt is received semirecumbent in bed upon entry, awake, alert, and willing to participate. No acute distress noted. Pt is A&Ox3 and pleasant. Pt demonstrating mild to moderate impairment of strength, activity tolerance, and functional mobility, for which he will benefit from skilled PT intervention, in order to restore to prior level of function, improve patient safety upon discharge, and to decrease falls risk.        Follow Up Recommendations Home health PT    Equipment Recommendations  None recommended by PT    Recommendations for Other Services       Precautions / Restrictions Precautions Precautions: None Restrictions Weight Bearing Restrictions: No      Mobility  Bed Mobility Overal bed mobility: Modified Independent                Transfers Overall transfer level: Modified independent Equipment used: 1 person hand held assist                Ambulation/Gait Ambulation/Gait assistance: Min guard Ambulation Distance (Feet): 45 Feet Assistive device: Rolling walker (2 wheeled)   Gait velocity: Distance limited by back pain.  Gait velocity interpretation: <1.8 ft/sec, indicative of risk for recurrent falls General Gait Details: severve chronic postural changes from PA, including yphosis, forward flexion, >35 degrees of toe-out, etc.   Stairs            Wheelchair Mobility    Modified Rankin (Stroke Patients Only)       Balance Overall balance assessment: No apparent balance  deficits (not formally assessed)                                           Pertinent Vitals/Pain Pain Assessment: Faces Faces Pain Scale: Hurts whole lot Pain Location: Low/middle back (chronic issue)  Pain Descriptors / Indicators: Aching Pain Intervention(s): Limited activity within patient's tolerance;Monitored during session;Repositioned    Home Living                        Prior Function                 Hand Dominance        Extremity/Trunk Assessment   Upper Extremity Assessment:  (globally weak, with chronic deficits in BUE from RA, and most recently loss of fine motor control in R hand since CVA. )           Lower Extremity Assessment: Generalized weakness (uses B ortho shoes with pelite inserts, arch collaps on R with presumed weight bearing through the firrt met/cueinform  joint. )      Cervical / Trunk Assessment: Kyphotic (profound kyphosis as baseline posturing  for ambulation and sitting. )  Communication      Cognition Arousal/Alertness: Awake/alert Behavior During Therapy: WFL for tasks assessed/performed Overall Cognitive Status: Within Functional Limits for tasks assessed  General Comments      Exercises        Assessment/Plan    PT Assessment Patient needs continued PT services  PT Diagnosis Difficulty walking;Generalized weakness   PT Problem List Decreased strength;Decreased range of motion;Decreased activity tolerance;Decreased mobility;Pain  PT Treatment Interventions Gait training;Functional mobility training;Therapeutic activities;Therapeutic exercise;Patient/family education   PT Goals (Current goals can be found in the Care Plan section) Acute Rehab PT Goals Patient Stated Goal: return to home, continue to progres mobility with PT.  PT Goal Formulation: With patient/family Time For Goal Achievement: 11/03/15 Potential to Achieve Goals: Fair    Frequency Min  3X/week   Barriers to discharge        Co-evaluation               End of Session Equipment Utilized During Treatment: Gait belt Activity Tolerance: Patient limited by pain Patient left: with call bell/phone within reach;with family/visitor present (in BR; NA made aware. ) Nurse Communication: Other (comment)    Functional Assessment Tool Used: Clinical Judgment  Functional Limitation: Mobility: Walking and moving around Mobility: Walking and Moving Around Current Status (L3734): At least 40 percent but less than 60 percent impaired, limited or restricted Mobility: Walking and Moving Around Goal Status [phone]): At least 40 percent but less than 60 percent impaired, limited or restricted    Time: 1157-2620 PT Time Calculation (min) (ACUTE ONLY): 18 min   Charges:   PT Evaluation $PT Eval Moderate Complexity: 1 Procedure     PT G Codes:   PT G-Codes **NOT FOR INPATIENT CLASS** Functional Assessment Tool Used: Clinical Judgment  Functional Limitation: Mobility: Walking and moving around Mobility: Walking and Moving Around Current Status (B5597): At least 40 percent but less than 60 percent impaired, limited or restricted Mobility: Walking and Moving Around Goal Status [phone]): At least 40 percent but less than 60 percent impaired, limited or restricted     12:11 PM, 10/20/2015 Etta Grandchild, PT, DPT PRN Physical Therapist at [place] License # [id number] [phone] (wireless)  [phone] (mobile)

## 2015-10-23 ENCOUNTER — Ambulatory Visit: Payer: Medicare Other | Admitting: Surgery

## 2015-10-23 ENCOUNTER — Other Ambulatory Visit: Payer: Self-pay | Admitting: *Deleted

## 2015-10-23 ENCOUNTER — Encounter: Payer: Self-pay | Admitting: *Deleted

## 2015-10-23 NOTE — Patient Outreach (Signed)
Call to patient for Transition of Care week #1 Patient was discharged on 10/20/15  Subjective: Patient reports that the cause of the anemia was a "muscle bleed" in  My side. States he has soreness in the left side.  Patient has severe rheumatoid arthritis, see rheumatologist.  Patient verbalizes they stopped ibuprofen due to stroke last year.  Patient reports Home Health nurse resumed care of wound on "backside"  Patient already has appointment with Primary care, plans to call and get appointment with Neuro. He verbalizes he needs to see neuro soon because he was instructed to stop his aspirin and plavix. And due to previous history of stroke understands he is at risk.   Patient has had a "stomach bug" since early this am. Has taken immodium, denies any bleeding or dark stools.  Patient states he has an issue with affording medication with insurance change this year. Will consider [facility] pharmacy referral for assistance.   Patient reports he has transportation to tomorrow's appointment, Marchelle Folks will take him and will be able to assist him with understanding anything new at appointment as "she is a Engineer, civil (consulting) at [employer]".   Assessment: Coordination of Care:Reviewed upcoming appointments Stroke:Patient needs appointment with neurologist as they took him off aspirin and plavix due to anemia Anemia: HGB was up to 9 at discharge Medication cost: discussed referral to [facility] pharmacy, patient declines this referral this call as he sees rheumatologist tomorrow and she may change medication, wants to discuss at next call.  Plan:  [facility] CM Care Plan Problem One        Most Recent Value   Care Plan Problem One  Care Coordination issues as evidenced by multiple providers   Role Documenting the Problem One  Care Management Coordinator   Care Plan for Problem One  Active   [facility] Long Term Goal (31-90 days)  Patient will be able to keep all upcoming specialist appointments in the next 90 days   [facility] Long Term Goal Start  Date  10/23/15   [facility] CM Short Term Goal #1 (0-30 days)  Patient will be able to write down questions for his specialists and verbalize understanding of any new medical instructions over the next 21 days   [facility] CM Short Term Goal #1 Start Date  10/23/15   Interventions for Short Term Goal #1  Reviewed appointments with patient. Instructed patient to keep a notebook with questions until  [facility] can give him the [facility] calendar to use    [facility] CM Care Plan Problem Two        Most Recent Value   Care Plan Problem Two  Risk for rehospitalization as evidenced by 3 admissions in 6 months   Role Documenting the Problem Two  Care Management Coordinator   Care Plan for Problem Two  Active   [facility] CM Short Term Goal #1 (0-30 days)  Patient will not be readmitted  over the next 30 days for anemia   [facility]N CM Short Term Goal #1 Start Date  10/23/15   Interventions for Short Term Goal #2   Transition of care program. Reviewed discharge instructions and changes. Reviewed upcoming appointments with specialists. Reviewed when to call MD for issues     Patient to call Neuro for appointment Patient will discuss arthritis medication treatment at tomorrow's appointment Patient will self monitor GI bug and keep hydrated and call MD with ongoing symptoms and bleeding. Patient agrees to Transition of Care calls and wants to wait to schedule home visit until next  call  RNCM to keep in transition of Care program calls and will plan for visit within 30 days. RNCM will refer to [facility] pharmacy when patient ready Plan to call next week [facility] E. Albertha Ghee, RN, BSN, CCM  [facility] [employer] [phone]

## 2015-10-23 NOTE — Progress Notes (Signed)
CC'D TO PCP °

## 2015-10-25 ENCOUNTER — Telehealth: Payer: Self-pay | Admitting: Family Medicine

## 2015-10-25 MED ORDER — DIPHENOXYLATE-ATROPINE 2.5-0.025 MG PO TABS: 1.0000 | ORAL_TABLET | Freq: Three times a day (TID) | ORAL | Status: AC | PRN

## 2015-10-25 NOTE — Telephone Encounter (Signed)
May try Lomotil one tid prn,#21,1 refill-can possibly  cause drowsiness /dry mouth/dizziness as a side effect. If bloody stools or fevers then needs OV

## 2015-10-25 NOTE — Telephone Encounter (Signed)
Notified patient may try Lomotil one tid prn,#21,1 refill-can possibly cause drowsiness/dry mouth/dizziness as a side effect. If bloody stools or fevers then needs OV. Patient verbalized understanding. Script will be faxed upon signature from doctor.

## 2015-10-25 NOTE — Telephone Encounter (Signed)
Pt states he has been having diarrhea for 3 days, wants to know if there is  Something other than imodium that can be called in? He feels it is due to the  New med for his arthritis that he just started on.  [place] reids

## 2015-10-26 NOTE — Progress Notes (Signed)
Quick Note:  ifobt negative. ______ 

## 2015-10-27 ENCOUNTER — Encounter: Payer: Self-pay | Admitting: Surgery

## 2015-10-31 ENCOUNTER — Other Ambulatory Visit: Payer: Self-pay | Admitting: *Deleted

## 2015-10-31 NOTE — Patient Outreach (Signed)
Transition of Care week #2 Call attempted to patient. No answer, left VM requesting call back. Alben Spittle. Albertha Ghee, RN, BSN, CCM  [facility] [employer] [phone]

## 2015-10-31 NOTE — Patient Outreach (Signed)
Transition of Care week #2 Spoke with patient  Patient states he has an appointment everyday this week. Today he is going to neurologist today, will verify Plavix and aspirin use or discontinuation. He also sees cardiology, CVTS, and foot MD later in week.  Patient states he is still weak and is using cane to walk, still taking HH PT.  Nurse is still coming out for wound care, wife does bid dressing changes. Patient still waiting on Hospital bed, approved but not been delivered. Patient reports Rheumatologist gave 30 day supply of Harriette Ohara and is working on getting approved for assistance program.  Assessment: Anemia: off Plavix and aspirin as of now Care coordination issues: multiple MD appointments, needs [facility]) calendar Medication affordability: MD office working on PAP, will refer to [facility] pharmacy if needed, needs another med box for medication management.  Plan: [facility] CM Care Plan Problem One        Most Recent Value   Care Plan Problem One  Care Coordination issues as evidenced by multiple providers   Role Documenting the Problem One  Care Management Coordinator   Care Plan for Problem One  Active   [facility] Long Term Goal (31-90 days)  Patient will be able to keep all upcoming specialist appointments in the next 90 days   [facility] Long Term Goal Start Date  10/23/15   [facility] CM Short Term Goal #1 (0-30 days)  Patient will be able to write down questions for his specialists and verbalize understanding of any new medical instructions over the next 21 days   [facility] CM Short Term Goal #1 Start Date  10/23/15   Interventions for Short Term Goal #1  Reviewed upcoming appointments, verified patient has transportation, Reminded patient to keep a notebook with questions until [facility] can give him the [facility] calendar to use    [facility] CM Care Plan Problem Two        Most Recent Value   Care Plan Problem Two  Risk for rehospitalization as evidenced by 3 admissions in 6 months   Role  Documenting the Problem Two  Care Management Coordinator   Care Plan for Problem Two  Active   [facility] CM Short Term Goal #1 (0-30 days)  Patient will not be readmitted  over the next 30 days for anemia   [facility] CM Short Term Goal #1 Start Date  10/23/15   Interventions for Short Term Goal #2   Reviewed upcoming appointments with specialists, reminded to discuss with neuro the plavix and aspirin. Reviewed when to call MD for issues     Monitor for referral to [facility] pharamacy Bring med box to initial hv appointment Call next week to schedule appointment for initial hv as patient has several upcoming appointments this week and early next week.  Alben Spittle. Albertha Ghee, RN, BSN, CCM  [facility] [employer] [phone]

## 2015-11-01 ENCOUNTER — Encounter: Payer: Self-pay | Admitting: Cardiology

## 2015-11-01 ENCOUNTER — Ambulatory Visit (INDEPENDENT_AMBULATORY_CARE_PROVIDER_SITE_OTHER): Payer: Medicare Other | Admitting: Cardiology

## 2015-11-01 VITALS — BP 134/78 | HR 87 | Ht 66.0 in | Wt 137.0 lb

## 2015-11-01 DIAGNOSIS — E785 Hyperlipidemia, unspecified: Secondary | ICD-10-CM | POA: Diagnosis not present

## 2015-11-01 DIAGNOSIS — I10 Essential (primary) hypertension: Secondary | ICD-10-CM | POA: Diagnosis not present

## 2015-11-01 DIAGNOSIS — I2510 Atherosclerotic heart disease of native coronary artery without angina pectoris: Secondary | ICD-10-CM | POA: Diagnosis not present

## 2015-11-01 DIAGNOSIS — I739 Peripheral vascular disease, unspecified: Secondary | ICD-10-CM

## 2015-11-01 DIAGNOSIS — I6523 Occlusion and stenosis of bilateral carotid arteries: Secondary | ICD-10-CM

## 2015-11-01 NOTE — Progress Notes (Signed)
Patient ID: [name], male   DOB: 1946/07/27, 70 y.o.   MRN: [id number]     Clinical Summary [name] is a 70 y.o.male seen today for follow up of the following medical problems.   1. CAD  - prior CABG 1998 at [facility]  - last cath 10/2007 shows LM 95%, occluded LAD, LCX, and RCA. LIMA-LAD patent, SVG-OM patent, SVG to RCA occluded. Echo Jan 2012 LVEF 60-65%, grade II diastolicdysfunction  - admit 08/2015 with elevated troponin in setting of anemia and hypotension. Managed medically due to advanced comrobidities, including renal dysfunction and severe anemia. Echo showed normal LVEF and now WMAs, EKG was non acute, and he had no cadiopulmonary symptoms.   - can have some chest pain at times. Dull pain midchest, 2/10. Mild SOB at that time.  - DOE stable at short distances.   2. PAD  - prior SFA to popliteal bypass with SVG, with amputation of the left fifth toe  - also history of left iliac stenting  - recent procedure 09/2013 with lower extremity cath/angio, high grade stenosis tibial artery that was balloon dilated.  - followed by vascular  - denies any claudication pain.   3. HL  - currently on high dose atorva. Crestor caused muscle aches before.  - 06/2015 TC 186 TG 273 HDL 34 LDL 97  4. HTN - compliant with meds  5. CVA - admit 06/2015 with acute right sided weakness - MRI showed subacute left MCA infarct as well as infarct of medial right occipital lobe. He was startd on DAPT by neuro.  - followed by neuro  6. Dyspnea - has been progressing, patient is followed by pulmonary. There is concern for possible interstitial lung disease or possible pulmonary HTN. He does have history of RA and has been on methotrexate. RHC has been requested by his pulmonologist. - 06/2015 echo LVEF 60-65%, grade II diastolic dysfunction, PASP 36, normal RV - high resolution chest CT pending  - patient has asked to hold off on bronch and RHC at this time.    7. Left flank  hematoma - admit 09/2015 with idiopathic hematoma. ASA and plavix were stopped x 2 weeks, he had been on due to prior CVA.  - remains off ASA and plavix until Feb 21   8. Interstitial lung disease - abnormal CT 06/2015 with multiple parenchymal changes, further workup by pulmonary. He is being considered for a bronch however he has refused the procedure. There is concern for infectious process vs RA related pulm fibrosis. - he has asked not to undergo bronch at this time.   9. Anemia - being considered for endoscopy by GI  Past Medical History  Diagnosis Date  . 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 Hyperlipidemia   . Hypertension   . PVD (peripheral vascular disease) (HCC)     Left iliac PCI/stent  . Cerebrovascular disease   . Tobacco abuse, in remission     Remote  . GERD (gastroesophageal reflux disease)   . Hypothyroid   . Allergic rhinitis   . Chronic lung disease     ?  Rheumatoid lung; ?  Adverse reaction to methotrexate  . Schatzki's ring   . Hx of Clostridium difficile infection     Recurrent; associated 40 pound weight loss  . Achalasia   . Diabetes mellitus     borderline no meds  . Atrial fibrillation (HCC)   . Chronic back pain   . 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 Cervical spondylosis   . DDD (degenerative disc disease), lumbar   . Pneumonia   . Carotid artery occlusion   . Nephrolithiasis 2010    s/p lithotripsy  . Hiatal hernia      Allergies  Allergen Reactions  . Clindamycin/Lincomycin Other (See Comments)    Kidney Failure  . Amoxicillin Hives and Other (See Comments)    Hallucinations   . Ciprofloxacin Other (See Comments)    C-diff  . Other Hives, Diarrhea and Other (See Comments)    "Mycin" Causes C-diff  . Ceftin [Cefuroxime Axetil]     Taste like rusty iron, nausea,  . Doxycycline Nausea And Vomiting  . Levofloxacin Nausea And Vomiting     Current Outpatient Prescriptions  Medication  Sig Dispense Refill  . ALPRAZolam (XANAX) 1 MG tablet TAKE 1 TABLET BY MOUTH ONCE DAILY AS NEEDED FOR ANXIETY. (Patient taking differently: TAKE 1 TABLET BY MOUTH ONCE DAILY AT BEDTIME) 30 tablet 5  . atenolol (TENORMIN) 25 MG tablet TAKE (1) TABLET BY MOUTH TWICE DAILY. 60 tablet 5  . atorvastatin (LIPITOR) 80 MG tablet TAKE ONE TABLET BY MOUTH ONCE DAILY. 30 tablet 11  . diphenoxylate-atropine (LOMOTIL) 2.5-0.025 MG tablet Take 1 tablet by mouth 3 (three) times daily as needed for diarrhea or loose stools. 21 tablet 1  . finasteride (PROSCAR) 5 MG tablet Take 5 mg by mouth daily.    . folic acid (FOLVITE) 1 MG tablet Take 1 mg by mouth daily.    . furosemide (LASIX) 20 MG tablet Take 20 mg by mouth daily as needed for fluid.    . hydrALAZINE (APRESOLINE) 25 MG tablet Take 1.5 tablets (37.5 mg total) by mouth every 8 (eight) hours. 90 tablet 2  . HYDROcodone-acetaminophen (NORCO) 10-325 MG tablet Take 1 tablet by mouth 4 (four) times daily. *May take one additional tablet daily as needed for pain    . isosorbide mononitrate (IMDUR) 30 MG 24 hr tablet Take 1 tablet (30 mg total) by mouth daily. 30 tablet 2  . levothyroxine (SYNTHROID, LEVOTHROID) 88 MCG tablet TAKE ONE TABLET BY MOUTH ONCE DAILY. 30 tablet 5  . losartan (COZAAR) 25 MG tablet Take 1 tablet (25 mg total) by mouth daily. 30 tablet 3  . Melatonin 3 MG TABS Take 3-6 mg by mouth at bedtime as needed.     . Multiple Vitamin (MULTIVITAMIN) capsule Take 1 capsule by mouth daily.    . pantoprazole (PROTONIX) 40 MG tablet Take 1 tablet (40 mg total) by mouth 2 (two) times daily before a meal. 60 tablet 2  . predniSONE (DELTASONE) 10 MG tablet TAKE ONE TABLET BY MOUTH ONCE DAILY. 30 tablet 11  . Probiotic Product (FLORAJEN3 PO) Take 1 capsule by mouth at bedtime.    . tamsulosin (FLOMAX) 0.4 MG CAPS capsule Take 0.4 mg by mouth every evening.     . Vitamin D, Ergocalciferol, (DRISDOL) 50000 UNITS CAPS capsule Take 50,000 Units by mouth every  7 (seven) days. On Monday.     No current facility-administered medications for this visit.     Past Surgical History  Procedure Laterality Date  . Coronary artery bypass graft      x6 In 1998  . Total knee arthroplasty      Right  . Ankle fusion      Left  . Wrist fusion      Right  . Shoulder surgery    . Colonoscopy  09/12/2011  Dr. Elly Modena hemorrhoids, normal colon and distal terminal ileum. Random bx negative. Stool for CDiff positive.  . Esophagogastroduodenoscopy  09/13/2011    Dr. Lyndle Herrlich plawues mid-esophagus KOH negative. Distal esophageal ring and ulcer. Mild gastritis. duodenal diverticulum. Savaory dilation 30mm.   Gaspar Bidding dilation  09/13/2011  . Esophageal manometry  09/30/2011    Procedure: ESOPHAGEAL MANOMETRY (EM);  Surgeon: Rob Bunting, MD;  Location: [facility] ENDOSCOPY;  Service: Endoscopy;  Laterality: N/A;  . Esophagogastroduodenoscopy  09/12/2011    Dr. Rinaldo Ratel rings s/p dilation  . Cardiac surgery    . Esophagomyotomy  11/12/11    [facility]- with DOR antireflux surgery  . Esophagogastroduodenoscopy  06/25/2012    Dr. Jena Gauss: erosive reflux esophagitis, patent esophagus s/p malony dilation, gastric erosions/ulcerations consistent with NSAID gastropathy, neg H.pylori   . Savory dilation  06/25/2012    Procedure: SAVORY DILATION;  Surgeon: Corbin Ade, MD;  Location: [facility] ENDO SUITE;  Service: Endoscopy;  Laterality: N/A;  Elease Hashimoto dilation  06/25/2012    Procedure: Elease Hashimoto DILATION;  Surgeon: Corbin Ade, MD;  Location: [facility] ENDO SUITE;  Service: Endoscopy;  Laterality: N/A;  . Ankle fusion  09/01/2012    Procedure: ANKLE FUSION;  Surgeon: Valeria Batman, MD;  Location: [facility] OR;  Service: Orthopedics;  Laterality: Left;  Take down of angulated tibial/fibula Fractures  . Femoral-tibial bypass graft Left 01/28/2013    Procedure: BYPASS GRAFT FEMORAL-TIBIAL ARTERY;  Surgeon: Nada Libman, MD;  Location: [facility] OR;  Service: Vascular;  Laterality:  Left;  Ultrasound Guided  . Amputation Left 01/28/2013    Procedure: AMPUTATION LEFT 5TH TOE;  Surgeon: Nada Libman, MD;  Location: [facility] OR;  Service: Vascular;  Laterality: Left;  . Spine surgery  11-03-13  . Abdominal aortagram N/A 01/01/2013    Procedure: ABDOMINAL Ronny Flurry;  Surgeon: Sherren Kerns, MD;  Location: [facility] CATH LAB;  Service: Cardiovascular;  Laterality: N/A;  . Abdominal aortagram N/A 10/05/2013    Procedure: ABDOMINAL AORTAGRAM;  Surgeon: Nada Libman, MD;  Location: [facility] CATH LAB;  Service: Cardiovascular;  Laterality: N/A;  . Lower extremity angiogram Left 10/05/2013    Procedure: LOWER EXTREMITY ANGIOGRAM;  Surgeon: Nada Libman, MD;  Location: [facility] CATH LAB;  Service: Cardiovascular;  Laterality: Left;  . Abdominal aortagram N/A 11/01/2014    Procedure: ABDOMINAL Ronny Flurry;  Surgeon: Nada Libman, MD;  Location: [facility] CATH LAB;  Service: Cardiovascular;  Laterality: N/A;  . Joint replacement Right 1999    Knee  . Toe amputation Left 09/05/2014    Removed left fifth toe  . Cataract extraction w/phaco Left 12/22/2014    Procedure: CATARACT EXTRACTION PHACO AND INTRAOCULAR LENS PLACEMENT (IOC);  Surgeon: Gemma Payor, MD;  Location: [facility] ORS;  Service: Ophthalmology;  Laterality: Left;  CDE:11.89  . Cataract extraction w/phaco Right 01/30/2015    Procedure: CATARACT EXTRACTION PHACO AND INTRAOCULAR LENS PLACEMENT RIGHT EYE;  Surgeon: Gemma Payor, MD;  Location: [facility] ORS;  Service: Ophthalmology;  Laterality: Right;  CDE:10.56  . Eye surgery Left December 22, 2014    Cataract  . Eye surgery Right December 31, 2014    Cataract     Allergies  Allergen Reactions  . Clindamycin/Lincomycin Other (See Comments)    Kidney Failure  . Amoxicillin Hives and Other (See Comments)    Hallucinations   . Ciprofloxacin Other (See Comments)    C-diff  . Other Hives, Diarrhea and Other (See Comments)    "Mycin" Causes C-diff  . Ceftin [Cefuroxime Axetil]  Taste like rusty iron, nausea,  .  Doxycycline Nausea And Vomiting  . Levofloxacin Nausea And Vomiting      Family History  Problem Relation Age of Onset  . Stomach cancer Mother 57  . Cancer Mother     Stomach  . Heart disease Mother   . Heart attack Mother   . Heart attack Father   . Heart disease Father   . Heart disease Brother   . Leukemia Brother   . Lung disease Son     infant  . Lung disease Daughter     infant  . Colon cancer Neg Hx      Social History [name] reports that he quit smoking about 50 years ago. His smoking use included Cigarettes. He started smoking about 59 years ago. He has a 2.5 pack-year smoking history. He has quit using smokeless tobacco. [name] reports that he does not drink alcohol.   Review of Systems CONSTITUTIONAL: No weight loss, fever, chills, weakness or fatigue.  HEENT: Eyes: No visual loss, blurred vision, double vision or yellow sclerae.No hearing loss, sneezing, congestion, runny nose or sore throat.  SKIN: No rash or itching.  CARDIOVASCULAR: per hpi RESPIRATORY: per hpi  GASTROINTESTINAL: No anorexia, nausea, vomiting or diarrhea. No abdominal pain or blood.  GENITOURINARY: No burning on urination, no polyuria NEUROLOGICAL: No headache, dizziness, syncope, paralysis, ataxia, numbness or tingling in the extremities. No change in bowel or bladder control.  MUSCULOSKELETAL: No muscle, back pain, joint pain or stiffness.  LYMPHATICS: No enlarged nodes. No history of splenectomy.  PSYCHIATRIC: No history of depression or anxiety.  ENDOCRINOLOGIC: No reports of sweating, cold or heat intolerance. No polyuria or polydipsia.  Marland Kitchen   Physical Examination Filed Vitals:   11/01/15 1539  BP: 134/78  Pulse: 87   Filed Vitals:   11/01/15 1539  Height:  (1.676 m)  Weight: 137 lb (62.143 kg)    Gen: resting comfortably, no acute distress HEENT: no scleral icterus, pupils equal round and reactive, no palptable cervical adenopathy,  CV: RRR, no m/r/g, no  jvd Resp: Clear to auscultation bilaterally GI: abdomen is soft, non-tender, non-distended, normal bowel sounds, no hepatosplenomegaly MSK: extremities are warm, no edema.  Skin: warm, no rash Neuro:  no focal deficits Psych: appropriate affect   Diagnostic Studies  10/2007 Cath  HEMODYNAMIC RESULTS: Left ventricle 139/16 mmHg. Aorta 136/67 mmHg.  ANGIOGRAPHIC FINDINGS:  1. Left main coronary artery is severely diseased to approximately 95%  in diffuse fashion. This has progressed compared to the previous  angiogram. This vessel gives rise to the left anterior descending,  a very small ramus intermedius, and circumflex vessels.  2. The left anterior descending is occluded proximally.  3. The circumflex coronary artery is occluded proximally.  4. The right coronary artery is occluded proximally just after the  takeoff of a right ventricular marginal branch. There are some  right to right bridging collaterals noted filling the right  coronary artery nearly down to the distal portion.  5. The saphenous vein graft to the first and second obtuse marginals  is widely patent. There is an area of 30-40% stenosis within the  proximal vessel although not clearly flow-limiting. The  anastomotic sites look to be intact.  6. There is known occlusion of the saphenous vein graft of the right  coronary system.  7. The LIMA to the left anterior descending and diagonal is widely  patent. Anastomotic sites are intact. Of note, the distal left  anterior  descending is diffusely diseased, and there is an apical  90% stenosis noted. The diagonal branch is also diffusely diseased  and relatively small.  8. There is some left-to-right collateralization of the distal right  coronary artery system, although not well-developed.  Ventriculography is performed in the RAO projection and reveals an  ejection fraction of approximately 60% with no significant wall motion  abnormality  and no mitral regurgitation.  DIAGNOSES:  1. Severe native coronary artery disease as outlined. There has been  progression in left main disease, although continued occlusion  proximally of the left anterior descending and circumflex vessels.  The distal left anterior descending is diffusely diseased, and  there is some left-to-right collateralization of the occluded right  coronary artery although not to a large degree.  2. Patent left internal mammary artery to left anterior descending and  diagonal, patent saphenous vein graft to the first and second  obtuse marginal with 30-40% proximal vein graft stenosis (not  clearly flow-limiting), and known occlusion of the saphenous vein  graft to posterior descending and right coronary artery.  3. Left ventricular ejection fraction of approximately 60% with no  large focal wall motion abnormality and a left ventricular end-  diastolic pressure of 16 mmHg. No significant mitral regurgitation  is noted.  DISCUSSION: No obvious revascularization options noted at this time.  Suspect that angina may well be related to progression in the patient's  native disease rather than new hemodynamically significant occlusions  within his remaining 2 bypass grafts which are widely patent. There is  30-40% stenosis within the saphenous vein graft of the obtuse marginal  system, although this does not appear to be flow-limiting. At this  point, medical therapy seems to be the best option. I discussed this  with the patient, his family, and with Dr. [name].   Echo 09/2010: LVEF 60-65%, grade II diastolic dysfunction   04/2013 ABI: right 1.39 left 1.46  Jan 2015 LE Angiogram  Findings:  Aortogram: No significant suprarenal aortic stenosis is identified. There is no evidence of renal artery stenosis. The infrarenal abdominal aorta is widely patent. The stent within the left common iliac artery is widely patent. The left external iliac  artery is widely patent. The right iliac system is widely patent.  Left Lower Extremity: The left common femoral and profunda femoral arteries are widely patent. The left superficial femoral artery is patent down to the adductor canal where it occludes. There is a bypass graft originating from the distal superficial femoral artery. The proximal anastomosis is widely patent. The vein bypass graft is widely patent. The distal anastomosis shows mild narrowing and just beyond the anastomosis is a high-grade stenosis of approximately 80%. The posterior tibial artery isn't patent down across the ankle.  Intervention: After the above images were acquired, the decision was made to proceed with intervention. Over an 035 wire, a 6 French sheath was advanced into the left superficial femoral artery. The patient was fully heparinized. A [place] core wire was easily advanced across the distal anastomosis. I selected a 3 x 20 Fox SV balloon and perform primary balloon angioplasty of the distal anastomosis and posterior tibial artery. The balloon was taken 14 atmospheres for 2 minutes. Completion angiography revealed resolution of the stenosis. At this point catheters and wires were removed. The sheath was withdrawn to the right external iliac artery. The patient was taken to the holding area for sheath pull once his coagulation profile corrects.  Impression:  #1 high-grade stenosis at the distal  anastomosis and in the native posterior tibial artery just beyond the distal anastomosis. This was successfully dilated using a 3 x 20 balloon with excellent results.  12/01/13 Clinic EKG NSR, LVH, LAE   08/2015 Echo Study Conclusions  - Left ventricle: The cavity size was normal. Wall thickness was increased in a pattern of moderate LVH. Systolic function was normal. The estimated ejection fraction was in the range of 55% to 60%. Wall motion was normal; there were no regional wall motion abnormalities. -  Aortic valve: Moderately calcified annulus. Trileaflet; mildly thickened leaflets. Valve area (VTI): 2.7 cm^2. Valve area (Vmax): 2.6 cm^2. - Mitral valve: Moderately calcified annulus. Mildly thickened leaflets . There was mild regurgitation. - Left atrium: The atrium was severely dilated. - Right ventricle: The cavity size was moderately dilated. - Right atrium: The atrium was moderately dilated. - Atrial septum: No defect or patent foramen ovale was identified. - Tricuspid valve: There was mild-moderate regurgitation. - Pulmonary arteries: Systolic pressure was mildly to moderately increased. PA peak pressure: 39 mm Hg (S). - Technically adequate study.   Assessment and Plan   1. CAD  - recent admit with elevated troponin in setting of severe anemia and hypotension, managed medically secondary to mutliple advanced comorbidities Echo with stable LVEF - continue current medical therapy. Off antiplatelet therapy currently due to recent hematoma.   2. PAD  - continue follow up with vascular  3. Hyperlipidemia  - continue high dose statin  4. HTN -at goal, continue current meds   5. CVA - secondary prevention per neuro  6. Dyspnea - multifactorial, he has asked to hold off on further workup at this time including RHC and bronch by pulmonary  7. Anemia - workup per GI, from cardiac standpoint ok proceeding with endoscopy if indicated. Would consider touching base with his pulmonologist as well regarding clearance.   F/u 3 months    Antoine Poche, M.D.

## 2015-11-01 NOTE — Patient Instructions (Signed)
Medication Instructions:  Your physician recommends that you continue on your current medications as directed. Please refer to the Current Medication list given to you today.  Labwork: NONE  Testing/Procedures: NONE  Follow-Up: Your physician recommends that you schedule a follow-up appointment in: 3 MONTHS WITH DR. BRANCH  Any Other Special Instructions Will Be Listed Below (If Applicable).  If you need a refill on your cardiac medications before your next appointment, please call your pharmacy. 

## 2015-11-02 ENCOUNTER — Telehealth: Payer: Self-pay | Admitting: Family Medicine

## 2015-11-02 NOTE — Telephone Encounter (Signed)
Narrative requirement form (this is a requirement per Medicare)  received from [facility] for pt's hospital bed, sent to Dr. Brett Canales & Tyler Pita to be completed and asked that they bring to me to be faxed back to [facility]  (Form in green folder in basket at nurses station)

## 2015-11-02 NOTE — Telephone Encounter (Signed)
Form in yellow forms folder at nurses station

## 2015-11-03 ENCOUNTER — Ambulatory Visit: Payer: Medicare Other | Admitting: Surgery

## 2015-11-06 ENCOUNTER — Ambulatory Visit: Payer: Medicare Other | Admitting: Internal Medicine

## 2015-11-07 ENCOUNTER — Other Ambulatory Visit: Payer: Self-pay | Admitting: *Deleted

## 2015-11-07 ENCOUNTER — Ambulatory Visit: Payer: Self-pay | Admitting: *Deleted

## 2015-11-07 ENCOUNTER — Encounter: Payer: Self-pay | Admitting: *Deleted

## 2015-11-07 DIAGNOSIS — L89320 Pressure ulcer of left buttock, unstageable: Secondary | ICD-10-CM

## 2015-11-07 DIAGNOSIS — D649 Anemia, unspecified: Secondary | ICD-10-CM

## 2015-11-07 DIAGNOSIS — M069 Rheumatoid arthritis, unspecified: Secondary | ICD-10-CM | POA: Diagnosis not present

## 2015-11-07 DIAGNOSIS — I69351 Hemiplegia and hemiparesis following cerebral infarction affecting right dominant side: Secondary | ICD-10-CM

## 2015-11-07 NOTE — Patient Outreach (Signed)
Transition of Care week #3  Spoke with patient, patient reports no medication changes. He states he will have to see his neuro next week, it was too soon off the plavix for an evaluation so has to go back next week. Also, has hearing screen appointment and appointment with primary care. Patient reports home health PT to be out this afternoon.  He did get hospital bed last week.  Patient voices concern over cost of Harriette Ohara, his rheumatologist is working on getting assistance, he now has samples. Patient denies any medications changes from his last week's appointments. No new falls or concerns. Patient reports pain still is a 6-7 in his back. Patient reports poor appetite since a surgery on esophagus in 2013 uses Ensure supplement.  [facility] CM Care Plan Problem One        Most Recent Value   Care Plan Problem One  Care Coordination issues as evidenced by multiple providers   Role Documenting the Problem One  Care Management Coordinator   Care Plan for Problem One  Active   [facility] Long Term Goal (31-90 days)  Patient will be able to keep all upcoming specialist appointments in the next 90 days   [facility] Long Term Goal Start Date  10/23/15   [facility] CM Short Term Goal #1 (0-30 days)  Patient will be able to write down questions for his specialists and verbalize understanding of any new medical instructions over the next 21 days   [facility] CM Short Term Goal #1 Start Date  10/23/15   Interventions for Short Term Goal #1  Reviewed upcoming appointments, Reminded patient to keep a notebook with questions until [facility] can give him the [facility] calendar to use    [facility] CM Care Plan Problem Two        Most Recent Value   Care Plan Problem Two  Risk for rehospitalization as evidenced by 3 admissions in 6 months   Role Documenting the Problem Two  Care Management Coordinator   Care Plan for Problem Two  Active   [facility] CM Short Term Goal #1 (0-30 days)  Patient will not be readmitted  over the next 30 days for anemia   [facility] CM  Short Term Goal #1 Start Date  10/23/15   Interventions for Short Term Goal #2   Reviewed upcoming appointments with specialists. Reviewed when to call MD for issues     Plan to call next week, scheduled home visit for 2/27. Patient will keep his upcoming appointments and call RNCM with any new questions or concerns. Alben Spittle. Albertha Ghee, RN, BSN, CCM  [facility] [employer] [phone]

## 2015-11-08 ENCOUNTER — Ambulatory Visit: Payer: Medicare Other | Admitting: Family Medicine

## 2015-11-08 ENCOUNTER — Telehealth: Payer: Self-pay | Admitting: Internal Medicine

## 2015-11-08 NOTE — Telephone Encounter (Signed)
Tried to call pt- LMOM 

## 2015-11-08 NOTE — Telephone Encounter (Signed)
Pt received a letter from [facility] and had a question about his ferritin. Please call him at [phone] he said if they didn't answer to leave a message.

## 2015-11-09 NOTE — Telephone Encounter (Signed)
Spoke with the pt, explained the labs to him. He is concerned about having a tcs and getting cleaned out. He said he has a hard time getting to the bathroom on time. He said to let him know if you speak with cardiology.

## 2015-11-13 ENCOUNTER — Other Ambulatory Visit: Payer: Self-pay | Admitting: Family Medicine

## 2015-11-13 ENCOUNTER — Other Ambulatory Visit: Payer: Self-pay | Admitting: *Deleted

## 2015-11-13 NOTE — Patient Outreach (Signed)
Transition of Care call attempted. No answer, left message requesting call back. Thomas Mabry E. Korryn Pancoast, RN, BSN, CCM  [facility] Community Care Manager [phone] 

## 2015-11-13 NOTE — Patient Outreach (Signed)
Call attempted to patient. Spoke briefly with patient he requests call back later in week. Plan to call later in week. Alben Spittle. Albertha Ghee, RN, BSN, CCM  [facility] [employer] [phone]

## 2015-11-14 ENCOUNTER — Inpatient Hospital Stay (HOSPITAL_COMMUNITY)
Admission: EM | Admit: 2015-11-14 | Discharge: 2015-11-22 | DRG: 215 | Disposition: E | Payer: Medicare Other | Attending: Cardiovascular Disease | Admitting: Cardiovascular Disease

## 2015-11-14 ENCOUNTER — Inpatient Hospital Stay (HOSPITAL_COMMUNITY): Payer: Medicare Other

## 2015-11-14 ENCOUNTER — Telehealth: Payer: Self-pay | Admitting: Cardiology

## 2015-11-14 ENCOUNTER — Encounter (HOSPITAL_COMMUNITY): Admission: EM | Disposition: E | Payer: Self-pay | Source: Home / Self Care | Attending: Cardiovascular Disease

## 2015-11-14 ENCOUNTER — Encounter (HOSPITAL_COMMUNITY): Payer: Self-pay | Admitting: Cardiology

## 2015-11-14 DIAGNOSIS — M5136 Other intervertebral disc degeneration, lumbar region: Secondary | ICD-10-CM | POA: Diagnosis present

## 2015-11-14 DIAGNOSIS — I679 Cerebrovascular disease, unspecified: Secondary | ICD-10-CM | POA: Diagnosis present

## 2015-11-14 DIAGNOSIS — R579 Shock, unspecified: Secondary | ICD-10-CM

## 2015-11-14 DIAGNOSIS — E1122 Type 2 diabetes mellitus with diabetic chronic kidney disease: Secondary | ICD-10-CM | POA: Diagnosis present

## 2015-11-14 DIAGNOSIS — T82858A Stenosis of vascular prosthetic devices, implants and grafts, initial encounter: Secondary | ICD-10-CM | POA: Diagnosis present

## 2015-11-14 DIAGNOSIS — M549 Dorsalgia, unspecified: Secondary | ICD-10-CM | POA: Diagnosis present

## 2015-11-14 DIAGNOSIS — E2749 Other adrenocortical insufficiency: Secondary | ICD-10-CM | POA: Diagnosis present

## 2015-11-14 DIAGNOSIS — I252 Old myocardial infarction: Secondary | ICD-10-CM | POA: Diagnosis not present

## 2015-11-14 DIAGNOSIS — J9601 Acute respiratory failure with hypoxia: Secondary | ICD-10-CM | POA: Diagnosis not present

## 2015-11-14 DIAGNOSIS — Z9841 Cataract extraction status, right eye: Secondary | ICD-10-CM

## 2015-11-14 DIAGNOSIS — Z4659 Encounter for fitting and adjustment of other gastrointestinal appliance and device: Secondary | ICD-10-CM

## 2015-11-14 DIAGNOSIS — N179 Acute kidney failure, unspecified: Secondary | ICD-10-CM | POA: Diagnosis present

## 2015-11-14 DIAGNOSIS — Z8673 Personal history of transient ischemic attack (TIA), and cerebral infarction without residual deficits: Secondary | ICD-10-CM

## 2015-11-14 DIAGNOSIS — R079 Chest pain, unspecified: Secondary | ICD-10-CM | POA: Diagnosis present

## 2015-11-14 DIAGNOSIS — K7200 Acute and subacute hepatic failure without coma: Secondary | ICD-10-CM | POA: Diagnosis not present

## 2015-11-14 DIAGNOSIS — K219 Gastro-esophageal reflux disease without esophagitis: Secondary | ICD-10-CM | POA: Diagnosis present

## 2015-11-14 DIAGNOSIS — Z9842 Cataract extraction status, left eye: Secondary | ICD-10-CM

## 2015-11-14 DIAGNOSIS — I469 Cardiac arrest, cause unspecified: Secondary | ICD-10-CM | POA: Diagnosis not present

## 2015-11-14 DIAGNOSIS — G8929 Other chronic pain: Secondary | ICD-10-CM | POA: Diagnosis present

## 2015-11-14 DIAGNOSIS — E039 Hypothyroidism, unspecified: Secondary | ICD-10-CM | POA: Diagnosis present

## 2015-11-14 DIAGNOSIS — I25710 Atherosclerosis of autologous vein coronary artery bypass graft(s) with unstable angina pectoris: Secondary | ICD-10-CM | POA: Diagnosis present

## 2015-11-14 DIAGNOSIS — Z7952 Long term (current) use of systemic steroids: Secondary | ICD-10-CM | POA: Diagnosis not present

## 2015-11-14 DIAGNOSIS — R570 Cardiogenic shock: Secondary | ICD-10-CM | POA: Diagnosis not present

## 2015-11-14 DIAGNOSIS — D62 Acute posthemorrhagic anemia: Secondary | ICD-10-CM | POA: Diagnosis not present

## 2015-11-14 DIAGNOSIS — Z978 Presence of other specified devices: Secondary | ICD-10-CM | POA: Insufficient documentation

## 2015-11-14 DIAGNOSIS — M069 Rheumatoid arthritis, unspecified: Secondary | ICD-10-CM | POA: Diagnosis present

## 2015-11-14 DIAGNOSIS — E872 Acidosis: Secondary | ICD-10-CM | POA: Diagnosis present

## 2015-11-14 DIAGNOSIS — E875 Hyperkalemia: Secondary | ICD-10-CM | POA: Diagnosis not present

## 2015-11-14 DIAGNOSIS — D696 Thrombocytopenia, unspecified: Secondary | ICD-10-CM | POA: Diagnosis present

## 2015-11-14 DIAGNOSIS — I97711 Intraoperative cardiac arrest during other surgery: Secondary | ICD-10-CM | POA: Diagnosis present

## 2015-11-14 DIAGNOSIS — J8410 Pulmonary fibrosis, unspecified: Secondary | ICD-10-CM | POA: Diagnosis present

## 2015-11-14 DIAGNOSIS — I2121 ST elevation (STEMI) myocardial infarction involving left circumflex coronary artery: Secondary | ICD-10-CM | POA: Diagnosis present

## 2015-11-14 DIAGNOSIS — Z961 Presence of intraocular lens: Secondary | ICD-10-CM | POA: Diagnosis present

## 2015-11-14 DIAGNOSIS — E785 Hyperlipidemia, unspecified: Secondary | ICD-10-CM | POA: Diagnosis present

## 2015-11-14 DIAGNOSIS — G9340 Encephalopathy, unspecified: Secondary | ICD-10-CM | POA: Diagnosis present

## 2015-11-14 DIAGNOSIS — Z87891 Personal history of nicotine dependence: Secondary | ICD-10-CM

## 2015-11-14 DIAGNOSIS — Z96651 Presence of right artificial knee joint: Secondary | ICD-10-CM | POA: Diagnosis present

## 2015-11-14 DIAGNOSIS — I129 Hypertensive chronic kidney disease with stage 1 through stage 4 chronic kidney disease, or unspecified chronic kidney disease: Secondary | ICD-10-CM | POA: Diagnosis present

## 2015-11-14 DIAGNOSIS — M47812 Spondylosis without myelopathy or radiculopathy, cervical region: Secondary | ICD-10-CM | POA: Diagnosis present

## 2015-11-14 DIAGNOSIS — E1151 Type 2 diabetes mellitus with diabetic peripheral angiopathy without gangrene: Secondary | ICD-10-CM | POA: Diagnosis present

## 2015-11-14 DIAGNOSIS — Z01818 Encounter for other preprocedural examination: Secondary | ICD-10-CM

## 2015-11-14 DIAGNOSIS — I2510 Atherosclerotic heart disease of native coronary artery without angina pectoris: Secondary | ICD-10-CM

## 2015-11-14 DIAGNOSIS — N189 Chronic kidney disease, unspecified: Secondary | ICD-10-CM

## 2015-11-14 DIAGNOSIS — N183 Chronic kidney disease, stage 3 (moderate): Secondary | ICD-10-CM | POA: Diagnosis present

## 2015-11-14 DIAGNOSIS — I2111 ST elevation (STEMI) myocardial infarction involving right coronary artery: Secondary | ICD-10-CM | POA: Diagnosis present

## 2015-11-14 DIAGNOSIS — I509 Heart failure, unspecified: Secondary | ICD-10-CM

## 2015-11-14 DIAGNOSIS — Z452 Encounter for adjustment and management of vascular access device: Secondary | ICD-10-CM

## 2015-11-14 DIAGNOSIS — Z95811 Presence of heart assist device: Secondary | ICD-10-CM | POA: Diagnosis not present

## 2015-11-14 DIAGNOSIS — K559 Vascular disorder of intestine, unspecified: Secondary | ICD-10-CM | POA: Diagnosis present

## 2015-11-14 DIAGNOSIS — I4891 Unspecified atrial fibrillation: Secondary | ICD-10-CM | POA: Diagnosis present

## 2015-11-14 DIAGNOSIS — Z515 Encounter for palliative care: Secondary | ICD-10-CM | POA: Diagnosis present

## 2015-11-14 HISTORY — PX: CARDIAC CATHETERIZATION: SHX172

## 2015-11-14 LAB — I-STAT CHEM 8, ED
BUN: 31 mg/dL — AB (ref 6–20)
CALCIUM ION: 0.94 mmol/L — AB (ref 1.13–1.30)
CHLORIDE: 112 mmol/L — AB (ref 101–111)
Creatinine, Ser: 1.7 mg/dL — ABNORMAL HIGH (ref 0.61–1.24)
Glucose, Bld: 137 mg/dL — ABNORMAL HIGH (ref 65–99)
HEMATOCRIT: 34 % — AB (ref 39.0–52.0)
Hemoglobin: 11.6 g/dL — ABNORMAL LOW (ref 13.0–17.0)
Potassium: 4.1 mmol/L (ref 3.5–5.1)
SODIUM: 144 mmol/L (ref 135–145)
TCO2: 19 mmol/L (ref 0–100)

## 2015-11-14 LAB — BASIC METABOLIC PANEL
ANION GAP: 14 (ref 5–15)
BUN: 29 mg/dL — ABNORMAL HIGH (ref 6–20)
CALCIUM: 9.4 mg/dL (ref 8.9–10.3)
CO2: 19 mmol/L — ABNORMAL LOW (ref 22–32)
CREATININE: 1.8 mg/dL — AB (ref 0.61–1.24)
Chloride: 111 mmol/L (ref 101–111)
GFR, EST AFRICAN AMERICAN: 42 mL/min — AB (ref >60)
GFR, EST NON AFRICAN AMERICAN: 36 mL/min — AB (ref >60)
Glucose, Bld: 142 mg/dL — ABNORMAL HIGH (ref 65–99)
Potassium: 4.3 mmol/L (ref 3.5–5.1)
SODIUM: 144 mmol/L (ref 135–145)

## 2015-11-14 LAB — POCT I-STAT TROPONIN I: TROPONIN I, POC: 7.95 ng/mL — AB (ref 0.00–0.08)

## 2015-11-14 LAB — CBC
HCT: 32.9 % — ABNORMAL LOW (ref 39.0–52.0)
HEMOGLOBIN: 10.1 g/dL — AB (ref 13.0–17.0)
MCH: 30.1 pg (ref 26.0–34.0)
MCHC: 30.7 g/dL (ref 30.0–36.0)
MCV: 98.2 fL (ref 78.0–100.0)
PLATELETS: 143 10*3/uL — AB (ref 150–400)
RBC: 3.35 MIL/uL — AB (ref 4.22–5.81)
RDW: 16.4 % — ABNORMAL HIGH (ref 11.5–15.5)
WBC: 10.7 10*3/uL — ABNORMAL HIGH (ref 4.0–10.5)

## 2015-11-14 LAB — TRIGLYCERIDES: Triglycerides: 74 mg/dL (ref <150)

## 2015-11-14 SURGERY — LEFT HEART CATH AND CORS/GRAFTS ANGIOGRAPHY

## 2015-11-14 MED ORDER — SODIUM BICARBONATE 8.4 % IV SOLN
INTRAVENOUS | Status: AC
  Filled 2015-11-14: qty 50

## 2015-11-14 MED ORDER — BIVALIRUDIN 250 MG IV SOLR
INTRAVENOUS | Status: AC
  Filled 2015-11-14: qty 250

## 2015-11-14 MED ORDER — NITROGLYCERIN 1 MG/10 ML FOR IR/CATH LAB
INTRA_ARTERIAL | Status: DC | PRN
  Administered 2015-11-14: 21:00:00

## 2015-11-14 MED ORDER — SODIUM CHLORIDE 0.9% FLUSH
3.0000 mL | Freq: Two times a day (BID) | INTRAVENOUS | Status: DC
  Administered 2015-11-15 – 2015-11-16 (×4): 3 mL via INTRAVENOUS

## 2015-11-14 MED ORDER — MIDAZOLAM HCL 2 MG/2ML IJ SOLN
INTRAMUSCULAR | Status: AC
  Filled 2015-11-14: qty 2

## 2015-11-14 MED ORDER — ACETAMINOPHEN 160 MG/5ML PO SOLN
650.0000 mg | ORAL | Status: DC | PRN
  Administered 2015-11-16: 650 mg
  Filled 2015-11-14: qty 20.3

## 2015-11-14 MED ORDER — DEXTROSE 5 % IV SOLN
4.0000 mg | INTRAVENOUS | Status: DC | PRN
Start: 2015-11-14 — End: 2015-11-14
  Administered 2015-11-14: 30 ug/kg/min via INTRAVENOUS

## 2015-11-14 MED ORDER — SODIUM CHLORIDE 0.9 % IV SOLN
INTRAVENOUS | Status: AC
  Administered 2015-11-14: 23:00:00 via INTRAVENOUS

## 2015-11-14 MED ORDER — EPINEPHRINE HCL 0.1 MG/ML IJ SOSY
PREFILLED_SYRINGE | INTRAMUSCULAR | Status: AC
  Filled 2015-11-14: qty 50

## 2015-11-14 MED ORDER — SODIUM CHLORIDE 0.9 % IV SOLN
1.0000 mg/h | INTRAVENOUS | Status: AC
  Filled 2015-11-14: qty 10

## 2015-11-14 MED ORDER — ACETAMINOPHEN 650 MG RE SUPP: 650.0000 mg | RECTAL | Status: DC | PRN

## 2015-11-14 MED ORDER — ASPIRIN 81 MG PO CHEW
81.0000 mg | CHEWABLE_TABLET | Freq: Every day | ORAL | Status: DC
  Administered 2015-11-15 – 2015-11-16 (×2): 81 mg via ORAL
  Filled 2015-11-14 (×2): qty 1

## 2015-11-14 MED ORDER — HYDROCORTISONE NA SUCCINATE PF 100 MG IJ SOLR
50.0000 mg | Freq: Four times a day (QID) | INTRAMUSCULAR | Status: DC
  Filled 2015-11-14: qty 2

## 2015-11-14 MED ORDER — PANTOPRAZOLE SODIUM 40 MG IV SOLR
40.0000 mg | Freq: Two times a day (BID) | INTRAVENOUS | Status: DC
  Administered 2015-11-15 – 2015-11-16 (×4): 40 mg via INTRAVENOUS
  Filled 2015-11-14 (×4): qty 40

## 2015-11-14 MED ORDER — CHLORHEXIDINE GLUCONATE 0.12% ORAL RINSE (MEDLINE KIT)
15.0000 mL | Freq: Two times a day (BID) | OROMUCOSAL | Status: DC
  Administered 2015-11-14 – 2015-11-16 (×5): 15 mL via OROMUCOSAL

## 2015-11-14 MED ORDER — EPINEPHRINE HCL 0.1 MG/ML IJ SOSY
PREFILLED_SYRINGE | INTRAMUSCULAR | Status: DC | PRN
  Administered 2015-11-14 (×6): 1 mg via INTRAVENOUS

## 2015-11-14 MED ORDER — HYDROCORTISONE NA SUCCINATE PF 100 MG IJ SOLR
50.0000 mg | Freq: Two times a day (BID) | INTRAMUSCULAR | Status: DC
  Administered 2015-11-15 – 2015-11-16 (×4): 50 mg via INTRAVENOUS
  Filled 2015-11-14 (×4): qty 2

## 2015-11-14 MED ORDER — ANTISEPTIC ORAL RINSE SOLUTION (CORINZ)
7.0000 mL | Freq: Four times a day (QID) | OROMUCOSAL | Status: DC
  Administered 2015-11-15 – 2015-11-17 (×10): 7 mL via OROMUCOSAL

## 2015-11-14 MED ORDER — MIDAZOLAM HCL 2 MG/2ML IJ SOLN
INTRAMUSCULAR | Status: DC | PRN
  Administered 2015-11-14 (×2): 1 mg via INTRAVENOUS
  Administered 2015-11-14: 2 mg via INTRAVENOUS

## 2015-11-14 MED ORDER — VERAPAMIL HCL 2.5 MG/ML IV SOLN
INTRAVENOUS | Status: DC | PRN
  Administered 2015-11-14: 20:00:00 via INTRA_ARTERIAL

## 2015-11-14 MED ORDER — FENTANYL CITRATE (PF) 100 MCG/2ML IJ SOLN: 50.0000 ug | INTRAMUSCULAR | Status: DC | PRN

## 2015-11-14 MED ORDER — FENTANYL CITRATE (PF) 100 MCG/2ML IJ SOLN
50.0000 ug | INTRAMUSCULAR | Status: AC | PRN
  Administered 2015-11-14 – 2015-11-15 (×3): 50 ug via INTRAVENOUS
  Filled 2015-11-14 (×2): qty 2

## 2015-11-14 MED ORDER — NOREPINEPHRINE BITARTRATE 1 MG/ML IV SOLN
2.0000 ug/min | INTRAVENOUS | Status: DC
  Filled 2015-11-14: qty 4

## 2015-11-14 MED ORDER — PROPOFOL 1000 MG/100ML IV EMUL
INTRAVENOUS | Status: AC
  Administered 2015-11-14: 1000 mg
  Filled 2015-11-14: qty 100

## 2015-11-14 MED ORDER — DOPAMINE-DEXTROSE 3.2-5 MG/ML-% IV SOLN
2.0000 ug/kg/min | INTRAVENOUS | Status: DC
  Administered 2015-11-14: 5 ug/kg/min via INTRAVENOUS

## 2015-11-14 MED ORDER — ONDANSETRON HCL 4 MG/2ML IJ SOLN
4.0000 mg | Freq: Four times a day (QID) | INTRAMUSCULAR | Status: DC | PRN
  Administered 2015-11-16 (×2): 4 mg via INTRAVENOUS
  Filled 2015-11-14 (×2): qty 2

## 2015-11-14 MED ORDER — HEPARIN (PORCINE) IN NACL 2-0.9 UNIT/ML-% IJ SOLN
INTRAMUSCULAR | Status: AC
  Filled 2015-11-14: qty 1500

## 2015-11-14 MED ORDER — MIDAZOLAM HCL 2 MG/2ML IJ SOLN
INTRAMUSCULAR | Status: DC | PRN
  Administered 2015-11-14: 1 mg via INTRAVENOUS

## 2015-11-14 MED ORDER — CLOPIDOGREL BISULFATE 300 MG PO TABS
600.0000 mg | ORAL_TABLET | Freq: Once | ORAL | Status: AC
  Administered 2015-11-14: 600 mg via ORAL
  Filled 2015-11-14: qty 2

## 2015-11-14 MED ORDER — NOREPINEPHRINE BITARTRATE 1 MG/ML IV SOLN
INTRAVENOUS | Status: AC
  Filled 2015-11-14: qty 4

## 2015-11-14 MED ORDER — TOFACITINIB CITRATE 5 MG PO TABS: 5.0000 mg | ORAL_TABLET | Freq: Every day | ORAL | Status: DC

## 2015-11-14 MED ORDER — SODIUM CHLORIDE 0.9 % IV SOLN
10.0000 ug/h | INTRAVENOUS | Status: DC
  Filled 2015-11-14: qty 50

## 2015-11-14 MED ORDER — SODIUM CHLORIDE 0.9 % IV SOLN
250.0000 mL | INTRAVENOUS | Status: DC | PRN
  Administered 2015-11-15: 250 mL via INTRAVENOUS

## 2015-11-14 MED ORDER — INSULIN ASPART 100 UNIT/ML ~~LOC~~ SOLN
2.0000 [IU] | SUBCUTANEOUS | Status: DC
  Administered 2015-11-15: 2 [IU] via SUBCUTANEOUS
  Administered 2015-11-15 – 2015-11-16 (×4): 4 [IU] via SUBCUTANEOUS
  Administered 2015-11-16: 2 [IU] via SUBCUTANEOUS
  Administered 2015-11-16: 4 [IU] via SUBCUTANEOUS
  Administered 2015-11-16: 2 [IU] via SUBCUTANEOUS
  Administered 2015-11-16: 4 [IU] via SUBCUTANEOUS

## 2015-11-14 MED ORDER — BIVALIRUDIN BOLUS VIA INFUSION - CUPID
INTRAVENOUS | Status: DC | PRN
  Administered 2015-11-14: 46.575 mg via INTRAVENOUS

## 2015-11-14 MED ORDER — FENTANYL CITRATE (PF) 100 MCG/2ML IJ SOLN
INTRAMUSCULAR | Status: AC
  Filled 2015-11-14: qty 2

## 2015-11-14 MED ORDER — PROPOFOL 1000 MG/100ML IV EMUL
0.0000 ug/kg/min | INTRAVENOUS | Status: DC
  Administered 2015-11-14: 10 ug/kg/min via INTRAVENOUS
  Administered 2015-11-15: 20 ug/kg/min via INTRAVENOUS
  Filled 2015-11-14: qty 100

## 2015-11-14 MED ORDER — HEPARIN SODIUM (PORCINE) 5000 UNIT/ML IJ SOLN
60.0000 [IU]/kg | Freq: Once | INTRAMUSCULAR | Status: AC
  Administered 2015-11-14: 3750 [IU] via INTRAVENOUS
  Filled 2015-11-14: qty 1

## 2015-11-14 MED ORDER — NOREPINEPHRINE BITARTRATE 1 MG/ML IV SOLN
2.0000 ug/min | INTRAVENOUS | Status: DC
  Administered 2015-11-14: 12 ug/min via INTRAVENOUS
  Administered 2015-11-15: 22 ug/min via INTRAVENOUS
  Administered 2015-11-15: 28 ug/min via INTRAVENOUS
  Administered 2015-11-15: 25 ug/min via INTRAVENOUS
  Administered 2015-11-15: 5 ug/min via INTRAVENOUS
  Administered 2015-11-15: 18 ug/min via INTRAVENOUS
  Administered 2015-11-16: 40 ug/min via INTRAVENOUS
  Administered 2015-11-16: 45 ug/min via INTRAVENOUS
  Administered 2015-11-16: 8 ug/min via INTRAVENOUS
  Administered 2015-11-16: 40 ug/min via INTRAVENOUS
  Administered 2015-11-16: 50 ug/min via INTRAVENOUS
  Administered 2015-11-16: 42 ug/min via INTRAVENOUS
  Administered 2015-11-17 (×4): 50 ug/min via INTRAVENOUS
  Filled 2015-11-14 (×17): qty 4

## 2015-11-14 MED ORDER — SODIUM CHLORIDE 0.9% FLUSH: 3.0000 mL | INTRAVENOUS | Status: DC | PRN

## 2015-11-14 MED ORDER — ACETAMINOPHEN 325 MG PO TABS: 650.0000 mg | ORAL_TABLET | ORAL | Status: DC | PRN

## 2015-11-14 MED ORDER — PANTOPRAZOLE SODIUM 40 MG IV SOLR
40.0000 mg | Freq: Every day | INTRAVENOUS | Status: DC
  Filled 2015-11-14: qty 40

## 2015-11-14 MED ORDER — HEPARIN (PORCINE) IN NACL 100-0.45 UNIT/ML-% IJ SOLN
200.0000 [IU]/h | INTRAMUSCULAR | Status: DC
  Administered 2015-11-15: 200 [IU]/h via INTRAVENOUS
  Filled 2015-11-14: qty 250

## 2015-11-14 MED ORDER — LIDOCAINE HCL (PF) 1 % IJ SOLN
INTRAMUSCULAR | Status: AC
  Filled 2015-11-14: qty 30

## 2015-11-14 MED ORDER — DOPAMINE-DEXTROSE 3.2-5 MG/ML-% IV SOLN
INTRAVENOUS | Status: DC | PRN
  Administered 2015-11-14: 20 ug/kg/min via INTRAVENOUS

## 2015-11-14 MED ORDER — IOHEXOL 350 MG/ML SOLN
INTRAVENOUS | Status: DC | PRN
  Administered 2015-11-14: 55 mL via INTRA_ARTERIAL

## 2015-11-14 MED ORDER — DOPAMINE-DEXTROSE 3.2-5 MG/ML-% IV SOLN
INTRAVENOUS | Status: DC | PRN
  Administered 2015-11-14: 10 ug/kg/min via INTRAVENOUS

## 2015-11-14 MED ORDER — ASPIRIN 81 MG PO CHEW
324.0000 mg | CHEWABLE_TABLET | Freq: Once | ORAL | Status: AC
  Administered 2015-11-14: 324 mg via ORAL
  Filled 2015-11-14: qty 4

## 2015-11-14 MED ORDER — VERAPAMIL HCL 2.5 MG/ML IV SOLN
INTRAVENOUS | Status: AC
  Filled 2015-11-14: qty 2

## 2015-11-14 MED ORDER — DOPAMINE-DEXTROSE 3.2-5 MG/ML-% IV SOLN
INTRAVENOUS | Status: AC
  Filled 2015-11-14: qty 250

## 2015-11-14 MED ORDER — SODIUM BICARBONATE 8.4 % IV SOLN
INTRAVENOUS | Status: DC | PRN
  Administered 2015-11-14 (×2): 50 meq via INTRAVENOUS

## 2015-11-14 MED ORDER — LEVOTHYROXINE SODIUM 100 MCG IV SOLR
44.0000 ug | Freq: Every day | INTRAVENOUS | Status: DC
  Administered 2015-11-15 – 2015-11-16 (×2): 44 ug via INTRAVENOUS
  Filled 2015-11-14 (×2): qty 5

## 2015-11-14 MED ORDER — EPINEPHRINE HCL 0.1 MG/ML IJ SOSY
PREFILLED_SYRINGE | INTRAMUSCULAR | Status: AC
  Filled 2015-11-14: qty 10

## 2015-11-14 MED ORDER — SODIUM CHLORIDE 0.9 % IV SOLN
1.7500 mg/kg/h | INTRAVENOUS | Status: AC
  Administered 2015-11-14: 1.75 mg/kg/h via INTRAVENOUS
  Filled 2015-11-14 (×2): qty 250

## 2015-11-14 MED ORDER — FENTANYL CITRATE (PF) 100 MCG/2ML IJ SOLN
INTRAMUSCULAR | Status: DC | PRN
  Administered 2015-11-14: 75 ug via INTRAVENOUS
  Administered 2015-11-14: 25 ug via INTRAVENOUS

## 2015-11-14 MED ORDER — SODIUM CHLORIDE 0.9 % IV SOLN
250.0000 mg | INTRAVENOUS | Status: DC | PRN
  Administered 2015-11-14 (×2): 1.75 mg/kg/h via INTRAVENOUS

## 2015-11-14 MED ORDER — NITROGLYCERIN 1 MG/10 ML FOR IR/CATH LAB
INTRA_ARTERIAL | Status: AC
  Filled 2015-11-14: qty 10

## 2015-11-14 MED ORDER — HEPARIN SODIUM (PORCINE) 5000 UNIT/ML IJ SOLN
50000.0000 [IU] | INTRAMUSCULAR | Status: DC
  Administered 2015-11-14: 50000 [IU]
  Filled 2015-11-14 (×2): qty 10

## 2015-11-14 SURGICAL SUPPLY — 19 items
BALLN EMERGE MR 2.5X12 (BALLOONS) ×3
BALLOON EMERGE MR 2.5X12 (BALLOONS) IMPLANT
CATH INFINITI 5 FR IM (CATHETERS) ×2 IMPLANT
CATH INFINITI 5FR ANG PIGTAIL (CATHETERS) ×3 IMPLANT
CATH VISTA GUIDE 6FR AL2 (CATHETERS) ×2 IMPLANT
DEVICE RAD COMP TR BAND LRG (VASCULAR PRODUCTS) ×3 IMPLANT
GLIDESHEATH SLEND SS 6F .021 (SHEATH) ×3 IMPLANT
GUIDE CATH RUNWAY 6FR LCB (CATHETERS) ×2 IMPLANT
KIT ENCORE 26 ADVANTAGE (KITS) ×2 IMPLANT
KIT HEART LEFT (KITS) ×3 IMPLANT
PACK CARDIAC CATHETERIZATION (CUSTOM PROCEDURE TRAY) ×3 IMPLANT
SET IMPELLA CP PUMP (CATHETERS) ×2 IMPLANT
SHEATH PINNACLE 5F 10CM (SHEATH) ×2 IMPLANT
STENT SYNERGY DES 3.5X24 (Permanent Stent) ×2 IMPLANT
TRANSDUCER W/STOPCOCK (MISCELLANEOUS) ×3 IMPLANT
TUBING CIL FLEX 10 FLL-RA (TUBING) ×3 IMPLANT
WIRE COUGAR XT STRL 190CM (WIRE) ×2 IMPLANT
WIRE EMERALD 3MM-J .035X150CM (WIRE) ×2 IMPLANT
WIRE SAFE-T 1.5MM-J .035X260CM (WIRE) ×3 IMPLANT

## 2015-11-14 NOTE — ED Provider Notes (Signed)
Pt seen in triage after reviewing EKG. Inferior STE with recip changes. Endorses active CP, but reports has actually been having for last week. Code STEMI activated.   Raeford Razor, MD 11/05/2015 [phone]

## 2015-11-14 NOTE — Telephone Encounter (Signed)
DPR contact, and Cardiac Nurse herself, Dorna Leitz told me pateient has had intermittent CP for the past 3 days,he has a high pain tolerance due to arthritis and usually never complains.BP is ususally 120/sys,today 146/103.He does not have NTG. Suggested he could go to ED or make apt in offfice,they chose ED. With previous MI he had no CP    Will message Dr.Branch

## 2015-11-14 NOTE — H&P (Signed)
History and Physical  Patient ID: [name] MRN: [id number], SOB: 1945-12-17 70 y.o. Date of Encounter: 11-28-15, 9:30 PM  Primary Physician: Lubertha South, MD Primary Cardiologist: Dr Wyline Mood Chief Complaint: Chest pain  HPI: 70 y.o. male w/ PMHx significant for CAD s/p remote CABG who presented to [facility] on Nov 28, 2015 with complaints of chest pain.  The patient has been having intermittent chest pain now for about 5 days. Today his pain was very severe and he rated it at 10/10. He has a very complex recent medical history with an acute stroke in October 2016, pulmonary fibrosis related to chronic methotrexate for rheumatoid arthritis, atrial fibrillation, and a recent spontaneous flank hematoma requiring cessation of aspirin and Plavix in January 2017. The patient reports waxing and waning of chest pain that he describes as someone standing on his chest now for several days. There is associated shortness of breath with this. Today his pain became severe and he presented to the [facility] emergency room. An EKG demonstrated acute posterior injury and a code STEMI was called. He is brought emergently for cardiac catheterization. He has no other acute complaints. At the time of my interview he rates his chest pain at 3/10.   Past Medical History  Diagnosis Date  . 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 Hyperlipidemia   . Hypertension   . PVD (peripheral vascular disease) (HCC)     Left iliac PCI/stent  . Cerebrovascular disease   . Tobacco abuse, in remission     Remote  . GERD (gastroesophageal reflux disease)   . Hypothyroid   . Allergic rhinitis   . Chronic lung disease     ?  Rheumatoid lung; ?  Adverse reaction to methotrexate  . Schatzki's ring   . Hx of Clostridium difficile infection     Recurrent; associated 40 pound weight loss  . Achalasia   . Diabetes mellitus     borderline no meds  . Atrial fibrillation (HCC)   . Chronic  back pain   . 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 Cervical spondylosis   . DDD (degenerative disc disease), lumbar   . Pneumonia   . Carotid artery occlusion   . Nephrolithiasis 2010    s/p lithotripsy  . Hiatal hernia      Surgical History:  Past Surgical History  Procedure Laterality Date  . Coronary artery bypass graft      x6 In 1998  . Total knee arthroplasty      Right  . Ankle fusion      Left  . Wrist fusion      Right  . Shoulder surgery    . Colonoscopy  09/12/2011    Dr. Elly Modena hemorrhoids, normal colon and distal terminal ileum. Random bx negative. Stool for CDiff positive.  . Esophagogastroduodenoscopy  09/13/2011    Dr. Lyndle Herrlich plawues mid-esophagus KOH negative. Distal esophageal ring and ulcer. Mild gastritis. duodenal diverticulum. Savaory dilation 96mm.   Gaspar Bidding dilation  09/13/2011  . Esophageal manometry  09/30/2011    Procedure: ESOPHAGEAL MANOMETRY (EM);  Surgeon: Rob Bunting, MD;  Location: [facility] ENDOSCOPY;  Service: Endoscopy;  Laterality: N/A;  . Esophagogastroduodenoscopy  09/12/2011    Dr. Rinaldo Ratel rings s/p dilation  . Cardiac surgery    . Esophagomyotomy  11/12/11    [facility] with DOR antireflux surgery  . Esophagogastroduodenoscopy  06/25/2012    Dr. Jena Gauss: erosive reflux esophagitis, patent esophagus s/p  malony dilation, gastric erosions/ulcerations consistent with NSAID gastropathy, neg H.pylori   . Savory dilation  06/25/2012    Procedure: SAVORY DILATION;  Surgeon: Corbin Ade, MD;  Location: [facility] ENDO SUITE;  Service: Endoscopy;  Laterality: N/A;  Elease Hashimoto dilation  06/25/2012    Procedure: Elease Hashimoto DILATION;  Surgeon: Corbin Ade, MD;  Location: [facility] ENDO SUITE;  Service: Endoscopy;  Laterality: N/A;  . Ankle fusion  09/01/2012    Procedure: ANKLE FUSION;  Surgeon: Valeria Batman, MD;  Location: [facility] OR;  Service: Orthopedics;  Laterality: Left;  Take down of angulated tibial/fibula Fractures  .  Femoral-tibial bypass graft Left 01/28/2013    Procedure: BYPASS GRAFT FEMORAL-TIBIAL ARTERY;  Surgeon: Nada Libman, MD;  Location: [facility] OR;  Service: Vascular;  Laterality: Left;  Ultrasound Guided  . Amputation Left 01/28/2013    Procedure: AMPUTATION LEFT 5TH TOE;  Surgeon: Nada Libman, MD;  Location: [facility] OR;  Service: Vascular;  Laterality: Left;  . Spine surgery  11-03-13  . Abdominal aortagram N/A 01/01/2013    Procedure: ABDOMINAL Ronny Flurry;  Surgeon: Sherren Kerns, MD;  Location: [facility] CATH LAB;  Service: Cardiovascular;  Laterality: N/A;  . Abdominal aortagram N/A 10/05/2013    Procedure: ABDOMINAL AORTAGRAM;  Surgeon: Nada Libman, MD;  Location: [facility] CATH LAB;  Service: Cardiovascular;  Laterality: N/A;  . Lower extremity angiogram Left 10/05/2013    Procedure: LOWER EXTREMITY ANGIOGRAM;  Surgeon: Nada Libman, MD;  Location: [facility] CATH LAB;  Service: Cardiovascular;  Laterality: Left;  . Abdominal aortagram N/A 11/01/2014    Procedure: ABDOMINAL Ronny Flurry;  Surgeon: Nada Libman, MD;  Location: [facility] CATH LAB;  Service: Cardiovascular;  Laterality: N/A;  . Joint replacement Right 1999    Knee  . Toe amputation Left 09/05/2014    Removed left fifth toe  . Cataract extraction w/phaco Left 12/22/2014    Procedure: CATARACT EXTRACTION PHACO AND INTRAOCULAR LENS PLACEMENT (IOC);  Surgeon: Gemma Payor, MD;  Location: [facility] ORS;  Service: Ophthalmology;  Laterality: Left;  CDE:11.89  . Cataract extraction w/phaco Right 01/30/2015    Procedure: CATARACT EXTRACTION PHACO AND INTRAOCULAR LENS PLACEMENT RIGHT EYE;  Surgeon: Gemma Payor, MD;  Location: [facility] ORS;  Service: Ophthalmology;  Laterality: Right;  CDE:10.56  . Eye surgery Left December 22, 2014    Cataract  . Eye surgery Right December 31, 2014    Cataract     Home Meds: Prior to Admission medications   Medication Sig Start Date End Date Taking? Authorizing Provider  ALPRAZolam (XANAX) 1 MG tablet TAKE 1 TABLET BY MOUTH ONCE DAILY AS NEEDED FOR  ANXIETY. Patient taking differently: TAKE 1 TABLET BY MOUTH ONCE DAILY AT BEDTIME 05/11/15   Merlyn Albert, MD  atenolol (TENORMIN) 25 MG tablet TAKE 1 TABLET BY MOUTH TWICE DAILY 11/13/15   Babs Sciara, MD  atorvastatin (LIPITOR) 80 MG tablet TAKE ONE TABLET BY MOUTH ONCE DAILY. 08/14/15   Antoine Poche, MD  diphenoxylate-atropine (LOMOTIL) 2.5-0.025 MG tablet Take 1 tablet by mouth 3 (three) times daily as needed for diarrhea or loose stools. 10/25/15   Babs Sciara, MD  finasteride (PROSCAR) 5 MG tablet Take 5 mg by mouth daily.    Historical Provider, MD  folic acid (FOLVITE) 1 MG tablet Take 1 mg by mouth daily.    Historical Provider, MD  furosemide (LASIX) 20 MG tablet Take 20 mg by mouth daily as needed for fluid.    Historical Provider, MD  hydrALAZINE (  APRESOLINE) 25 MG tablet Take 1.5 tablets (37.5 mg total) by mouth every 8 (eight) hours. 09/07/15   Henderson Cloud, MD  HYDROcodone-acetaminophen (NORCO) 10-325 MG tablet Take 1 tablet by mouth 4 (four) times daily. *May take one additional tablet daily as needed for pain    Historical Provider, MD  isosorbide mononitrate (IMDUR) 30 MG 24 hr tablet Take 1 tablet (30 mg total) by mouth daily. 09/07/15   Henderson Cloud, MD  levothyroxine (SYNTHROID, LEVOTHROID) 88 MCG tablet TAKE ONE TABLET BY MOUTH ONCE DAILY. 09/14/15   Merlyn Albert, MD  losartan (COZAAR) 25 MG tablet Take 1 tablet (25 mg total) by mouth daily. 07/05/15   Antoine Poche, MD  Melatonin 3 MG TABS Take 3-6 mg by mouth at bedtime as needed.     Historical Provider, MD  Multiple Vitamin (MULTIVITAMIN) capsule Take 1 capsule by mouth daily.    Historical Provider, MD  pantoprazole (PROTONIX) 40 MG tablet Take 1 tablet (40 mg total) by mouth 2 (two) times daily before a meal. 09/07/15   Estela Isaiah Blakes, MD  predniSONE (DELTASONE) 10 MG tablet TAKE ONE TABLET BY MOUTH ONCE DAILY. 04/04/15   Merlyn Albert, MD  Probiotic Product  (FLORAJEN3 PO) Take 1 capsule by mouth at bedtime.    Historical Provider, MD  tamsulosin (FLOMAX) 0.4 MG CAPS capsule Take 0.4 mg by mouth every evening.     Historical Provider, MD  Tofacitinib Citrate (XELJANZ) 5 MG TABS Take by mouth once.    Historical Provider, MD  Vitamin D, Ergocalciferol, (DRISDOL) 50000 UNITS CAPS capsule Take 50,000 Units by mouth every 7 (seven) days. On Monday.    Historical Provider, MD    Allergies:  Allergies  Allergen Reactions  . Clindamycin/Lincomycin Other (See Comments)    Kidney Failure  . Amoxicillin Hives and Other (See Comments)    Hallucinations   . Ciprofloxacin Other (See Comments)    C-diff  . Other Hives, Diarrhea and Other (See Comments)    "Mycin" Causes C-diff  . Ceftin [Cefuroxime Axetil]     Taste like rusty iron, nausea,  . Doxycycline Nausea And Vomiting  . Levofloxacin Nausea And Vomiting    Social History   Social History  . Marital Status: Married    Spouse Name: N/A  . Number of Children: 1  . Years of Education: N/A   Occupational History  . retired [employer]    Social History Main Topics  . Smoking status: Former Smoker -- 0.50 packs/day for 5 years    Types: Cigarettes    Start date: 09/23/1956    Quit date: 09/23/1965  . Smokeless tobacco: Former Neurosurgeon  . Alcohol Use: No  . Drug Use: No  . Sexual Activity: Not Currently   Other Topics Concern  . Not on file   Social History Narrative     Family History  Problem Relation Age of Onset  . Stomach cancer Mother 5  . Cancer Mother     Stomach  . Heart disease Mother   . Heart attack Mother   . Heart attack Father   . Heart disease Father   . Heart disease Brother   . Leukemia Brother   . Lung disease Son     infant  . Lung disease Daughter     infant  . Colon cancer Neg Hx     Review of Systems: General: negative for chills, fever, night sweats or weight changes.  ENT: negative  for rhinorrhea or epistaxis Cardiovascular: see  HPI Dermatological: negative for rash Respiratory: positive for shortness of breath GI: positive for anorexia, nausea GU: no hematuria, urgency, or frequency Neurologic: negative for visual changes, syncope, headache. Positive for dizziness.  Heme: no easy bruising or bleeding Endo: negative for excessive thirst, thyroid disorder, or flushing Musculoskeletal: positive for elbow, shoulder and leg pain All other systems reviewed and are otherwise negative except as noted above.  Physical Exam: Blood pressure 148/86, pulse 98, temperature 98.4 F (36.9 C), temperature source Oral, resp. rate 21, height 5\' 6"  (1.676 m), weight 62.143 kg (137 lb), SpO2 0 %. General: Chronically ill-appearing male, pale skin, mild distress HEENT: Normocephalic, atraumatic, sclera anicteric Neck: Supple. Carotids 2+ . JVP normal Lungs: Clear bilaterally to auscultation without wheezes, rales, or rhonchi. Breathing is unlabored. Heart: RRR with normal S1 and S2. No murmurs, rubs, or gallops appreciated. Distant heart sounds Abdomen: Soft, non-tender, non-distended with normoactive bowel sounds. No hepatomegaly. No rebound/guarding. No obvious abdominal masses. Back: No CVA tenderness, extensive bruising over the left flank and back area noted Msk:  Advanced rheumatoid changes noted Extremities: 1+ bilateral pretibial edema.  Distal pedal pulses are 2+ and equal bilaterally. Neuro: CNII-XII intact, moves all extremities spontaneously. Psych:  Responds to questions appropriately with a normal affect. Skin: warm and dry without rash   Labs:   Lab Results  Component Value Date   WBC 10.7* 2015-12-10   HGB 10.1* 12/10/15   HGB 11.6* 2015/12/10   HCT 32.9* 12-10-2015   HCT 34.0* Dec 10, 2015   MCV 98.2 10-Dec-2015   PLT 143* 12/10/15    Recent Labs Lab 12/10/15 1906  NA 144  144  K 4.3  4.1  CL 111  112*  CO2 19*  BUN 29*  31*  CREATININE 1.80*  1.70*  CALCIUM 9.4  GLUCOSE 142*  137*   No  results for input(s): CKTOTAL, CKMB, TROPONINI in the last 72 hours. Lab Results  Component Value Date   CHOL 186 06/25/2015   HDL 34* 06/25/2015   LDLCALC 97 06/25/2015   TRIG 273* 06/25/2015   No results found for: DDIMER  Radiology/Studies:  Ct Abdomen Pelvis Wo Contrast  10/19/2015  CLINICAL DATA:  Patient woke up this morning and noticed a large bruise on the left flank area. Does not recall any recent injuries. Elevated creatinine. Anemia. EXAM: CT ABDOMEN AND PELVIS WITHOUT CONTRAST TECHNIQUE: Multidetector CT imaging of the abdomen and pelvis was performed following the standard protocol without IV contrast. COMPARISON:  09/02/2015 FINDINGS: Bronchiectasis, scarring, and emphysematous changes in the lung bases. Postoperative changes in the mediastinum. The rib small esophageal hiatal hernia. There is infiltration in the subcutaneous fatty tissues over the left posterior flank area with enlargement of the flank muscles consistent with intramuscular and soft tissue hematoma. There are healing fractures of the left posterior eleventh and twelfth ribs. Healing fractures also demonstrated in the posterior right tenth, eleventh and twelfth ribs. Acute appearing fractures in the right L2 and L3 transverse processes. Bilateral rib fractures are present on prior study. The right transverse process fractures are new. The unenhanced appearance of the liver, spleen, pancreas, adrenal glands, inferior vena cava, and retroperitoneal lymph nodes is unremarkable. Cholelithiasis without inflammatory changes in the gallbladder. Scattered calcified granulomas in the liver. Calcifications in the renal hila bilaterally likely representing multiple stones. No hydronephrosis or hydroureter. Cyst in the lower pole of the right kidney. Diffuse parenchymal atrophy. Atherosclerotic calcification throughout the abdominal aorta and iliac arteries. No  aortic aneurysm. Stomach, small bowel, and colon are decompressed. No free  air or free fluid in the abdomen. Pelvis: Appendix is normal. Bladder wall is mildly thickened possibly indicating infection. Prostate gland is not enlarged. Scattered diverticula in the sigmoid colon without evidence of diverticulitis. No free or loculated pelvic fluid collections. No pelvic mass or lymphadenopathy. Scoliosis and degenerative changes in the lumbar spine. Postoperative posterior fixation from L4 to the sacrum. There is a bone lesion demonstrated in the left anterior iliac spine with bone destruction and associated soft tissue process. This was present on previous studies dating back to 2014, suggesting benign etiology. This could represent postoperative change. IMPRESSION: Intramuscular and soft tissue hematoma along the left flank region. Healing bilateral lower rib fractures were present on previous study. New fractures of the right L2 and L3 transverse processes. Old destructive soft tissue bone lesion along the left anterior iliac spine, possibly postoperative. Bronchiectasis and fibrosis in the lung bases. Cholelithiasis. Nonobstructing bilateral renal stones. Bladder wall thickening possibly indicating infection. Electronically Signed   By: Burman Nieves M.D.   On: 10/19/2015 22:27   Dg Chest 2 View  10/19/2015  CLINICAL DATA:  Bruising around left lying. Shortness of breath since Tuesday. EXAM: CHEST  2 VIEW COMPARISON:  Chest x-ray dated 09/02/2015. FINDINGS: Cardiomegaly is stable. The probable mild scarring/fibrosis within each lung is unchanged. No new lung findings. No confluent airspace opacity to suggest a developing pneumonia. No pleural effusion seen. No pneumothorax seen. Median sternotomy wires are stable in alignment and configuration. There appear to be slightly displaced fractures of the right lateral seventh and eighth ribs, of uncertain age but most likely chronic. No left-sided rib fracture identified. Mild degenerative change again noted within the thoracolumbar spine.  IMPRESSION: 1. Cardiomegaly, stable. No evidence of significant volume overload/CHF. 2. Bilateral scarring/fibrosis, stable. No acute lung findings seen. No evidence of pneumonia. No pleural effusion. No pneumothorax seen. 3. Slightly displaced fracture within the right lateral seventh and/or eighth ribs. This is of uncertain age but most likely old. Any right-sided rib pain? No left-sided rib fracture seen. Electronically Signed   By: Bary Richard M.D.   On: 10/19/2015 19:46     EKG: NSR with acute inferoposterior MI  CARDIAC STUDIES: none  ASSESSMENT AND PLAN:  1. Acute STEMI, inferoposterior wall: The patient has multiple chronic medical problems. However, he clearly has an acute STEMI and with ongoing chest pain will take emergently to the cardiac catheterization lab for cardiac catheterization and PCI. The procedure is explained in detail to the patient who understands and agrees to proceed. Emergency implied consent is obtained. He has received 4000 units of unfractionated heparin and aspirin prior to arrival. The patient has known coronary artery disease with remote CABG.  2. Left flank hematoma: Patient had a spontaneous hematoma last month and he has been off of aspirin and Plavix since this. He's had 2 major bleeding episodes in the past 6 months with a spontaneous flank hematoma and a GI bleed.  3. Rheumatoid arthritis on chronic steroids: Will need pulse dose steroids in the setting of his acute illness and myocardial infarction because of secondary adrenal insufficiency.  4. Anemia, recent bleeding noted. Initial hemoglobin is stable from baseline.  5. Chronic kidney disease stage III: Will have to follow renal function closely around the time of contrast administration/cardiac catheterization  6. Chronic interstitial lung disease  7. History of stroke  The patient has multiple serious comorbid medical conditions. I have spoken with his  family and we will do our best at treating  his acute myocardial infarction. I have reviewed his previous cardiac catheterization study from 2009 which demonstrated patency of the LIMA graft to LAD and first diagonal as well as a sequential vein graft to the OM1 and OM 2. His native coronaries are chronically occluded. His vein graft to the right coronary artery is chronically occluded. I suspect he has critical stenosis of the saphenous vein graft sequential to the circumflex.  Enzo Bi MD 10/25/2015, 9:30 PM

## 2015-11-14 NOTE — ED Provider Notes (Addendum)
CSN: [id number]     Arrival date & time 11/06/2015  1831 History   First MD Initiated Contact with Patient 10/28/2015 1856     Chief Complaint  Patient presents with  . Chest Pain      HPI  Patient presents for evaluation of chest pain. States that he has had chest pain intermittently since Thursday, today is Tuesday.  Past medical history significant for 6 vessel CABG in 1998. Continued hypertension, not diabetic, positive hypercholesterolemia. Has not had repeat angiogram or cardiac interventions since 1998. Daughter states he had a "non-STEMI" last year. Do not undergo angiogram at that time.  Patient has some bruising on his left anterior lateral chest wall. Daughter states that he had a spontaneous "muscle hemorrhage" and was taken off of aspirin and Plavix a month ago.  Approximately 45 minutes ago, he develops severe chest pain. He points at me and states "it feels like you're standing on my chest". No shortness of breath. No nausea.  Past Medical History  Diagnosis Date  . 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 Hyperlipidemia   . Hypertension   . PVD (peripheral vascular disease) (HCC)     Left iliac PCI/stent  . Cerebrovascular disease   . Tobacco abuse, in remission     Remote  . GERD (gastroesophageal reflux disease)   . Hypothyroid   . Allergic rhinitis   . Chronic lung disease     ?  Rheumatoid lung; ?  Adverse reaction to methotrexate  . Schatzki's ring   . Hx of Clostridium difficile infection     Recurrent; associated 40 pound weight loss  . Achalasia   . Diabetes mellitus     borderline no meds  . Atrial fibrillation (HCC)   . Chronic back pain   . 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 Cervical spondylosis   . DDD (degenerative disc disease), lumbar   . Pneumonia   . Carotid artery occlusion   . Nephrolithiasis 2010    s/p lithotripsy  . Hiatal hernia    Past Surgical History  Procedure Laterality Date  . Coronary  artery bypass graft      x6 In 1998  . Total knee arthroplasty      Right  . Ankle fusion      Left  . Wrist fusion      Right  . Shoulder surgery    . Colonoscopy  09/12/2011    Dr. Elly Modena hemorrhoids, normal colon and distal terminal ileum. Random bx negative. Stool for CDiff positive.  . Esophagogastroduodenoscopy  09/13/2011    Dr. Lyndle Herrlich plawues mid-esophagus KOH negative. Distal esophageal ring and ulcer. Mild gastritis. duodenal diverticulum. Savaory dilation 47mm.   Gaspar Bidding dilation  09/13/2011  . Esophageal manometry  09/30/2011    Procedure: ESOPHAGEAL MANOMETRY (EM);  Surgeon: Rob Bunting, MD;  Location: [facility];  Service: Endoscopy;  Laterality: N/A;  . Esophagogastroduodenoscopy  09/12/2011    Dr. Rinaldo Ratel rings s/p dilation  . Cardiac surgery    . Esophagomyotomy  11/12/11    [facility]- with DOR antireflux surgery  . Esophagogastroduodenoscopy  06/25/2012    Dr. Jena Gauss: erosive reflux esophagitis, patent esophagus s/p malony dilation, gastric erosions/ulcerations consistent with NSAID gastropathy, neg H.pylori   . Savory dilation  06/25/2012    Procedure: SAVORY DILATION;  Surgeon: Corbin Ade, MD;  Location: [facility];  Service: Endoscopy;  Laterality: N/A;  Elease Hashimoto dilation  06/25/2012    Procedure:  MALONEY DILATION;  Surgeon: Corbin Ade, MD;  Location: [facility];  Service: Endoscopy;  Laterality: N/A;  . Ankle fusion  09/01/2012    Procedure: ANKLE FUSION;  Surgeon: Valeria Batman, MD;  Location: [facility] OR;  Service: Orthopedics;  Laterality: Left;  Take down of angulated tibial/fibula Fractures  . Femoral-tibial bypass graft Left 01/28/2013    Procedure: BYPASS GRAFT FEMORAL-TIBIAL ARTERY;  Surgeon: Nada Libman, MD;  Location: [facility] OR;  Service: Vascular;  Laterality: Left;  Ultrasound Guided  . Amputation Left 01/28/2013    Procedure: AMPUTATION LEFT 5TH TOE;  Surgeon: Nada Libman, MD;  Location: [facility] OR;  Service: Vascular;   Laterality: Left;  . Spine surgery  11-03-13  . Abdominal aortagram N/A 01/01/2013    Procedure: ABDOMINAL Ronny Flurry;  Surgeon: Sherren Kerns, MD;  Location: [facility] CATH LAB;  Service: Cardiovascular;  Laterality: N/A;  . Abdominal aortagram N/A 10/05/2013    Procedure: ABDOMINAL AORTAGRAM;  Surgeon: Nada Libman, MD;  Location: [facility] CATH LAB;  Service: Cardiovascular;  Laterality: N/A;  . Lower extremity angiogram Left 10/05/2013    Procedure: LOWER EXTREMITY ANGIOGRAM;  Surgeon: Nada Libman, MD;  Location: [facility] CATH LAB;  Service: Cardiovascular;  Laterality: Left;  . Abdominal aortagram N/A 11/01/2014    Procedure: ABDOMINAL Ronny Flurry;  Surgeon: Nada Libman, MD;  Location: [facility] CATH LAB;  Service: Cardiovascular;  Laterality: N/A;  . Joint replacement Right 1999    Knee  . Toe amputation Left 09/05/2014    Removed left fifth toe  . Cataract extraction w/phaco Left 12/22/2014    Procedure: CATARACT EXTRACTION PHACO AND INTRAOCULAR LENS PLACEMENT (IOC);  Surgeon: Gemma Payor, MD;  Location: [facility] ORS;  Service: Ophthalmology;  Laterality: Left;  CDE:11.89  . Cataract extraction w/phaco Right 01/30/2015    Procedure: CATARACT EXTRACTION PHACO AND INTRAOCULAR LENS PLACEMENT RIGHT EYE;  Surgeon: Gemma Payor, MD;  Location: [facility] ORS;  Service: Ophthalmology;  Laterality: Right;  CDE:10.56  . Eye surgery Left December 22, 2014    Cataract  . Eye surgery Right December 31, 2014    Cataract   Family History  Problem Relation Age of Onset  . Stomach cancer Mother 68  . Cancer Mother     Stomach  . Heart disease Mother   . Heart attack Mother   . Heart attack Father   . Heart disease Father   . Heart disease Brother   . Leukemia Brother   . Lung disease Son     infant  . Lung disease Daughter     infant  . Colon cancer Neg Hx    Social History  Substance Use Topics  . Smoking status: Former Smoker -- 0.50 packs/day for 5 years    Types: Cigarettes    Start date: 09/23/1956    Quit date: 09/23/1965    . Smokeless tobacco: Former Neurosurgeon  . Alcohol Use: No    Review of Systems  Constitutional: Negative for fever, chills, diaphoresis, appetite change and fatigue.  HENT: Negative for mouth sores, sore throat and trouble swallowing.   Eyes: Negative for visual disturbance.  Respiratory: Positive for shortness of breath. Negative for cough, chest tightness and wheezing.   Cardiovascular: Positive for chest pain.  Gastrointestinal: Positive for nausea. Negative for vomiting, abdominal pain, diarrhea and abdominal distention.  Endocrine: Negative for polydipsia, polyphagia and polyuria.  Genitourinary: Negative for dysuria, frequency and hematuria.  Musculoskeletal: Positive for myalgias and arthralgias. Negative for gait problem.  Chronic arthritis pain  Skin: Negative for color change, pallor and rash.  Neurological: Negative for dizziness, syncope, light-headedness and headaches.  Hematological: Does not bruise/bleed easily.  Psychiatric/Behavioral: Negative for behavioral problems and confusion.      Allergies  Clindamycin/lincomycin; Amoxicillin; Ciprofloxacin; Other; Ceftin; Doxycycline; and Levofloxacin  Home Medications   Prior to Admission medications   Medication Sig Start Date End Date Taking? Authorizing Provider  ALPRAZolam (XANAX) 1 MG tablet TAKE 1 TABLET BY MOUTH ONCE DAILY AS NEEDED FOR ANXIETY. Patient taking differently: TAKE 1 TABLET BY MOUTH ONCE DAILY AT BEDTIME 05/11/15   Merlyn Albert, MD  atenolol (TENORMIN) 25 MG tablet TAKE 1 TABLET BY MOUTH TWICE DAILY 11/13/15   Babs Sciara, MD  atorvastatin (LIPITOR) 80 MG tablet TAKE ONE TABLET BY MOUTH ONCE DAILY. 08/14/15   Antoine Poche, MD  diphenoxylate-atropine (LOMOTIL) 2.5-0.025 MG tablet Take 1 tablet by mouth 3 (three) times daily as needed for diarrhea or loose stools. 10/25/15   Babs Sciara, MD  finasteride (PROSCAR) 5 MG tablet Take 5 mg by mouth daily.    Historical Provider, MD  folic acid  (FOLVITE) 1 MG tablet Take 1 mg by mouth daily.    Historical Provider, MD  furosemide (LASIX) 20 MG tablet Take 20 mg by mouth daily as needed for fluid.    Historical Provider, MD  hydrALAZINE (APRESOLINE) 25 MG tablet Take 1.5 tablets (37.5 mg total) by mouth every 8 (eight) hours. 09/07/15   Henderson Cloud, MD  HYDROcodone-acetaminophen (NORCO) 10-325 MG tablet Take 1 tablet by mouth 4 (four) times daily. *May take one additional tablet daily as needed for pain    Historical Provider, MD  isosorbide mononitrate (IMDUR) 30 MG 24 hr tablet Take 1 tablet (30 mg total) by mouth daily. 09/07/15   Henderson Cloud, MD  levothyroxine (SYNTHROID, LEVOTHROID) 88 MCG tablet TAKE ONE TABLET BY MOUTH ONCE DAILY. 09/14/15   Merlyn Albert, MD  losartan (COZAAR) 25 MG tablet Take 1 tablet (25 mg total) by mouth daily. 07/05/15   Antoine Poche, MD  Melatonin 3 MG TABS Take 3-6 mg by mouth at bedtime as needed.     Historical Provider, MD  Multiple Vitamin (MULTIVITAMIN) capsule Take 1 capsule by mouth daily.    Historical Provider, MD  pantoprazole (PROTONIX) 40 MG tablet Take 1 tablet (40 mg total) by mouth 2 (two) times daily before a meal. 09/07/15   Estela Isaiah Blakes, MD  predniSONE (DELTASONE) 10 MG tablet TAKE ONE TABLET BY MOUTH ONCE DAILY. 04/04/15   Merlyn Albert, MD  Probiotic Product (FLORAJEN3 PO) Take 1 capsule by mouth at bedtime.    Historical Provider, MD  tamsulosin (FLOMAX) 0.4 MG CAPS capsule Take 0.4 mg by mouth every evening.     Historical Provider, MD  Tofacitinib Citrate (XELJANZ) 5 MG TABS Take by mouth once.    Historical Provider, MD  Vitamin D, Ergocalciferol, (DRISDOL) 50000 UNITS CAPS capsule Take 50,000 Units by mouth every 7 (seven) days. On Monday.    Historical Provider, MD   BP 101/84 mmHg  Pulse 73  Temp(Src) 98.4 F (36.9 C) (Oral)  Resp 16  Wt 137 lb (62.143 kg)  SpO2 99% Physical Exam  Constitutional: He is oriented to person,  place, and time. He appears well-developed and well-nourished. No distress.  Somewhat pale appearing. Awake and alert. Conjunctiva shows slightly pale  HENT:  Head: Normocephalic.  Eyes: Conjunctivae are normal. Pupils are  equal, round, and reactive to light. No scleral icterus.  Neck: Normal range of motion. Neck supple. No thyromegaly present.  Cardiovascular: Normal rate and regular rhythm.  Exam reveals no gallop and no friction rub.   No murmur heard. Pulmonary/Chest: Effort normal and breath sounds normal. No respiratory distress. He has no decreased breath sounds. He has no wheezes. He has no rales.    Clear bilateral breath sounds. Some doses to the left anterior lateral chest wall no crepitus  Abdominal: Soft. Bowel sounds are normal. He exhibits no distension. There is no tenderness. There is no rebound.  Musculoskeletal: Normal range of motion.  Neurological: He is alert and oriented to person, place, and time.  Skin: Skin is warm and dry. No rash noted.  Psychiatric: He has a normal mood and affect. His behavior is normal.    ED Course  Procedures (including critical care time) Labs Review Labs Reviewed  I-STAT CHEM 8, ED - Abnormal; Notable for the following:    Chloride 112 (*)    BUN 31 (*)    Creatinine, Ser 1.70 (*)    Glucose, Bld 137 (*)    Calcium, Ion 0.94 (*)    Hemoglobin 11.6 (*)    HCT 34.0 (*)    All other components within normal limits  BASIC METABOLIC PANEL  CBC  I-STAT TROPOININ, ED    Imaging Review No results found. I have personally reviewed and evaluated these images and lab results as part of my medical decision-making.   EKG Interpretation   Date/Time:  Tuesday 11-21-2015 18:43:31 EST Ventricular Rate:  79 PR Interval:  170 QRS Duration: 72 QT Interval:  404 QTC Calculation: 463 R Axis:   -29 Text Interpretation:  ** ** ACUTE MI / STEMI ** ** Sinus rhythm with  Premature atrial complexes Left ventricular hypertrophy  Inferior STE with  depression in I, aVL Confirmed by KOHUT  MD, STEPHEN ([id number]) on 21-Nov-2015  6:54:01 PM      MDM   Final diagnoses:  ST elevation myocardial infarction involving right coronary artery (HCC)    CRITICAL CARE Performed by: Claudean Kinds   Total critical care time: 19 minutes  Critical care time was exclusive of separately billable procedures and treating other patients.  Critical care was necessary to treat or prevent imminent or life-threatening deterioration.  Critical care was time spent personally by me on the following activities: development of treatment plan with patient and/or surrogate as well as nursing, discussions with consultants, evaluation of patient's response to treatment, examination of patient, obtaining history from patient or surrogate, ordering and performing treatments and interventions, ordering and review of laboratory studies, ordering and review of radiographic studies, pulse oximetry and re-evaluation of patient's condition. care  I was notified of this patient by Dr. Juleen China my partner. He evaluated the patient triaged activated code STEMI. Patient given aspirin, and 60 units per kilogram heparin bolus. Patient has been called for by the Cath Lab. Remains stable here and is taken emergently to Cath Lab for PTCA.    Rolland Porter, MD Nov 21, 2015 1912  Rolland Porter, MD 11-21-15 [phone]

## 2015-11-14 NOTE — Progress Notes (Signed)
ANTICOAGULATION CONSULT NOTE - Initial Consult  Pharmacy Consult for Heparin Indication: post-STEMI with Impella  Allergies  Allergen Reactions  . Clindamycin/Lincomycin Other (See Comments)    Kidney Failure  . Amoxicillin Hives and Other (See Comments)    Hallucinations   . Ciprofloxacin Other (See Comments)    C-diff  . Other Hives, Diarrhea and Other (See Comments)    "Mycin" Causes C-diff  . Ceftin [Cefuroxime Axetil]     Taste like rusty iron, nausea,  . Doxycycline Nausea And Vomiting  . Levofloxacin Nausea And Vomiting    Patient Measurements: Height: 5\' 6"  (167.6 cm) Weight: 151 lb 7.3 oz (68.7 kg) IBW/kg (Calculated) : 63.8 Heparin Dosing Weight: 62.1 kg   Vital Signs: Temp: 98.4 F (36.9 C) 12/07/2022 1844) Temp Source: Oral 2022/12/07 1844) BP: 148/86 mmHg 07-Dec-2022 2054) Pulse Rate: 98 Dec 07, 2022 2059)  Labs:  Recent Labs  12-08-15 1906  HGB 10.1*  11.6*  HCT 32.9*  34.0*  PLT 143*  CREATININE 1.80*  1.70*    Estimated Creatinine Clearance: 34.5 mL/min (by C-G formula based on Cr of 1.8).   Medical History: Past Medical History  Diagnosis Date  . 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 Hyperlipidemia   . Hypertension   . PVD (peripheral vascular disease) (HCC)     Left iliac PCI/stent  . Cerebrovascular disease   . Tobacco abuse, in remission     Remote  . GERD (gastroesophageal reflux disease)   . Hypothyroid   . Allergic rhinitis   . Chronic lung disease     ?  Rheumatoid lung; ?  Adverse reaction to methotrexate  . Schatzki's ring   . Hx of Clostridium difficile infection     Recurrent; associated 40 pound weight loss  . Achalasia   . Diabetes mellitus     borderline no meds  . Atrial fibrillation (HCC)   . Chronic back pain   . 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 Cervical spondylosis   . DDD (degenerative disc disease), lumbar   . Pneumonia   . Carotid artery occlusion   . Nephrolithiasis 2010     s/p lithotripsy  . Hiatal hernia     Medications:  F/u med rec  Assessment: 70 y/o M with CP/STEMI presents for emergent cath. Patient has very complex PMH.   - Patient has some bruising on his left anterior lateral chest wall. Daughter states that he had a spontaneous "muscle hemorrhage" and was taken off of aspirin and Plavix a month ago.  Dec 07, 2022: Cath>>coded in cath lab>Impella  2022-12-07 PM: Post STEMI orders:  - Bival in cath lab 1.75 mg/kg/hr then - Plavix 600mg  ordered via OG when inserted - then run Bival x 2 more hrs>> - then off x 2 hrs>> - then Start systemic IV heparin  Anticoagulation: Total heparin systemically + purge solution should start at 8-10 units/kg/hr (500-620 units/hr for 62 kg patient) when ACT<160. Initial purge solution containing 50 units/ml of heparin to run at 350 units/hr  Goal of Therapy:  ACT 160-180 Monitor platelets by anticoagulation protocol: Yes   Plan:  -When time to start IV heparin (when ACT<160 and when Bival has been off x 2 hrs) start at 150 units/hr. - Check ACT every 1 hr initially. - If the response becomes stable, the ACT checks can be reduced to every 4 hours while the patient is receiving both sources of heparin.  Jaila Schellhorn S. 3/21, PharmD, BCPS Clinical Staff  Pharmacist Pager [phone]  Misty Stanley Stillinger 11/04/2015,10:03 PM

## 2015-11-14 NOTE — Telephone Encounter (Signed)
Patient states his BP has been elevated and he has had chest pain off and on / tg

## 2015-11-14 NOTE — Progress Notes (Signed)
Orthopedic Tech Progress Note Patient Details:  [name] [birth year] [id number]  Ortho Devices Type of Ortho Device: Knee Immobilizer Ortho Device/Splint Location: rle Ortho Device/Splint Interventions: Ordered, Application   [name] 11-30-2015, 9:43 PM

## 2015-11-14 NOTE — ED Notes (Signed)
Pt reports chest pain that started about a week ago. Pt was at PCP office today and told to come here for further evaluation. Cardiologist is in [place].

## 2015-11-14 NOTE — Consult Note (Signed)
PULMONARY / CRITICAL CARE MEDICINE   Name: [name] MRN: [id number] DOB: 1946-08-26    ADMISSION DATE:  Nov 29, 2015 CONSULTATION DATE:  11-29-15  REFERRING MD:  Dr. Excell Seltzer  CHIEF COMPLAINT:  arrest  HISTORY OF PRESENT ILLNESS:   70 year old male with PMH as below, which includes CAD s/p remote CABG, HTN, CVA, pulmonary fibrosis secondary to chronic methotrexate and RA (MR patient), Afib, and hypothyroidism. He presented to [facility] 2/21 with complaints of intermittent chest pain for 5 days described as "someone standing on chest". He has recently stopped asa and plavix in January due to spontaneous flank hematoma. Associated SOB. 2/21 pain became more severe. EKG indicated posterior STEMI and he was taken emergently for cardiac cath where reportedly a graft was found to be occluded, however, official cath report not available at this time. Patient suffered PEA cardiac arrest while in cath lab. Underwent 25 mins of ACLS prior to ROSC. Intubated during code. Impella placed. Post arrest patient was purposeful and following commands. Requiring high dose vasopressors. He was taken to ICU for recovery. PCCM to see.   PAST MEDICAL HISTORY :  He  has a past medical history of Arteriosclerotic cardiovascular disease (ASCVD); Hyperlipidemia; Hypertension; PVD (peripheral vascular disease) (HCC); Cerebrovascular disease; Tobacco abuse, in remission; GERD (gastroesophageal reflux disease); Hypothyroid; Allergic rhinitis; Chronic lung disease; Schatzki's ring; Clostridium difficile infection; Achalasia; Diabetes mellitus; Atrial fibrillation (HCC); Chronic back pain; Rheumatoid arthritis(714.0); Cervical spondylosis; DDD (degenerative disc disease), lumbar; Pneumonia; Carotid artery occlusion; Nephrolithiasis (2010); and Hiatal hernia.  PAST SURGICAL HISTORY: He  has past surgical history that includes Coronary artery bypass graft; Total knee arthroplasty; Ankle Fusion; Wrist fusion; Shoulder  surgery; Colonoscopy (09/12/2011); Esophagogastroduodenoscopy (09/13/2011); Savory dilation (09/13/2011); Esophageal manometry (09/30/2011); Esophagogastroduodenoscopy (09/12/2011); Cardiac surgery; Esophagomyotomy (11/12/11); Esophagogastroduodenoscopy (06/25/2012); Savory dilation (06/25/2012); maloney dilation (06/25/2012); Ankle fusion (09/01/2012); Femoral-tibial Bypass Graft (Left, 01/28/2013); Amputation (Left, 01/28/2013); Spine surgery (11-03-13); abdominal aortagram (N/A, 01/01/2013); abdominal aortagram (N/A, 10/05/2013); lower extremity angiogram (Left, 10/05/2013); abdominal aortagram (N/A, 11/01/2014); Joint replacement (Right, 1999); Toe amputation (Left, 09/05/2014); Cataract extraction w/PHACO (Left, 12/22/2014); Cataract extraction w/PHACO (Right, 01/30/2015); Eye surgery (Left, December 22, 2014); and Eye surgery (Right, December 31, 2014).  Allergies  Allergen Reactions  . Clindamycin/Lincomycin Other (See Comments)    Kidney Failure  . Amoxicillin Hives and Other (See Comments)    Hallucinations   . Ciprofloxacin Other (See Comments)    C-diff  . Other Hives, Diarrhea and Other (See Comments)    "Mycin" Causes C-diff  . Ceftin [Cefuroxime Axetil]     Taste like rusty iron, nausea,  . Doxycycline Nausea And Vomiting  . Levofloxacin Nausea And Vomiting    No current facility-administered medications on file prior to encounter.   Current Outpatient Prescriptions on File Prior to Encounter  Medication Sig  . ALPRAZolam (XANAX) 1 MG tablet TAKE 1 TABLET BY MOUTH ONCE DAILY AS NEEDED FOR ANXIETY. (Patient taking differently: TAKE 1 TABLET BY MOUTH ONCE DAILY AT BEDTIME)  . atenolol (TENORMIN) 25 MG tablet TAKE 1 TABLET BY MOUTH TWICE DAILY  . atorvastatin (LIPITOR) 80 MG tablet TAKE ONE TABLET BY MOUTH ONCE DAILY.  . diphenoxylate-atropine (LOMOTIL) 2.5-0.025 MG tablet Take 1 tablet by mouth 3 (three) times daily as needed for diarrhea or loose stools.  . finasteride (PROSCAR) 5 MG tablet Take 5 mg by  mouth daily.  . folic acid (FOLVITE) 1 MG tablet Take 1 mg by mouth daily.  . furosemide (LASIX) 20 MG tablet Take 20 mg  by mouth daily as needed for fluid.  . hydrALAZINE (APRESOLINE) 25 MG tablet Take 1.5 tablets (37.5 mg total) by mouth every 8 (eight) hours.  Marland Kitchen HYDROcodone-acetaminophen (NORCO) 10-325 MG tablet Take 1 tablet by mouth 4 (four) times daily. *May take one additional tablet daily as needed for pain  . isosorbide mononitrate (IMDUR) 30 MG 24 hr tablet Take 1 tablet (30 mg total) by mouth daily.  Marland Kitchen levothyroxine (SYNTHROID, LEVOTHROID) 88 MCG tablet TAKE ONE TABLET BY MOUTH ONCE DAILY.  Marland Kitchen losartan (COZAAR) 25 MG tablet Take 1 tablet (25 mg total) by mouth daily.  . Melatonin 3 MG TABS Take 3-6 mg by mouth at bedtime as needed.   . Multiple Vitamin (MULTIVITAMIN) capsule Take 1 capsule by mouth daily.  . pantoprazole (PROTONIX) 40 MG tablet Take 1 tablet (40 mg total) by mouth 2 (two) times daily before a meal.  . predniSONE (DELTASONE) 10 MG tablet TAKE ONE TABLET BY MOUTH ONCE DAILY.  . Probiotic Product (FLORAJEN3 PO) Take 1 capsule by mouth at bedtime.  . tamsulosin (FLOMAX) 0.4 MG CAPS capsule Take 0.4 mg by mouth every evening.   . Tofacitinib Citrate (XELJANZ) 5 MG TABS Take by mouth once.  . Vitamin D, Ergocalciferol, (DRISDOL) 50000 UNITS CAPS capsule Take 50,000 Units by mouth every 7 (seven) days. On Monday.    FAMILY HISTORY:  His indicated that his mother is deceased. He indicated that his father is deceased. He indicated that only one of his two sisters is alive. He indicated that his brother is deceased. He indicated that his daughter is deceased. He indicated that his son is deceased.   SOCIAL HISTORY: He  reports that he quit smoking about 50 years ago. His smoking use included Cigarettes. He started smoking about 59 years ago. He has a 2.5 pack-year smoking history. He has quit using smokeless tobacco. He reports that he does not drink alcohol or use illicit  drugs.  REVIEW OF SYSTEMS:  Unable to assess due to intubation & sedation.  SUBJECTIVE:    VITAL SIGNS: BP 148/86 mmHg  Pulse 98  Temp(Src) 98.4 F (36.9 C) (Oral)  Resp 21  Ht 5\' 6"  (1.676 m)  Wt 62.143 kg (137 lb)  BMI 22.12 kg/m2  SpO2 0%  HEMODYNAMICS:    VENTILATOR SETTINGS: Vent Mode:  [-] PRVC FiO2 (%):  [60 %] 60 % Set Rate:  [28 bmp] 28 bmp Vt Set:  [500 mL] 500 mL PEEP:  [5 cmH20] 5 cmH20 Plateau Pressure:  [21 cmH20] 21 cmH20  INTAKE / OUTPUT:    PHYSICAL EXAMINATION: General:  Male of normal body habitus on vent Neuro:  Sedated, when arousable able to follow simple commands and pulling at ETT.  HEENT:  [place]/AT, PERRL, no apprecaible JVD Cardiovascular:  IRIR rate controlled Lungs:  Clear bilateral breath sounds Abdomen:  Soft, non-tender, non-distended Musculoskeletal:  No acute deformity Skin:  Skin tear to left forearm   LABS:  BMET  Recent Labs Lab 11/16/2015 1906  NA 144  144  K 4.3  4.1  CL 111  112*  CO2 19*  BUN 29*  31*  CREATININE 1.80*  1.70*  GLUCOSE 142*  137*    Electrolytes  Recent Labs Lab 10/30/2015 1906  CALCIUM 9.4    CBC  Recent Labs Lab 11/09/2015 1906  WBC 10.7*  HGB 10.1*  11.6*  HCT 32.9*  34.0*  PLT 143*    Coag's No results for input(s): APTT, INR in the last 168 hours.  Sepsis Markers No results for input(s): LATICACIDVEN, PROCALCITON, O2SATVEN in the last 168 hours.  ABG No results for input(s): PHART, PCO2ART, PO2ART in the last 168 hours.  Liver Enzymes No results for input(s): AST, ALT, ALKPHOS, BILITOT, ALBUMIN in the last 168 hours.  Cardiac Enzymes No results for input(s): TROPONINI, PROBNP in the last 168 hours.  Glucose No results for input(s): GLUCAP in the last 168 hours.  Imaging Portable Chest Xray  11/19/2015  CLINICAL DATA:  Endotracheal tube placement.  Initial encounter. EXAM: PORTABLE CHEST 1 VIEW COMPARISON:  Chest radiograph performed 10/19/2015 FINDINGS: The  patient's endotracheal tube is noted ending overlying the right mainstem bronchus, 2 cm below the carina. This should be retracted 5-6 cm. The balloon also appears slightly overinflated. The lungs are hypoexpanded. Bibasilar opacities may reflect atelectasis or possibly mild infection. No definite pleural effusion or pneumothorax is seen. The cardiomediastinal silhouette is borderline enlarged. A catheter is noted overlying the heart. The patient is status post median sternotomy. There has been interval healing of the right lower lateral rib fracture. No acute osseous abnormalities are identified. IMPRESSION: 1. Endotracheal tube seen ending overlying the right mainstem bronchus, 2 cm below the carina. This should be retracted 5-6 cm. The balloon also appears slightly overinflated. This could be deflated slightly. 2. Lungs hypoexpanded. Bibasilar airspace opacities may reflect atelectasis or possibly mild infection. 3. Borderline cardiomegaly. These results were called by telephone at the time of interpretation on 10/30/2015 at 9:37 pm to [facility] RN on [facility], who verbally acknowledged these results. Electronically Signed   By: Roanna Raider M.D.   On: 11/16/2015 21:42    STUDIES:  2/21 LHC > Critical stenosis of the SVG sequential to OM1, OM2, with significant PCI via DES. Known left main, RCA, SVG-RCA occlusions. Port CXR 2/21:  Reviewed by Dr. Jamison Neighbor. ETT R mainstem. No focal opacity.  CULTURES: Blood 2/21 >>>  ANTIBIOTICS: None.  SIGNIFICANT EVENTS: 2/21 admit for STEMI, to cath lab for PCI, coded 25 mins PEA, intubated. On pressors, Impella placed.   LINES/TUBES: Impella 2/21 >>> ETT 8.0 2/21 >>> PIV x3  DISCUSSION: 70 year old male with PMH of CAD s/p remote CABG and pulmonary fibrosis presented to [facility] with chest pain, found to be STEMI and transferred to [facility] for LHC. Stenting to SVG with DES complicated by PEA arrest 25 mins. Post arrest was waking up and following some commands.  Impella in place.   ASSESSMENT / PLAN:  PULMONARY A: VDRF in setting cardiac arrest Pulmonary fibrosis secondary to Rheumatoid lung vs methotrexate reaction  P:   Full vent support Repeat ABG CXR for ETT placement Daily SBT  CARDIOVASCULAR A:  Cardiac arrest - 25 mins PEA Cardiogenic shock - Impella in place. STEMI - s/p PCI Atrail fibrillation with controlled ventricular response. CAD s/p CABG  P:  Telemetry monitoring MAP goal > Norepinephrine, dopamine infusions for MAP goal Impella in place STAT TTE Insert CVL CVP monitoring Place art line Trend troponin Cardiology primary Holding home antihypertensives (atenolol, furosemide, hydralazine, isosorbide, losartan) Not a candidate for therapeutic hypothermia based on good LOC post-arrest  RENAL A:   Acute on Chronic Renal Failure Metabolic acidosis  P:   Repeat and trend BMP for renal function Correct electrolytes as indicated Placing foley to monitor UOP  GASTROINTESTINAL A:   GERD  P:   Protonix IV daily NPO  HEMATOLOGIC A:   Anemia - mild Thrombocytopenia - mild  P:  Anticoagulation/antiplatelete per cardiology Follow CBC  INFECTIOUS A:   No acute issues  P:   Monitor off ABX  ENDOCRINE A:   DM Hypothyroid Chronic Prednisone Use - For RA.    P:   TSH CBG monitoring q4 hours and SSI Continue home synthroid with IV dosing Solu-Cortef 50mg  IV q12hr  AUTOIMMUNE A:   Rheumatoid arthritis - Chronic Prednisone use.    P:   Stress dose steroids - 50 IV q12hr Hold home xeljanz   NEUROLOGIC A:   Acute hypoxemic encephalopathy - Improving. Reportedly squeezing hand post code.  P:   RASS goal: -1 Propofol infusion for sedation PRN fentanyl for analgesia Daily WUA   FAMILY  - Updates: Family updated by Cardiology  - Inter-disciplinary family meet or Palliative Care meeting due by:  2/28  APP CC time 40 mins  3/28, AGACNP-BC [place] [facility]  [facility] Pager [phone] or [phone]  11/11/2015 10:17 PM   PCCM Attending Note: Patient seen & examined with nurse practitioner. Please refer to his consult note which I have reviewed in detail. 70 year old male status post cardiac arrest. Revascularized with Impella device in place. Continuing to wean vasopressor support. Cautiously continuing stress dosed steroids given potential for free wall rupture. Patient's neurological status reportedly in tact postarrest therefore no indication for therapeutic hypothermia protocol. Holding on antibiotics at this time while monitoring closely. Continuing to trend renal function given acute renal failure with chronic renal disease.  I have spent a total of 31 minutes of critical care time today caring for the patient and reviewing the patient's electronic medical record.  66 Donna Christen, M.D. [facility] Pager:  [phone] After 3pm or if no response, call [phone]

## 2015-11-14 NOTE — Progress Notes (Signed)
Echocardiogram 2D Echocardiogram limited has been performed.  [name] 12-06-2015, 11:11 PM

## 2015-11-14 NOTE — Telephone Encounter (Signed)
I agree, he has a very complex medical history and is best evaluated in ER  Dominga Ferry MD

## 2015-11-15 ENCOUNTER — Ambulatory Visit: Payer: Medicare Other | Admitting: Family Medicine

## 2015-11-15 ENCOUNTER — Inpatient Hospital Stay (HOSPITAL_COMMUNITY): Payer: Medicare Other

## 2015-11-15 ENCOUNTER — Encounter (HOSPITAL_COMMUNITY): Payer: Self-pay | Admitting: Cardiovascular Disease

## 2015-11-15 ENCOUNTER — Ambulatory Visit (HOSPITAL_COMMUNITY): Payer: Medicare Other

## 2015-11-15 DIAGNOSIS — J9601 Acute respiratory failure with hypoxia: Secondary | ICD-10-CM

## 2015-11-15 DIAGNOSIS — Z978 Presence of other specified devices: Secondary | ICD-10-CM | POA: Insufficient documentation

## 2015-11-15 DIAGNOSIS — K759 Inflammatory liver disease, unspecified: Secondary | ICD-10-CM

## 2015-11-15 DIAGNOSIS — I509 Heart failure, unspecified: Secondary | ICD-10-CM

## 2015-11-15 DIAGNOSIS — R579 Shock, unspecified: Secondary | ICD-10-CM

## 2015-11-15 LAB — CARBOXYHEMOGLOBIN
CARBOXYHEMOGLOBIN: 0.9 % (ref 0.5–1.5)
Carboxyhemoglobin: 1 % (ref 0.5–1.5)
Carboxyhemoglobin: 1 % (ref 0.5–1.5)
METHEMOGLOBIN: 1.1 % (ref 0.0–1.5)
Methemoglobin: 1.2 % (ref 0.0–1.5)
Methemoglobin: 0.9 % (ref 0.0–1.5)
O2 SAT: 68.3 %
O2 Saturation: 49.3 %
O2 Saturation: 61.7 %
TOTAL HEMOGLOBIN: 10.8 g/dL — AB (ref 13.5–18.0)
TOTAL HEMOGLOBIN: 10.8 g/dL — AB (ref 13.5–18.0)
Total hemoglobin: 10.8 g/dL — ABNORMAL LOW (ref 13.5–18.0)

## 2015-11-15 LAB — POCT I-STAT 3, ART BLOOD GAS (G3+)
ACID-BASE DEFICIT: 14 mmol/L — AB (ref 0.0–2.0)
ACID-BASE DEFICIT: 15 mmol/L — AB (ref 0.0–2.0)
ACID-BASE DEFICIT: 6 mmol/L — AB (ref 0.0–2.0)
BICARBONATE: 18.3 meq/L — AB (ref 20.0–24.0)
BICARBONATE: 9.7 meq/L — AB (ref 20.0–24.0)
Bicarbonate: 13.4 mEq/L — ABNORMAL LOW (ref 20.0–24.0)
O2 SAT: 100 %
O2 SAT: 100 %
O2 SAT: 99 %
PH ART: 7.359 (ref 7.350–7.450)
PH ART: 7.382 (ref 7.350–7.450)
PO2 ART: 139 mmHg — AB (ref 80.0–100.0)
PO2 ART: 463 mmHg — AB (ref 80.0–100.0)
PO2 ART: 518 mmHg — AB (ref 80.0–100.0)
Patient temperature: 98.6
Patient temperature: 98.6
TCO2: 10 mmol/L (ref 0–100)
TCO2: 15 mmol/L (ref 0–100)
TCO2: 19 mmol/L (ref 0–100)
pCO2 arterial: 17.3 mmHg — CL (ref 35.0–45.0)
pCO2 arterial: 30.9 mmHg — ABNORMAL LOW (ref 35.0–45.0)
pCO2 arterial: 39.7 mmHg (ref 35.0–45.0)
pH, Arterial: 7.138 — CL (ref 7.350–7.450)

## 2015-11-15 LAB — GLUCOSE, CAPILLARY
GLUCOSE-CAPILLARY: 118 mg/dL — AB (ref 65–99)
Glucose-Capillary: 187 mg/dL — ABNORMAL HIGH (ref 65–99)
Glucose-Capillary: 152 mg/dL — ABNORMAL HIGH (ref 65–99)
Glucose-Capillary: 155 mg/dL — ABNORMAL HIGH (ref 65–99)
Glucose-Capillary: 146 mg/dL — ABNORMAL HIGH (ref 65–99)
Glucose-Capillary: 106 mg/dL — ABNORMAL HIGH (ref 65–99)
Glucose-Capillary: 111 mg/dL — ABNORMAL HIGH (ref 65–99)

## 2015-11-15 LAB — CBC WITH DIFFERENTIAL/PLATELET
BASOS PCT: 0 %
Basophils Absolute: 0 10*3/uL (ref 0.0–0.1)
Eosinophils Absolute: 0.1 10*3/uL (ref 0.0–0.7)
Eosinophils Relative: 0 %
HEMATOCRIT: 34.2 % — AB (ref 39.0–52.0)
HEMOGLOBIN: 10.6 g/dL — AB (ref 13.0–17.0)
Lymphocytes Relative: 7 %
Lymphs Abs: 1.3 10*3/uL (ref 0.7–4.0)
MCH: 29.3 pg (ref 26.0–34.0)
MCHC: 31 g/dL (ref 30.0–36.0)
MCV: 94.5 fL (ref 78.0–100.0)
MONOS PCT: 8 %
Monocytes Absolute: 1.5 10*3/uL — ABNORMAL HIGH (ref 0.1–1.0)
NEUTROS ABS: 16.7 10*3/uL — AB (ref 1.7–7.7)
NEUTROS PCT: 85 %
Platelets: 251 10*3/uL (ref 150–400)
RBC: 3.62 MIL/uL — AB (ref 4.22–5.81)
RDW: 16.5 % — ABNORMAL HIGH (ref 11.5–15.5)
WBC: 19.6 10*3/uL — ABNORMAL HIGH (ref 4.0–10.5)

## 2015-11-15 LAB — BLOOD GAS, ARTERIAL
ACID-BASE DEFICIT: 8 mmol/L — AB (ref 0.0–2.0)
Bicarbonate: 15.5 mEq/L — ABNORMAL LOW (ref 20.0–24.0)
Drawn by: 449561
FIO2: 0.5
O2 Saturation: 99.2 %
PATIENT TEMPERATURE: 97.8
PCO2 ART: 23.2 mmHg — AB (ref 35.0–45.0)
PEEP: 5 cmH2O
RATE: 28 resp/min
TCO2: 16.2 mmol/L (ref 0–100)
VT: 500 mL
pH, Arterial: 7.438 (ref 7.350–7.450)
pO2, Arterial: 212 mmHg — ABNORMAL HIGH (ref 80.0–100.0)

## 2015-11-15 LAB — BASIC METABOLIC PANEL
Anion gap: 12 (ref 5–15)
BUN: 31 mg/dL — AB (ref 6–20)
CALCIUM: 8 mg/dL — AB (ref 8.9–10.3)
CO2: 17 mmol/L — ABNORMAL LOW (ref 22–32)
CREATININE: 1.82 mg/dL — AB (ref 0.61–1.24)
Chloride: 116 mmol/L — ABNORMAL HIGH (ref 101–111)
GFR calc Af Amer: 42 mL/min — ABNORMAL LOW (ref >60)
GFR, EST NON AFRICAN AMERICAN: 36 mL/min — AB (ref >60)
Glucose, Bld: 179 mg/dL — ABNORMAL HIGH (ref 65–99)
POTASSIUM: 4.3 mmol/L (ref 3.5–5.1)
SODIUM: 145 mmol/L (ref 135–145)

## 2015-11-15 LAB — POCT ACTIVATED CLOTTING TIME
ACTIVATED CLOTTING TIME: 245 s
ACTIVATED CLOTTING TIME: 214 s
ACTIVATED CLOTTING TIME: 204 s
ACTIVATED CLOTTING TIME: 198 s
ACTIVATED CLOTTING TIME: 178 s
ACTIVATED CLOTTING TIME: 167 s
Activated Clotting Time: 461 seconds
Activated Clotting Time: 229 seconds
Activated Clotting Time: 219 seconds
Activated Clotting Time: 178 seconds
Activated Clotting Time: 167 seconds

## 2015-11-15 LAB — MAGNESIUM
MAGNESIUM: 2 mg/dL (ref 1.7–2.4)
Magnesium: 1.8 mg/dL (ref 1.7–2.4)

## 2015-11-15 LAB — TROPONIN I: TROPONIN I: 25.67 ng/mL — AB (ref <0.031)

## 2015-11-15 LAB — TRIGLYCERIDES: TRIGLYCERIDES: 138 mg/dL (ref <150)

## 2015-11-15 LAB — COMPREHENSIVE METABOLIC PANEL
ALK PHOS: 115 U/L (ref 38–126)
ALT: 152 U/L — ABNORMAL HIGH (ref 17–63)
ANION GAP: 10 (ref 5–15)
AST: 468 U/L — ABNORMAL HIGH (ref 15–41)
Albumin: 2.7 g/dL — ABNORMAL LOW (ref 3.5–5.0)
BILIRUBIN TOTAL: 1.4 mg/dL — AB (ref 0.3–1.2)
BUN: 32 mg/dL — AB (ref 6–20)
CALCIUM: 7.5 mg/dL — AB (ref 8.9–10.3)
CO2: 15 mmol/L — AB (ref 22–32)
Chloride: 116 mmol/L — ABNORMAL HIGH (ref 101–111)
Creatinine, Ser: 1.83 mg/dL — ABNORMAL HIGH (ref 0.61–1.24)
GFR calc Af Amer: 41 mL/min — ABNORMAL LOW (ref >60)
GFR, EST NON AFRICAN AMERICAN: 36 mL/min — AB (ref >60)
GLUCOSE: 181 mg/dL — AB (ref 65–99)
Potassium: 4 mmol/L (ref 3.5–5.1)
Sodium: 141 mmol/L (ref 135–145)
Total Protein: 5.1 g/dL — ABNORMAL LOW (ref 6.5–8.1)

## 2015-11-15 LAB — LACTIC ACID, PLASMA
Lactic Acid, Venous: 4.8 mmol/L (ref 0.5–2.0)
Lactic Acid, Venous: 2.7 mmol/L (ref 0.5–2.0)

## 2015-11-15 LAB — LACTATE DEHYDROGENASE: LDH: 1563 U/L — AB (ref 98–192)

## 2015-11-15 LAB — TSH: TSH: 0.398 u[IU]/mL (ref 0.350–4.500)

## 2015-11-15 LAB — MRSA PCR SCREENING: MRSA BY PCR: NEGATIVE

## 2015-11-15 LAB — PHOSPHORUS: Phosphorus: 4.5 mg/dL (ref 2.5–4.6)

## 2015-11-15 MED ORDER — FENTANYL CITRATE (PF) 100 MCG/2ML IJ SOLN
50.0000 ug | INTRAMUSCULAR | Status: DC | PRN
  Administered 2015-11-15: 100 ug via INTRAVENOUS
  Administered 2015-11-15: 200 ug via INTRAVENOUS
  Filled 2015-11-15: qty 4

## 2015-11-15 MED ORDER — CHLORHEXIDINE GLUCONATE 0.12% ORAL RINSE (MEDLINE KIT): 15.0000 mL | Freq: Two times a day (BID) | OROMUCOSAL | Status: DC

## 2015-11-15 MED ORDER — VITAL AF 1.2 CAL PO LIQD
1000.0000 mL | ORAL | Status: DC
  Administered 2015-11-15: 1000 mL

## 2015-11-15 MED ORDER — VITAL HIGH PROTEIN PO LIQD: 1000.0000 mL | ORAL | Status: DC

## 2015-11-15 MED ORDER — CLOPIDOGREL BISULFATE 75 MG PO TABS
75.0000 mg | ORAL_TABLET | Freq: Every day | ORAL | Status: DC
  Administered 2015-11-15 – 2015-11-16 (×2): 75 mg via ORAL
  Filled 2015-11-15 (×2): qty 1

## 2015-11-15 MED ORDER — FENTANYL CITRATE (PF) 100 MCG/2ML IJ SOLN
INTRAMUSCULAR | Status: AC
  Administered 2015-11-15: 100 ug via INTRAVENOUS
  Filled 2015-11-15: qty 2

## 2015-11-15 MED ORDER — MILRINONE IN DEXTROSE 20 MG/100ML IV SOLN
0.1250 ug/kg/min | INTRAVENOUS | Status: DC
  Administered 2015-11-15 – 2015-11-16 (×3): 0.125 ug/kg/min via INTRAVENOUS
  Filled 2015-11-15 (×2): qty 100

## 2015-11-15 MED ORDER — MAGNESIUM SULFATE 2 GM/50ML IV SOLN
2.0000 g | Freq: Once | INTRAVENOUS | Status: AC
  Administered 2015-11-15: 2 g via INTRAVENOUS
  Filled 2015-11-15: qty 50

## 2015-11-15 MED ORDER — ANTISEPTIC ORAL RINSE SOLUTION (CORINZ): 7.0000 mL | Freq: Four times a day (QID) | OROMUCOSAL | Status: DC

## 2015-11-15 MED ORDER — SODIUM CHLORIDE 0.9 % IV BOLUS (SEPSIS)
250.0000 mL | Freq: Once | INTRAVENOUS | Status: AC
  Administered 2015-11-15: 250 mL via INTRAVENOUS

## 2015-11-15 MED ORDER — FENTANYL BOLUS VIA INFUSION
25.0000 ug | INTRAVENOUS | Status: DC | PRN
  Administered 2015-11-15 – 2015-11-16 (×3): 25 ug via INTRAVENOUS
  Filled 2015-11-15: qty 25

## 2015-11-15 MED ORDER — SODIUM CHLORIDE 0.9 % IV SOLN
25.0000 ug/h | INTRAVENOUS | Status: DC
  Administered 2015-11-15: 25 ug/h via INTRAVENOUS
  Administered 2015-11-16: 150 ug/h via INTRAVENOUS
  Filled 2015-11-15 (×2): qty 50

## 2015-11-15 NOTE — Progress Notes (Addendum)
Subjective:  Presented with an acute inferoposterior STEMI with DES placed to SVG-OM1 and 2. He had a cardiac arrest during diagnostic angiography. ROSC was achieved after 25 minutes of CPR. An Impella was placed.   He is currently requiring Levophed and Dopamine to support his pressures. He is awake on the vent.   Objective:  Vital Signs in the last 24 hours: Temp:  [97.5 F (36.4 C)-99.8 F (37.7 C)] 99.8 F (37.7 C) (02/22 0800) Pulse Rate:  [26-275] 93 (02/22 0805) Resp:  [9-36] 30 (02/22 0805) BP: (62-175)/(11-149) 116/71 mmHg (02/22 0805) SpO2:  [0 %-100 %] 100 % (02/22 0805) Arterial Line BP: (109-142)/(102-124) 109/102 mmHg (02/22 0800) FiO2 (%):  [50 %-60 %] 50 % (02/22 0805) Weight:  [62.143 kg (137 lb)-68.7 kg (151 lb 7.3 oz)] 68.7 kg (151 lb 7.3 oz) (02/22 0423)  Intake/Output from previous day: 02/21 0701 - 02/22 0700 In: 1524.4 [I.V.:1360.5; NG/GT:60] Out: 190 [Urine:190]  Physical Exam: General: elderly man on vent, NAD, awake HEENT: [place]/AT, EOMI, ETT in place Neck: supple, no JVD appreciated  Lungs: CTA bilaterally, on vent  CV: RRR, hum from impella noted  Abd: BS+, soft, non-tender Ext: no evidence of hematoma in R groin, trace-1+ peripheral edema, legs externally rotated  Skin: warm/dry no rash   Lab Results:  Recent Labs  12-06-2015 1906 11/15/15 0457  WBC 10.7* 19.6*  HGB 10.1*  11.6* 10.6*  PLT 143* 251    Recent Labs  06-Dec-2015 2330 11/15/15 0457  NA 145 141  K 4.3 4.0  CL 116* 116*  CO2 17* 15*  GLUCOSE 179* 181*  BUN 31* 32*  CREATININE 1.82* 1.83*    Recent Labs  December 06, 2015 2330 11/15/15 0457  TROPONINI 25.67* >65.00*    Cardiac Studies: Left Heart Cath with Angiography 2015-12-06 1. Known total occlusion of the left main and RCA 2. Known total occlusion of the SVG-RCA 3. Continued patency of the LIMA to diagonal and LAD 4. Critical stenosis of the SVG sequential to OM1 and OM2 5. Intraprocedural cardiac arrest (during  diagnostic angiography) 6. Successful PCI of the SVG-OM1 and 2 (DES platform) 7. ROSC in the cath lab after 25 minutes of CPR with purposeful movement 8. Cardiogenic shock with successful percutaneous placement of an Impella CP     Tele: PVCs  Assessment/Plan:  Mr. Goodrich is a 70yo man with hx of CAD s/p CABG who presented with an acute inferoposterior STEMI after being off Plavix and ASA for 1 month due to a spontaneous left flank hematoma. He had a DES placed to the SVG-OM1 and 2. Unfortunately he had a cardiac arrest during cath and ROSC was achieved after 25 mins of CPR. An Impella device was placed due to cardiogenic shock.   Cardiogenic Shock: He had cardiac arrest during coronary angiography. ROSC achieved after 25 mins CPR. He is stable while on the Impella. Pressures being supported with Levophed and Dopamine. Co-ox 68%. Will titrate impella support down gradually as patient tolerates.  - Will try to get dopamine off today, decrease Levo as tolerated - Continue to monitor pressures - Check co-ox daily  - Heart failure team to pick up patient tomorrow   Acute STEMI in setting of discontinuation of antiplatelets, known CAD s/p CABG: He had been off of antiplatelet therapy for the last month due to a spontaneous left flank hematoma. He was found to have an inferoposterior STEMI with DES placed to the SVG OM1 and 2. Troponins peaked to >65. EKG  improved this morning.  - Continue ASA. He received Plavix 600 mg once post cath.  - Continue heparin gtt - f/u echo - Hold statin, beta blocker, and ARB. Can add back once off vent and no longer requiring Impella assistance.   Left Flank Hematoma: Occurred spontaneously about 1 month ago. He was taken off ASA and Plavix due to this event. He was also found to have anemia with Hgb 7.6 in Dec with no overt bleeding.  - Continue to monitor   Rheumatoid Arthritis on Chronic Steroids: He has advanced disease. He is on Tofacitinib and prednisone 10  mg daily at home. He will need increased steroids due to this acute stress.  - Continue Solu-cortef 50 mg Q12H  CKD Stage III: Cr 1.8 on admission, has remained stable. His BL Cr is around 1.2-1.3. Cr likely elevated due to decreased perfusion. UOP only 220 cc over last 15 hours.  - Continue to trend Cr - Monitor I&Os  Normocytic Anemia in setting of recent bleeding events: Hgb 10.1 on admission, has remained stable. He had a spontaneous left flank hematoma last month and drop in Hgb in Dec without any overt bleeding. No evidence of bleeding on exam.  - Continue to monitor   Chronic ILD: Secondary to rheumatoid arthritis vs methotrexate therapy. Follows with Dr. Marchelle Gearing who wanted to perform bronchoscopy on him to look for opportunistic infections given he is on immunosuppressive therapies for his RA, but patient declined.   HTN: BPs currently stable in 110s-130s. He is requiring Levophed and Dopamine currently. Working on titrating off dopamine.  - Hold home antihypertensives - Titrate down levophed as tolerated    Attending note to follow.   Rich Number, MD, MPH Internal Medicine Resident, PGY-II Pager: [phone] 11/15/2015, 8:42 AM   Patient send and examined with Dr. Beckie Salts and Dr. Rennis Golden. Cath films and echo reviewed personally. Impella device repositioned multiple times throughout the day under echo guidance. I agree with the above note.   Mr. Porter is a 70 y/o male with multiple chronic, severe medical problems including advanced RA, pulmonary fibrosis, CAD s/p CABG, CKD and recent spontaneous flank hematoma requiring discontinuation of Plavix. His functional status has been quite limited recently. Admitted overnight with STEMI c/b prolonged cardiac arrest with ~25 minutes CPR. He was saved with heroic PCI of SVG to his LCX system and Impella placement in the setting of aggressive, high-quality CPR. This am he was awake on the vent and following commands. He was on full support with  Impella and Levophed at 18. Echo shows severe biventricular dysfunction with EF 20%. This is complicated by progressive renal failure and shock liver. When I attempted to turn down his Impella from 8 to 5 his co-ox dropped to 40%. I spoke with his family at length and infromed them that his prognosis is very tenuous in the face of multi-system organ failure and that even if we were able to wean Impella over the next few days he still may not have meaningful recovery. Will continue full support today. Add milrinone. Will watch renal function closely. He ahs made it clear that he would not want prolonged support should his prognosis deteriorate further.   The patient is critically ill with multiple organ systems failure and requires high complexity decision making for assessment and support, frequent evaluation and titration of therapies, application of advanced monitoring technologies and extensive interpretation of multiple databases.   Critical Care Time devoted to patient care services described in this note is >  45 Minutes not including time repositioning device.   Zyiah Withington,MD 11:44 PM

## 2015-11-15 NOTE — Progress Notes (Signed)
Arterial line insertion attempted unsuccessfully.  Unable to re-attempt due to urgent echo procedure.  PCCM NP aware.

## 2015-11-15 NOTE — Consult Note (Signed)
PULMONARY / CRITICAL CARE MEDICINE   Name: [name] MRN: [id number] DOB: 18-Oct-1945    ADMISSION DATE:  10/29/2015 CONSULTATION DATE:  11/16/2015  REFERRING MD:  Dr. Excell Seltzer  CHIEF COMPLAINT:  arrest BRIEF' 70 year old male with PMH as below, which includes CAD s/p remote CABG, HTN, CVA, pulmonary fibrosis secondary to chronic methotrexate and RA (MR patient), Afib, and hypothyroidism. He presented to [facility] 2/21 with complaints of intermittent chest pain for 5 days described as "someone standing on chest". He has recently stopped asa and plavix in January due to spontaneous flank hematoma. Associated SOB. 2/21 pain became more severe. EKG indicated posterior STEMI and he was taken emergently for cardiac cath where reportedly a graft was found to be occluded, however, official cath report not available at this time. Patient suffered PEA cardiac arrest while in cath lab. Underwent 25 mins of ACLS prior to ROSC. Intubated during code. Impella placed. Post arrest patient was purposeful and following commands. Requiring high dose vasopressors. He was taken to ICU for recovery. PCCM to see.     STUDIES:  2/21 LHC > Critical stenosis of the SVG sequential to OM1, OM2, with significant PCI via DES. Known left main, RCA, SVG-RCA occlusions. Port CXR 2/21:  Reviewed by Dr. Jamison Neighbor. ETT R mainstem. No focal opacity.  CULTURES: Blood 2/21 >>>  ANTIBIOTICS: None.  SIGNIFICANT EVENTS: 2/21 admit for STEMI, to cath lab for PCI, coded 25 mins PEA, intubated. On pressors, Impella placed.   LINES/TUBES: Impella 2/21 >>> ETT 8.0 2/21 >>> PIV x3    SUBJECTIVE:  11/15/2015 -> Impella device +. On pressors. Following commands. C/o groin pain and needing fent prn. 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 Findings on CT fall 2016 (FVC 50%) suggestive of MAI v opportunistic infetion. He refused bronch and followup   VITAL SIGNS: BP 109/74 mmHg  Pulse 25   Temp(Src) 99.8 F (37.7 C) (Oral)  Resp 28  Ht 5\' 6"  (1.676 m)  Wt 68.7 kg (151 lb 7.3 oz)  BMI 24.46 kg/m2  SpO2 99%  HEMODYNAMICS: CVP:  [4 mmHg-15 mmHg] 4 mmHg  VENTILATOR SETTINGS: Vent Mode:  [-] PRVC FiO2 (%):  [50 %-60 %] 50 % Set Rate:  [28 bmp] 28 bmp Vt Set:  [500 mL] 500 mL PEEP:  [5 cmH20] 5 cmH20 Plateau Pressure:  [17 cmH20-21 cmH20] 17 cmH20  INTAKE / OUTPUT: I/O last 3 completed shifts: In: 1524.4 [I.V.:1360.5; Other:103.9; NG/GT:60] Out: 190 [Urine:190]  PHYSICAL EXAMINATION: General:  Male of normal body habitus on vent Neuro:  Sedated, when arousable able to follow simple commands/ c/o pain HEENT:  [place]/AT, PERRL, no apprecaible JVD Cardiovascular:  IRIR rate controlled Lungs:  Clear bilateral breath sounds Abdomen:  Soft, non-tender, non-distended Musculoskeletal:  No acute deformity Skin:  Skin tear to left forearm   LABS: PULMONARY  Recent Labs Lab 11/21/2015 1906 11/15/15 0029 11/15/15 0430 11/15/15 0837  PHART  --  7.382 7.438  --   PCO2ART  --  30.9* 23.2*  --   PO2ART  --  139.0* 212*  --   HCO3  --  18.3* 15.5*  --   TCO2 19 19 16.2  --   O2SAT  --  99.0 99.2 68.3    CBC  Recent Labs Lab 10/29/2015 1906 11/15/15 0457  HGB 10.1*  11.6* 10.6*  HCT 32.9*  34.0* 34.2*  WBC 10.7* 19.6*  PLT 143* 251    COAGULATION  No results for input(s): INR in the last 168 hours.  CARDIAC   Recent Labs Lab 11/06/2015 2330 11/15/15 0457  TROPONINI 25.67* >65.00*   No results for input(s): PROBNP in the last 168 hours.   CHEMISTRY  Recent Labs Lab 11/16/2015 1906 11/10/2015 2330 11/15/15 0457  NA 144  144 145 141  K 4.3  4.1 4.3 4.0  CL 111  112* 116* 116*  CO2 19* 17* 15*  GLUCOSE 142*  137* 179* 181*  BUN 29*  31* 31* 32*  CREATININE 1.80*  1.70* 1.82* 1.83*  CALCIUM 9.4 8.0* 7.5*  MG  --  2.0 1.8  PHOS  --  4.5  --    Estimated Creatinine Clearance: 33.9 mL/min (by C-G formula based on Cr of 1.83).   LIVER  Recent  Labs Lab 11/15/15 0457  AST 468*  ALT 152*  ALKPHOS 115  BILITOT 1.4*  PROT 5.1*  ALBUMIN 2.7*     INFECTIOUS  Recent Labs Lab 11/15/15 0030 11/15/15 0301  LATICACIDVEN 4.8* 2.7*     ENDOCRINE CBG (last 3)   Recent Labs  11/15/15 0124 11/15/15 0322 11/15/15 0748  GLUCAP 187* 152* 155*         IMAGING x48h  - image(s) personally visualized  -   highlighted in bold Dg Chest Port 1 View  11/15/2015  CLINICAL DATA:  Central line placement.  Initial encounter. EXAM: PORTABLE CHEST 1 VIEW COMPARISON:  Chest radiograph performed 11/13/2015 FINDINGS: The patient's endotracheal tube is seen ending 2-3 cm above the carina. An enteric tube is noted extending below the diaphragm. A left IJ line is noted ending about the mid to distal SVC. The lungs are hypoexpanded. Mild bibasilar opacities may reflect atelectasis or possibly mild pneumonia. A small left pleural effusion is suspected. No pneumothorax is seen. The cardiomediastinal silhouette is mildly enlarged. The patient is status post median sternotomy. A ventricular catheter is noted. No acute osseous abnormalities are identified. IMPRESSION: 1. Endotracheal tube seen ending 2-3 cm above the carina. 2. Left IJ line noted ending about the mid to distal SVC. 3. Lungs hypoexpanded. Mild bibasilar airspace opacities may reflect atelectasis or possibly mild pneumonia. Suspect small left pleural effusion. 4. Mild cardiomegaly. Electronically Signed   By: Roanna Raider M.D.   On: 11/15/2015 01:09   Portable Chest Xray  11/13/2015  CLINICAL DATA:  Endotracheal tube placement.  Initial encounter. EXAM: PORTABLE CHEST 1 VIEW COMPARISON:  Chest radiograph performed 10/19/2015 FINDINGS: The patient's endotracheal tube is noted ending overlying the right mainstem bronchus, 2 cm below the carina. This should be retracted 5-6 cm. The balloon also appears slightly overinflated. The lungs are hypoexpanded. Bibasilar opacities may reflect  atelectasis or possibly mild infection. No definite pleural effusion or pneumothorax is seen. The cardiomediastinal silhouette is borderline enlarged. A catheter is noted overlying the heart. The patient is status post median sternotomy. There has been interval healing of the right lower lateral rib fracture. No acute osseous abnormalities are identified. IMPRESSION: 1. Endotracheal tube seen ending overlying the right mainstem bronchus, 2 cm below the carina. This should be retracted 5-6 cm. The balloon also appears slightly overinflated. This could be deflated slightly. 2. Lungs hypoexpanded. Bibasilar airspace opacities may reflect atelectasis or possibly mild infection. 3. Borderline cardiomegaly. These results were called by telephone at the time of interpretation on 10/26/2015 at 9:37 pm to [facility] RN on [facility], who verbally acknowledged these results. Electronically Signed   By: Roanna Raider M.D.   On:  11/15/2015 21:42   Dg Abd Portable 1v  11/08/2015  CLINICAL DATA:  Encounter for OG tube placement. EXAM: PORTABLE ABDOMEN - 1 VIEW COMPARISON:  CT 10/19/2015 FINDINGS: Tip and side port of the enteric tube below the diaphragm in the stomach. Mild gaseous gastric distention. No small bowel dilatation. Scoliotic curvature of the spine with posterior fusion hardware at the lumbosacral junction. Posterior right rib fractures seen. IMPRESSION: Tip and side port of the enteric tube below the diaphragm in the stomach. Electronically Signed   By: Rubye Oaks M.D.   On: 11/02/2015 23:10      DISCUSSION: 70 year old male with PMH of CAD s/p remote CABG and pulmonary fibrosis presented to [facility] with chest pain, found to be STEMI and transferred to [facility] for [facility]. Stenting to SVG with DES complicated by PEA arrest 25 mins. Post arrest was waking up and following some commands. Impella in place.   ASSESSMENT / PLAN:  PULMONARY A: VDRF in setting cardiac arrest Pulmonary fibrosis secondary to  Rheumatoid lung vs methotrexate v opportunstic infection - declined bronch fall 2016   - does not meet sbt criteria  P:   Full vent support HRCt when able  CARDIOVASCULAR A:  Cardiac arrest - 25 mins PEA Cardiogenic shock - Impella in place. STEMI - s/p PCI Atrail fibrillation with controlled ventricular response. CAD s/p CABG   - on impella and pressors  P:  Per cads  RENAL A:   Acute on Chronic Renal Failure Metabolic acidosis   - holding steady creat  P:   Repeat and trend BMP for renal function Correct electrolytes as indicated  foley to monitor UOP  GASTROINTESTINAL A:   GERD  P:   Protonix IV daily NPO Start TF  HEMATOLOGIC A:   Anemia - mild Thrombocytopenia - mild  P:  Anticoagulation/antiplatelete per cardiology Follow CBC  INFECTIOUS A:   No acute issues  P:   Monitor off ABX  ENDOCRINE A:   DM Hypothyroid Chronic Prednisone Use - For RA.    P:   TSH CBG monitoring q4 hours and SSI Continue home synthroid with IV dosing Solu-Cortef 50mg  IV q12hr  AUTOIMMUNE A:   Rheumatoid arthritis - Chronic Prednisone use.    P:   Stress dose steroids - 50 IV q12hr Hold home xeljanz   NEUROLOGIC A:   Acute hypoxemic encephalopathy - Improving. Reportedly squeezing hand post code.   - following commands c/o groin pain  P:   RASS goal: -1 Propofol infusion for sedation PRN fentanyl for analgesia Daily WUA   FAMILY  - Updates: Family updated by Cardiology     The patient is critically ill with multiple organ systems failure and requires high complexity decision making for assessment and support, frequent evaluation and titration of therapies, application of advanced monitoring technologies and extensive interpretation of multiple databases.   Critical Care Time devoted to patient care services described in this note is  30  Minutes. This time reflects time of care of this signee Dr Kalman Shan. This critical care time  does not reflect procedure time, or teaching time or supervisory time of PA/NP/Med student/Med Resident etc but could involve care discussion time    Dr. Kalman Shan, M.D., [facility].C.P Pulmonary and Critical Care Medicine Staff Physician [facility] [place] [facility]monary and Critical Care Pager: [phone], If no answer or between  15:00h - 7:00h: call [phone]  11/15/2015 10:15 AM

## 2015-11-15 NOTE — Progress Notes (Signed)
ANTICOAGULATION CONSULT NOTE - Follow-up Consult  Pharmacy Consult for Heparin Indication: post-STEMI with Impella CP  Allergies  Allergen Reactions  . Clindamycin/Lincomycin Other (See Comments)    Kidney Failure  . Lisinopril Cough  . Amoxicillin Hives and Other (See Comments)    Hallucinations   . Ciprofloxacin Other (See Comments)    C-diff  . Other Hives, Diarrhea and Other (See Comments)    "Mycin" Causes C-diff  . Ceftin [Cefuroxime Axetil]     Taste like rusty iron, nausea,  . Doxycycline Nausea And Vomiting  . Levofloxacin Nausea And Vomiting    Patient Measurements: Height: 5\' 6"  (167.6 cm) Weight: 151 lb 7.3 oz (68.7 kg) IBW/kg (Calculated) : 63.8 Heparin Dosing Weight: 62.1 kg   Vital Signs: Temp: 97.5 F (36.4 C) (02/22 0000) Temp Source: Oral (02/21 1844) BP: 152/129 mmHg (02/22 0215) Pulse Rate: 110 (02/22 0215)  Labs:  Recent Labs  11/01/2015 1906 11/06/2015 2330  HGB 10.1*  11.6*  --   HCT 32.9*  34.0*  --   PLT 143*  --   CREATININE 1.80*  1.70* 1.82*  TROPONINI  --  25.67*    Estimated Creatinine Clearance: 34.1 mL/min (by C-G formula based on Cr of 1.82).  Assessment: 70 y/o M with STEMI presents for emergent cath. S/p cath with PCI of SVG-OM1 and 2. Pt coded in cath lab with ROSC after 25 min of CPR with purposeful movement. Impella CP placed for cardiogenic shock.   Pt taken off aspirin and Plavix because of a spontaneous left flank hematoma with significant blood loss.  Post STEMI orders (per Dr. 66):  - Bival started in cath lab 1.75 mg/kg/hr   - Plavix 600mg  ordered via OG when inserted - given 2300  - then run Bival x 2 more hrs>> 2300-0100  - then off x 2 hrs>> 0100-0300  - then start systemic IV heparin 0300  Anticoagulation: Total heparin (systemic + purge solution) should start at 8-10 units/kg/hr (550-680 units/hr for 68 kg patient). Purge solution containing 50 units/ml of heparin. Currently running at 10 ml/hr so  providing 500 units/hr in purge.  Goal of Therapy:  ACT 160-180 Monitor platelets by anticoagulation protocol: Yes   Plan:  - At 0300, start IV heparin at 200 units/hr  - Check ACT every 1 hr and RN to adjust based on orderset - Pharmacy will continue to follow  Excell Seltzer, PharmD, BCPS Clinical pharmacist, pager [phone] 11/15/2015,2:23 AM

## 2015-11-15 NOTE — Progress Notes (Signed)
Impella rep called asking about numbers, advised to maybe give bolus to increase flow of impella to avoid suction alarms, CVP 9-10, Flow 2.7, called cards fellow Vora, orders to give 250 cc bolus. Will continue to monitor closely.

## 2015-11-15 NOTE — Progress Notes (Signed)
Called pharmacy, ACT 245, then hour later 229, Pharmacist said to hold heparin for another hour and recheck ACT. Will continue to monitor closely.

## 2015-11-15 NOTE — Progress Notes (Signed)
Dr. Teressa Lower advanced impella with echo guidance. VSS. Will continue to monitor closely.

## 2015-11-15 NOTE — Progress Notes (Addendum)
Updated Dr. Delene Ruffini on Coox of 49.8. Power level increased to 8 per verbal order. Will repeat coox in an hour and continue to monitor closely.

## 2015-11-15 NOTE — Care Management Note (Signed)
Case Management Note  Patient Details  Name: [name] MRN: [id number] Date of Birth: 1946/01/20  Subjective/Objective:      Adm w cardiogenic shock    , vent         , pcp dr Hyman Hopes   Expected Discharge Date:                  Expected Discharge Plan:     In-House Referral:     Discharge planning Services     Post Acute Care Choice:    Choice offered to:     DME Arranged:    DME Agency:     HH Arranged:    HH Agency:     Status of Service:     Medicare Important Message Given:    Date Medicare IM Given:    Medicare IM give by:    Date Additional Medicare IM Given:    Additional Medicare Important Message give by:     If discussed at Long Length of Stay Meetings, dates discussed:    Additional Comments: ur review done  Hanley Hays, RN 11/15/2015, 9:49 AM

## 2015-11-15 NOTE — Progress Notes (Signed)
Echocardiogram 2D Echocardiogram has been performed.  [name] 11/15/2015, 4:42 PM

## 2015-11-15 NOTE — Procedures (Signed)
Central Venous Catheter Insertion Procedure Note KYLAND NO [id number] 11-Feb-1946  Procedure: Insertion of Central Venous Catheter Indications: Assessment of intravascular volume  Procedure Details Consent: Risks of procedure as well as the alternatives and risks of each were explained to the (patient/caregiver).  Consent for procedure obtained. Time Out: Verified patient identification, verified procedure, site/side was marked, verified correct patient position, special equipment/implants available, medications/allergies/relevent history reviewed, required imaging and test results available.  Performed  Maximum sterile technique was used including antiseptics, cap, gloves, gown, hand hygiene, mask and sheet. Skin prep: Chlorhexidine; local anesthetic administered A antimicrobial bonded/coated triple lumen catheter was placed in the left internal jugular vein using the Seldinger technique. Ultrasound guidance used.Yes.   Catheter placed to 20 cm. Blood aspirated via all 3 ports and then flushed x 3. Line sutured x 2 and dressing applied.  Evaluation Blood flow good Complications: No apparent complications Patient did tolerate procedure well. Chest X-ray ordered to verify placement.  CXR: pending.  Joneen Roach, AGACNP-BC [facility] Pulmonology/Critical Care Pager [phone] or [phone]  11/15/2015 12:47 AM

## 2015-11-15 NOTE — Consult Note (Signed)
Brief Echo and Impella Note  Per protocol, echocardiogram obtained after Impella placed in the cath lab. Under US Echo guidance with the rep and echosonographer present for live continuous imaging, the catheter was withdrawn to 76 cm so that the distal tip of the impella was ~ 3.2 cm from AV. Flow readings reviewed good pulsatility without ventricularization up to P8.   Limited views obtained with artifact introduced from impella but my read EF ~ 15% with diffuse WMA and mobile RV upper limits of normal size seen on parasternal long axis view and subcostal views but no apical 4 chamber views were able to be obtained.  Leeann Must, MD

## 2015-11-15 NOTE — Progress Notes (Signed)
[facility]  Patient Status: Active (receiving services up to time of hospitalization)  [facility] is providing the following services: RN and PT  If patient discharges after hours, please call [phone].   [name] 11/15/2015, 10:56 AM

## 2015-11-15 NOTE — Progress Notes (Signed)
Physician, Dr. Gala Romney, advanced impella guided by echo. Pt VS remained stable. Very few PVCs noted on monitor.

## 2015-11-15 NOTE — Consult Note (Signed)
   [facility] CM Inpatient Consult   11/15/2015  [name] 1945-10-26 [id number]   Made aware of patient being admitted by [facility]. [name] is active with [facility] Care Management program. Micah Flesher to bedside to speak with him. However, he is sedated on the vent. Will continue to follow. Will make inpatient RNCM aware that patient is active with [facility] Care Management.   Raiford Noble, MSN-Ed, RN,BSN [facility] Liaison [phone]

## 2015-11-15 NOTE — Progress Notes (Signed)
Lactic 4.8, elink nurse notified.

## 2015-11-15 NOTE — Progress Notes (Signed)
EKG CRITICAL VALUE     12 lead EKG performed.  Critical value noted. Renold Don, RN notified.   Wandalee Ferdinand, [place] 11/15/2015 7:56 AM

## 2015-11-15 NOTE — Progress Notes (Signed)
Initial Nutrition Assessment  INTERVENTION:   Initiate enteral nutrition with Vital 1.2 at 25 mL/hr via OG tube to increase by 10 mL/hr every 4 hours to a goal rate of 65 mL/hr.  Tube feeding regimen will provide 1872 kcal, 117 grams protein, and 1265 mL water.    NUTRITION DIAGNOSIS:   Inadequate oral intake related to inability to eat as evidenced by NPO status.  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 Presented with chest pain. EKG indicated posterior STEMI and he was taken emergently for cardiac cath.  Suffered PEA cardiac arrest while in cath lab. Intubated during code, taken to ICU for recovery.   Family reports patient had minimal appetite PTA.  States that he eats enough to take his medications.  Wife expressed concern about tube feeding being comfortable for the pt due to past hx of acid reflux.  Explained that tube feeding would go directly into pt's stomach through OG tube and should not cause discomfort.  Patient is currently intubated on ventilator support. MV: 14.3 mL/min Temp (24 hrs): Avg: 98 F (36.8 C), Min: 97.5 F (36.4 C), Max: 99.8 F (37.7 C) OG Tube, tip in stomach Propofol: off  Nutrition Focused Physical Exam was completed.  Findings include mild fat depletion in the upper arm region, mild muscle depletion in the temple region, and no edema.  All other areas were found to be well nourished.   Medications reviewed and include novolog.  Labs reviewed and include elevated glucose (181), elevated BUN and creatinine.  Diet Order:  Diet NPO time specified  Skin:  Wound (see comment) (Stage 2 PU on  Sacrum)  Last BM:  Unknown  Height:   Ht Readings from Last 1 Encounters:  10/25/2015 5\' 6"  (1.676 m)    Weight:   Wt Readings from Last 1 Encounters:  11/15/15 151 lb 7.3 oz (68.7 kg)    Ideal Body Weight:  64.5 kg  BMI:  Body mass index is 24.46 kg/(m^2).  Estimated Nutritional Needs:   Kcal:  1866  Protein:  >/= 90  Fluid:  >/= 2L  EDUCATION NEEDS:   No education needs identified at this time  11/20/2015, Dietetic Intern Pager: [phone]

## 2015-11-16 ENCOUNTER — Ambulatory Visit: Payer: Medicare Other | Admitting: Family Medicine

## 2015-11-16 DIAGNOSIS — K559 Vascular disorder of intestine, unspecified: Secondary | ICD-10-CM

## 2015-11-16 DIAGNOSIS — Z95811 Presence of heart assist device: Secondary | ICD-10-CM

## 2015-11-16 DIAGNOSIS — R579 Shock, unspecified: Secondary | ICD-10-CM

## 2015-11-16 LAB — TYPE AND SCREEN
ABO/RH(D): A POS
Antibody Screen: NEGATIVE

## 2015-11-16 LAB — CBC WITH DIFFERENTIAL/PLATELET
BASOS ABS: 0 10*3/uL (ref 0.0–0.1)
Basophils Relative: 0 %
EOS ABS: 0 10*3/uL (ref 0.0–0.7)
Eosinophils Relative: 0 %
HCT: 26.5 % — ABNORMAL LOW (ref 39.0–52.0)
Hemoglobin: 8.6 g/dL — ABNORMAL LOW (ref 13.0–17.0)
LYMPHS PCT: 5 %
Lymphs Abs: 0.8 10*3/uL (ref 0.7–4.0)
MCH: 30.5 pg (ref 26.0–34.0)
MCHC: 32.5 g/dL (ref 30.0–36.0)
MCV: 94 fL (ref 78.0–100.0)
MONO ABS: 1 10*3/uL (ref 0.1–1.0)
Monocytes Relative: 6 %
NEUTROS PCT: 89 %
Neutro Abs: 14.6 10*3/uL — ABNORMAL HIGH (ref 1.7–7.7)
PLATELETS: 128 10*3/uL — AB (ref 150–400)
RBC: 2.82 MIL/uL — ABNORMAL LOW (ref 4.22–5.81)
RDW: 16.2 % — AB (ref 11.5–15.5)
WBC: 16.4 10*3/uL — AB (ref 4.0–10.5)

## 2015-11-16 LAB — POCT I-STAT 3, ART BLOOD GAS (G3+)
ACID-BASE DEFICIT: 6 mmol/L — AB (ref 0.0–2.0)
Bicarbonate: 17.4 mEq/L — ABNORMAL LOW (ref 20.0–24.0)
O2 SAT: 99 %
PH ART: 7.401 (ref 7.350–7.450)
PO2 ART: 160 mmHg — AB (ref 80.0–100.0)
Patient temperature: 99.4
TCO2: 18 mmol/L (ref 0–100)
pCO2 arterial: 28.2 mmHg — ABNORMAL LOW (ref 35.0–45.0)

## 2015-11-16 LAB — GLUCOSE, CAPILLARY
GLUCOSE-CAPILLARY: 169 mg/dL — AB (ref 65–99)
GLUCOSE-CAPILLARY: 170 mg/dL — AB (ref 65–99)
GLUCOSE-CAPILLARY: 161 mg/dL — AB (ref 65–99)
Glucose-Capillary: 119 mg/dL — ABNORMAL HIGH (ref 65–99)
Glucose-Capillary: 130 mg/dL — ABNORMAL HIGH (ref 65–99)

## 2015-11-16 LAB — POCT ACTIVATED CLOTTING TIME
ACTIVATED CLOTTING TIME: 173 s
ACTIVATED CLOTTING TIME: 178 s
ACTIVATED CLOTTING TIME: 162 s
ACTIVATED CLOTTING TIME: 173 s
Activated Clotting Time: 167 seconds
Activated Clotting Time: 167 seconds
Activated Clotting Time: 167 seconds
Activated Clotting Time: 173 seconds

## 2015-11-16 LAB — BASIC METABOLIC PANEL
Anion gap: 11 (ref 5–15)
BUN: 52 mg/dL — AB (ref 6–20)
CALCIUM: 7 mg/dL — AB (ref 8.9–10.3)
CHLORIDE: 115 mmol/L — AB (ref 101–111)
CO2: 15 mmol/L — AB (ref 22–32)
CREATININE: 2.74 mg/dL — AB (ref 0.61–1.24)
GFR calc non Af Amer: 22 mL/min — ABNORMAL LOW (ref >60)
GFR, EST AFRICAN AMERICAN: 25 mL/min — AB (ref >60)
Glucose, Bld: 181 mg/dL — ABNORMAL HIGH (ref 65–99)
Potassium: 4.2 mmol/L (ref 3.5–5.1)
SODIUM: 141 mmol/L (ref 135–145)

## 2015-11-16 LAB — TRIGLYCERIDES: Triglycerides: 136 mg/dL (ref <150)

## 2015-11-16 LAB — MAGNESIUM: MAGNESIUM: 2.1 mg/dL (ref 1.7–2.4)

## 2015-11-16 LAB — CARBOXYHEMOGLOBIN
Carboxyhemoglobin: 1.7 % — ABNORMAL HIGH (ref 0.5–1.5)
Methemoglobin: 1.2 % (ref 0.0–1.5)
O2 Saturation: 61.3 %
TOTAL HEMOGLOBIN: 8.6 g/dL — AB (ref 13.5–18.0)

## 2015-11-16 LAB — LACTATE DEHYDROGENASE: LDH: 3831 U/L — AB (ref 98–192)

## 2015-11-16 LAB — OCCULT BLOOD X 1 CARD TO LAB, STOOL: Fecal Occult Bld: POSITIVE — AB

## 2015-11-16 MED ORDER — SODIUM CHLORIDE 0.9 % IV BOLUS (SEPSIS)
250.0000 mL | Freq: Once | INTRAVENOUS | Status: AC
  Administered 2015-11-16: 250 mL via INTRAVENOUS

## 2015-11-16 MED ORDER — SODIUM CHLORIDE 0.9% FLUSH
10.0000 mL | Freq: Two times a day (BID) | INTRAVENOUS | Status: DC
  Administered 2015-11-16 (×2): 10 mL

## 2015-11-16 MED ORDER — SODIUM CHLORIDE 0.9% FLUSH: 10.0000 mL | INTRAVENOUS | Status: DC | PRN

## 2015-11-16 MED ORDER — SODIUM CHLORIDE 0.9 % IV SOLN
1.0000 mg/h | INTRAVENOUS | Status: DC
  Administered 2015-11-16: 2 mg/h via INTRAVENOUS
  Administered 2015-11-16: 4 mg/h via INTRAVENOUS
  Filled 2015-11-16 (×2): qty 10

## 2015-11-16 NOTE — Progress Notes (Signed)
Name: [name] MRN: [id number] DOB: [birth year]    ADMISSION DATE:  11/19/2015 CONSULTATION DATE:  10/30/2015  REFERRING MD :Dr. Excell Seltzer  CHIEF COMPLAINT:  PEA arrest  BRIEF PATIENT DESCRIPTION: 70 year old male with PMH as below, which includes CAD s/p remote CABG, HTN, CVA, pulmonary fibrosis secondary to chronic methotrexate and RA (MR patient), Afib, and hypothyroidism. He presented to [facility] 2/21 with complaints of intermittent chest pain for 5 days described as "someone standing on chest". He has recently stopped asa and plavix in January due to spontaneous flank hematoma. Associated SOB. 2/21 pain became more severe. EKG indicated posterior STEMI and he was taken emergently for cardiac cath where reportedly a graft was found to be occluded,  Patient suffered PEA cardiac arrest while in cath lab. Underwent 25 mins of ACLS prior to ROSC. Intubated during code. Impella placed. Post arrest patient was purposeful and following commands. Requiring high dose vasopressors. He was transferred to the ICU for recovery  SIGNIFICANT EVENTS  STEMI >11/15/15 PEA arrest 11/15/15 ET tube 11/15/15 Impella 11/15/15  STUDIES:  2/21 LHC > Critical stenosis of the SVG sequential to OM1, OM2, with significant PCI via DES. Known left main, RCA, SVG-RCA occlusions  2.Fecal ocult blood >2/23 positive   SUBJECTIVE: Patient remains intubated, on vent support and impella.  Patient is able to respond when prompted , nods to yes or no questions  VITAL SIGNS: Temp:  [97.5 F (36.4 C)-101.9 F (38.8 C)] 99.9 F (37.7 C) (02/23 1133) Pulse Rate:  [30-108] 93 (02/23 1100) Resp:  [20-32] 25 (02/23 1100) BP: (83-135)/(45-116) 94/55 mmHg (02/23 1100) SpO2:  [94 %-100 %] 100 % (02/23 1100) Arterial Line BP: (79-152)/(55-121) 103/59 mmHg (02/23 1100) FiO2 (%):  [40 %-50 %] 40 % (02/23 0940) Weight:  [210 lb 12.2 oz (95.6 kg)] 210 lb 12.2 oz (95.6 kg) (02/23 0500)  PHYSICAL EXAMINATION: General: ill  appearing elderly gentleman, intubated on vent with  Neuro:  Opens eyes to voice. Able to nod to yes or no questions HEENT:  Atraumatic, normocephalic, no dischage, white sclera Cardiovascular:  Regular, no MRG Lungs: clear bilaterally,no rhonci, wheezes rales noted. Abdomen:  Tender on deep palpation, hypoactive BS Skin:   Skin tear to the left arm   Recent Labs Lab 10/31/2015 2330 11/15/15 0457 11/16/15 0426  NA 145 141 141  K 4.3 4.0 4.2  CL 116* 116* 115*  CO2 17* 15* 15*  BUN 31* 32* 52*  CREATININE 1.82* 1.83* 2.74*  GLUCOSE 179* 181* 181*    Recent Labs Lab 11/16/2015 1906 11/15/15 0457 11/16/15 0426  HGB 10.1*  11.6* 10.6* 8.6*  HCT 32.9*  34.0* 34.2* 26.5*  WBC 10.7* 19.6* 16.4*  PLT 143* 251 128*   Dg Chest Port 1 View  11/15/2015  CLINICAL DATA:  Central line placement.  Initial encounter. EXAM: PORTABLE CHEST 1 VIEW COMPARISON:  Chest radiograph performed 11/05/2015 FINDINGS: The patient's endotracheal tube is seen ending 2-3 cm above the carina. An enteric tube is noted extending below the diaphragm. A left IJ line is noted ending about the mid to distal SVC. The lungs are hypoexpanded. Mild bibasilar opacities may reflect atelectasis or possibly mild pneumonia. A small left pleural effusion is suspected. No pneumothorax is seen. The cardiomediastinal silhouette is mildly enlarged. The patient is status post median sternotomy. A ventricular catheter is noted. No acute osseous abnormalities are identified. IMPRESSION: 1. Endotracheal tube seen ending 2-3 cm above the carina. 2. Left IJ line noted  ending about the mid to distal SVC. 3. Lungs hypoexpanded. Mild bibasilar airspace opacities may reflect atelectasis or possibly mild pneumonia. Suspect small left pleural effusion. 4. Mild cardiomegaly. Electronically Signed   By: Roanna Raider M.D.   On: 11/15/2015 01:09   Portable Chest Xray  11/12/2015  CLINICAL DATA:  Endotracheal tube placement.  Initial encounter. EXAM:  PORTABLE CHEST 1 VIEW COMPARISON:  Chest radiograph performed 10/19/2015 FINDINGS: The patient's endotracheal tube is noted ending overlying the right mainstem bronchus, 2 cm below the carina. This should be retracted 5-6 cm. The balloon also appears slightly overinflated. The lungs are hypoexpanded. Bibasilar opacities may reflect atelectasis or possibly mild infection. No definite pleural effusion or pneumothorax is seen. The cardiomediastinal silhouette is borderline enlarged. A catheter is noted overlying the heart. The patient is status post median sternotomy. There has been interval healing of the right lower lateral rib fracture. No acute osseous abnormalities are identified. IMPRESSION: 1. Endotracheal tube seen ending overlying the right mainstem bronchus, 2 cm below the carina. This should be retracted 5-6 cm. The balloon also appears slightly overinflated. This could be deflated slightly. 2. Lungs hypoexpanded. Bibasilar airspace opacities may reflect atelectasis or possibly mild infection. 3. Borderline cardiomegaly. These results were called by telephone at the time of interpretation on 11/10/2015 at 9:37 pm to [facility] RN on [facility], who verbally acknowledged these results. Electronically Signed   By: Roanna Raider M.D.   On: 11/01/2015 21:42   Dg Abd Portable 1v  11/01/2015  CLINICAL DATA:  Encounter for OG tube placement. EXAM: PORTABLE ABDOMEN - 1 VIEW COMPARISON:  CT 10/19/2015 FINDINGS: Tip and side port of the enteric tube below the diaphragm in the stomach. Mild gaseous gastric distention. No small bowel dilatation. Scoliotic curvature of the spine with posterior fusion hardware at the lumbosacral junction. Posterior right rib fractures seen. IMPRESSION: Tip and side port of the enteric tube below the diaphragm in the stomach. Electronically Signed   By: Rubye Oaks M.D.   On: 11/13/2015 23:10    ASSESSMENT / PLAN: A VDRF in setting cardiac arrest Pulmonary fibrosis  P Continue vent  support Wean as tolerated High resolution CT when able Does not meet SBT criteria CXR in am   Cardiac arrest - 25 mins PEA Cardiogenic shock - Impella in place. STEMI - s/p PCI Atrail fibrillation with controlled ventricular response. CAD s/p CABG P Managed by cardiology  A Acute on Chronic Renal Failure P Trend BMP Continue to monitor intake /output very closely  May require CRRT if creatinine keeps trending up  Tallan Sandoz,AG-ACNP Pulmonary & Critical Care Pulmonary and Critical Care Medicine [facility] Pager: [phone]  11/16/2015, 12:02 PM

## 2015-11-16 NOTE — Progress Notes (Signed)
Marooned colored stool observed by RN. Hemocult collected and sent stat. VSS. Md notified. Will continue to monitor and assess pt closely.

## 2015-11-16 NOTE — Progress Notes (Signed)
Subjective:  Presented with an acute inferoposterior STEMI with DES placed to SVG-OM1 and 2. He had a cardiac arrest during diagnostic angiography. ROSC was achieved after 25 minutes of CPR. An Impella was placed.   He is currently requiring Levophed and milrinonend. He continues on impella. Now with melena.   He is awake on the vent. Poor urine output.   Objective:  Vital Signs in the last 24 hours: Temp:  [97.5 F (36.4 C)-101.9 F (38.8 C)] 99.9 F (37.7 C) (02/23 1133) Pulse Rate:  [30-117] 111 (02/23 1345) Resp:  [17-32] 24 (02/23 1345) BP: (83-135)/(45-116) 99/78 mmHg (02/23 1321) SpO2:  [95 %-100 %] 97 % (02/23 1345) Arterial Line BP: (72-152)/(45-121) 119/63 mmHg (02/23 1345) FiO2 (%):  [40 %-50 %] 40 % (02/23 1321) Weight:  [210 lb 12.2 oz (95.6 kg)] 210 lb 12.2 oz (95.6 kg) (02/23 0500)  Intake/Output from previous day: 02/22 0701 - 02/23 0700 In: 2898.6 [I.V.:1128.6; NG/GT:957.5; IV Piggyback:550] Out: 512 [Urine:212; Emesis/NG output:300]  Physical Exam: General: elderly man on vent, NAD, awake HEENT: [place]/AT, EOMI, ETT in place Neck: supple, no JVD appreciated  Lungs: CTA bilaterally, on vent  CV: RRR, hum from impella noted  Abd: BS+, soft, tender Ext: no evidence of hematoma in R groin, trace-1+ peripheral edema, legs externally rotated  Skin: warm/dry no rash   Lab Results:  Recent Labs  11/15/15 0457 11/16/15 0426  WBC 19.6* 16.4*  HGB 10.6* 8.6*  PLT 251 128*    Recent Labs  11/15/15 0457 11/16/15 0426  NA 141 141  K 4.0 4.2  CL 116* 115*  CO2 15* 15*  GLUCOSE 181* 181*  BUN 32* 52*  CREATININE 1.83* 2.74*    Recent Labs  11/15/15 0457 11/15/15 1017  TROPONINI >65.00* >65.00*    Cardiac Studies: Left Heart Cath with Angiography November 20, 2015 1. Known total occlusion of the left main and RCA 2. Known total occlusion of the SVG-RCA 3. Continued patency of the LIMA to diagonal and LAD 4. Critical stenosis of the SVG sequential to OM1  and OM2 5. Intraprocedural cardiac arrest (during diagnostic angiography) 6. Successful PCI of the SVG-OM1 and 2 (DES platform) 7. ROSC in the cath lab after 25 minutes of CPR with purposeful movement 8. Cardiogenic shock with successful percutaneous placement of an Impella CP     Tele: PVCs  Assessment/Plan:  Mr. Tavella is a 70yo man with hx of CAD s/p CABG who presented with an acute inferoposterior STEMI after being off Plavix and ASA for 1 month due to a spontaneous left flank hematoma. He had a DES placed to the SVG-OM1 and 2. Unfortunately he had a cardiac arrest during cath and ROSC was achieved after 25 mins of CPR. An Impella device was placed due to cardiogenic shock.   1. Cardiogenic Shock: He had cardiac arrest during coronary angiography. ROSC achieved after 25 mins CPR. He is stable while on the Impella. Pressures being supported with Levophed and milrinone.  Continues to decline despite impella.  2. Acute STEMI in setting of discontinuation of antiplatelets, known CAD s/p CABG: He had been off of antiplatelet therapy for the last month due to a spontaneous left flank hematoma. He was found to have an inferoposterior STEMI with DES placed to the SVG OM1 and 2. Troponins peaked to >65. EKG improved this morning.  - Continue ASA. He received Plavix 600 mg once post cath.  - Continue heparin gtt- 3. Left Flank Hematoma: Occurred spontaneously about 1 month  ago. He was taken off ASA and Plavix due to this event.  4. Rheumatoid Arthritis on Chronic Steroids: He has advanced disease.  5. CKD Stage III: Cr 1.8 on admission and up to 2.7 with no urine output. 6. Normocytic Anemia in setting of recent bleeding events 7. Chronic ILD: Secondary to rheumatoid arthritis vs methotrexate therapy 8. Melena- likely ischemic bowel 9.Abdominal Pain- ? Ischemic bowel . Off tube feeds.  10. Acute Respiratory Failure-Remains intubated   Dr Gala Romney withdrawing today due to multisystem organ  failure.   Amy Clegg NP-C  2:42 PM  Patient seen and examined with Tonye Becket, NP. We discussed all aspects of the encounter. I agree with the assessment and plan as stated above.   He now has multisystem organ failure with worsening renal failure (anuric), ischemic bowel and worsening respiratory failure despite Impella support and increasing doses of norepinephrine. I have told the family that he will not survive. We will make him comfort care with no escalation of therapies. Plan withdrawal of support tomorrow. Impella repositioned under echo guidance personally at bedside.   The patient is critically ill with multiple organ systems failure and requires high complexity decision making for assessment and support, frequent evaluation and titration of therapies, application of advanced monitoring technologies and extensive interpretation of multiple databases.   Critical Care Time devoted to patient care services described in this note is 45 Minutes.  Noble Cicalese,MD 3:02 PM

## 2015-11-16 NOTE — Progress Notes (Signed)
LDH collected from art line. Lab informed RN specimen hemolyzed. RN recollected from CVC distal port. Lab informed RN specimen hemolyzed again. Phlebotomy to draw peripherally.

## 2015-11-17 ENCOUNTER — Other Ambulatory Visit: Payer: Self-pay | Admitting: *Deleted

## 2015-11-17 ENCOUNTER — Inpatient Hospital Stay (HOSPITAL_COMMUNITY): Payer: Medicare Other

## 2015-11-17 ENCOUNTER — Encounter: Payer: Self-pay | Admitting: *Deleted

## 2015-11-17 ENCOUNTER — Ambulatory Visit: Payer: Medicare Other | Admitting: Family Medicine

## 2015-11-17 LAB — POCT I-STAT 3, ART BLOOD GAS (G3+)
ACID-BASE DEFICIT: 23 mmol/L — AB (ref 0.0–2.0)
Bicarbonate: 6.7 mEq/L — ABNORMAL LOW (ref 20.0–24.0)
O2 Saturation: 88 %
PH ART: 6.896 — AB (ref 7.350–7.450)
TCO2: 8 mmol/L (ref 0–100)
pCO2 arterial: 35 mmHg (ref 35.0–45.0)
pO2, Arterial: 95 mmHg (ref 80.0–100.0)

## 2015-11-17 LAB — CBC WITH DIFFERENTIAL/PLATELET
BASOS PCT: 1 %
Basophils Absolute: 0.1 10*3/uL (ref 0.0–0.1)
EOS PCT: 0 %
Eosinophils Absolute: 0 10*3/uL (ref 0.0–0.7)
HEMATOCRIT: 15.5 % — AB (ref 39.0–52.0)
HEMOGLOBIN: 4.6 g/dL — AB (ref 13.0–17.0)
Lymphocytes Relative: 14 %
Lymphs Abs: 1.7 10*3/uL (ref 0.7–4.0)
MCH: 31.5 pg (ref 26.0–34.0)
MCHC: 29.7 g/dL — AB (ref 30.0–36.0)
MCV: 106.2 fL — ABNORMAL HIGH (ref 78.0–100.0)
MONO ABS: 0.6 10*3/uL (ref 0.1–1.0)
Monocytes Relative: 5 %
NEUTROS PCT: 80 %
Neutro Abs: 9.7 10*3/uL — ABNORMAL HIGH (ref 1.7–7.7)
Platelets: 90 10*3/uL — ABNORMAL LOW (ref 150–400)
RBC: 1.46 MIL/uL — ABNORMAL LOW (ref 4.22–5.81)
RDW: 22.6 % — ABNORMAL HIGH (ref 11.5–15.5)
WBC: 12.1 10*3/uL — ABNORMAL HIGH (ref 4.0–10.5)

## 2015-11-17 LAB — BASIC METABOLIC PANEL
ANION GAP: 21 — AB (ref 5–15)
BUN: 72 mg/dL — ABNORMAL HIGH (ref 6–20)
CO2: 9 mmol/L — ABNORMAL LOW (ref 22–32)
Calcium: 6.3 mg/dL — CL (ref 8.9–10.3)
Chloride: 105 mmol/L (ref 101–111)
Creatinine, Ser: 3.77 mg/dL — ABNORMAL HIGH (ref 0.61–1.24)
GFR, EST AFRICAN AMERICAN: 17 mL/min — AB (ref >60)
GFR, EST NON AFRICAN AMERICAN: 15 mL/min — AB (ref >60)
GLUCOSE: 110 mg/dL — AB (ref 65–99)
POTASSIUM: 7.1 mmol/L — AB (ref 3.5–5.1)
SODIUM: 135 mmol/L (ref 135–145)

## 2015-11-17 LAB — GLUCOSE, CAPILLARY
GLUCOSE-CAPILLARY: 134 mg/dL — AB (ref 65–99)
GLUCOSE-CAPILLARY: 115 mg/dL — AB (ref 65–99)

## 2015-11-17 LAB — POCT ACTIVATED CLOTTING TIME
ACTIVATED CLOTTING TIME: 203 s
ACTIVATED CLOTTING TIME: 209 s

## 2015-11-17 LAB — HEMOGLOBIN FREE, PLASMA: HGB PLASMA: 104.5 mg/dL — AB (ref 0.0–4.9)

## 2015-11-17 LAB — MAGNESIUM: Magnesium: 2.4 mg/dL (ref 1.7–2.4)

## 2015-11-17 LAB — PATHOLOGIST SMEAR REVIEW

## 2015-11-17 LAB — TRIGLYCERIDES: TRIGLYCERIDES: 70 mg/dL (ref <150)

## 2015-11-17 MED ORDER — SODIUM BICARBONATE 8.4 % IV SOLN
INTRAVENOUS | Status: AC
  Administered 2015-11-17: 50 meq
  Filled 2015-11-17: qty 50

## 2015-11-17 MED ORDER — SODIUM BICARBONATE 8.4 % IV SOLN: 50.0000 meq | Freq: Once | INTRAVENOUS | Status: AC

## 2015-11-17 MED ORDER — SODIUM BICARBONATE 8.4 % IV SOLN
50.0000 meq | Freq: Once | INTRAVENOUS | Status: AC
  Administered 2015-11-17: 50 meq via INTRAVENOUS

## 2015-11-17 MED ORDER — SODIUM BICARBONATE 8.4 % IV SOLN
50.0000 meq | Freq: Once | INTRAVENOUS | Status: AC
Start: 2015-11-17 — End: 2015-11-17
  Administered 2015-11-17: 50 meq via INTRAVENOUS

## 2015-11-17 MED ORDER — SODIUM CHLORIDE 0.9 % IV BOLUS (SEPSIS)
250.0000 mL | Freq: Once | INTRAVENOUS | Status: AC
  Administered 2015-11-17: 250 mL via INTRAVENOUS

## 2015-11-17 MED ORDER — SODIUM BICARBONATE 8.4 % IV SOLN
INTRAVENOUS | Status: AC
  Filled 2015-11-17: qty 50

## 2015-11-20 ENCOUNTER — Ambulatory Visit: Payer: Self-pay | Admitting: *Deleted

## 2015-11-20 ENCOUNTER — Telehealth: Payer: Self-pay

## 2015-11-20 LAB — CULTURE, BLOOD (ROUTINE X 2)
CULTURE: NO GROWTH
Culture: NO GROWTH

## 2015-11-20 NOTE — Telephone Encounter (Signed)
On 02/27/2017I received a death certificate from [facility] (original). The death certificate is for burial. The patient is a patient of Doctor Ramaswamy. The death certificate will be taken to pulmonary unit tomorrow am for signature. On 12/01/2015 I received the death certificate back unsigned from Doctor Ramaswamy. After reviewing the patient's chart we have determined that this d/c needs to go to the [facility] for Doctor Bensimhon to sign. I called Pat at the funeral home and spoke with her and she said that was fine that she would make contact with this office.

## 2015-11-22 NOTE — Progress Notes (Signed)
Asystole verified by Reymundo Poll RN at [phone].

## 2015-11-22 NOTE — Progress Notes (Signed)
CVP around 9-10 bp in the 80s-90s maxed on levo-notified Dr Shirlee Latch w/ order for 250 cc ns bolus.

## 2015-11-22 NOTE — Progress Notes (Addendum)
bp in the 60s,abg ph was 6.9 w/ 6.7 hco3 notified Dr Shirlee Latch w/ order togive  bicarb and NS bolus.also milrinone stop.

## 2015-11-22 NOTE — Accreditation Note (Signed)
200 cc of fentanyl and 20 cc versed of versed wasted  in the sink witness by Hollie Salk.

## 2015-11-22 NOTE — Discharge Summary (Signed)
Advanced Heart Failure Team  Discharge Summary   Patient ID: [name] MRN: [id number], DOB/AGE: 11-15-1945 70 y.o. Admit date: 12-02-15 D/C date:    11/16/2015    Primary Discharge Diagnoses:  1. Cardiogenic Shock 2. Acute STEMI in setting of discontinuation of antiplatelets, known CAD s/p CABG:  3. Left Flank Hematoma 4. Rheumatoid Arthritis on Chronic Steroids 5. CKD Stage III 7. Chronic ILD 8. Melena 9.Abdominal Pain 10. Acute Respiratory Failure 11. Metabolic Acidosis 12. Hyperkalemia  13. Hypoalbumin  Hospital Course:  [name] is a 70yo man with hx of CAD s/p CABG, pulmonary fibrosis and severe RA who presented with an acute inferoposterior STEMI after being off Plavix and ASA for 1 month due to a spontaneous left flank hematoma. He ws taken emergently to the cath  Unfortunately he had a cardiac arrest during cath and ROSC was achieved after 25 mins of CPR. During the time he underwent stenting and DES placement to the SVG-OM1 and 2.An Impella device was placed due to cardiogenic shock.  Despite aggressive support he developed multisystem organ failure with worsening renal failure (anuric), ischemic bowel and worsening respiratory failure. Dr Gala Romney had lengthy discussions with his family regarding the severity of condition. The family elected to transition to comfort care. He passed quietly with family at the bedside on February 24 , 2017 at 07:22 am.    Fernando Becket NP-C  11/20/2015, 8:34 PM  I agree with the summary as above.   Fernando Esbenshade,MD 10:19 PM

## 2015-11-22 NOTE — Progress Notes (Signed)
BP in the 70s cvp 9 suction alarms in the Impella-notified Dr Shirlee Latch w/ order for ns bolus 250.

## 2015-11-22 NOTE — Patient Outreach (Signed)
Notified by [facility] liaison patient deceased. Case closed per protocol. Alben Spittle. Albertha Ghee, RN, BSN, CCM  [facility] [employer] [phone]

## 2015-11-22 NOTE — Progress Notes (Signed)
RT removed breathing tube per RN request per patient passing. RN at bedside.

## 2015-11-22 NOTE — Progress Notes (Signed)
eLink Physician-Brief Progress Note Patient Name: [name] DOB: 10/28/1945 MRN: [id number]   Date of Service  11-29-2015  HPI/Events of Note  Ongoing metabolic acidosis with worsening renal function.  PH 6.9/35/95/6.7.  Has received 1 amp of bicarb IVP  eICU Interventions  Plan: Increase vent rate to 35 Additional amp of bicarb IVP Check lactate Consider RRT or Bicarb gtt     Intervention Category Major Interventions: Acid-Base disturbance - evaluation and management  DETERDING,ELIZABETH 11/29/15, 4:38 AM

## 2015-11-22 NOTE — Progress Notes (Signed)
Impella unplug/Dr Bensimhon.

## 2015-11-22 DEATH — deceased

## 2015-11-23 ENCOUNTER — Telehealth: Payer: Self-pay | Admitting: Internal Medicine

## 2015-11-23 NOTE — Telephone Encounter (Signed)
Pt's wife called to let us know that Mr Rho had passed away in mid Nov 14, 2022.

## 2015-11-23 NOTE — Telephone Encounter (Signed)
noted 

## 2015-11-23 NOTE — Telephone Encounter (Signed)
Noted  

## 2015-12-04 ENCOUNTER — Encounter: Payer: Self-pay | Admitting: *Deleted

## 2016-02-02 ENCOUNTER — Ambulatory Visit: Payer: Medicare Other | Admitting: Cardiology

## 2016-03-25 IMAGING — MR MR HEAD W/O CM
7 of 10 series · 30 of 48 positions shown · non-contrast
Comparison: CT of the head without contrast 06/24/2015.

CLINICAL DATA: Left CVA. Right-sided weakness and numbness
beginning 2 days ago. Fall in living room at the patient's home 2
nights ago.

EXAM:
MRI HEAD WITHOUT CONTRAST
TECHNIQUE: Multiplanar, multiecho pulse sequences of the brain and surrounding
structures were obtained without intravenous contrast.

[Series 2: [id number] · sagittal · 5.0mm · 0.41mm/px · 3 of 20 slices shown]
[im 1/20]
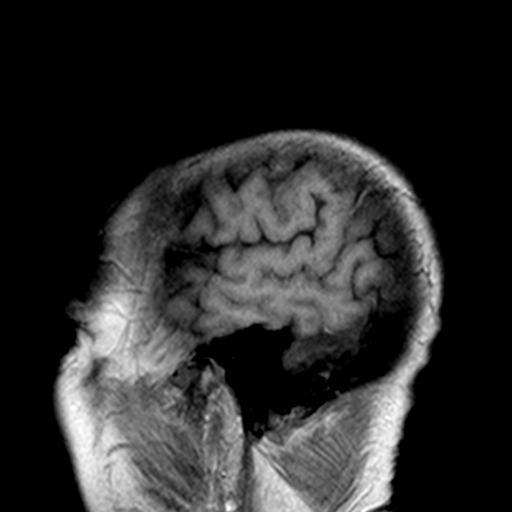
[im 10/20]
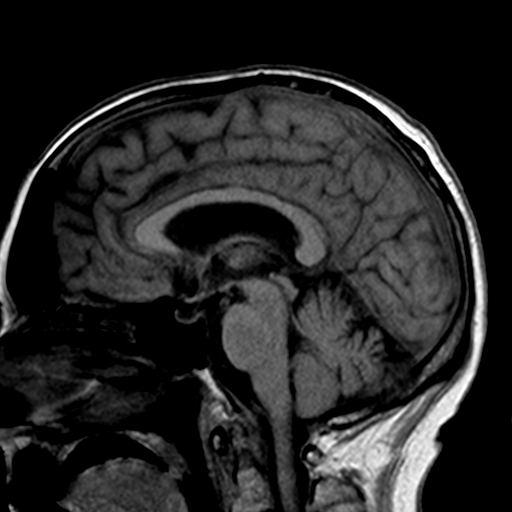
[im 20/20]
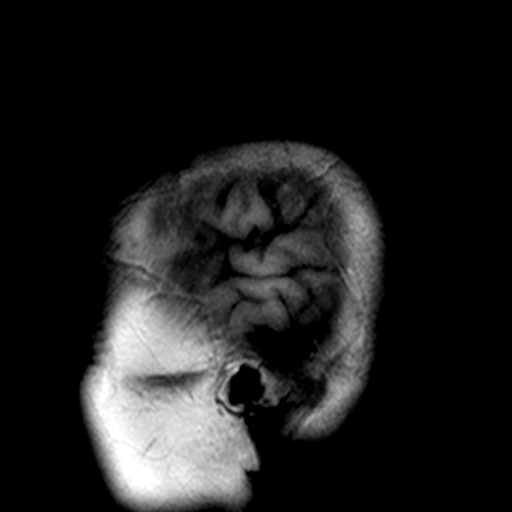

[Series 6: T2 · axial · 5.0mm · 0.75mm/px · z∈[-58,+84]mm · 4 of 23 slices shown (1 of 2)]
[im 1/23]
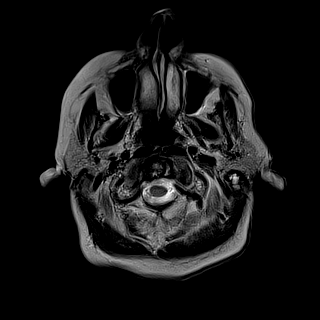
[im 8/23]
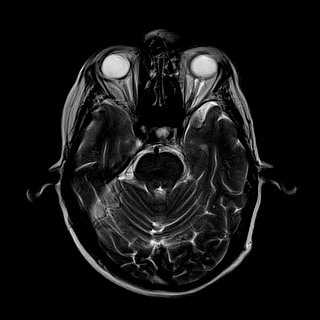
[im 15/23]
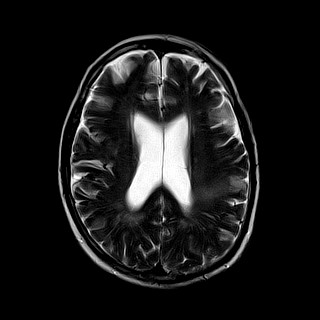
[im 23/23]
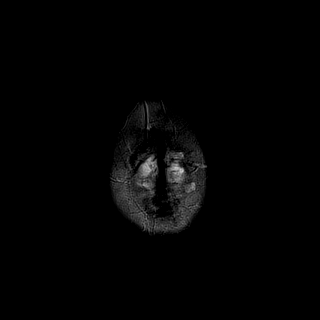

[Series 7: FLAIR · axial · 5.0mm · 0.94mm/px · z∈[-58,+84]mm · 3 of 23 slices shown]
[im 1/23]
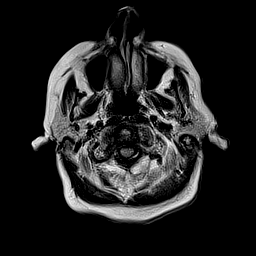
[im 12/23]
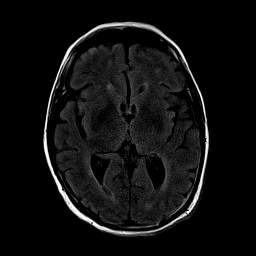
[im 23/23]
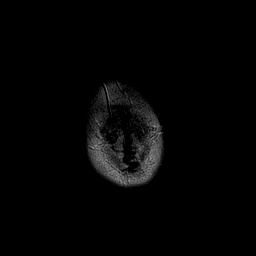

[Series 8: T1 · axial · 2.0mm · 0.47mm/px · z∈[-73,+112]mm · 11 of 95 slices shown]
[im 1/95]
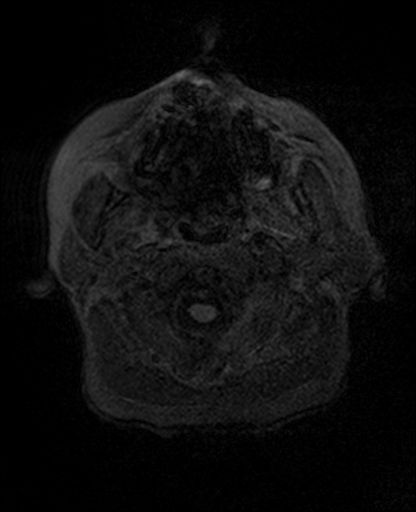
[im 10/95]
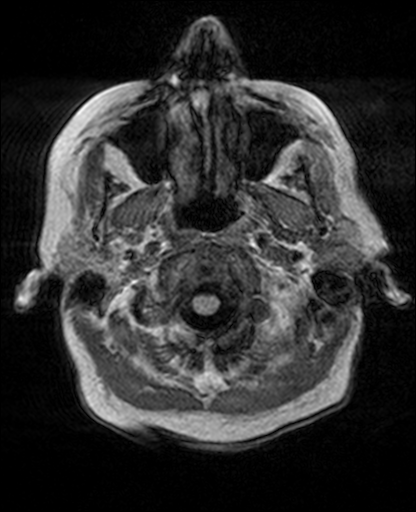
[im 19/95]
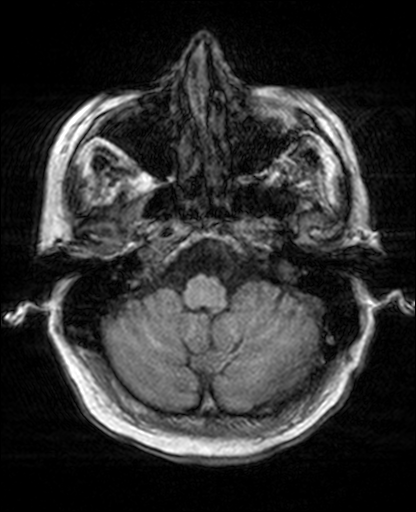
[im 29/95]
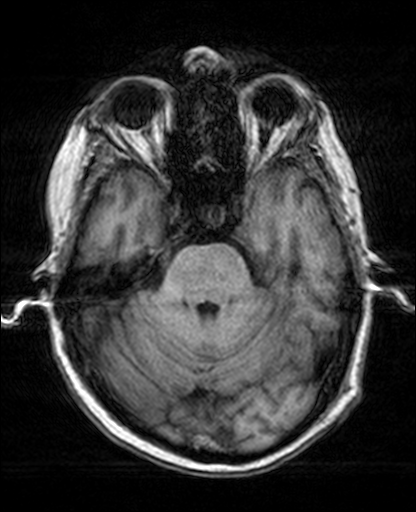
[im 38/95]
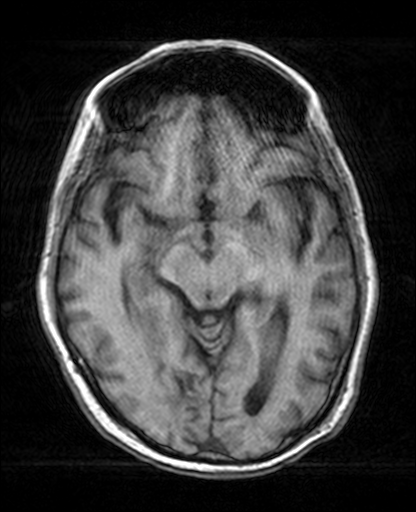
[im 48/95]
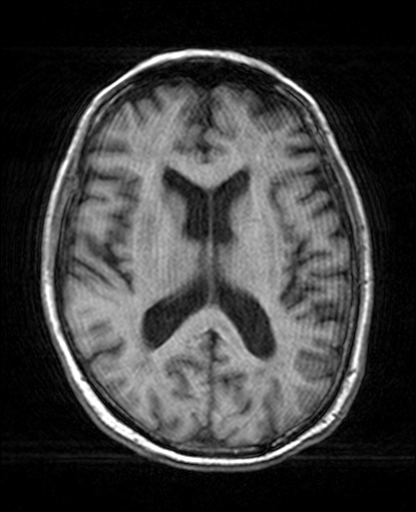
[im 57/95]
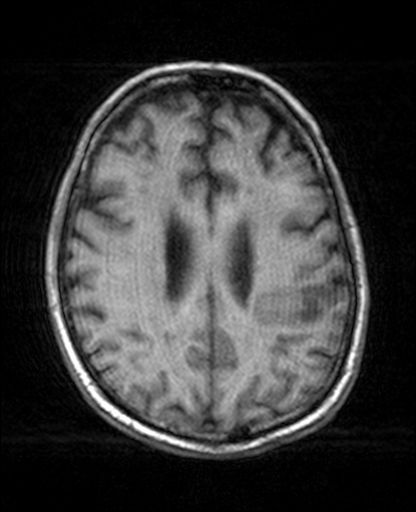
[im 66/95]
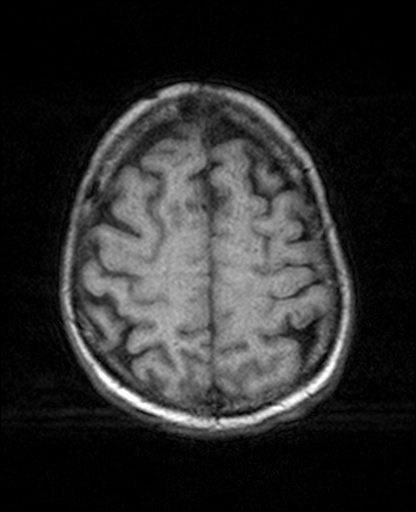
[im 76/95]
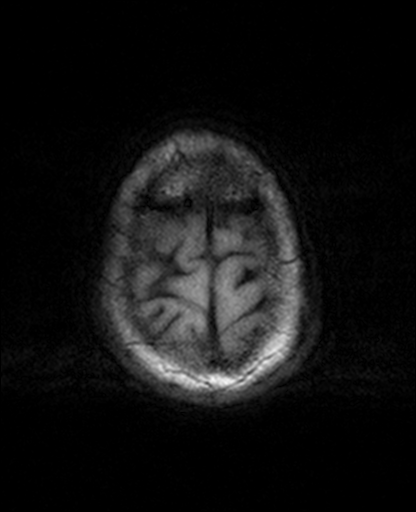
[im 85/95]
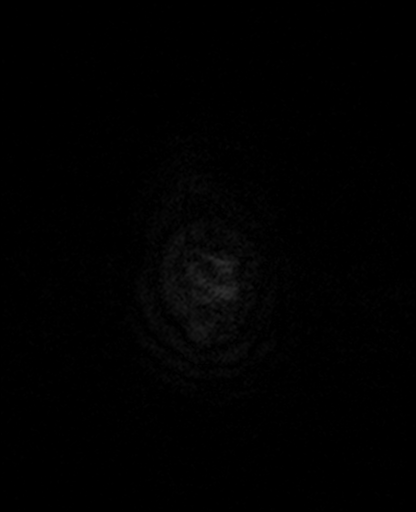
[im 95/95]
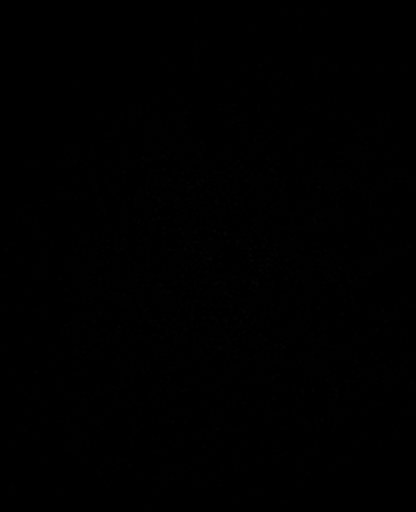

[Series 9: trauma axial · axial · 5.0mm · 0.45mm/px · z∈[-52,+76]mm · 2 of 21 slices shown]
[im 1/21]
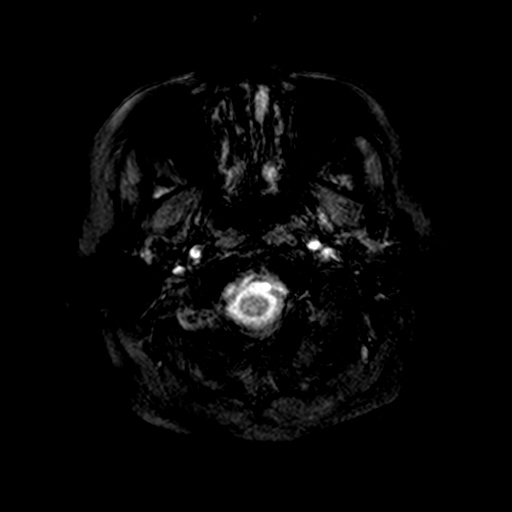
[im 21/21]
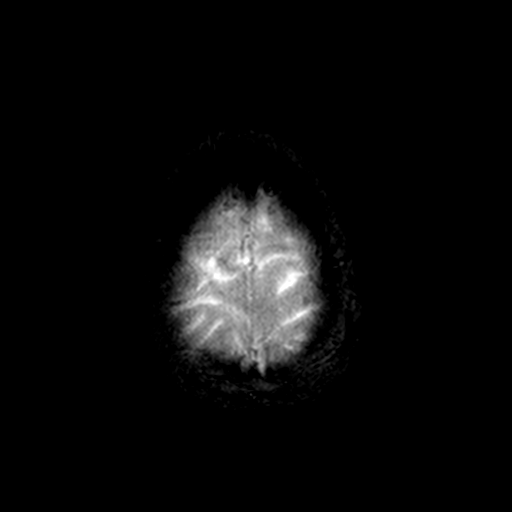

[Series 10: T2 · coronal · 5.0mm · 0.66mm/px · 3 of 28 slices shown (2 of 2)]
[im 1/28]
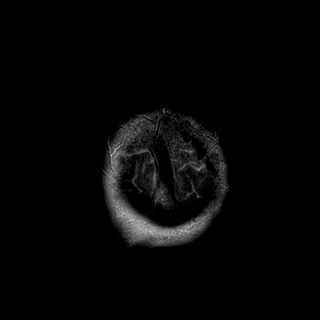
[im 14/28]
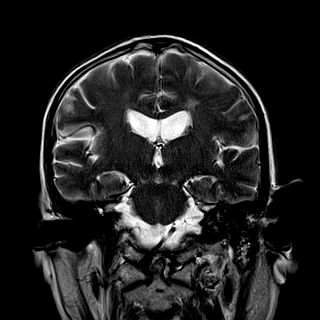
[im 28/28]
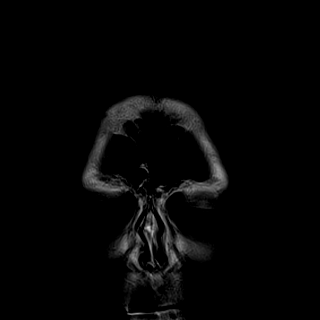

[Series 100: <mpr thick range> · axial · 3.0mm · 0.82mm/px · z∈[-51,+47]mm · 4 of 45 slices shown]
[im 1/45]
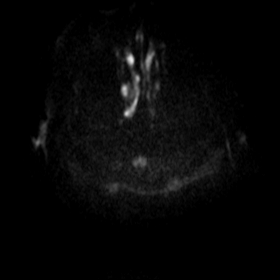
[im 12/45]
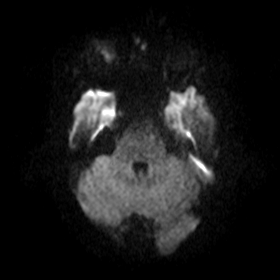
[im 23/45]
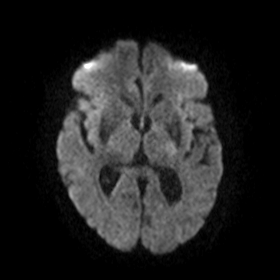
[im 34/45]
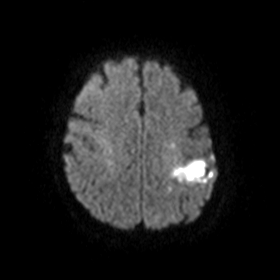

[30 of 48 positions shown; findings below may reference images not displayed]

FINDINGS: The diffusion-weighted images confirm an acute nonhemorrhagic
infarct involving the left precentral and postcentral gyrus. Two
punctate nonhemorrhagic infarcts are evident within the left
cerebellum. No other acute infarct is evident.

Remote lacunar infarcts are present within the basal ganglia
bilaterally including the right caudate head. Bilateral
periventricular and scattered subcortical T2 changes are present
bilaterally. A remote medial right occipital infarct is evident.
White matter changes extend into the brainstem.

The internal auditory canal is within normal limits bilaterally.
Flow is present in the major intracranial arteries. Bilateral lens
replacements are present. The globes and orbits are otherwise
intact.

Mild mucosal thickening is present in the left maxillary sinus and
scattered throughout the ethmoid air cells. The paranasal sinuses
are otherwise clear. A left mastoid effusion is present. No
obstructing nasopharyngeal lesion is evident.
IMPRESSION: 1. Nonhemorrhagic left MCA acute/subacute infarct involving the pre
and post central gyrus.
2. Acute nonhemorrhagic punctate infarcts in the left cerebellum.
3. Remote nonhemorrhagic infarct of the medial right occipital lobe.
4. Age advanced atrophy and white matter disease, likely reflecting
the sequela of chronic microvascular ischemia.

## 2016-08-13 IMAGING — CR DG ABD PORTABLE 1V
1 series · 1 of 1 positions shown · non-contrast
Comparison: CT 10/19/2015

CLINICAL DATA: Encounter for OG tube placement.

EXAM:
PORTABLE ABDOMEN - 1 VIEW

[AP]
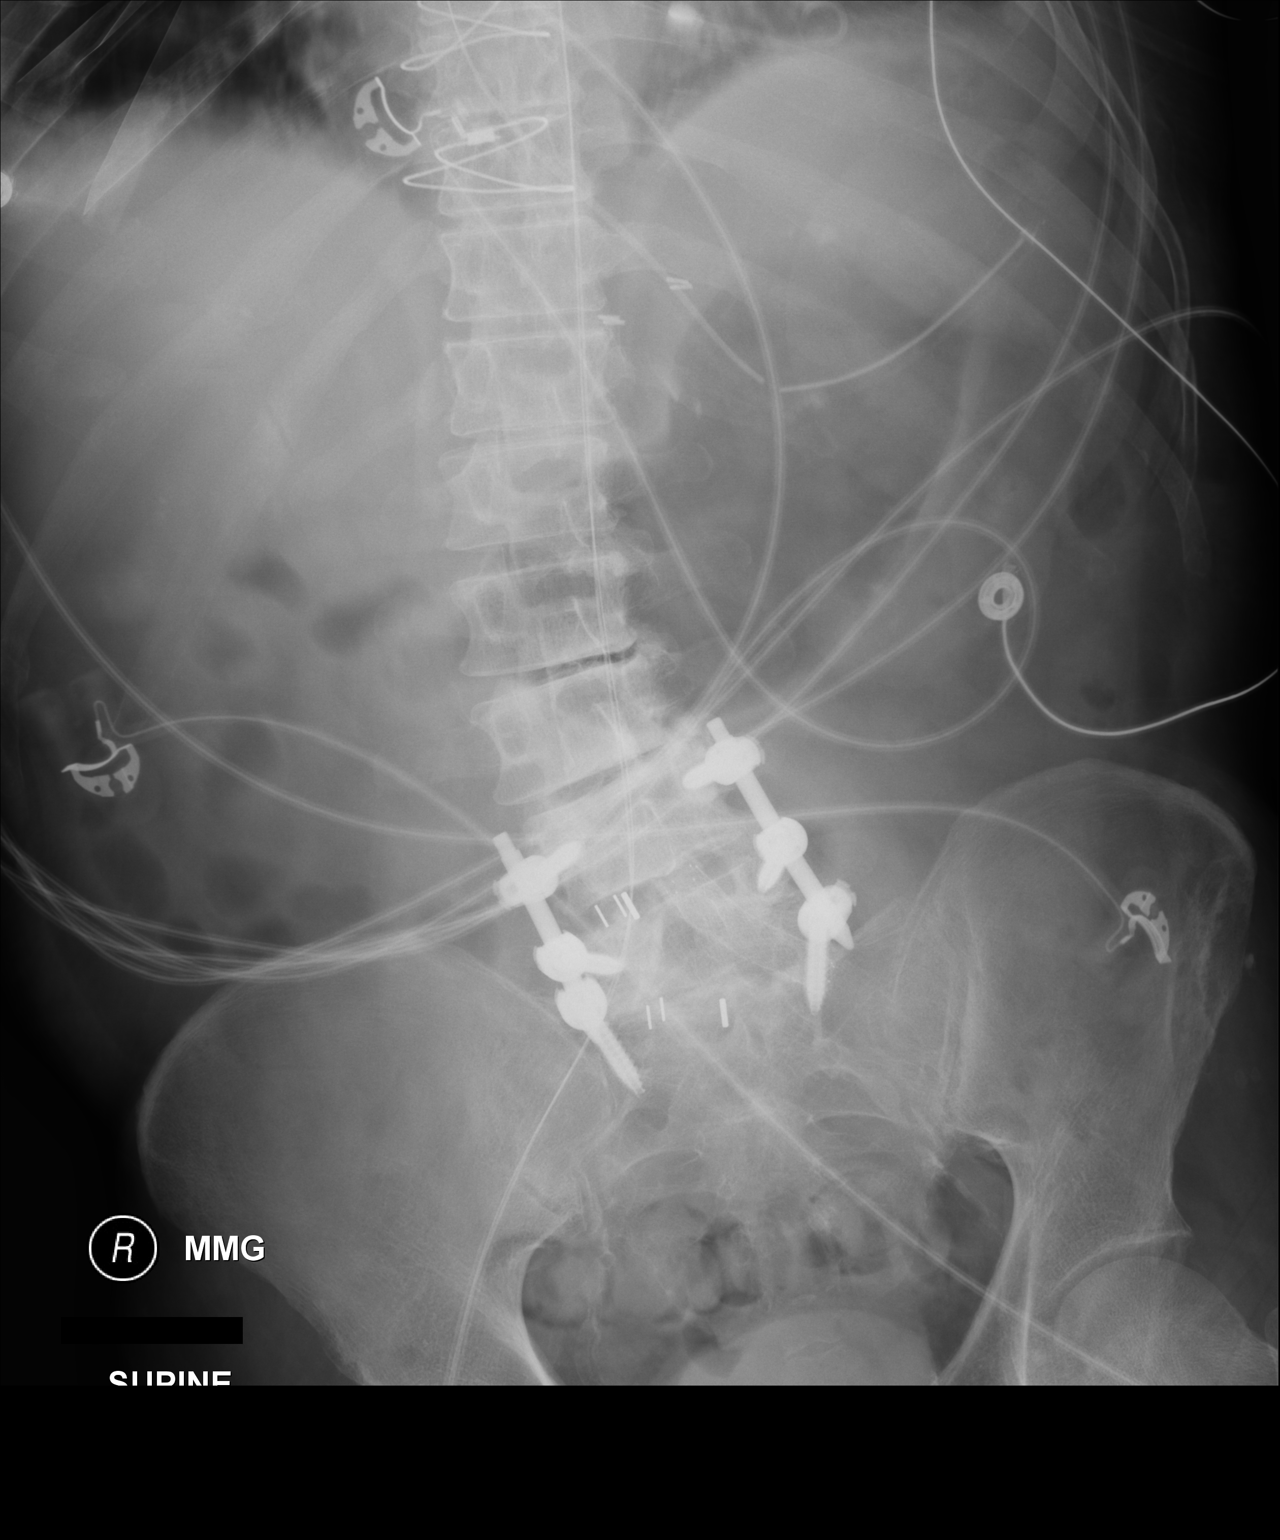

[1 of 1 positions shown; findings below may reference images not displayed]

FINDINGS: Tip and side port of the enteric tube below the diaphragm in the
stomach. Mild gaseous gastric distention. No small bowel dilatation.
Scoliotic curvature of the spine with posterior fusion hardware at
the lumbosacral junction. Posterior right rib fractures seen.
IMPRESSION: Tip and side port of the enteric tube below the diaphragm in the
stomach.

## 2016-08-13 IMAGING — CR DG CHEST 1V PORT
1 series · 1 of 1 positions shown · non-contrast
Comparison: Chest radiograph performed 10/19/2015

CLINICAL DATA: Endotracheal tube placement.  Initial encounter.

EXAM:
PORTABLE CHEST 1 VIEW

[AP]
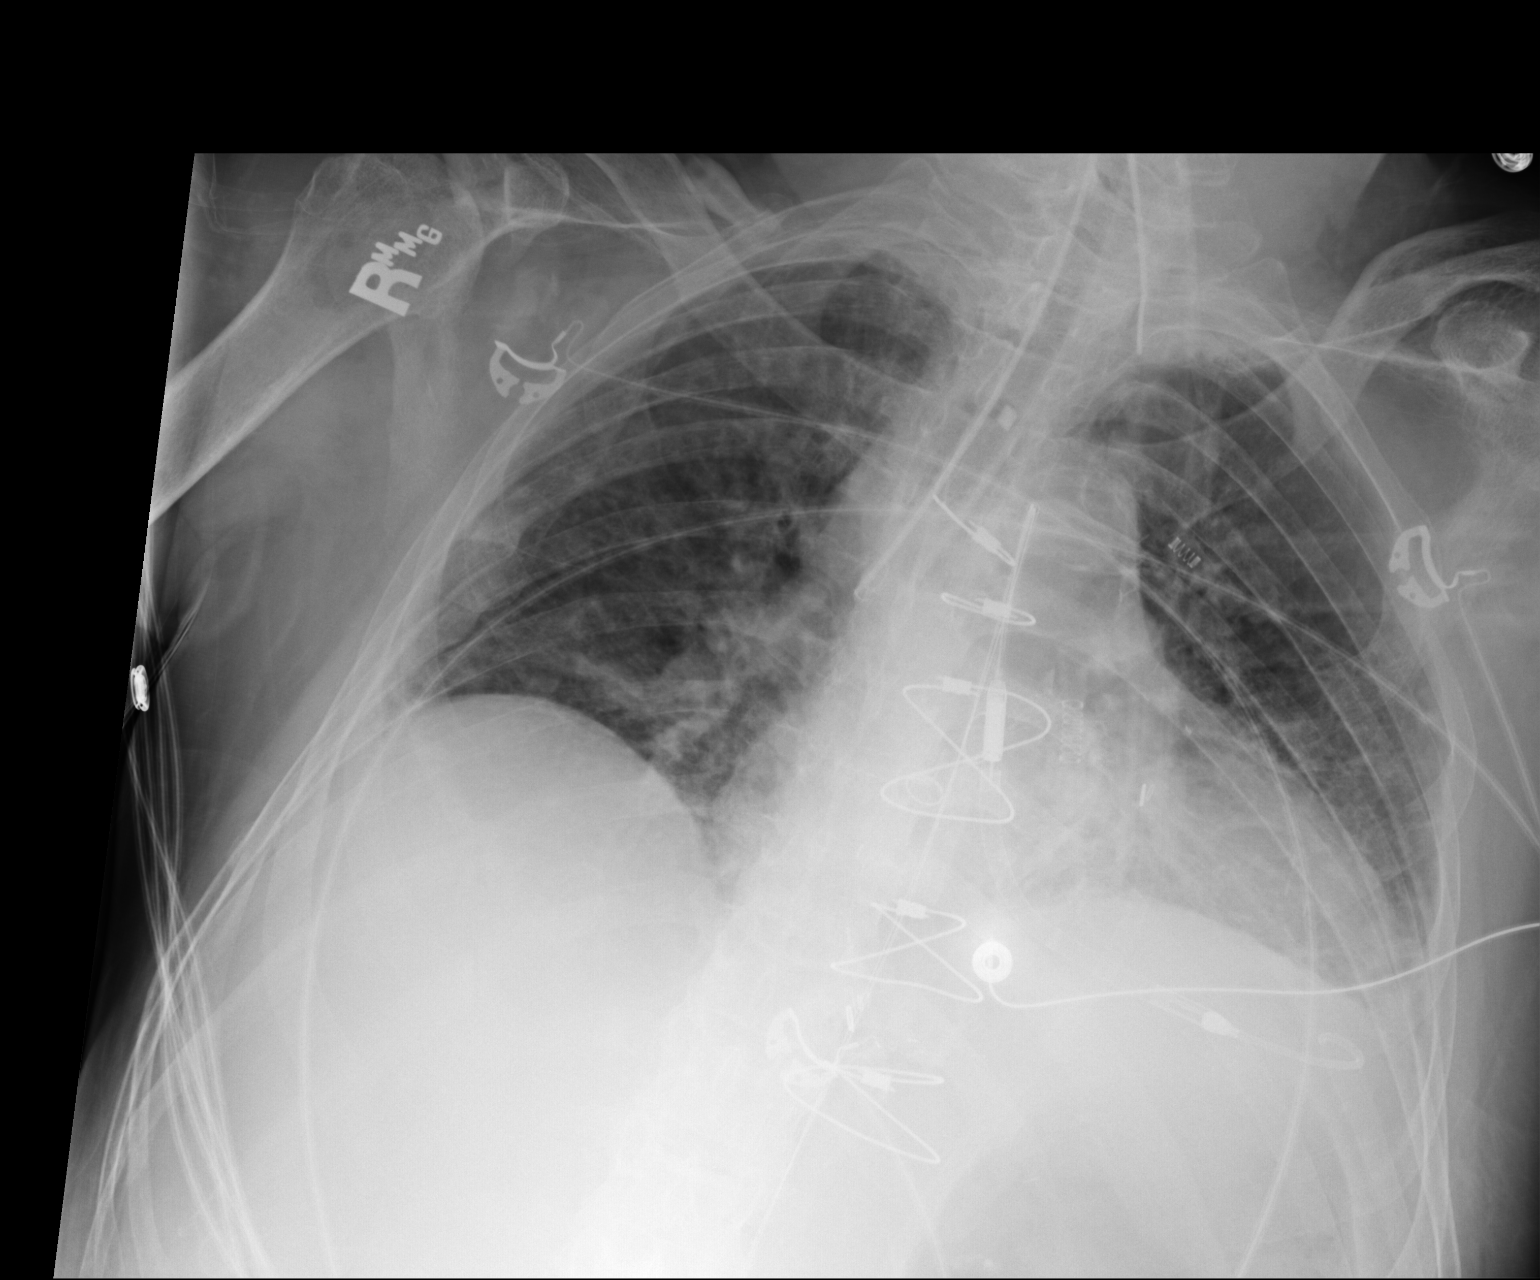

[1 of 1 positions shown; findings below may reference images not displayed]

FINDINGS: The patient's endotracheal tube is noted ending overlying the right
mainstem bronchus, 2 cm below the carina. This should be retracted
5-6 cm. The balloon also appears slightly overinflated.

The lungs are hypoexpanded. Bibasilar opacities may reflect
atelectasis or possibly mild infection. No definite pleural effusion
or pneumothorax is seen.

The cardiomediastinal silhouette is borderline enlarged. A catheter
is noted overlying the heart. The patient is status post median
sternotomy. There has been interval healing of the right lower
lateral rib fracture. No acute osseous abnormalities are identified.
IMPRESSION: 1. Endotracheal tube seen ending overlying the right mainstem
bronchus, 2 cm below the carina. This should be retracted 5-6 cm.
The balloon also appears slightly overinflated. This could be
deflated slightly.
2. Lungs hypoexpanded. Bibasilar airspace opacities may reflect
atelectasis or possibly mild infection.
3. Borderline cardiomegaly.

These results were called by telephone at the time of interpretation
on 11/14/2015 at [DATE] to [place] RN on [id number], who verbally
acknowledged these results.

## 2016-08-14 IMAGING — CR DG CHEST 1V PORT
1 series · 1 of 1 positions shown · non-contrast
Comparison: Chest radiograph performed 11/14/2015

CLINICAL DATA: Central line placement.  Initial encounter.

EXAM:
PORTABLE CHEST 1 VIEW

[AP]
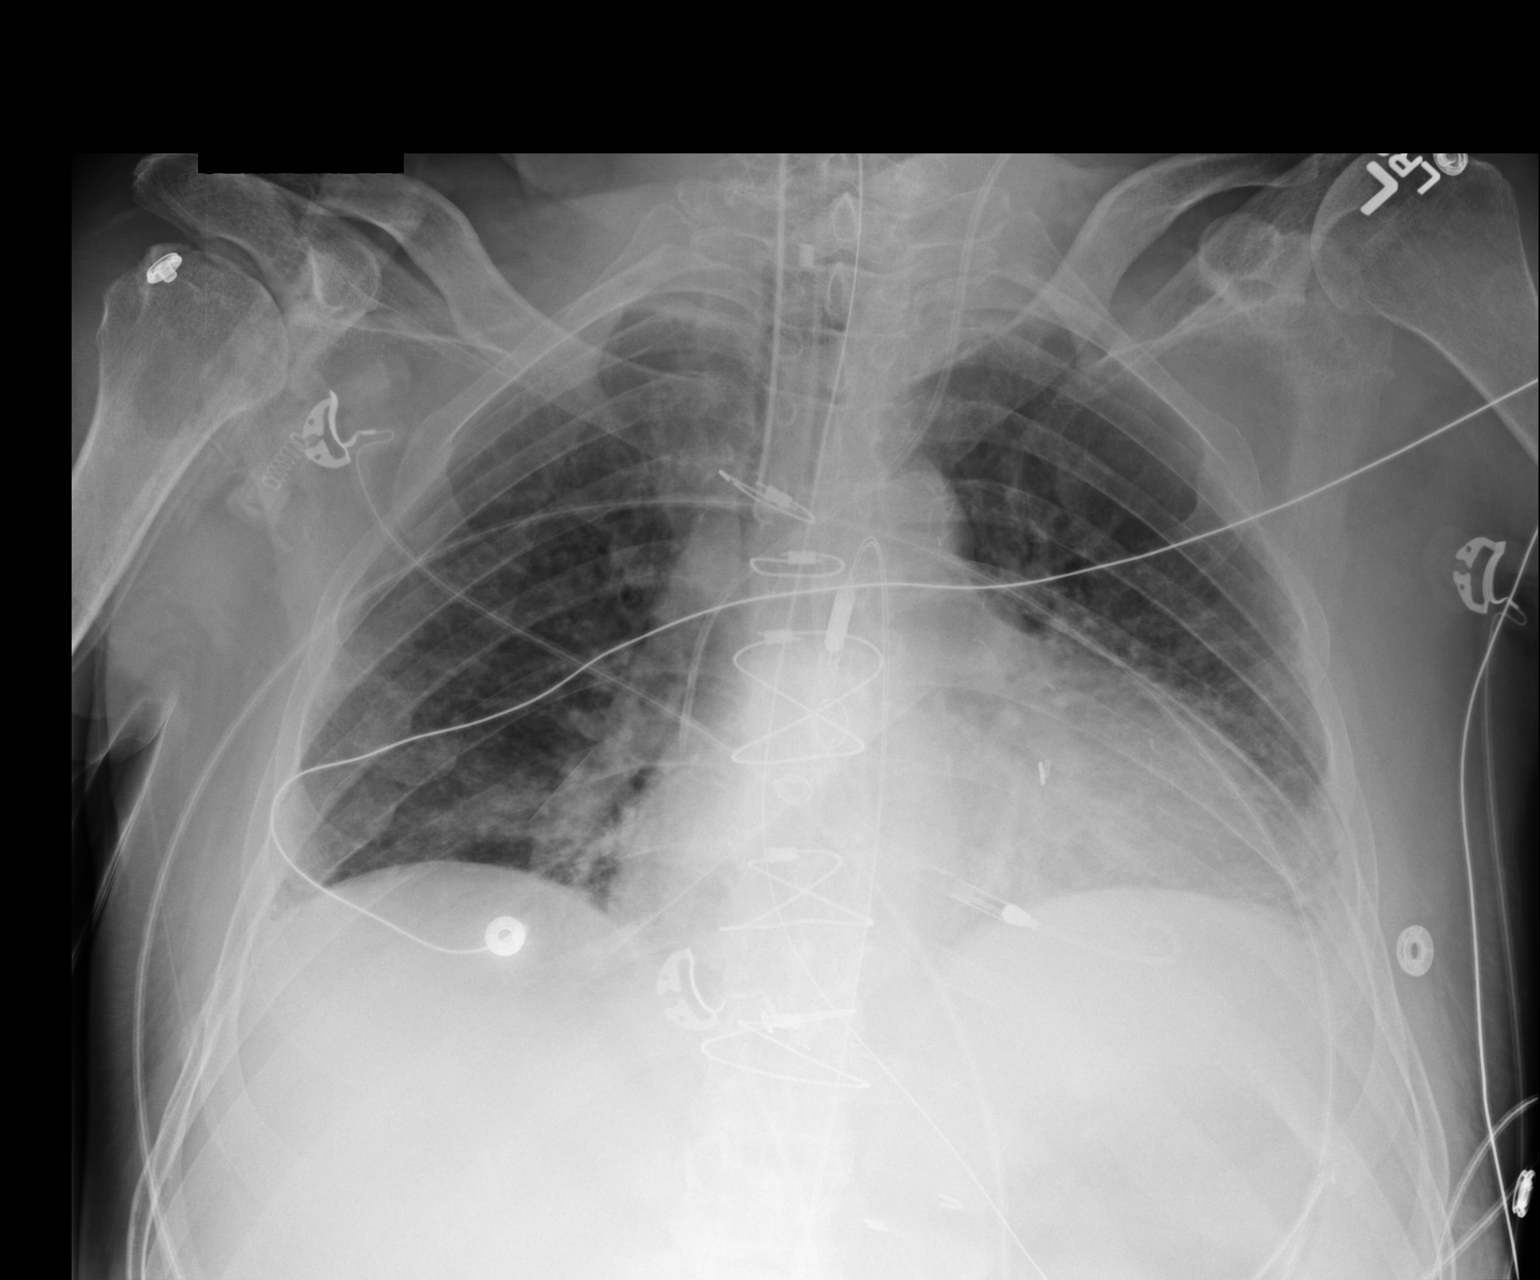

[1 of 1 positions shown; findings below may reference images not displayed]

FINDINGS: The patient's endotracheal tube is seen ending 2-3 cm above the
carina. An enteric tube is noted extending below the diaphragm. A
left IJ line is noted ending about the mid to distal SVC.

The lungs are hypoexpanded. Mild bibasilar opacities may reflect
atelectasis or possibly mild pneumonia. A small left pleural
effusion is suspected. No pneumothorax is seen.

The cardiomediastinal silhouette is mildly enlarged. The patient is
status post median sternotomy. A ventricular catheter is noted. No
acute osseous abnormalities are identified.
IMPRESSION: 1. Endotracheal tube seen ending 2-3 cm above the carina.
2. Left IJ line noted ending about the mid to distal SVC.
3. Lungs hypoexpanded. Mild bibasilar airspace opacities may reflect
atelectasis or possibly mild pneumonia. Suspect small left pleural
effusion.
4. Mild cardiomegaly.
